# Patient Record
Sex: Female | Born: 1955 | Race: White | Hispanic: No | Smoking: Never smoker
Health system: Southern US, Community
[De-identification: ages and names within clinical notes are randomized; demographics above are authoritative.]

## PROBLEM LIST (undated history)

## (undated) DIAGNOSIS — M7072 Other bursitis of hip, left hip: Secondary | ICD-10-CM

## (undated) DIAGNOSIS — D219 Benign neoplasm of connective and other soft tissue, unspecified: Secondary | ICD-10-CM

## (undated) DIAGNOSIS — I493 Ventricular premature depolarization: Secondary | ICD-10-CM

## (undated) DIAGNOSIS — C8590 Non-Hodgkin lymphoma, unspecified, unspecified site: Secondary | ICD-10-CM

## (undated) DIAGNOSIS — K219 Gastro-esophageal reflux disease without esophagitis: Secondary | ICD-10-CM

## (undated) DIAGNOSIS — G4730 Sleep apnea, unspecified: Secondary | ICD-10-CM

## (undated) DIAGNOSIS — T7840XA Allergy, unspecified, initial encounter: Secondary | ICD-10-CM

## (undated) HISTORY — DX: Gastro-esophageal reflux disease without esophagitis: K21.9

## (undated) HISTORY — DX: Sleep apnea, unspecified: G47.30

## (undated) HISTORY — PX: WISDOM TOOTH EXTRACTION: SHX21

## (undated) HISTORY — DX: Ventricular premature depolarization: I49.3

## (undated) HISTORY — DX: Benign neoplasm of connective and other soft tissue, unspecified: D21.9

## (undated) HISTORY — DX: Other bursitis of hip, left hip: M70.72

## (undated) HISTORY — DX: Allergy, unspecified, initial encounter: T78.40XA

---

## 1997-12-20 ENCOUNTER — Ambulatory Visit (HOSPITAL_COMMUNITY): Admission: RE | Admit: 1997-12-20 | Discharge: 1997-12-20 | Payer: Self-pay | Admitting: Gastroenterology

## 1998-05-15 HISTORY — PX: TOTAL ABDOMINAL HYSTERECTOMY: SHX209

## 1998-05-28 ENCOUNTER — Inpatient Hospital Stay (HOSPITAL_COMMUNITY): Admission: RE | Admit: 1998-05-28 | Discharge: 1998-05-30 | Payer: Self-pay | Admitting: Obstetrics and Gynecology

## 1999-02-03 ENCOUNTER — Other Ambulatory Visit: Admission: RE | Admit: 1999-02-03 | Discharge: 1999-02-03 | Payer: Self-pay | Admitting: Family Medicine

## 1999-12-29 ENCOUNTER — Other Ambulatory Visit: Admission: RE | Admit: 1999-12-29 | Discharge: 1999-12-29 | Payer: Self-pay | Admitting: *Deleted

## 1999-12-29 ENCOUNTER — Encounter (INDEPENDENT_AMBULATORY_CARE_PROVIDER_SITE_OTHER): Payer: Self-pay

## 2003-08-14 ENCOUNTER — Encounter: Admission: RE | Admit: 2003-08-14 | Discharge: 2003-08-14 | Payer: Self-pay | Admitting: Family Medicine

## 2005-01-28 ENCOUNTER — Encounter (INDEPENDENT_AMBULATORY_CARE_PROVIDER_SITE_OTHER): Payer: Self-pay | Admitting: Specialist

## 2005-01-28 ENCOUNTER — Ambulatory Visit (HOSPITAL_COMMUNITY): Admission: RE | Admit: 2005-01-28 | Discharge: 2005-01-28 | Payer: Self-pay | Admitting: Gastroenterology

## 2006-03-15 ENCOUNTER — Ambulatory Visit: Payer: Self-pay | Admitting: Family Medicine

## 2006-04-27 ENCOUNTER — Ambulatory Visit: Payer: Self-pay | Admitting: Family Medicine

## 2006-09-20 ENCOUNTER — Ambulatory Visit: Payer: Self-pay | Admitting: Family Medicine

## 2006-10-04 ENCOUNTER — Ambulatory Visit: Payer: Self-pay | Admitting: Family Medicine

## 2006-10-18 ENCOUNTER — Ambulatory Visit: Payer: Self-pay

## 2007-04-13 ENCOUNTER — Ambulatory Visit: Payer: Self-pay | Admitting: Family Medicine

## 2007-05-11 HISTORY — PX: OTHER SURGICAL HISTORY: SHX169

## 2008-06-11 ENCOUNTER — Ambulatory Visit: Payer: Self-pay | Admitting: Family Medicine

## 2008-07-03 ENCOUNTER — Ambulatory Visit: Payer: Self-pay | Admitting: Family Medicine

## 2009-08-12 ENCOUNTER — Ambulatory Visit: Payer: Self-pay | Admitting: Physician Assistant

## 2009-10-14 ENCOUNTER — Ambulatory Visit: Payer: Self-pay | Admitting: Family Medicine

## 2010-01-17 ENCOUNTER — Ambulatory Visit: Payer: Self-pay | Admitting: Family Medicine

## 2010-03-11 ENCOUNTER — Ambulatory Visit: Payer: Self-pay | Admitting: Family Medicine

## 2010-04-09 ENCOUNTER — Ambulatory Visit: Payer: Self-pay | Admitting: Family Medicine

## 2010-08-29 ENCOUNTER — Ambulatory Visit
Admission: RE | Admit: 2010-08-29 | Discharge: 2010-08-29 | Payer: Self-pay | Source: Home / Self Care | Attending: Family Medicine | Admitting: Family Medicine

## 2010-12-23 ENCOUNTER — Telehealth: Payer: Self-pay | Admitting: Family Medicine

## 2010-12-23 MED ORDER — ZOLPIDEM TARTRATE 10 MG PO TABS: 10.0000 mg | ORAL_TABLET | Freq: Every evening | ORAL | Status: DC | PRN

## 2010-12-23 NOTE — Telephone Encounter (Signed)
ambien given for sleep

## 2010-12-24 ENCOUNTER — Telehealth: Payer: Self-pay | Admitting: Family Medicine

## 2010-12-25 NOTE — Telephone Encounter (Signed)
Phone pt of Ambien refill.

## 2010-12-26 NOTE — Op Note (Signed)
[name], Denise [name]             ACCOUNT NO.:  [id number]   MEDICAL RECORD NO.:  [id number]          PATIENT TYPE:  AMB   LOCATION:  ENDO                         FACILITY:  [facility]   PHYSICIAN:  Anselmo Rod, M.D.  DATE OF BIRTH:  1956/02/07   DATE OF PROCEDURE:  01/28/2005  DATE OF DISCHARGE:                                 OPERATIVE REPORT   PROCEDURE PERFORMED:  Colonoscopy with cold biopsies times four.   ENDOSCOPIST:  Charna Elizabeth, M.D.   INSTRUMENT USED:  Olympus video colonoscope.   INDICATIONS FOR PROCEDURE:  The patient is a 55 year old white female with a  history of colon cancer in her family undergoing a screening colonoscopy to  rule out colonic polyps, masses, etc.   PREPROCEDURE PREPARATION:  Informed consent was procured from the patient.  The patient was fasted for eight hours prior to the procedure and prepped  with a bottle of magnesium citrate and a gallon of GoLYTELY the night prior  to the procedure.  The risks and benefits of the procedure including a 10%  miss rate for colon polyps or cancers was discussed with the patient as  well.  A gram of Ancef was given for prophylaxis purposes.   PREPROCEDURE PHYSICAL:  The patient had stable vital signs.  Neck supple.  Chest clear to auscultation.  S1 and S2 regular.  Abdomen soft with normal  bowel sounds.   DESCRIPTION OF PROCEDURE:  The patient was placed in left lateral decubitus  position and sedated with 70 mg of Demerol and 7 mg of Versed in slow  incremental doses.  Once the patient was adequately sedated and maintained  on low flow oxygen and continuous cardiac monitoring, the Olympus video  colonoscope was advanced from the rectum.  The appendicular orifice and  ileocecal valve were clearly visualized and photographed.  No masses,  polyps, erosions, ulcerations or diverticula were seen.  The terminal ileum  appeared healthy and without lesions.  A small superficial ulceration was  seen over the  ileocecal valve that was biopsied for pathology.  This may be  related to nonsteroidal injury.  Cold biopsies times four were done.  Retroflexion in the rectum revealed no abnormalities.  The patient tolerated  the procedure well without complication.   IMPRESSION:  Normal colonoscopy up to the terminal ileum except for a small  superficial ulcer over the ileocecal valve, biopsies done, results pending,  question nonsteroidal injury.   RECOMMENDATIONS:  1.  A repeat colonoscopy has been recommended in the next five years unless      the biopsies reveal any abnormalities with regard to adenomatous change      or dysplasia.  2.  Await pathology results.  3.  Avoid all nonsteroidals for the next two weeks.  4.  Outpatient followup as need arises in the future.       JNM/MEDQ  D:  01/28/2005  T:  01/28/2005  Job:  [id number]   cc:   Sharlot Gowda, M.D.  [address]  [place], [place] [address]  Fax: [phone]   Laqueta Linden, M.D.  [address]  [place]  [place]  [place] [address]  Fax: [phone]

## 2010-12-31 ENCOUNTER — Telehealth: Payer: Self-pay | Admitting: Family Medicine

## 2010-12-31 NOTE — Telephone Encounter (Signed)
Faxed refill out  

## 2011-02-16 ENCOUNTER — Other Ambulatory Visit: Payer: Self-pay | Admitting: Family Medicine

## 2011-03-06 ENCOUNTER — Encounter: Payer: Self-pay | Admitting: Family Medicine

## 2011-04-02 ENCOUNTER — Ambulatory Visit (INDEPENDENT_AMBULATORY_CARE_PROVIDER_SITE_OTHER): Payer: BC Managed Care – PPO | Admitting: Family Medicine

## 2011-04-02 ENCOUNTER — Encounter: Payer: Self-pay | Admitting: Family Medicine

## 2011-04-02 VITALS — BP 116/76 | HR 60

## 2011-04-02 DIAGNOSIS — L089 Local infection of the skin and subcutaneous tissue, unspecified: Secondary | ICD-10-CM

## 2011-04-02 DIAGNOSIS — W540XXA Bitten by dog, initial encounter: Secondary | ICD-10-CM

## 2011-04-02 DIAGNOSIS — T07XXXA Unspecified multiple injuries, initial encounter: Secondary | ICD-10-CM

## 2011-04-02 DIAGNOSIS — T148XXA Other injury of unspecified body region, initial encounter: Secondary | ICD-10-CM

## 2011-04-02 DIAGNOSIS — Z23 Encounter for immunization: Secondary | ICD-10-CM

## 2011-04-02 MED ORDER — AMOXICILLIN-POT CLAVULANATE 875-125 MG PO TABS: 1.0000 | ORAL_TABLET | Freq: Two times a day (BID) | ORAL | Status: AC

## 2011-04-02 NOTE — Patient Instructions (Signed)
Take the antibiotic. If you're thumb gets worse with more swelling and pain, give me a call

## 2011-04-02 NOTE — Progress Notes (Signed)
  Subjective:    Patient ID: Denise [name], female    DOB: [birth year], 55 y.o.   MRN: [id number]  HPI She was bitten by a friend of hers a dog last night and sustained laceration to the base of the left thumb on the palmar surface. The dog apparently does have his shots.  Review of Systems     Objective:   Physical Exam 3 puncture wounds are noted, one on the palmar surface into on the dorsal surface of her thumb. It is slightly erythematous but not warm and minimally tender.       Assessment & Plan:  Dog bite to thumb. I will give T. gallop and place her on Augmentin. Discussed possible problems with an infection on the palmar surface of the thumb. She will call if the symptoms worsen. Flu shot also given

## 2011-08-20 ENCOUNTER — Encounter: Payer: Self-pay | Admitting: Family Medicine

## 2011-08-20 ENCOUNTER — Ambulatory Visit (INDEPENDENT_AMBULATORY_CARE_PROVIDER_SITE_OTHER): Payer: BC Managed Care – PPO | Admitting: Family Medicine

## 2011-08-20 ENCOUNTER — Ambulatory Visit
Admission: RE | Admit: 2011-08-20 | Discharge: 2011-08-20 | Disposition: A | Discharge status: Home / Self Care | Payer: BC Managed Care – PPO | Source: Ambulatory Visit | Attending: Family Medicine | Admitting: Family Medicine

## 2011-08-20 DIAGNOSIS — M25559 Pain in unspecified hip: Secondary | ICD-10-CM

## 2011-08-20 DIAGNOSIS — M25552 Pain in left hip: Secondary | ICD-10-CM

## 2011-08-20 DIAGNOSIS — I4949 Other premature depolarization: Secondary | ICD-10-CM

## 2011-08-20 DIAGNOSIS — I493 Ventricular premature depolarization: Secondary | ICD-10-CM

## 2011-08-20 DIAGNOSIS — J309 Allergic rhinitis, unspecified: Secondary | ICD-10-CM

## 2011-08-20 DIAGNOSIS — Z79899 Other long term (current) drug therapy: Secondary | ICD-10-CM

## 2011-08-20 DIAGNOSIS — G4730 Sleep apnea, unspecified: Secondary | ICD-10-CM

## 2011-08-20 DIAGNOSIS — J45909 Unspecified asthma, uncomplicated: Secondary | ICD-10-CM

## 2011-08-20 DIAGNOSIS — K219 Gastro-esophageal reflux disease without esophagitis: Secondary | ICD-10-CM

## 2011-08-20 MED ORDER — ALBUTEROL SULFATE (2.5 MG/3ML) 0.083% IN NEBU: 2.5000 mg | INHALATION_SOLUTION | Freq: Four times a day (QID) | RESPIRATORY_TRACT | Status: DC | PRN

## 2011-08-20 MED ORDER — ESOMEPRAZOLE MAGNESIUM 40 MG PO CPDR: 40.0000 mg | DELAYED_RELEASE_CAPSULE | Freq: Every day | ORAL | Status: DC

## 2011-08-20 NOTE — Progress Notes (Signed)
  Subjective:    Patient ID: Denise [name], female    DOB: 06-04-1956, 56 y.o.   MRN: [id number]  HPI She is here for medication recheck. She does have reflux disease. She has tried several H2 blockers with minimal success and finds good results with Nexium. She gets this in [place]. She also has underlying allergies and presently is getting shots. She notes that it also helps with her asthma. She uses Proventil approximately twice per month. 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 She has no difficulty with that. She continues on CPAP and has not had a read out. She does complain of intermittent left hip pain and sometimes causes her to limp. 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 She has several more years before she can consider retiring. She is in a long-term relationship   Review of Systems Negative except as above    Objective:   Physical Exam Alert and in no distress. No tenderness to palpation over the left greater trochanter. Some pain on internal rotation of the hip. External rotation was nonpainful.       Assessment & Plan:   1. Left hip pain  DG Hip Complete Left  2. GERD (gastroesophageal reflux disease)    3. Asthma    4. Allergic rhinitis    5. Sleep apnea    6. PVC's (premature ventricular contractions)  CBC with Differential, Comprehensive metabolic panel, Lipid panel  7. Encounter for long-term (current) use of other medications  CBC with Differential, Comprehensive metabolic panel, Lipid panel   prescription written for Proventil and Nexium that she will send to [place]. Recommend she find out from her insurance whether a different PPI will be covered. Will also get a read out.

## 2011-08-20 NOTE — Patient Instructions (Signed)
I will call you with the results of the x-ray and what the results of the CPAP readout

## 2011-08-21 LAB — CBC WITH DIFFERENTIAL/PLATELET
Basophils Absolute: 0 10*3/uL (ref 0.0–0.1)
Eosinophils Relative: 3 % (ref 0–5)
HCT: 41.8 % (ref 36.0–46.0)
Lymphocytes Relative: 33 % (ref 12–46)
MCV: 84.1 fL (ref 78.0–100.0)
Monocytes Absolute: 0.5 10*3/uL (ref 0.1–1.0)
RDW: 13.8 % (ref 11.5–15.5)
WBC: 6.9 10*3/uL (ref 4.0–10.5)

## 2011-08-21 LAB — LIPID PANEL
Cholesterol: 185 mg/dL (ref 0–200)
HDL: 53 mg/dL (ref >39)
Total CHOL/HDL Ratio: 3.5 Ratio

## 2011-08-21 LAB — COMPREHENSIVE METABOLIC PANEL
BUN: 12 mg/dL (ref 6–23)
CO2: 28 mEq/L (ref 19–32)
Calcium: 9.2 mg/dL (ref 8.4–10.5)
Chloride: 103 mEq/L (ref 96–112)
Creat: 0.67 mg/dL (ref 0.50–1.10)

## 2011-08-25 ENCOUNTER — Telehealth: Payer: Self-pay | Admitting: Family Medicine

## 2011-08-25 MED ORDER — ZOLPIDEM TARTRATE 10 MG PO TABS: 10.0000 mg | ORAL_TABLET | Freq: Every evening | ORAL | Status: DC | PRN

## 2011-08-25 MED ORDER — MELOXICAM 15 MG PO TABS: 15.0000 mg | ORAL_TABLET | Freq: Every day | ORAL | Status: DC

## 2011-08-25 MED ORDER — ATENOLOL 50 MG PO TABS: 50.0000 mg | ORAL_TABLET | Freq: Every day | ORAL | Status: DC

## 2011-08-25 NOTE — Telephone Encounter (Signed)
Called in [facility] and refilled meds

## 2011-08-25 NOTE — Telephone Encounter (Signed)
Renew all of these medications. Give her Ambien with a refill as per orders written

## 2011-09-01 ENCOUNTER — Other Ambulatory Visit: Payer: Self-pay | Admitting: Family Medicine

## 2012-01-06 ENCOUNTER — Other Ambulatory Visit: Payer: Self-pay | Admitting: Family Medicine

## 2012-01-06 NOTE — Telephone Encounter (Signed)
Is this ok?

## 2012-02-03 ENCOUNTER — Encounter: Payer: Self-pay | Admitting: Internal Medicine

## 2012-04-26 ENCOUNTER — Telehealth: Payer: Self-pay | Admitting: Internal Medicine

## 2012-04-26 NOTE — Telephone Encounter (Signed)
Pt had a change in med to atenolol

## 2012-05-03 ENCOUNTER — Telehealth: Payer: Self-pay | Admitting: Family Medicine

## 2012-05-03 MED ORDER — HYDROCHLOROTHIAZIDE 12.5 MG PO TABS: 12.5000 mg | ORAL_TABLET | Freq: Every day | ORAL | Status: DC

## 2012-05-03 NOTE — Telephone Encounter (Signed)
I SPOKE WITH THIS PATIENT AND I SENT IN A 30 DAY SUPPLY ON THE MEDICATION. CLS

## 2012-06-21 ENCOUNTER — Other Ambulatory Visit: Payer: Self-pay | Admitting: Family Medicine

## 2012-06-29 ENCOUNTER — Other Ambulatory Visit: Payer: Self-pay | Admitting: Family Medicine

## 2012-08-13 ENCOUNTER — Ambulatory Visit (INDEPENDENT_AMBULATORY_CARE_PROVIDER_SITE_OTHER): Payer: BC Managed Care – PPO | Admitting: Radiology

## 2012-08-13 DIAGNOSIS — Z23 Encounter for immunization: Secondary | ICD-10-CM

## 2012-08-22 ENCOUNTER — Other Ambulatory Visit: Payer: Self-pay | Admitting: Family Medicine

## 2012-09-02 ENCOUNTER — Ambulatory Visit (INDEPENDENT_AMBULATORY_CARE_PROVIDER_SITE_OTHER): Payer: BC Managed Care – PPO | Admitting: Family Medicine

## 2012-09-02 ENCOUNTER — Encounter: Payer: Self-pay | Admitting: Family Medicine

## 2012-09-02 VITALS — BP 120/80 | HR 70 | Wt 209.0 lb

## 2012-09-02 DIAGNOSIS — J45909 Unspecified asthma, uncomplicated: Secondary | ICD-10-CM

## 2012-09-02 DIAGNOSIS — K219 Gastro-esophageal reflux disease without esophagitis: Secondary | ICD-10-CM

## 2012-09-02 DIAGNOSIS — G4730 Sleep apnea, unspecified: Secondary | ICD-10-CM

## 2012-09-02 DIAGNOSIS — J309 Allergic rhinitis, unspecified: Secondary | ICD-10-CM

## 2012-09-02 MED ORDER — MOMETASONE FUROATE 110 MCG/INH IN AEPB: 1.0000 "application " | INHALATION_SPRAY | RESPIRATORY_TRACT | Status: DC

## 2012-09-02 MED ORDER — FLUTICASONE PROPIONATE 50 MCG/ACT NA SUSP: 2.0000 | Freq: Every day | NASAL | Status: DC

## 2012-09-02 MED ORDER — ESOMEPRAZOLE MAGNESIUM 40 MG PO CPDR: 40.0000 mg | DELAYED_RELEASE_CAPSULE | Freq: Every day | ORAL | Status: DC

## 2012-09-02 NOTE — Patient Instructions (Signed)
Use this medicine in the spring and fall when you have problems but you don't really need to use it regularly

## 2012-09-02 NOTE — Progress Notes (Signed)
  Subjective:    Patient ID: Denise [name], female    DOB: 08-30-1955, 57 y.o.   MRN: [id number]  HPI She is here for an interval evaluation. She does have underlying sleep apnea and does have an old machine the cannot get a read out. She would like to be able to get a new machine. She also has asthma but does intermittently use an inhaler. She mainly has trouble when she gets cold symptoms, change of season also in the spring and fall. She does have underlying allergies and does have some nasal congestion. She has used fluticasone in the past. She also has reflux symptoms and his Nexium usually out of [place]. She would like to fill on this. She works in the school system. She is in a 3 year relationship which is going quite well. In general her life is going for her well. Alert and in no distress otherwise not examined   Review of Systems     Objective:   Physical Exam        Assessment & Plan:   1. Sleep apnea    2. Asthma  Mometasone Furoate (ASMANEX 30 METERED DOSES) 110 MCG/INH AEPB  3. Allergic rhinitis  fluticasone (FLONASE) 50 MCG/ACT nasal spray  4. GERD (gastroesophageal reflux disease)  esomeprazole (NEXIUM) 40 MG capsule   will write for her to get a CPAP machine and supplies. She is to have it set up on auto CPAP. Recommend she use Asmanex usually in the spring and fall. She is to use the albuterol on an as-needed basis but. Recommend she use Flonase to help with her nasal symptoms. Will write for exam to be obtained from [place].

## 2012-09-29 ENCOUNTER — Telehealth: Payer: Self-pay | Admitting: Family Medicine

## 2012-09-29 ENCOUNTER — Other Ambulatory Visit: Payer: Self-pay

## 2012-09-29 MED ORDER — TRIAZOLAM 0.25 MG PO TABS: 0.2500 mg | ORAL_TABLET | Freq: Every evening | ORAL | Status: DC | PRN

## 2012-09-29 NOTE — Telephone Encounter (Signed)
Let her know that I called in a different medication. Call this one in for her.

## 2012-09-29 NOTE — Telephone Encounter (Signed)
Pt states she has never taken a full pill

## 2012-09-29 NOTE — Telephone Encounter (Signed)
Called med in per jcl and pt advised

## 2012-09-29 NOTE — Telephone Encounter (Signed)
Have her try one half a pill to see if this will help with sleep but not cause memory issues otherwise I will call something different

## 2012-09-29 NOTE — Telephone Encounter (Signed)
She would like to be switched to a different sleep med since Ambien causes memory loss.I will switch her to Halcion .

## 2012-10-04 ENCOUNTER — Encounter: Payer: Self-pay | Admitting: Family Medicine

## 2012-10-11 ENCOUNTER — Telehealth: Payer: Self-pay

## 2012-10-11 NOTE — Telephone Encounter (Signed)
Called pt she said to let you know she is juicing 2 meals a day and normal meal at dinner she said she knew what you were going to say she really likes you but she doesn't want to come in I told her of her labs she said all he is going to say is exercise and change diet .

## 2012-10-15 ENCOUNTER — Other Ambulatory Visit: Payer: Self-pay | Admitting: Family Medicine

## 2012-12-08 ENCOUNTER — Telehealth: Payer: Self-pay | Admitting: Nurse Practitioner

## 2012-12-08 NOTE — Telephone Encounter (Signed)
Letter of necessity needed for hormone patch/patient forgets name of rx/please fax letter to patient at [phone].

## 2012-12-09 NOTE — Telephone Encounter (Signed)
Patient called with different fax for letter of medical necessity to new fax number: [phone].

## 2012-12-09 NOTE — Telephone Encounter (Signed)
Spoke with pt about her request for a letter of medical necessity for Vivelle Dot Rx. Pt. Says medication to costly at local pharmacy, pt states she gets her medication from [place]. I explained to her that Alexia Freestone was out of the office today, so I spoke with my Clinical Supervisor(Sally) and she said we would not be able to write this letter for her. Patients response was why not! Everybody does it. Then she went on to say she would like to speak with Kennon Rounds if not today Monday will be ok.

## 2012-12-12 ENCOUNTER — Telehealth: Payer: Self-pay | Admitting: Nurse Practitioner

## 2012-12-12 NOTE — Telephone Encounter (Signed)
Follow up phone call to patient regarding [facility] use.  Advised that are office policy implemented by our physicians and supported by Alexia Freestone does not support the use of non-[place] pharmacy companies.  Quality assessment measures and FDA approval are not  In place for such medications and therefore we will not support their use.  Offered to assist patient in obtaining a lower cost option or least expensive pharmacy and advised that Vivelle has recently become available in generic.  Patient declined and states it is just so much cheaper to use the [facility] pharmacy.  She asked if we would have written letter if we had not known what it was for.  Advised that although we write Rx and dont necessarily know where she fills it, a LOM only written based on who it is to and generally that would be the insurance company.  Again, advised out office does not recommend/support the use of [facility].

## 2012-12-12 NOTE — Telephone Encounter (Signed)
Kennon Rounds this was sent to my basket and most likely to you as well.  But as you know we don't support use of out of country pharmacy.

## 2012-12-12 NOTE — Telephone Encounter (Signed)
Patient has a HSA account and wants reimbursement for her medication.  Needs to have diagnosis and Name of medication only.  She is aware that we do not advise [facility], but this is not a note for them - only her HSA account at work. Note is hand written for her HSA with diagnosis of menopause.

## 2012-12-12 NOTE — Telephone Encounter (Signed)
Patient is asking to talk with PG herself if possible. Patient did not give any details.

## 2013-01-11 ENCOUNTER — Encounter: Payer: Self-pay | Admitting: Medical

## 2013-01-11 ENCOUNTER — Ambulatory Visit (INDEPENDENT_AMBULATORY_CARE_PROVIDER_SITE_OTHER): Payer: BC Managed Care – PPO | Admitting: Medical

## 2013-01-11 VITALS — BP 110/70 | HR 58 | Temp 97.8°F | Resp 16 | Wt 205.0 lb

## 2013-01-11 DIAGNOSIS — M722 Plantar fascial fibromatosis: Secondary | ICD-10-CM

## 2013-01-11 DIAGNOSIS — J329 Chronic sinusitis, unspecified: Secondary | ICD-10-CM

## 2013-01-11 MED ORDER — AZITHROMYCIN 250 MG PO TABS: ORAL_TABLET | ORAL | Status: DC

## 2013-01-11 NOTE — Progress Notes (Signed)
Subjective:  Denise [name] is a 57 y.o. female who presents for possible sinusitis.  8 days ago started having sneezing, sore throat, productive cough, LGF, sinus pressure, fullness and popping in ears, worsening, some nausea.  Past history is significant for asthma. Patient is a non-smoker.  Using Zycam, Mucinex, Nyquil for symptoms.  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   Denies vomiting and diarrhea, on SOB, wheezing.  No other aggravating or relieving factors.    She notes hx/o plantar fascitis, bilat.  Yearly for the last few years has gotten molded orthotics that really seems to help her feet.  Has to have yearly prescription.  Requests the script today.  Dr. Susann [name] has prescribed prior.   Gets the orthotics made from company on [facility].    No other c/o.    Past Medical History  Diagnosis Date  . Asthma   . Allergy   . GERD (gastroesophageal reflux disease)   . Sleep apnea   . PVC's (premature ventricular contractions)    ROS as in subjective  Objective: Filed Vitals:   01/11/13 1603  BP: 110/70  Pulse: 58  Temp: 97.8 F (36.6 C)  Resp: 16    General appearance: Alert, WD/WN, no distress                             Skin: warm, no rash                           Head: +maxillary sinus tenderness                            Eyes: conjunctiva normal, corneas clear, PERRLA                            Ears: serous effusion behind both TMs, external ear canals normal                          Nose: septum midline, turbinates swollen, with erythema and clear discharge             Mouth/throat: MMM, tongue normal, mild pharyngeal erythema                           Neck: supple, no adenopathy, no thyromegaly, nontender                          Heart: RRR, normal S1, S2, no murmurs                         Lungs: CTA bilaterally, no wheezes, rales, or rhonchi   MSK: feet without deformity, nontender, normal toe and ankle ROM, no foot lesions LE neurovascularly  intact   Assessment and Plan:   Encounter Diagnoses  Name Primary?  . Sinusitis Yes  . Plantar fasciitis     Prescription given for Zpak, can c/t symptomatic relief, nasal saline, and call or return if worse or not improving in 2-3 days.    Plantar fascitis - scripts for orthotics as this seems to be a good solution for her

## 2013-01-18 ENCOUNTER — Other Ambulatory Visit: Payer: Self-pay | Admitting: Family Medicine

## 2013-02-20 ENCOUNTER — Encounter: Payer: Self-pay | Admitting: Internal Medicine

## 2013-03-02 ENCOUNTER — Telehealth: Payer: Self-pay | Admitting: Family Medicine

## 2013-03-02 NOTE — Telephone Encounter (Signed)
There is no documentation in computer from GI (no notes or OV's in system or scanned that I can find).  I don't see a visit where this was discussed with Dr. Susann Givens, so I can't really answer her question.  She should ask her GI doctor, or discuss with Dr. Susann Givens, if he was the one who referred her to GI or made any recommendations for her to have these studies.

## 2013-03-02 NOTE — Telephone Encounter (Signed)
Left message on patient's vm explaining Dr.Knapp's response to her phonecall.

## 2013-04-24 ENCOUNTER — Encounter: Payer: Self-pay | Admitting: Family Medicine

## 2013-05-01 ENCOUNTER — Other Ambulatory Visit: Payer: Self-pay | Admitting: Family Medicine

## 2013-06-15 ENCOUNTER — Ambulatory Visit: Payer: BC Managed Care – PPO | Admitting: Family Medicine

## 2013-07-03 ENCOUNTER — Telehealth: Payer: Self-pay

## 2013-07-03 NOTE — Telephone Encounter (Signed)
Pt was notified and will continue same med

## 2013-07-03 NOTE — Telephone Encounter (Signed)
Tell her that unfortunately all anti-inflammatory medications can cause fluid retention. The only option would be Tylenol. Tell her we can try a different one but she still might have the same problem.

## 2013-07-03 NOTE — Telephone Encounter (Signed)
PATIENT LEFT MESSAGE ASKING IF YOU WOULD PLEASE CHANGE HER MEDICINE FOR BURSITIS THE MED YOU GAVE HER MAKES HER ANKLE SWELL SHE SAID HOW SHE KNOW BECAUSE SHE STOPPED MED AND ANKLE WENT DOWN PLEASE ADVISE

## 2013-09-04 ENCOUNTER — Encounter: Payer: Self-pay | Admitting: Nurse Practitioner

## 2013-09-04 ENCOUNTER — Encounter: Payer: BC Managed Care – PPO | Admitting: Family Medicine

## 2013-09-04 ENCOUNTER — Ambulatory Visit (INDEPENDENT_AMBULATORY_CARE_PROVIDER_SITE_OTHER): Payer: BC Managed Care – PPO | Admitting: Nurse Practitioner

## 2013-09-04 ENCOUNTER — Other Ambulatory Visit: Payer: Self-pay | Admitting: Nurse Practitioner

## 2013-09-04 VITALS — BP 120/76 | HR 52 | Ht 64.25 in | Wt 208.0 lb

## 2013-09-04 DIAGNOSIS — Z0000 Encounter for general adult medical examination without abnormal findings: Secondary | ICD-10-CM

## 2013-09-04 DIAGNOSIS — Z01419 Encounter for gynecological examination (general) (routine) without abnormal findings: Secondary | ICD-10-CM

## 2013-09-04 LAB — COMPREHENSIVE METABOLIC PANEL
ALK PHOS: 84 U/L (ref 39–117)
ALT: 17 U/L (ref 0–35)
AST: 18 U/L (ref 0–37)
Albumin: 4.4 g/dL (ref 3.5–5.2)
BILIRUBIN TOTAL: 0.5 mg/dL (ref 0.3–1.2)
BUN: 14 mg/dL (ref 6–23)
CO2: 30 meq/L (ref 19–32)
Calcium: 9.2 mg/dL (ref 8.4–10.5)
Chloride: 101 mEq/L (ref 96–112)
Creat: 0.66 mg/dL (ref 0.50–1.10)
Glucose, Bld: 107 mg/dL — ABNORMAL HIGH (ref 70–99)
Potassium: 4.4 mEq/L (ref 3.5–5.3)
SODIUM: 139 meq/L (ref 135–145)
TOTAL PROTEIN: 6.8 g/dL (ref 6.0–8.3)

## 2013-09-04 LAB — LIPID PANEL
CHOL/HDL RATIO: 3.4 ratio
Cholesterol: 199 mg/dL (ref 0–200)
HDL: 58 mg/dL (ref >39)
LDL CALC: 116 mg/dL — AB (ref 0–99)
Triglycerides: 125 mg/dL (ref <150)
VLDL: 25 mg/dL (ref 0–40)

## 2013-09-04 LAB — POCT URINALYSIS DIPSTICK
Bilirubin, UA: NEGATIVE
Glucose, UA: NEGATIVE
Ketones, UA: NEGATIVE
Leukocytes, UA: NEGATIVE
NITRITE UA: NEGATIVE
PH UA: 6
Protein, UA: NEGATIVE
RBC UA: NEGATIVE
Urobilinogen, UA: NEGATIVE

## 2013-09-04 LAB — TSH: TSH: 1.808 u[IU]/mL (ref 0.350–4.500)

## 2013-09-04 LAB — HEMOGLOBIN, FINGERSTICK: HEMOGLOBIN, FINGERSTICK: 13.2 g/dL (ref 12.0–16.0)

## 2013-09-04 NOTE — Patient Instructions (Signed)

## 2013-09-04 NOTE — Progress Notes (Signed)
Patient ID: Denise [name], female   DOB: 1956-04-13, 58 y.o.   MRN: [id number] 58 y.o. G0P0 Single Caucasian Fe here for annual exam.  Still having pain in left hip from bursitis. Tapered Vivelle dot down to weekly then went off in mid December 2014.   Now feels well without vaso symptoms.  Mood and sleep if OK.  Patient partner is turning 8 this year and they may go to [place].  No LMP recorded. Patient has had a hysterectomy.          Sexually active: yes  The current method of family planning is none.   Same sex partner. Exercising: yes  climbing stairs Smoker:  no  Health Maintenance: Pap: Hyst 1999 MMG:  02/20/13, Bi-Rads 2: benign findings Colonoscopy:  01/2013, Dr. Ferdinand Lango in [place] BMD:  02/02/11, 0.3/0.0/0.7 TDaP:  2003 Labs:  HB:  13.2  Urine:  Negative    reports that she has never smoked. She has never used smokeless tobacco.  Past Medical History  Diagnosis Date  . Asthma   . Allergy   . GERD (gastroesophageal reflux disease)   . Sleep apnea   . PVC's (premature ventricular contractions)   . Fibroids     TAH in 1999  . Bursitis of left hip     Past Surgical History  Procedure Laterality Date  . Cardiac cath  05/2007    J. BERRY  . Total abdominal hysterectomy  05/1998    secondary to fibroids  . Wisdom tooth extraction  mid teens    Current Outpatient Prescriptions  Medication Sig Dispense Refill  . albuterol (PROVENTIL) (2.5 MG/3ML) 0.083% nebulizer solution Take 3 mLs (2.5 mg total) by nebulization every 6 (six) hours as needed.  75 mL  1  . atenolol (TENORMIN) 50 MG tablet TAKE 1 TABLET BY MOUTH TWICE A DAY  60 tablet  9  . esomeprazole (NEXIUM) 40 MG capsule Take 1 capsule (40 mg total) by mouth daily before breakfast.  90 capsule  3  . fluticasone (FLONASE) 50 MCG/ACT nasal spray Place 2 sprays into the nose daily.  16 g  6  . hydrochlorothiazide (MICROZIDE) 12.5 MG capsule TAKE ONE CAPSULE BY MOUTH EVERY DAY  30 capsule  6  . meloxicam (MOBIC) 15 MG tablet  Take 15 mg by mouth daily.      . Mometasone Furoate (ASMANEX 30 METERED DOSES) 110 MCG/INH AEPB Inhale 1 application into the lungs 1 day or 1 dose.  1 Inhaler  11  . triazolam (HALCION) 0.25 MG tablet Take 1 tablet (0.25 mg total) by mouth at bedtime as needed.  30 tablet  0  . zolpidem (AMBIEN) 10 MG tablet Take 1 tablet (10 mg total) by mouth at bedtime as needed.  30 tablet  1   No current facility-administered medications for this visit.    Family History  Problem Relation Age of Onset  . Cancer Maternal Grandmother     GI cancer in 27's  . Dementia Mother   . Heart disease Father   . Heart failure Father     ROS:  Pertinent items are noted in HPI.  Otherwise, a comprehensive ROS was negative.  Exam:   BP 120/76  Pulse 52  Ht 5' 4.25" (1.632 m)  Wt 208 lb (94.348 kg)  BMI 35.42 kg/m2 Height: 5' 4.25" (163.2 cm)  Ht Readings from Last 3 Encounters:  09/04/13 5' 4.25" (1.632 m)  08/20/11 5\' 5"  (1.651 m)    General appearance: alert,  cooperative and appears stated age Head: Normocephalic, without obvious abnormality, atraumatic Neck: no adenopathy, supple, symmetrical, trachea midline and thyroid normal to inspection and palpation Lungs: clear to auscultation bilaterally Breasts: normal appearance, no masses or tenderness Heart: regular rate and rhythm Abdomen: soft, non-tender; no masses,  no organomegaly Extremities: extremities normal, atraumatic, no cyanosis or edema Skin: Skin color, texture, turgor normal. No rashes or lesions Lymph nodes: Cervical, supraclavicular, and axillary nodes normal. No abnormal inguinal nodes palpated Neurologic: Grossly normal   Pelvic: External genitalia:  no lesions              Urethra:  normal appearing urethra with no masses, tenderness or lesions              Bartholin's and Skene's: normal                 Vagina: normal appearing vagina with normal color and discharge, no lesions              Cervix: absent              Pap  taken: no Bimanual Exam:  Uterus:  uterus absent              Adnexa: no mass, fullness, tenderness               Rectovaginal: Confirms               Anus:  normal sphincter tone, no lesions  A:  Well Woman with normal exam  S/P hysterectomy secondary to fibroids 1999  Postmenopausal on ERT from 05/2007 - 07/2013  HTN  P:   Pap smear as per guidelines   Mammogram due 02/2014  Will flow with labs  Will try to get ROI from GI in [place] about colonoscopy  Counseled on breast self exam, mammography screening, adequate intake of calcium and vitamin D, diet and exercise return annually or prn  An After Visit Summary was printed and given to the patient.

## 2013-09-05 LAB — VITAMIN D 25 HYDROXY (VIT D DEFICIENCY, FRACTURES): Vit D, 25-Hydroxy: 37 ng/mL (ref 30–89)

## 2013-09-05 LAB — HEMOGLOBIN A1C
Hgb A1c MFr Bld: 6.4 % — ABNORMAL HIGH (ref <5.7)
Mean Plasma Glucose: 137 mg/dL — ABNORMAL HIGH (ref <117)

## 2013-09-06 NOTE — Progress Notes (Signed)
Reviewed personally.  M. Suzanne Allyn Bertoni, MD.  

## 2013-09-08 ENCOUNTER — Ambulatory Visit (INDEPENDENT_AMBULATORY_CARE_PROVIDER_SITE_OTHER): Payer: BC Managed Care – PPO | Admitting: Family Medicine

## 2013-09-08 ENCOUNTER — Encounter: Payer: Self-pay | Admitting: Family Medicine

## 2013-09-08 VITALS — BP 132/76 | HR 62 | Ht 64.0 in | Wt 212.0 lb

## 2013-09-08 DIAGNOSIS — R7309 Other abnormal glucose: Secondary | ICD-10-CM

## 2013-09-08 DIAGNOSIS — I4949 Other premature depolarization: Secondary | ICD-10-CM

## 2013-09-08 DIAGNOSIS — E669 Obesity, unspecified: Secondary | ICD-10-CM

## 2013-09-08 DIAGNOSIS — J45909 Unspecified asthma, uncomplicated: Secondary | ICD-10-CM

## 2013-09-08 DIAGNOSIS — M719 Bursopathy, unspecified: Secondary | ICD-10-CM

## 2013-09-08 DIAGNOSIS — M7150 Other bursitis, not elsewhere classified, unspecified site: Secondary | ICD-10-CM

## 2013-09-08 DIAGNOSIS — J309 Allergic rhinitis, unspecified: Secondary | ICD-10-CM

## 2013-09-08 DIAGNOSIS — I493 Ventricular premature depolarization: Secondary | ICD-10-CM

## 2013-09-08 DIAGNOSIS — R7302 Impaired glucose tolerance (oral): Secondary | ICD-10-CM

## 2013-09-08 DIAGNOSIS — G4730 Sleep apnea, unspecified: Secondary | ICD-10-CM

## 2013-09-08 DIAGNOSIS — K219 Gastro-esophageal reflux disease without esophagitis: Secondary | ICD-10-CM

## 2013-09-08 MED ORDER — MELOXICAM 15 MG PO TABS: 15.0000 mg | ORAL_TABLET | Freq: Every day | ORAL | Status: DC

## 2013-09-08 MED ORDER — HYDROCHLOROTHIAZIDE 12.5 MG PO CAPS: ORAL_CAPSULE | ORAL | Status: DC

## 2013-09-08 MED ORDER — ESOMEPRAZOLE MAGNESIUM 40 MG PO CPDR: 40.0000 mg | DELAYED_RELEASE_CAPSULE | Freq: Every day | ORAL | Status: DC

## 2013-09-08 MED ORDER — FLUTICASONE PROPIONATE 50 MCG/ACT NA SUSP: 2.0000 | Freq: Every day | NASAL | Status: DC

## 2013-09-08 MED ORDER — ATENOLOL 50 MG PO TABS: ORAL_TABLET | ORAL | Status: DC

## 2013-09-08 MED ORDER — ALBUTEROL SULFATE (2.5 MG/3ML) 0.083% IN NEBU: 2.5000 mg | INHALATION_SOLUTION | Freq: Four times a day (QID) | RESPIRATORY_TRACT | Status: DC | PRN

## 2013-09-08 MED ORDER — MOMETASONE FUROATE 110 MCG/INH IN AEPB: 1.0000 "application " | INHALATION_SPRAY | RESPIRATORY_TRACT | Status: DC

## 2013-09-08 NOTE — Patient Instructions (Signed)
American diabetes association website or  Familydoctor.org

## 2013-09-08 NOTE — Progress Notes (Signed)
   Subjective:    Patient ID: Denise [name], female    DOB: 05-Feb-1956, 58 y.o.   MRN: [id number]  HPI She is here for a medication check. She was recently seen by her gynecologist and examined. Blood work was reviewed and did show an elevated blood sugar with a hemoglobin A1c of 6.4. She also has underlying PVCs and is doing quite nicely on Tenormin. Her reflux is under good control with Nexium. She sends to [place] for the supplies of this medication. She continues on her CPAP. Her allergies are under good control on Flonase. She uses Asmanex and Proventil. She does use a fluid pill for occasional fluid retention. She continues to need Eloxatin for her bilateral hip pain that she describes as a bursitis. She has had previous x-rays on her hip which show a normal changes.  Review of Systems Negative except as above    Objective:   Physical Exam Alert and in no distress otherwise not examined       Assessment & Plan:  Glucose intolerance (impaired glucose tolerance) - Plan: Amb Referral to Nutrition and Diabetic E  PVC's (premature ventricular contractions) - Plan: atenolol (TENORMIN) 50 MG tablet  GERD (gastroesophageal reflux disease) - Plan: esomeprazole (NEXIUM) 40 MG capsule  Sleep apnea  Obesity (BMI 30-39.9)  Allergic rhinitis - Plan: fluticasone (FLONASE) 50 MCG/ACT nasal spray  Asthma - Plan: albuterol (PROVENTIL) (2.5 MG/3ML) 0.083% nebulizer solution, Mometasone Furoate (ASMANEX 30 METERED DOSES) 110 MCG/INH AEPB  Bursitis - Plan: hydrochlorothiazide (MICROZIDE) 12.5 MG capsule, meloxicam (MOBIC) 15 MG tablet  her medications were renewed. The majority of the time was spent discussing diabetes and the spectrum of disease. She realizes that she is technically one step away from this. I will treat her as if she does indeed have diabetes. Discussed major lifestyle changes in regard to diet and exercise. Discussed need for her to do incremental exercises due to her pain and  weight. Will refer to nutrition. She is to return here in 3 months for a followup visit.

## 2013-09-12 ENCOUNTER — Telehealth: Payer: Self-pay | Admitting: Family Medicine

## 2013-09-14 NOTE — Telephone Encounter (Signed)
P.A. Approved Asmanex til 09/13/14, faxed pharmacy.  Pt advised

## 2013-10-16 ENCOUNTER — Telehealth: Payer: Self-pay | Admitting: Nurse Practitioner

## 2013-10-16 NOTE — Telephone Encounter (Signed)
Per fax reports - Patient had colonoscopy done 03/03/2013 by Dr. Ferdinand Lango in [facility].  The procedure was carried out up to 25 cm, then tortuous sigmoid colon with possible adhesions or compression for pelvic tumor and scope was not advanced further.  She was advised to have abarium enema which she further declines.  A CT of abdomen was done on the same day.

## 2013-10-18 ENCOUNTER — Other Ambulatory Visit: Payer: Self-pay | Admitting: Nurse Practitioner

## 2013-10-18 ENCOUNTER — Telehealth: Payer: Self-pay | Admitting: Nurse Practitioner

## 2013-10-18 DIAGNOSIS — R1900 Intra-abdominal and pelvic swelling, mass and lump, unspecified site: Secondary | ICD-10-CM

## 2013-10-19 ENCOUNTER — Telehealth: Payer: Self-pay | Admitting: *Deleted

## 2013-10-19 NOTE — Telephone Encounter (Signed)
Patient called back and spoke with Colletta Maryland; she ws OK with PUS being ordered per Dr. Sabra Heck. Order was placed in [place].

## 2013-10-20 ENCOUNTER — Telehealth: Payer: Self-pay | Admitting: Obstetrics and Gynecology

## 2013-10-20 DIAGNOSIS — R1900 Intra-abdominal and pelvic swelling, mass and lump, unspecified site: Secondary | ICD-10-CM

## 2013-10-20 NOTE — Telephone Encounter (Signed)
Left message for patient to call back to schedule PUS. Voicemail confirmed patient name

## 2013-10-26 ENCOUNTER — Other Ambulatory Visit: Payer: Self-pay | Admitting: Family Medicine

## 2013-10-27 NOTE — Telephone Encounter (Signed)
Left message for patient to call back  

## 2013-10-31 NOTE — Telephone Encounter (Signed)
Patient returning Sabrina's call. °

## 2013-10-31 NOTE — Telephone Encounter (Signed)
Erroneous encounter

## 2013-11-23 ENCOUNTER — Ambulatory Visit: Payer: BC Managed Care – PPO | Admitting: Podiatry

## 2013-11-27 ENCOUNTER — Ambulatory Visit (INDEPENDENT_AMBULATORY_CARE_PROVIDER_SITE_OTHER): Payer: BC Managed Care – PPO

## 2013-11-27 ENCOUNTER — Encounter: Payer: Self-pay | Admitting: Podiatry

## 2013-11-27 ENCOUNTER — Ambulatory Visit (INDEPENDENT_AMBULATORY_CARE_PROVIDER_SITE_OTHER): Payer: BC Managed Care – PPO | Admitting: Podiatry

## 2013-11-27 VITALS — BP 135/69 | HR 51 | Resp 16

## 2013-11-27 DIAGNOSIS — M779 Enthesopathy, unspecified: Secondary | ICD-10-CM

## 2013-11-27 DIAGNOSIS — M2040 Other hammer toe(s) (acquired), unspecified foot: Secondary | ICD-10-CM

## 2013-11-27 MED ORDER — TRIAMCINOLONE ACETONIDE 10 MG/ML IJ SUSP
10.0000 mg | Freq: Once | INTRAMUSCULAR | Status: AC
  Administered 2013-11-27: 10 mg

## 2013-11-27 NOTE — Progress Notes (Signed)
   Subjective:    Patient ID: Denise [name], female    DOB: 03-09-1956, 58 y.o.   MRN: [id number]  HPI Comments: "I have pain in the ball of my foot"  Patient c/o aching plantar forefoot bilateral, left over right, for 4 years (off and on). The area doesn't feel swollen. Hurts a lot when walking. She does take meloxicam, has orthotics (7 pairs), and buys new shoes about every 4 months. She saw Dr. Lynann Bologna - no relief  Foot Pain      Review of Systems  Cardiovascular: Positive for palpitations.  Musculoskeletal: Positive for gait problem.  All other systems reviewed and are negative.      Objective:   Physical Exam        Assessment & Plan:

## 2013-11-27 NOTE — Progress Notes (Signed)
Subjective:     Patient ID: Denise [name], female   DOB: 27-Dec-1955, 58 y.o.   MRN: [id number]  Foot Pain   patient states I'm having a lot of pain on the bottom of my foot left over right for the last 4 years with numerous orthotics which have given me some benefit but pain that continues to make it difficult for me to ambulate   Review of Systems  All other systems reviewed and are negative.      Objective:   Physical Exam  Nursing note and vitals reviewed. Constitutional: She is oriented to person, place, and time.  Cardiovascular: Intact distal pulses.   Musculoskeletal: Normal range of motion.  Neurological: She is oriented to person, place, and time.  Skin: Skin is warm.   neurovascular status intact with deformity of the second toe noted with lifting of the toe and inflammation and pain around the second MPJ left over right. Muscle strength was adequate and range of motion subtalar and midtarsal joint within normal limits. Digits well perfused and adequate arch height noted upon orthopedic exam     Assessment:     Capsulitis of the second MPJ with deformity of the second toe creating chronic inflammation around the joint surface    Plan:     H&P and x-rays reviewed. Today proximal nerve block and aspirated the joint and was able to get out a small amount of serosanguineous fluid and injected with a half cc of dexamethasone Kenalog and applied thick plantar pad. Discussed long-term orthotics which we may change for her and possibilities of digital stabilization depending on response to medication. Reappoint in 2 weeks

## 2013-12-14 ENCOUNTER — Ambulatory Visit (INDEPENDENT_AMBULATORY_CARE_PROVIDER_SITE_OTHER): Payer: BC Managed Care – PPO | Admitting: Podiatry

## 2013-12-14 ENCOUNTER — Encounter: Payer: Self-pay | Admitting: Podiatry

## 2013-12-14 VITALS — BP 155/89 | HR 50 | Resp 16

## 2013-12-14 DIAGNOSIS — M779 Enthesopathy, unspecified: Secondary | ICD-10-CM

## 2013-12-14 NOTE — Progress Notes (Signed)
Subjective:     Patient ID: Denise [name], female   DOB: 22-Feb-1956, 58 y.o.   MRN: [id number]  HPI patient states my feet are feeling quite a bit better with infl states this is the best it's been for the last 4 yearammation that has reduced and I'm able to actually walk.   Review of Systems     Objective:   Physical ExamNeurovascular status intact patient well oriented x3 with significant diminishment of range of motion second MPJ left and right foot with digital deformity still noted Improve capsulitis was structural malalignment of feet still noted with fluid buildup of the joint surface     Assessment:         Plan:     Reviewed condition and scanned for custom orthotics to reduce stress against the joint. Patient will be seen back to recheck

## 2014-01-05 ENCOUNTER — Ambulatory Visit (INDEPENDENT_AMBULATORY_CARE_PROVIDER_SITE_OTHER): Payer: BC Managed Care – PPO | Admitting: *Deleted

## 2014-01-05 DIAGNOSIS — M779 Enthesopathy, unspecified: Secondary | ICD-10-CM

## 2014-01-05 NOTE — Progress Notes (Signed)
   Subjective:    Patient ID: Denise [name], female    DOB: [birth year], 58 y.o.   MRN: [id number]  HPI PICK UP ORTHOTICS AND GIVEN INSTRUCTION.    Review of Systems     Objective:   Physical Exam        Assessment & Plan:

## 2014-01-05 NOTE — Patient Instructions (Signed)

## 2014-01-18 ENCOUNTER — Telehealth: Payer: Self-pay | Admitting: Emergency Medicine

## 2014-01-18 NOTE — Telephone Encounter (Signed)
Patient is calling with questions regarding pelvic ultrasound that she has scheduled for next week 01/25/14 with Dr. Quincy Simmonds. She has questions about how procedure is done. Advised patient that transvaginal ultrasound will be performed and it will be to visualize uterus and ovaries. Advised patient the ultrasound probe is similar to size and shape of pelvic speculum and should not cause pain. Patient states she is uncomfortable with pelvic exams and is wondering if she needs to be medicated prior. I advised that this could be discussed with Dr. Quincy Simmonds, she would need a driver.Patient declines to go forward with planning for premedication at this time. Advised there should be very limited pain, if any, and that our ultrasonographer, would not continue exam if she is in pain and that Dr. Quincy Simmonds could evaluate as necessary.  Empathized with patient and ensured that we would do our best to keep patient comfortable during procedure. Patient is very thankful for this information and will keep appointment as scheduled.   Routing to provider for final review. Patient agreeable to disposition. Will close encounter

## 2014-01-25 ENCOUNTER — Ambulatory Visit (INDEPENDENT_AMBULATORY_CARE_PROVIDER_SITE_OTHER): Payer: BC Managed Care – PPO | Admitting: Obstetrics and Gynecology

## 2014-01-25 ENCOUNTER — Encounter: Payer: Self-pay | Admitting: Obstetrics and Gynecology

## 2014-01-25 ENCOUNTER — Other Ambulatory Visit: Payer: Self-pay | Admitting: Obstetrics and Gynecology

## 2014-01-25 ENCOUNTER — Ambulatory Visit (INDEPENDENT_AMBULATORY_CARE_PROVIDER_SITE_OTHER): Payer: BC Managed Care – PPO

## 2014-01-25 VITALS — BP 120/70 | Resp 14 | Ht 64.0 in | Wt 202.0 lb

## 2014-01-25 DIAGNOSIS — Z800 Family history of malignant neoplasm of digestive organs: Secondary | ICD-10-CM

## 2014-01-25 DIAGNOSIS — R1900 Intra-abdominal and pelvic swelling, mass and lump, unspecified site: Secondary | ICD-10-CM

## 2014-01-25 DIAGNOSIS — E663 Overweight: Secondary | ICD-10-CM

## 2014-01-25 NOTE — Progress Notes (Signed)
Subjective  58 year old G66P0 caucasian female  Status post [place] for fibroids Patient is here for pelvic ultrasound today to check for a pelvic mass. Had colonoscopy with her GI provider, and study was incomplete past the sigmoid colon.   Unable to pass colonoscope beyond this point.  Recommendation made for CT of abdomen and pelvis and barium enema. Here now to rule out a pelvic mass as the etiology of the incomplete study.   Denies abdominal pain, nausea and vomiting, change in the caliber of her stools, or rectal bleeding.  Feels well.   Objective   See ultrasound below - images and report reviewed with patient.  Vaginal cuff not well visualized transabdominally.  Right and left ovaries are normal.  No free fluid in the cul de sac.     Assessment  No evidence of pelvic mass.  Incomplete colonoscopy.  Family history of colon cancer in maternal grandmother.  Overweight - limiting pelvic ultrasound ability to pick up a mass.  Plan  I offered for the patient to have a second opinion by GI specialist regarding her incomplete colonoscopy.  She will consider but does not want to schedule at this time.  We discussed brieflyt DNA testing and virtual colonoscopy. I also directed her to the web site [email or web address] Return prn.

## 2014-01-25 NOTE — Patient Instructions (Signed)
Colorectal Cancer Screening Colorectal cancer screening is done to detect early disease. Colorectal refers to the colon and rectum. The colon and rectum are located at the end of the large intestine (digestive system), and carry your bowel movements out of the body. Screening may be done even if you are not experiencing symptoms.  Colorectal cancer screening checks for:  Polyps. These are small growths in the lining of the colon that can turn cancerous.  Cancer that is already growing. Cancer is a cluster of abnormal cells that can cause problems in the body. REASONS FOR COLORECTAL CANCER SCREENING  It is common for polyps to form in the lining of the colon, especially in older people. These polyps can be cancerous or become cancerous.  Caught early, colorectal cancer is treatable.  Cancer can be life threatening. Detecting or preventing cancer early can save your life and allow you to enjoy life longer. TYPES OF SCREENING  Fecal occult blood testing. A stool sample is examined for blood in the laboratory.  Sigmoidoscopy. A sigmoidoscope is used to examine the rectum and lower colon. A sigmoidoscope is a flexible tube with a camera that is inserted through your anus to examine your lower rectum.  Colonoscopy. The longer colonoscope is used to examine the entire colon. A colonoscope is also a thin, flexible tube with a camera. This test examines the colon and rectum. Other tests include:  Digital rectal exam.  Barium enema.  Stool DNA test.  Virtual colonoscopy is the use of computerized X-ray scan (computed tomography, CT) to take X-ray images of your colon. WHO SHOULD HAVE COLORECTAL CANCER SCREENING?  Screening is recommended for all adults aged 41 to 78 years.  Screening is generally done every 5 to 10 years or more frequently if you have a family history or symptoms.  Screening is rarely recommended in adults aged 48 to 7 years. Screening is not recommended in adults aged 71  years and older. Your caregiver may recommend screening at a younger age and more frequent screening if you have:  A history of colorectal cancer or polyps.  Family members with histories of colorectal cancer or polyps.  Inflammatory bowel disease, such as ulcerative colitis or Crohn's disease.  A type of hereditary colon cancer syndrome. Talk with your caregiver about any symptoms, personal and family history. SYMPTOMS OF COLORECTAL CANCER It is important to discuss the following symptoms with your caregiver. These symptoms may be the result of other conditions and may be easily treated:  Rectal bleeding.  Blood in your stool.  Changes in bowel movements (hard or loose stools). These changes may last several weeks.  Abdominal cramping.  Feeling the pressure to have a bowel movement when there is no bowel movement.  Feeling tired or weak.  Unexplained weight loss.  Unexplained low red blood cell count. This may also be called iron deficiency anemia. HOME CARE INSTRUCTIONS   Follow up with your caregiver as directed.  Follow all instructions for preparation before your test as well as after. PREVENTION  Following healthy lifestyle habits each day can reduce your chance of getting colorectal cancer and many other types of cancer:  Eat a healthy, well-balanced diet rich in fruits and vegetables and low in fats, sugars and cholesterol.  Stay active. Try to exercise at least 4 to 6 times per week for 30 minutes.  Maintain a healthy weight. Ask your caregiver what a healthy weight range is for you.  Women should only drink 1 alcoholic drink per day.  Men should only drink 2 alcoholic drinks per day.  Quit smoking. SEEK MEDICAL CARE IF:   You experience abdominal or rectal symptoms (see Symptoms of Colorectal Cancer).  Your gastrointestinal issues (constipation, diarrhea) do not go away as expected.  You have questions or concerns. FOR MORE INFORMATION  American Academy  of Family Physicians www.familydoctor.org  Centers for Disease Control and Prevention http://www.wolf.info/  [name] Preventive Services Task Force www.uspreventiveservicestaskforce.[place] www.cancer.org MAKE SURE YOU:   Understand these instructions.  Will watch your condition.  Will get help right away if you are not doing well or get worse. Always follow up with your caregiver to find out the results of your tests. Not all test results may be available during your visit. If your test results are not back during the visit, make an appointment with your caregiver to find out the results. Do not assume everything is normal if you have not heard from your caregiver or the medical facility. It is important for you to follow up on all of your test results.  Document Released: 01/14/2010 Document Revised: 10/19/2011 Document Reviewed: 01/14/2010 [facility] Patient Information 2015 [place], [place]. This information is not intended to replace advice given to you by your health care Zykia Walla. Make sure you discuss any questions you have with your health care Destony Prevost.  [email or web address]

## 2014-02-13 ENCOUNTER — Other Ambulatory Visit: Payer: Self-pay | Admitting: Family Medicine

## 2014-02-21 LAB — HM MAMMOGRAPHY

## 2014-02-22 ENCOUNTER — Telehealth: Payer: Self-pay

## 2014-02-22 ENCOUNTER — Other Ambulatory Visit: Payer: Self-pay

## 2014-02-22 MED ORDER — ESOMEPRAZOLE MAGNESIUM 40 MG PO CPDR: 40.0000 mg | DELAYED_RELEASE_CAPSULE | Freq: Every day | ORAL | Status: DC

## 2014-02-22 NOTE — Telephone Encounter (Signed)
PT WANTED [name] TO SEND A RX TO [facility]. SO I HAVE PRINTED RX AND MAILED TO HER

## 2014-02-22 NOTE — Telephone Encounter (Signed)
Refill request Esomeprazole 40mg  #100 from [facility]

## 2014-02-22 NOTE — Telephone Encounter (Signed)
I HAVE PUT THE FAX ON DR.LALONDES DESK

## 2014-02-23 ENCOUNTER — Encounter: Payer: Self-pay | Admitting: Family Medicine

## 2014-02-23 ENCOUNTER — Encounter: Payer: Self-pay | Admitting: Internal Medicine

## 2014-02-23 LAB — HM MAMMOGRAPHY

## 2014-02-26 ENCOUNTER — Other Ambulatory Visit: Payer: Self-pay | Admitting: Family Medicine

## 2014-02-26 NOTE — Telephone Encounter (Signed)
Gave pt # 30 no refills

## 2014-03-01 ENCOUNTER — Encounter: Payer: Self-pay | Admitting: Family Medicine

## 2014-03-01 ENCOUNTER — Ambulatory Visit (INDEPENDENT_AMBULATORY_CARE_PROVIDER_SITE_OTHER): Payer: BC Managed Care – PPO | Admitting: Family Medicine

## 2014-03-01 VITALS — BP 122/68 | HR 68 | Ht 64.5 in | Wt 199.0 lb

## 2014-03-01 DIAGNOSIS — IMO0002 Reserved for concepts with insufficient information to code with codable children: Secondary | ICD-10-CM

## 2014-03-01 DIAGNOSIS — M25562 Pain in left knee: Secondary | ICD-10-CM

## 2014-03-01 DIAGNOSIS — M25569 Pain in unspecified knee: Secondary | ICD-10-CM

## 2014-03-01 DIAGNOSIS — M13162 Monoarthritis, not elsewhere classified, left knee: Secondary | ICD-10-CM

## 2014-03-01 DIAGNOSIS — M1710 Unilateral primary osteoarthritis, unspecified knee: Secondary | ICD-10-CM

## 2014-03-01 MED ORDER — BUPIVACAINE HCL (PF) 0.5 % IJ SOLN
1.0000 mL | Freq: Once | INTRAMUSCULAR | Status: AC
  Administered 2014-03-01: 1 mL

## 2014-03-01 MED ORDER — HYDROCODONE-ACETAMINOPHEN 5-325 MG PO TABS: 1.0000 | ORAL_TABLET | Freq: Four times a day (QID) | ORAL | Status: DC | PRN

## 2014-03-01 MED ORDER — LIDOCAINE HCL 2 % IJ SOLN: 1.0000 mL | Freq: Once | INTRAMUSCULAR | Status: DC

## 2014-03-01 MED ORDER — DICLOFENAC SODIUM 75 MG PO TBEC: 75.0000 mg | DELAYED_RELEASE_TABLET | Freq: Two times a day (BID) | ORAL | Status: DC

## 2014-03-01 MED ORDER — TRIAMCINOLONE ACETONIDE 40 MG/ML IJ SUSP: 40.0000 mg | Freq: Once | INTRAMUSCULAR | Status: DC

## 2014-03-01 NOTE — Patient Instructions (Signed)
Go to [facility] tomorrow to get x-ray of the knee. Stop taking the meloxicam. Instead, start taking voltaren twice daily with food. Use the hydrocodone in the evening, if needed for severe pain--this can make you sleepy.  Don't drive when taking it.  Use a knee brace to help with compression and swelling.  Continue to ice the knee as needed.  Avoid twisting, deep bending, heavy lifting.  Return next week for re-evaluation if not improving at all. Return immediately if redness, fever, significantly worsening pain.

## 2014-03-01 NOTE — Progress Notes (Signed)
Chief Complaint  Patient presents with  . Knee Pain    swollen and painful x 1 month. Also discolored. Worsening.    A month ago she started having pain in her left knee.  She had been painting a door, and then afterwards realized she was limping.  Denies any deep squatting, twisting, fall or known injury. She had just been sitting and standing while painting.  She had been taking mobic for hip bursitis and other pain, and continued to take it.  It hurt for about 4 days, then got better for a few days, then it came back again.  The pain is now keeping her awake at night.  +pain with walking/standing. She has continued to take the mobic daily.  She also noticed discoloration/hyperpigmentation on both sides of the knee. This was first noticed the same time the pain started. Denies that it could be related to ice, or anything topical she has put on it.  She thought it was a bruise, but realizes that the color has stayed the same (was never darker, faded).  She has been icing the knee twice a day--might help some with pain, but not decreasing swelling or fix it long-term.  Denies any instability or giving way.  Past Medical History  Diagnosis Date  . Asthma   . Allergy   . GERD (gastroesophageal reflux disease)   . Sleep apnea   . PVC's (premature ventricular contractions)   . Fibroids     TAH in 1999  . Bursitis of left hip    Past Surgical History  Procedure Laterality Date  . Cardiac cath  05/2007    J. [facility]Y  . Total abdominal hysterectomy  05/1998    secondary to fibroids  . Wisdom tooth extraction  mid teens   History   Social History  . Marital Status: Single    Spouse Name: N/A    Number of Children: 0  . Years of Education: N/A   Occupational History  .     Social History Main Topics  . Smoking status: Never Smoker   . Smokeless tobacco: Never Used  . Alcohol Use: Yes     Comment: only if out to dinner, maybe once a month  . Drug Use: No  . Sexual Activity: Not  on file   Other Topics Concern  . Not on file   Social History Narrative  . No narrative on file   Current Outpatient Prescriptions on File Prior to Visit  Medication Sig Dispense Refill  . atenolol (TENORMIN) 50 MG tablet TAKE 1 TABLET BY MOUTH TWICE A DAY  60 tablet  11  . esomeprazole (NEXIUM) 40 MG capsule Take 1 capsule (40 mg total) by mouth daily before breakfast.  100 capsule  0  . hydrochlorothiazide (MICROZIDE) 12.5 MG capsule TAKE ONE CAPSULE BY MOUTH EVERY DAY  30 capsule  2  . meloxicam (MOBIC) 15 MG tablet Take 1 tablet (15 mg total) by mouth daily.  30 tablet  5  . Mometasone Furoate (ASMANEX 30 METERED DOSES) 110 MCG/INH AEPB Inhale 1 application into the lungs 1 day or 1 dose.  1 Inhaler  11  . triazolam (HALCION) 0.25 MG tablet TAKE 1 TABLET BY MOUTH AT BEDTIME AS NEEDED  30 tablet  0  . albuterol (PROVENTIL) (2.5 MG/3ML) 0.083% nebulizer solution Take 3 mLs (2.5 mg total) by nebulization every 6 (six) hours as needed.  75 mL  1  . fluticasone (FLONASE) 50 MCG/ACT nasal spray Place 2  sprays into both nostrils daily.  16 g  11   No current facility-administered medications on file prior to visit.   Allergies  Allergen Reactions  . Morphine And Related     ROS:  No fevers, chills. No known arthritis elsewhere, just bursitis in the hip. No abdominal pain, chest pain.  Breathing at baseline, not needing albuterol.  Denies other joint pains, myalgias.  PHYSICAL EXAM: BP 122/68  Pulse 68  Ht 5' 4.5" (1.638 m)  Wt 199 lb (90.266 kg)  BMI 33.64 kg/m2 Pleasant female, in no acute distress Examination of left knee: Some hyperpigmentation noted medially and laterally at knee, over joint line area.  There is mild warmth overlying the knee. Tender at L joint line, with effusion.  Swelling noted at joint lines, and medially.  nontender medially and laterally at knees.  Endpoint of collateral ligaments intact. Pain laterally with valgus stress.  Negative  [place]  ASSESSMENT/PLAN:  Inflammation of joint of knee, left - Plan: bupivacaine (MARCAINE) 0.5 % injection 1 mL, lidocaine (XYLOCAINE) 2 % (with pres) injection 20 mg, triamcinolone acetonide (KENALOG-40) injection 40 mg, DG Knee Complete 4 Views Left  Left knee pain - Plan: DG Knee Complete 4 Views Left, HYDROcodone-acetaminophen (NORCO) 5-325 MG per tablet, diclofenac (VOLTAREN) 75 MG EC tablet  Suspect meniscal injury based on exam.  Unsure why anti-inflammatory isn't helping more.  Clearly needs better pain control in the evening, as pain is interfering with sleep.  Attempted aspiration, using medial approach, after cleansing area with betadine.  No fluid was obtained; only marginally tolerated the attempt, and declined additional attempt.  Injection was NOT performed (since didn't get into joint space).  Patient is to wear knee brace (compression sleeve); change from mobic to BID voltaren.  She will get x-ray at [facility] tomorrow.  Will use norco as needed for severe pain in the evenings.  Risks and side effects were reviewed of all meds.  F/u with Dr. Redmond School next week if not improving. F/u immediately if fevers, increasing swelling/warmth/pain or new symptoms develop  Pt understands plan.

## 2014-03-02 ENCOUNTER — Ambulatory Visit
Admission: RE | Admit: 2014-03-02 | Discharge: 2014-03-02 | Disposition: A | Discharge status: Home / Self Care | Payer: BC Managed Care – PPO | Source: Ambulatory Visit | Attending: Family Medicine | Admitting: Family Medicine

## 2014-03-02 DIAGNOSIS — M25562 Pain in left knee: Secondary | ICD-10-CM

## 2014-03-02 DIAGNOSIS — M13162 Monoarthritis, not elsewhere classified, left knee: Secondary | ICD-10-CM

## 2014-03-05 ENCOUNTER — Ambulatory Visit: Payer: BC Managed Care – PPO | Admitting: Family Medicine

## 2014-03-05 ENCOUNTER — Telehealth: Payer: Self-pay | Admitting: Internal Medicine

## 2014-03-05 NOTE — Telephone Encounter (Signed)
Pt wants to know what else she can do for bone spurs. Pt seen Dr. Tomi Bamberger last week and had an xray done

## 2014-03-05 NOTE — Telephone Encounter (Signed)
Have her schedule an appointment so we can go over this in more detail

## 2014-03-05 NOTE — Telephone Encounter (Signed)
I HAVE CALLED PT AT [phone] PT IS AWARE AND HAS MADE APPOINTMENT

## 2014-03-08 ENCOUNTER — Encounter: Payer: Self-pay | Admitting: Internal Medicine

## 2014-03-13 ENCOUNTER — Ambulatory Visit (INDEPENDENT_AMBULATORY_CARE_PROVIDER_SITE_OTHER): Payer: BC Managed Care – PPO | Admitting: Family Medicine

## 2014-03-13 ENCOUNTER — Other Ambulatory Visit: Payer: Self-pay | Admitting: Family Medicine

## 2014-03-13 DIAGNOSIS — M79609 Pain in unspecified limb: Secondary | ICD-10-CM

## 2014-03-13 DIAGNOSIS — M1712 Unilateral primary osteoarthritis, left knee: Secondary | ICD-10-CM

## 2014-03-13 DIAGNOSIS — M79672 Pain in left foot: Secondary | ICD-10-CM

## 2014-03-13 DIAGNOSIS — M1710 Unilateral primary osteoarthritis, unspecified knee: Secondary | ICD-10-CM

## 2014-03-13 DIAGNOSIS — IMO0002 Reserved for concepts with insufficient information to code with codable children: Secondary | ICD-10-CM

## 2014-03-13 NOTE — Patient Instructions (Signed)
Cut back on white food. More frequent but smaller meals. 20 minutes of exercise daily but it doesn't have to be all at one time

## 2014-03-13 NOTE — Telephone Encounter (Signed)
Is this okay to refill? 

## 2014-03-13 NOTE — Progress Notes (Signed)
   Subjective:    Patient ID: Denise [name], female    DOB: 1955/10/04, 58 y.o.   MRN: [id number]  HPI She is here for consult concerning continued difficulty with left knee pain. Review of the x-ray does show some degenerative changes especially laterally. I did review the x-ray with her. She is concerned over treatment and followup although she does say that now she is having less pain. She also is had some difficulty with the second toe of her left foot and has seen podiatry for this. She does have a slight boutonniere type deformity.   Review of Systems     Objective:   Physical Exam Alert and in no distress. Exam of her left knee does show slight effusion. Jointline was not well palpated.       Assessment & Plan:  Arthritis of left knee  Left foot pain  discussed care for her knee in regard to further injections versus possible surgical intervention. We will continue to treat her conservatively. Also discussed the foot in regard to using orthotics as opposed to proceeding further with surgical intervention. Discussed the benefits of weight loss in regard to helping both her knee and her foot. Discussed cutting back on carbohydrates.

## 2014-03-18 ENCOUNTER — Other Ambulatory Visit: Payer: Self-pay | Admitting: Family Medicine

## 2014-03-19 ENCOUNTER — Ambulatory Visit (INDEPENDENT_AMBULATORY_CARE_PROVIDER_SITE_OTHER): Payer: BC Managed Care – PPO | Admitting: Podiatry

## 2014-03-19 ENCOUNTER — Telehealth: Payer: Self-pay | Admitting: Family Medicine

## 2014-03-19 ENCOUNTER — Encounter: Payer: Self-pay | Admitting: Podiatry

## 2014-03-19 VITALS — BP 124/65 | HR 50 | Resp 16

## 2014-03-19 DIAGNOSIS — M2040 Other hammer toe(s) (acquired), unspecified foot: Secondary | ICD-10-CM

## 2014-03-19 DIAGNOSIS — M25562 Pain in left knee: Secondary | ICD-10-CM

## 2014-03-19 DIAGNOSIS — M779 Enthesopathy, unspecified: Secondary | ICD-10-CM

## 2014-03-19 MED ORDER — TRIAMCINOLONE ACETONIDE 10 MG/ML IJ SUSP
10.0000 mg | Freq: Once | INTRAMUSCULAR | Status: AC
  Administered 2014-03-19: 10 mg

## 2014-03-19 MED ORDER — DICLOFENAC SODIUM 75 MG PO TBEC: 75.0000 mg | DELAYED_RELEASE_TABLET | Freq: Two times a day (BID) | ORAL | Status: DC

## 2014-03-19 NOTE — Progress Notes (Signed)
Subjective:     Patient ID: Denise [name], female   DOB: September 26, 1955, 58 y.o.   MRN: [id number]  HPI patient is found to have reoccurrence of pain left second metatarsophalangeal joint with inflammation and fluid occurring with pressure. Was doing very well but then did wear in adequate shoes   Review of Systems     Objective:   Physical Exam Neurovascular status intact with muscle strength adequate in range of motion within normal limits. Patient's found to have inflammation and fluid buildup around the second metatarsophalangeal joint left with pain note    Assessment:     Capsulitis of the left second MPJ which has recurred    Plan:     Reviewed the types of shoes I want her to wear and today I did do a proximal nerve block aspirated the joint was able to get out fluid and did infiltrate with dexamethasone Kenalog combination and applied padding. Patient understands at one point surgery may be necessary for this condition

## 2014-03-19 NOTE — Telephone Encounter (Signed)
Pt called needs refill to [facility] for Diclofenac.    Pt waiting.

## 2014-05-16 DIAGNOSIS — M779 Enthesopathy, unspecified: Secondary | ICD-10-CM

## 2014-05-27 ENCOUNTER — Other Ambulatory Visit: Payer: Self-pay | Admitting: Family Medicine

## 2014-05-28 NOTE — Telephone Encounter (Signed)
DR.LALONDE IS THIS OKAY 

## 2014-05-30 ENCOUNTER — Other Ambulatory Visit: Payer: Self-pay | Admitting: Family Medicine

## 2014-05-30 ENCOUNTER — Other Ambulatory Visit: Payer: Self-pay

## 2014-05-30 ENCOUNTER — Telehealth: Payer: Self-pay | Admitting: Family Medicine

## 2014-05-30 NOTE — Telephone Encounter (Signed)
Pt already requested refill from pharmacy ([facility]) for Atenolol 50MG , Hydrochlorothiazide 12.5MG , Diclofenac 75MG  & pt wants to know why refills were not put on meds in Jan 2015 to last until next Med Check is due in Jan 2016.Advised the she should have refills on Atenolol so she wants refills on other two meds to last until Jan 2016 if possible

## 2014-05-30 NOTE — Telephone Encounter (Signed)
Are these okay

## 2014-05-30 NOTE — Telephone Encounter (Signed)
Dr.lalonde has taken care of this

## 2014-05-30 NOTE — Telephone Encounter (Signed)
i have sent this to dr.lalonde for auth.

## 2014-06-21 ENCOUNTER — Ambulatory Visit: Payer: BC Managed Care – PPO | Admitting: Medical

## 2014-07-18 ENCOUNTER — Telehealth: Payer: Self-pay | Admitting: Family Medicine

## 2014-07-18 NOTE — Telephone Encounter (Signed)
lm

## 2014-08-10 HISTORY — PX: HAMMER TOE SURGERY: SHX385

## 2014-09-04 ENCOUNTER — Other Ambulatory Visit: Payer: Self-pay | Admitting: Family Medicine

## 2014-09-04 ENCOUNTER — Other Ambulatory Visit: Payer: Self-pay

## 2014-09-04 NOTE — Telephone Encounter (Signed)
Okay to renew

## 2014-09-04 NOTE — Telephone Encounter (Signed)
Dr.Lalonde is this okay 

## 2014-09-06 ENCOUNTER — Encounter: Payer: Self-pay | Admitting: Family Medicine

## 2014-09-06 ENCOUNTER — Ambulatory Visit (INDEPENDENT_AMBULATORY_CARE_PROVIDER_SITE_OTHER): Payer: BC Managed Care – PPO | Admitting: Nurse Practitioner

## 2014-09-06 ENCOUNTER — Encounter: Payer: Self-pay | Admitting: Nurse Practitioner

## 2014-09-06 ENCOUNTER — Ambulatory Visit (INDEPENDENT_AMBULATORY_CARE_PROVIDER_SITE_OTHER): Payer: BC Managed Care – PPO | Admitting: Family Medicine

## 2014-09-06 VITALS — BP 124/78 | HR 78 | Wt 201.0 lb

## 2014-09-06 VITALS — BP 126/72 | HR 64 | Ht 64.25 in | Wt 202.0 lb

## 2014-09-06 DIAGNOSIS — M2242 Chondromalacia patellae, left knee: Secondary | ICD-10-CM

## 2014-09-06 DIAGNOSIS — Z0000 Encounter for general adult medical examination without abnormal findings: Secondary | ICD-10-CM

## 2014-09-06 DIAGNOSIS — K219 Gastro-esophageal reflux disease without esophagitis: Secondary | ICD-10-CM

## 2014-09-06 DIAGNOSIS — Z01419 Encounter for gynecological examination (general) (routine) without abnormal findings: Secondary | ICD-10-CM

## 2014-09-06 DIAGNOSIS — J309 Allergic rhinitis, unspecified: Secondary | ICD-10-CM

## 2014-09-06 DIAGNOSIS — G4730 Sleep apnea, unspecified: Secondary | ICD-10-CM

## 2014-09-06 DIAGNOSIS — E669 Obesity, unspecified: Secondary | ICD-10-CM

## 2014-09-06 DIAGNOSIS — J4520 Mild intermittent asthma, uncomplicated: Secondary | ICD-10-CM

## 2014-09-06 DIAGNOSIS — M1990 Unspecified osteoarthritis, unspecified site: Secondary | ICD-10-CM

## 2014-09-06 LAB — CBC WITH DIFFERENTIAL/PLATELET
BASOS ABS: 0.1 10*3/uL (ref 0.0–0.1)
BASOS PCT: 1 % (ref 0–1)
EOS ABS: 0.1 10*3/uL (ref 0.0–0.7)
Eosinophils Relative: 2 % (ref 0–5)
HEMATOCRIT: 40.7 % (ref 36.0–46.0)
HEMOGLOBIN: 13.5 g/dL (ref 12.0–15.0)
Lymphocytes Relative: 29 % (ref 12–46)
Lymphs Abs: 2.1 10*3/uL (ref 0.7–4.0)
MCH: 26.1 pg (ref 26.0–34.0)
MCHC: 33.2 g/dL (ref 30.0–36.0)
MCV: 78.7 fL (ref 78.0–100.0)
MPV: 10.6 fL (ref 8.6–12.4)
Monocytes Absolute: 0.5 10*3/uL (ref 0.1–1.0)
Monocytes Relative: 7 % (ref 3–12)
Neutro Abs: 4.5 10*3/uL (ref 1.7–7.7)
Neutrophils Relative %: 61 % (ref 43–77)
Platelets: 224 10*3/uL (ref 150–400)
RBC: 5.17 MIL/uL — ABNORMAL HIGH (ref 3.87–5.11)
RDW: 14 % (ref 11.5–15.5)
WBC: 7.4 10*3/uL (ref 4.0–10.5)

## 2014-09-06 LAB — POCT URINALYSIS DIPSTICK
Bilirubin, UA: NEGATIVE
Blood, UA: NEGATIVE
GLUCOSE UA: NEGATIVE
Ketones, UA: NEGATIVE
NITRITE UA: NEGATIVE
Protein, UA: NEGATIVE
Urobilinogen, UA: NEGATIVE
pH, UA: 6

## 2014-09-06 LAB — LIPID PANEL
Cholesterol: 204 mg/dL — ABNORMAL HIGH (ref 0–200)
HDL: 56 mg/dL (ref >39)
LDL Cholesterol: 117 mg/dL — ABNORMAL HIGH (ref 0–99)
TRIGLYCERIDES: 156 mg/dL — AB (ref <150)
Total CHOL/HDL Ratio: 3.6 Ratio
VLDL: 31 mg/dL (ref 0–40)

## 2014-09-06 LAB — COMPREHENSIVE METABOLIC PANEL
ALT: 25 U/L (ref 0–35)
AST: 21 U/L (ref 0–37)
Albumin: 4.2 g/dL (ref 3.5–5.2)
Alkaline Phosphatase: 98 U/L (ref 39–117)
BUN: 13 mg/dL (ref 6–23)
CALCIUM: 9.1 mg/dL (ref 8.4–10.5)
CO2: 29 mEq/L (ref 19–32)
CREATININE: 0.6 mg/dL (ref 0.50–1.10)
Chloride: 101 mEq/L (ref 96–112)
Glucose, Bld: 99 mg/dL (ref 70–99)
Potassium: 4 mEq/L (ref 3.5–5.3)
Sodium: 139 mEq/L (ref 135–145)
TOTAL PROTEIN: 6.8 g/dL (ref 6.0–8.3)
Total Bilirubin: 0.5 mg/dL (ref 0.2–1.2)

## 2014-09-06 LAB — HEMOGLOBIN, FINGERSTICK: Hemoglobin, fingerstick: 12.9 g/dL (ref 12.0–16.0)

## 2014-09-06 LAB — HEMOGLOBIN A1C
Hgb A1c MFr Bld: 6.5 % — ABNORMAL HIGH (ref <5.7)
Mean Plasma Glucose: 140 mg/dL — ABNORMAL HIGH (ref <117)

## 2014-09-06 LAB — TSH: TSH: 2.007 u[IU]/mL (ref 0.350–4.500)

## 2014-09-06 NOTE — Patient Instructions (Addendum)
Stop the Advair and if you need your rescue inhaler more than twice a week during the day or twice a month at night then let me know  3 sets of 10 3 times a day for the next 3 weeks  "my fitness pal' app

## 2014-09-06 NOTE — Progress Notes (Signed)
   Subjective:    Patient ID: Denise [name], female    DOB: [birth year], 59 y.o.   MRN: [id number]  HPI  she is here for medication check. She recently saw her allergist as well as her gynecologist. She does have a hysterectomy. Apparently the gynecologist did do blood work on her. She mainly has trouble with her allergies in the spring and fall and does use a nasal steroid spray with good results. Presently she is using her Advair several times per week because she forgets to use it. She uses her albuterol rescue inhaler rarely. She does have a history of sleep apnea and recently got a machine but has not been using it due to the aesthetics of it. She does have an underlying history of arthritis but recently has had more difficulty with knee pain especially when she sits for long periods of time or goes up or down steps.  She has not made any changes in her lifestyle in regard to helping with weight reduction. She has tried cutting back on the use of Nexium but was unsuccessful. She does use HCTZ for occasional lower extremity edema.   Review of Systems     Objective:   Physical Exam  alert and in no distress. Tympanic membranes and canals are normal. Throat is clear. Tonsils are normal. Neck is supple without adenopathy or thyromegaly. Cardiac exam shows a regular sinus rhythm without murmurs or gallops. Lungs are clear to auscultation.   left knee exam does show some slight tenderness palpation to the medial and lateral poles of the patella. Compression testing did cause some crepitus. Anterior drawer and ligaments are intact.       Assessment & Plan:  Extrinsic asthma, mild intermittent, uncomplicated  Allergic rhinitis, unspecified allergic rhinitis type  Gastroesophageal reflux disease without esophagitis  Sleep apnea  Chondromalacia, patella, left  Arthritis  Obesity (BMI 30-39.9)  instructed her on terminal extension exercises. She will continue on all of her other  medications. Discussed the need for her to use her CPAP and the benefits of that would help in regard to better sleep, reduced risk of hypertension, heart disease. She will continue on her Nexium. Recommend she stop taking the Advair and if she has to use her rescue inhaler more than twice a week during the day or twice a month at night, she will let us know. Apparently she has most difficulty with her asthma in the spring and fall. Also encouraged her to make further diet and exercise changes.

## 2014-09-06 NOTE — Progress Notes (Signed)
Patient ID: Denise [name], female   DOB: 02/28/1956, 59 y.o.   MRN: [id number] 59 y.o. G0P0 Single  Caucasian Fe here for annual exam.  Increase OA of left knee and bunions and hammer toe will need surgery. Will be seeing Dr. Joni Fears tomorrow.  Patient's last menstrual period was 04/25/1998 (exact date).          Sexually active: Yes.    The current method of family planning is none.  Same sex partner. Exercising: No.  The patient does not participate in regular exercise at present. Smoker:  no  Health Maintenance: Pap:  Hyst 1999 MMG:  02/21/14 with ultrasound 02/23/14, Bi-Rads 2:  Benign findings, screening in one year Colonoscopy:  01/2013, Dr. Ferdinand Lango in [place] repeat in 10 year BMD:   02/02/11, 0.3/0.0/0.7 TDaP:  2003 with PCP Labs:  HB:  12.9  Urine:  Trace leuk's   reports that she has never smoked. She has never used smokeless tobacco. She reports that she drinks alcohol. She reports that she does not use illicit drugs.  Past Medical History  Diagnosis Date  . Asthma   . Allergy   . GERD (gastroesophageal reflux disease)   . Sleep apnea   . PVC's (premature ventricular contractions)   . Fibroids     TAH in 1999  . Bursitis of left hip     Past Surgical History  Procedure Laterality Date  . Cardiac cath  05/2007    J. BERRY  . Wisdom tooth extraction  mid teens  . Total abdominal hysterectomy  05/15/1998    secondary to fibroids    Current Outpatient Prescriptions  Medication Sig Dispense Refill  . ADVAIR DISKUS 100-50 MCG/DOSE AEPB Inhale 1 puff into the lungs daily.     Marland Kitchen albuterol (PROVENTIL) (2.5 MG/3ML) 0.083% nebulizer solution Take 3 mLs (2.5 mg total) by nebulization every 6 (six) hours as needed. 75 mL 1  . atenolol (TENORMIN) 50 MG tablet TAKE 1 TABLET BY MOUTH TWICE A DAY 60 tablet 11  . diclofenac (VOLTAREN) 75 MG EC tablet TAKE 1 TABLET (75 MG TOTAL) BY MOUTH 2 (TWO) TIMES DAILY. TAKE WITH FOOD 60 tablet 1  . esomeprazole (NEXIUM) 40 MG capsule Take  1 capsule (40 mg total) by mouth daily before breakfast. 100 capsule 0  . fluticasone (FLONASE) 50 MCG/ACT nasal spray Place 2 sprays into both nostrils daily. 16 g 11  . hydrochlorothiazide (MICROZIDE) 12.5 MG capsule TAKE ONE CAPSULE BY MOUTH EVERY DAY 30 capsule 5  . Mometasone Furoate (ASMANEX 30 METERED DOSES) 110 MCG/INH AEPB Inhale 1 application into the lungs 1 day or 1 dose. 1 Inhaler 11  . triazolam (HALCION) 0.25 MG tablet TAKE 1 TABLET BY MOUTH AT BEDTIME AS NEEDED FOR SLEEP 30 tablet 0   Current Facility-Administered Medications  Medication Dose Route Frequency Provider Last Rate Last Dose  . lidocaine (XYLOCAINE) 2 % (with pres) injection 20 mg  1 mL Intradermal Once Rita Ohara, MD      . triamcinolone acetonide (KENALOG-40) injection 40 mg  40 mg Intramuscular Once Rita Ohara, MD        Family History  Problem Relation Age of Onset  . Cancer Maternal Grandmother     GI cancer in 59's  . Dementia Mother   . Heart disease Father   . Heart failure Father     ROS:  Pertinent items are noted in HPI.  Otherwise, a comprehensive ROS was negative.  Exam:   BP 126/72 mmHg  Pulse 64  Ht 5' 4.25" (1.632 m)  Wt 202 lb (91.627 kg)  BMI 34.40 kg/m2  LMP 04/25/1998 (Exact Date) Height: 5' 4.25" (163.2 cm) Ht Readings from Last 3 Encounters:  09/06/14 5' 4.25" (1.632 m)  03/01/14 5' 4.5" (1.638 m)  01/25/14 5\' 4"  (1.626 m)    General appearance: alert, cooperative and appears stated age Head: Normocephalic, without obvious abnormality, atraumatic Neck: no adenopathy, supple, symmetrical, trachea midline and thyroid normal to inspection and palpation Lungs: clear to auscultation bilaterally Breasts: normal appearance, no masses or tenderness Heart: regular rate and rhythm Abdomen: soft, non-tender; no masses,  no organomegaly Extremities: extremities normal, atraumatic, no cyanosis or edema Skin: Skin color, texture, turgor normal. No rashes or lesions Lymph nodes: Cervical,  supraclavicular, and axillary nodes normal. No abnormal inguinal nodes palpated Neurologic: Grossly normal   Pelvic: External genitalia:  no lesions              Urethra:  normal appearing urethra with no masses, tenderness or lesions              Bartholin's and Skene's: normal                 Vagina: normal appearing vagina with normal color and discharge, no lesions              Cervix: absent              Pap taken: No. Bimanual Exam:  Uterus:  uterus absent              Adnexa: no mass, fullness, tenderness               Rectovaginal: Confirms               Anus:  normal sphincter tone, no lesions  Chaperone present: No  A:  Well Woman with normal exam  S/P TAH secondary to fibroids 05/1998 Postmenopausal on ERT from 05/2007 - 07/2013 HTN   P:   Reviewed health and wellness pertinent to exam  Pap smear taken today  Mammogram is due 7/16  Will follow with labs  Counseled on breast self exam, mammography screening, adequate intake of calcium and vitamin D, diet and exercise return annually or prn  An After Visit Summary was printed and given to the patient.

## 2014-09-06 NOTE — Progress Notes (Signed)
Reviewed personally.  M. Suzanne Joshual Terrio, MD.  

## 2014-09-06 NOTE — Patient Instructions (Signed)

## 2014-09-07 ENCOUNTER — Telehealth: Payer: Self-pay | Admitting: *Deleted

## 2014-09-07 LAB — VITAMIN D 25 HYDROXY (VIT D DEFICIENCY, FRACTURES): VIT D 25 HYDROXY: 22 ng/mL — AB (ref 30–100)

## 2014-09-07 MED ORDER — VITAMIN D (ERGOCALCIFEROL) 1.25 MG (50000 UNIT) PO CAPS: 50000.0000 [IU] | ORAL_CAPSULE | ORAL | Status: DC

## 2014-09-07 NOTE — Addendum Note (Signed)
Addended by: Graylon Good on: 09/07/2014 03:37 PM   Modules accepted: Orders, SmartSet

## 2014-09-07 NOTE — Telephone Encounter (Signed)
I have attempted to contact this patient by phone with the following results: left message to return call to [place] at [phone] on answering machine (mobile per  [facility]).  Name verified in [place].  Advised call is regarding recent labs.  [phone] (Mobile) *Preferred*

## 2014-09-07 NOTE — Telephone Encounter (Signed)
-----   Message from Milford Cage, [place] sent at 09/07/2014  8:12 AM EST ----- Please let patient know that lipids are elevated with total cholesterol, triglycerides, and LDL.  She should be able to follow a low cholesterol diet and do well.  TSH, CMP, CBC is normal. The Vit D is low at 22 and needs to go back on Vit D RX or OTC at 2000 IU daily.  The HGB AIC is elevated at 6.5 which is diagnostic of diabetes.  She needs to see PCP ASAP and start on treatment.

## 2014-09-11 NOTE — Telephone Encounter (Signed)
Pt notified in result note on 09/07/14.  Closing encounter.

## 2014-10-01 ENCOUNTER — Other Ambulatory Visit: Payer: Self-pay | Admitting: Family Medicine

## 2014-10-01 NOTE — Telephone Encounter (Signed)
Is this okay?

## 2014-11-14 ENCOUNTER — Other Ambulatory Visit: Payer: Self-pay | Admitting: Family Medicine

## 2014-11-14 ENCOUNTER — Other Ambulatory Visit: Payer: Self-pay

## 2014-11-14 NOTE — Telephone Encounter (Signed)
Okay to renew

## 2014-11-14 NOTE — Telephone Encounter (Signed)
Is this okay?

## 2014-12-11 ENCOUNTER — Other Ambulatory Visit: Payer: Self-pay | Admitting: Family Medicine

## 2015-02-18 ENCOUNTER — Other Ambulatory Visit: Payer: Self-pay | Admitting: Family Medicine

## 2015-02-18 NOTE — Telephone Encounter (Signed)
Is this okay?

## 2015-02-21 ENCOUNTER — Encounter: Payer: Self-pay | Admitting: Cardiovascular Disease

## 2015-03-13 ENCOUNTER — Encounter: Payer: Self-pay | Admitting: *Deleted

## 2015-04-04 ENCOUNTER — Encounter: Payer: Self-pay | Admitting: Family Medicine

## 2015-04-04 ENCOUNTER — Ambulatory Visit (INDEPENDENT_AMBULATORY_CARE_PROVIDER_SITE_OTHER): Payer: BC Managed Care – PPO | Admitting: Family Medicine

## 2015-04-04 VITALS — BP 116/70 | HR 70 | Temp 98.5°F | Wt 204.0 lb

## 2015-04-04 DIAGNOSIS — J4530 Mild persistent asthma, uncomplicated: Secondary | ICD-10-CM | POA: Diagnosis not present

## 2015-04-04 DIAGNOSIS — J0110 Acute frontal sinusitis, unspecified: Secondary | ICD-10-CM | POA: Diagnosis not present

## 2015-04-04 DIAGNOSIS — J301 Allergic rhinitis due to pollen: Secondary | ICD-10-CM | POA: Diagnosis not present

## 2015-04-04 MED ORDER — AZITHROMYCIN 500 MG PO TABS: 500.0000 mg | ORAL_TABLET | Freq: Every day | ORAL | Status: DC

## 2015-04-04 NOTE — Patient Instructions (Signed)
If you are not totally back to normal in 7-10 days call me for another refill. Stay on all your other medicines.

## 2015-04-04 NOTE — Progress Notes (Signed)
   Subjective:    Patient ID: Denise [name], female    DOB: 12-26-1955, 59 y.o.   MRN: [id number]  HPI She complains of a three-week history of cold symptoms started with sneezing, sore throat, nasal congestion, rhinorrhea and cough that has become productive. No fever, chills or earache. She still has difficulty with nasal congestion PND and slight headache. She does not smoke. She does have underlying asthma and allergies. She continues on her asthma medications without difficulty. Recently she did have hammertoe surgery by Dr. Durward Fortes.   Review of Systems     Objective:   Physical Exam Alert and in no distress. Tympanic membranes and canals are normal. Pharyngeal area is normal. Neck is supple without adenopathy or thyromegaly. Cardiac exam shows a regular sinus rhythm without murmurs or gallops. Lungs are clear to auscultation.Nasal mucosa slightly red with tenderness over maxillary and frontal sinuses.        Assessment & Plan:  Extrinsic asthma, mild persistent, uncomplicated  Allergic rhinitis due to pollen  Acute frontal sinusitis, recurrence not specified - Plan: azithromycin (ZITHROMAX) 500 MG tablet In tinea on her present medications and I will give azithromycin. She is to call me in 7-10 days if not totally back to normal. She is comfortable with that approach.

## 2015-04-18 ENCOUNTER — Telehealth: Payer: Self-pay | Admitting: Family Medicine

## 2015-04-18 DIAGNOSIS — J0110 Acute frontal sinusitis, unspecified: Secondary | ICD-10-CM

## 2015-04-18 MED ORDER — AZITHROMYCIN 500 MG PO TABS: 500.0000 mg | ORAL_TABLET | Freq: Every day | ORAL | Status: DC

## 2015-04-18 NOTE — Telephone Encounter (Signed)
Pt called and stated that she is some better but symptoms still present. She is requesting another round of medication, Zithromax. [facility] uses [facility] [address] and can be reached at [phone].

## 2015-05-16 ENCOUNTER — Telehealth: Payer: Self-pay | Admitting: Family Medicine

## 2015-05-16 NOTE — Telephone Encounter (Signed)
It would not be my first choice with her but she can take it if she would like. I assume that she is getting this from someone else

## 2015-05-16 NOTE — Telephone Encounter (Signed)
She is wanting to know if you would prescribe it for her if it is safe? Do you want her to come in to discuss options?

## 2015-05-16 NOTE — Telephone Encounter (Signed)
She needs to come in to discuss weight loss and the various regimens

## 2015-05-16 NOTE — Telephone Encounter (Signed)
Pt wants to know if she can take Phentermine for weight loss with her health issues, irregular heart beat & takes beta blockers.  She would like to try this if you think it's safe.  Please advise

## 2015-05-17 ENCOUNTER — Other Ambulatory Visit: Payer: Self-pay

## 2015-05-17 NOTE — Telephone Encounter (Signed)
Left word for word message on machine  

## 2015-05-25 IMAGING — CR DG KNEE COMPLETE 4+V*L*
4 series · 4 of 4 positions shown · non-contrast
Comparison: None.

CLINICAL DATA: Left knee pain.

EXAM:
LEFT KNEE - COMPLETE 4+ VIEW

[t knee ap left]
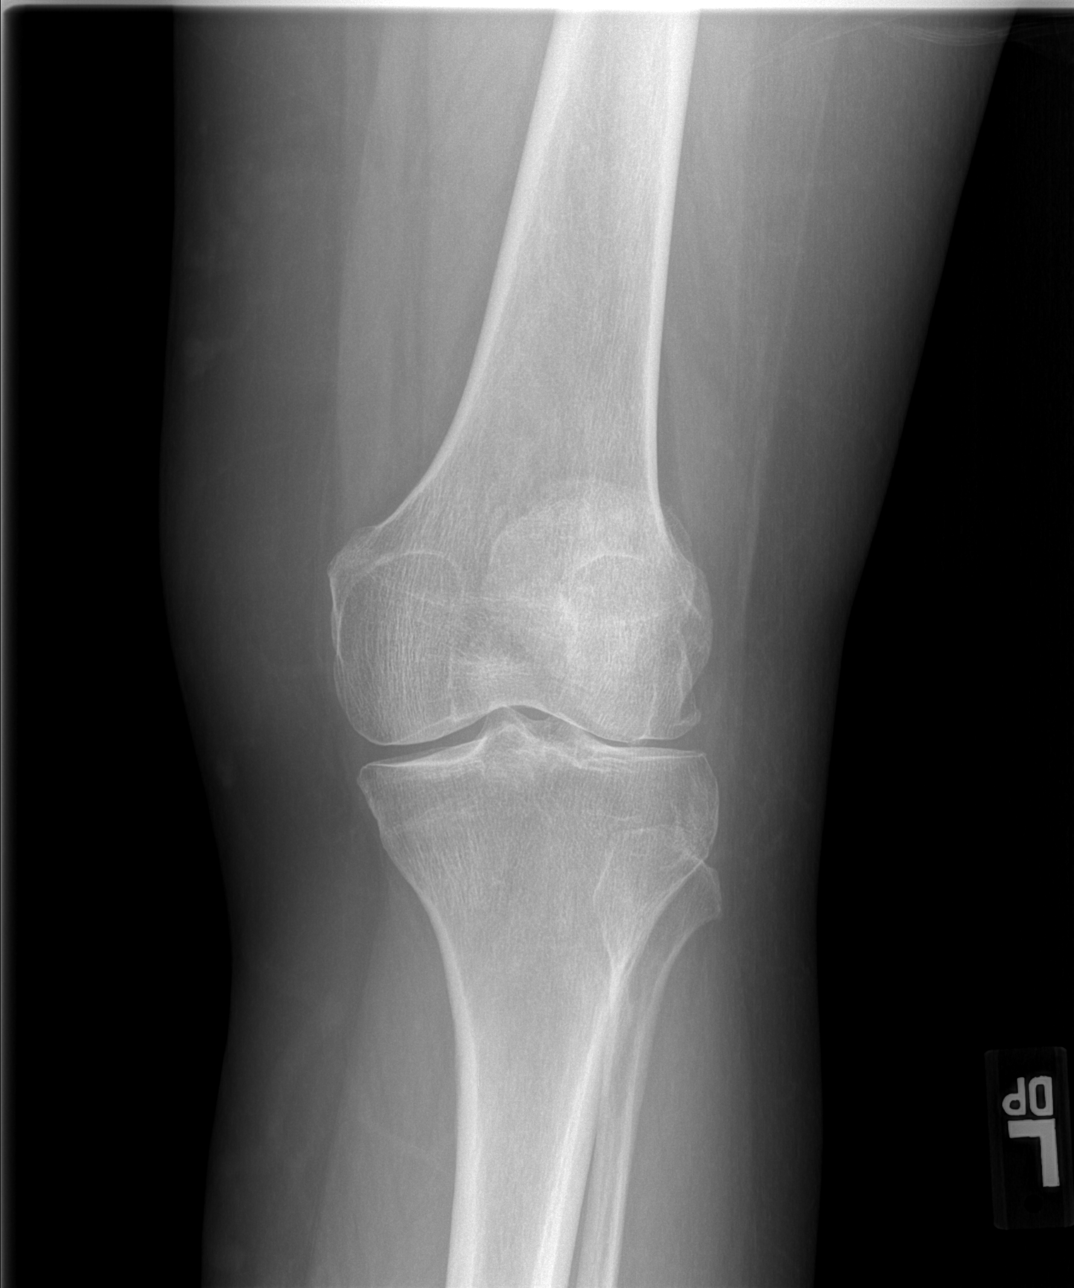

[t knee oblique left (1 of 2)]
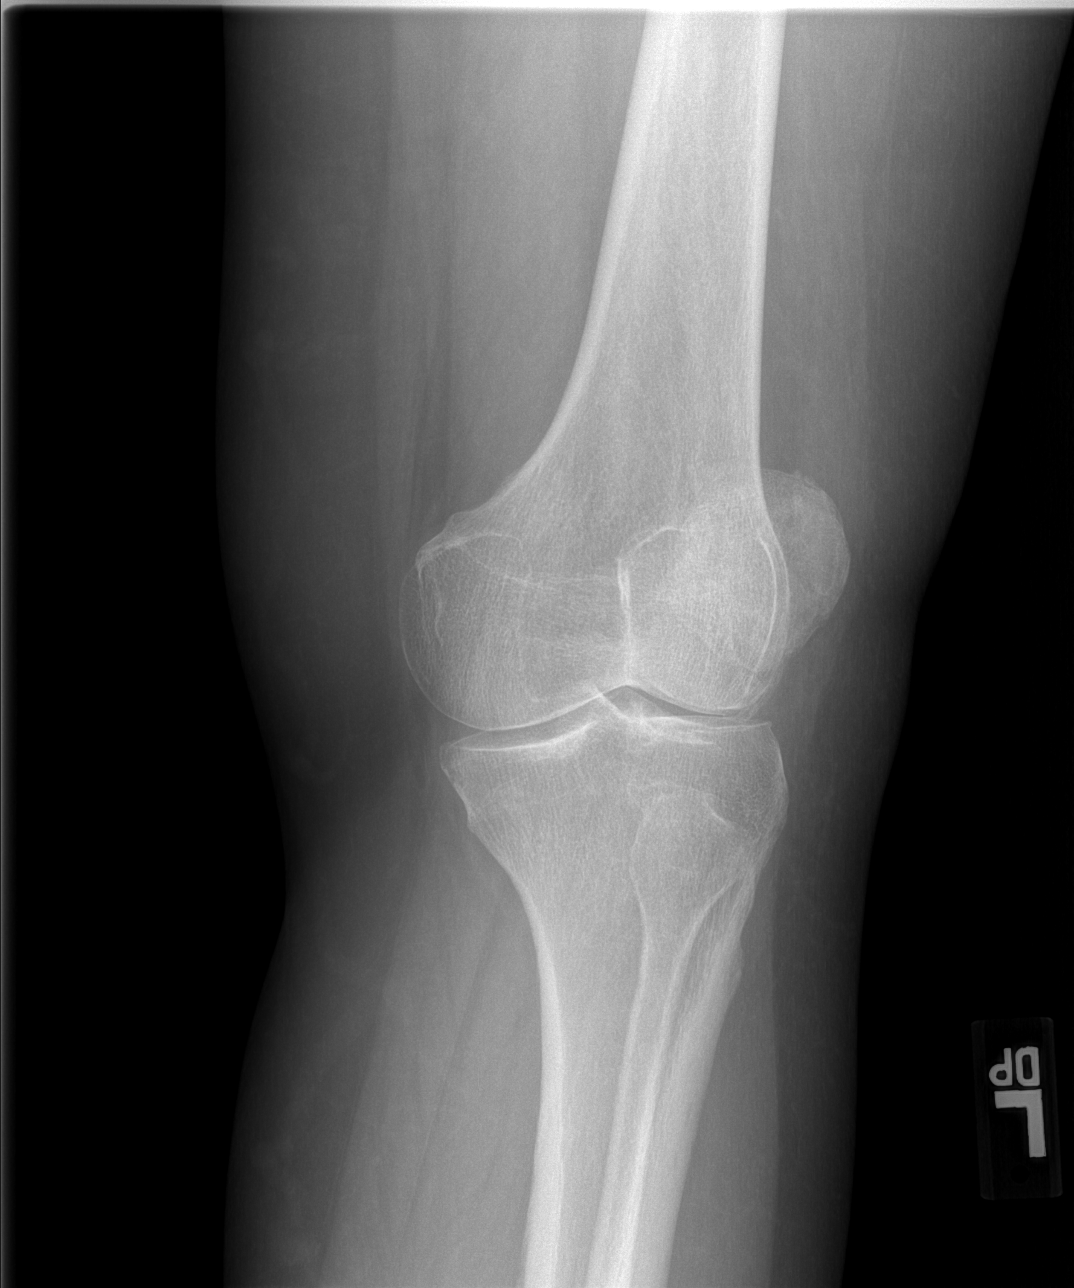

[t knee oblique left (2 of 2)]
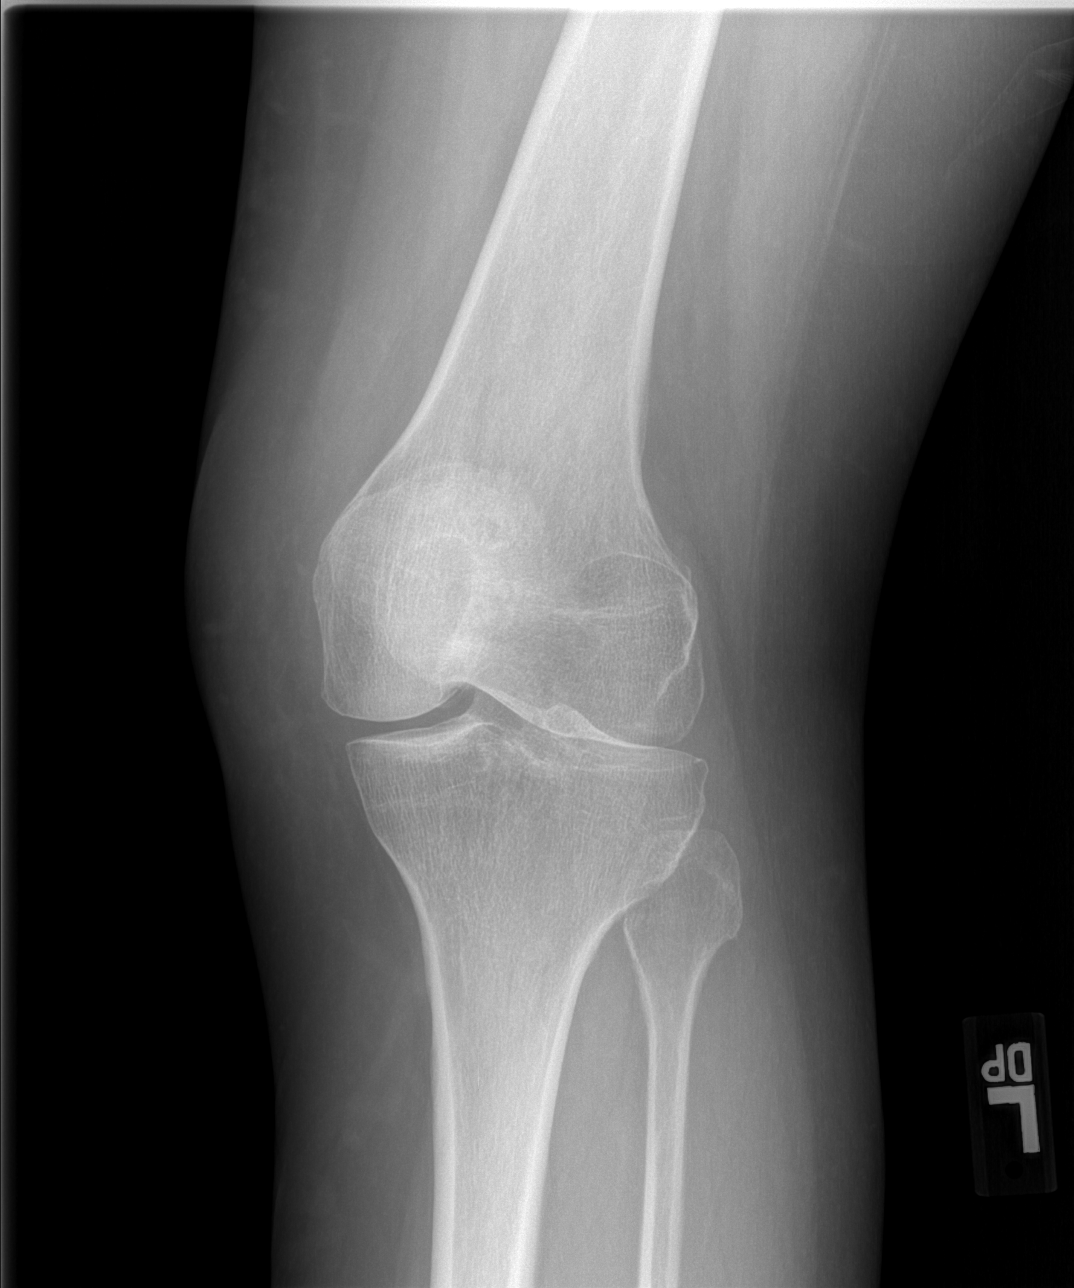

[t knee lat left]
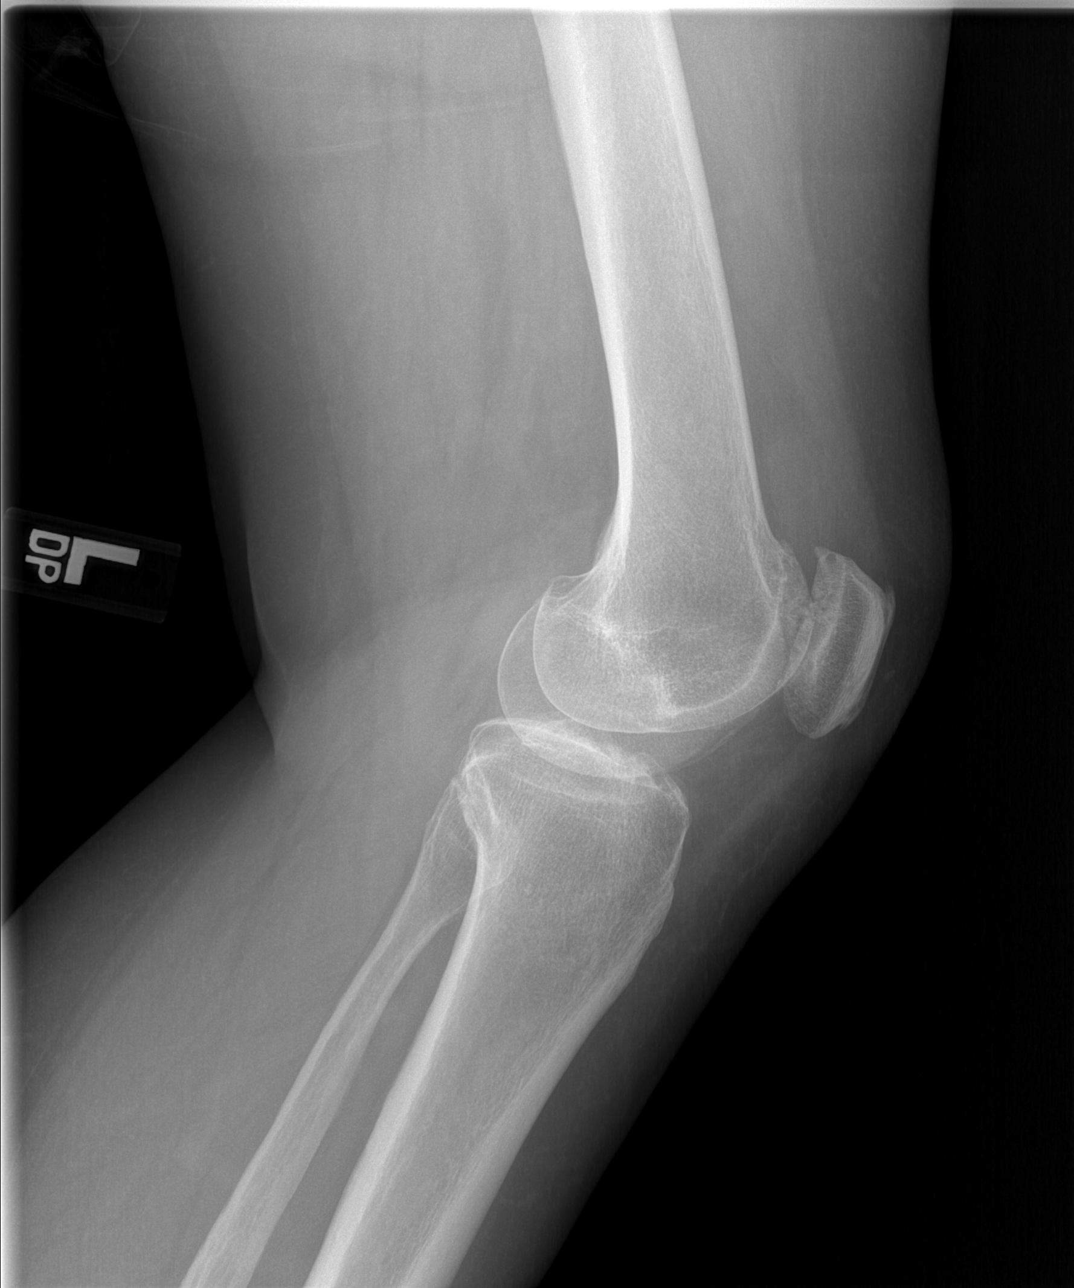

[4 of 4 positions shown; findings below may reference images not displayed]

FINDINGS: There is no evidence of fracture or dislocation. Minimal
suprapatellar joint effusion is noted. Moderate narrowing and
osteophyte formation of lateral joint space is noted. Mild narrowing
and osteophyte formation of patellofemoral space is noted. Soft
tissues are unremarkable.
IMPRESSION: Moderate degenerative joint disease. No acute abnormality seen in
the left knee.

## 2015-05-27 ENCOUNTER — Other Ambulatory Visit: Payer: Self-pay | Admitting: Family Medicine

## 2015-06-18 ENCOUNTER — Encounter: Payer: Self-pay | Admitting: Family Medicine

## 2015-06-18 LAB — HM MAMMOGRAPHY

## 2015-06-25 ENCOUNTER — Other Ambulatory Visit: Payer: Self-pay | Admitting: Family Medicine

## 2015-06-25 ENCOUNTER — Other Ambulatory Visit: Payer: Self-pay

## 2015-06-25 NOTE — Telephone Encounter (Signed)
Is this okay to refill? 

## 2015-06-25 NOTE — Telephone Encounter (Signed)
Ok to renew?  

## 2015-07-22 ENCOUNTER — Other Ambulatory Visit: Payer: Self-pay | Admitting: Family Medicine

## 2015-07-22 NOTE — Telephone Encounter (Signed)
Is this okay to refill? 

## 2015-08-22 ENCOUNTER — Ambulatory Visit (INDEPENDENT_AMBULATORY_CARE_PROVIDER_SITE_OTHER): Payer: BC Managed Care – PPO | Admitting: Family Medicine

## 2015-08-22 ENCOUNTER — Ambulatory Visit: Payer: BC Managed Care – PPO | Admitting: Family Medicine

## 2015-08-22 ENCOUNTER — Encounter: Payer: Self-pay | Admitting: Family Medicine

## 2015-08-22 VITALS — BP 148/82 | HR 71 | Temp 98.3°F | Ht 63.75 in | Wt 203.2 lb

## 2015-08-22 DIAGNOSIS — R7302 Impaired glucose tolerance (oral): Secondary | ICD-10-CM

## 2015-08-22 DIAGNOSIS — J4530 Mild persistent asthma, uncomplicated: Secondary | ICD-10-CM | POA: Diagnosis not present

## 2015-08-22 DIAGNOSIS — J0110 Acute frontal sinusitis, unspecified: Secondary | ICD-10-CM

## 2015-08-22 DIAGNOSIS — E669 Obesity, unspecified: Secondary | ICD-10-CM

## 2015-08-22 DIAGNOSIS — Z1159 Encounter for screening for other viral diseases: Secondary | ICD-10-CM

## 2015-08-22 DIAGNOSIS — J301 Allergic rhinitis due to pollen: Secondary | ICD-10-CM

## 2015-08-22 DIAGNOSIS — M1990 Unspecified osteoarthritis, unspecified site: Secondary | ICD-10-CM

## 2015-08-22 DIAGNOSIS — E785 Hyperlipidemia, unspecified: Secondary | ICD-10-CM | POA: Diagnosis not present

## 2015-08-22 DIAGNOSIS — K219 Gastro-esophageal reflux disease without esophagitis: Secondary | ICD-10-CM | POA: Diagnosis not present

## 2015-08-22 DIAGNOSIS — I493 Ventricular premature depolarization: Secondary | ICD-10-CM | POA: Diagnosis not present

## 2015-08-22 LAB — CBC WITH DIFFERENTIAL/PLATELET
BASOS PCT: 0 % (ref 0–1)
Basophils Absolute: 0 10*3/uL (ref 0.0–0.1)
Eosinophils Absolute: 0.1 10*3/uL (ref 0.0–0.7)
Eosinophils Relative: 3 % (ref 0–5)
HEMATOCRIT: 40.4 % (ref 36.0–46.0)
HEMOGLOBIN: 13.2 g/dL (ref 12.0–15.0)
LYMPHS PCT: 25 % (ref 12–46)
Lymphs Abs: 1.1 10*3/uL (ref 0.7–4.0)
MCH: 26.2 pg (ref 26.0–34.0)
MCHC: 32.7 g/dL (ref 30.0–36.0)
MCV: 80.3 fL (ref 78.0–100.0)
MONO ABS: 0.5 10*3/uL (ref 0.1–1.0)
MONOS PCT: 12 % (ref 3–12)
MPV: 10.8 fL (ref 8.6–12.4)
NEUTROS ABS: 2.6 10*3/uL (ref 1.7–7.7)
NEUTROS PCT: 60 % (ref 43–77)
Platelets: 165 10*3/uL (ref 150–400)
RBC: 5.03 MIL/uL (ref 3.87–5.11)
RDW: 14.3 % (ref 11.5–15.5)
WBC: 4.4 10*3/uL (ref 4.0–10.5)

## 2015-08-22 LAB — LIPID PANEL
CHOLESTEROL: 177 mg/dL (ref 125–200)
HDL: 50 mg/dL (ref >=46)
LDL Cholesterol: 84 mg/dL (ref <130)
TRIGLYCERIDES: 215 mg/dL — AB (ref <150)
Total CHOL/HDL Ratio: 3.5 Ratio (ref <=5.0)
VLDL: 43 mg/dL — ABNORMAL HIGH (ref <30)

## 2015-08-22 LAB — COMPREHENSIVE METABOLIC PANEL
ALBUMIN: 4.1 g/dL (ref 3.6–5.1)
ALT: 32 U/L — ABNORMAL HIGH (ref 6–29)
AST: 31 U/L (ref 10–35)
Alkaline Phosphatase: 91 U/L (ref 33–130)
BILIRUBIN TOTAL: 0.3 mg/dL (ref 0.2–1.2)
BUN: 12 mg/dL (ref 7–25)
CALCIUM: 8.7 mg/dL (ref 8.6–10.4)
CO2: 27 mmol/L (ref 20–31)
CREATININE: 0.94 mg/dL (ref 0.50–0.99)
Chloride: 103 mmol/L (ref 98–110)
Glucose, Bld: 86 mg/dL (ref 65–99)
Potassium: 4.1 mmol/L (ref 3.5–5.3)
SODIUM: 140 mmol/L (ref 135–146)
TOTAL PROTEIN: 6.5 g/dL (ref 6.1–8.1)

## 2015-08-22 MED ORDER — AMOXICILLIN 875 MG PO TABS: 875.0000 mg | ORAL_TABLET | Freq: Two times a day (BID) | ORAL | Status: DC

## 2015-08-23 ENCOUNTER — Encounter: Payer: Self-pay | Admitting: Family Medicine

## 2015-08-23 LAB — HEMOGLOBIN A1C
Hgb A1c MFr Bld: 6.8 % — ABNORMAL HIGH (ref <5.7)
MEAN PLASMA GLUCOSE: 148 mg/dL — AB (ref <117)

## 2015-08-23 LAB — HEPATITIS C ANTIBODY: HCV Ab: NEGATIVE

## 2015-08-23 NOTE — Progress Notes (Signed)
   Subjective:    Patient ID: Denise [name], female    DOB: 25-Jul-1956, 60 y.o.   MRN: [id number]  HPI She complains of a ten-day history this started with nasal and chest congestion with one day of fever. Since then she has noted increased difficulty with rhinorrhea, PND, occasional cough and now upper tooth discomfort with malaise and fatigue. She does not smoke. She does have a history of allergic rhinitis which is not causing much trouble. She also has asthma and does use Advair and albuterol. She does use albuterol as a rescue. She does have a previous history of difficulty with PVCs but no difficulty recently. Her reflux symptoms seem to be under good control. She does also complain of arthritis mainly in her knees. Also recent blood work did show hemoglobin A1c of 6.5.   Review of Systems     Objective:   Physical Exam Alert and in no distress. Nasal mucosa is slightly pinkish with tenderness especially over left maxillary sinus Tympanic membranes and canals are normal. Pharyngeal area is normal. Neck is supple without adenopathy or thyromegaly. Cardiac exam shows a regular sinus rhythm without murmurs or gallops. Lungs are clear to auscultation.        Assessment & Plan:  Acute frontal sinusitis, recurrence not specified - Plan: amoxicillin (AMOXIL) 875 MG tablet  Extrinsic asthma, mild persistent, uncomplicated  Non-seasonal allergic rhinitis due to pollen  Obesity (BMI 30-39.9) - Plan: CBC with Differential/Platelet, Comprehensive metabolic panel, Lipid panel  PVC's (premature ventricular contractions)  Gastroesophageal reflux disease without esophagitis  Glucose intolerance (impaired glucose tolerance) - Plan: Hemoglobin A1C  Need for hepatitis C screening test - Plan: Hepatitis C antibody  Hyperlipidemia LDL goal <100 - Plan: Lipid panel  Arthritis  Allergic rhinitis due to pollen Continue on her present medication regimen and add Amoxil. She will call no  improvement. Follow-up planned concerning her hemoglobin A1c.

## 2015-08-26 ENCOUNTER — Encounter: Payer: BC Managed Care – PPO | Admitting: Family Medicine

## 2015-09-04 ENCOUNTER — Telehealth: Payer: Self-pay | Admitting: Family Medicine

## 2015-09-04 NOTE — Telephone Encounter (Signed)
Pt called & states hasn't heard from her lab results.  I gave her results and note per JCL that she needs appt follow up diabetes. She states she never goes on My Chart and I will deactivate that.  She can't come in this week but will call the first of the week and schedule an appt-lm

## 2015-09-06 ENCOUNTER — Telehealth: Payer: Self-pay | Admitting: Nurse Practitioner

## 2015-09-06 NOTE — Telephone Encounter (Signed)
Left message for appt/rd

## 2015-09-09 ENCOUNTER — Ambulatory Visit (INDEPENDENT_AMBULATORY_CARE_PROVIDER_SITE_OTHER): Payer: BC Managed Care – PPO | Admitting: Nurse Practitioner

## 2015-09-09 ENCOUNTER — Encounter: Payer: Self-pay | Admitting: Nurse Practitioner

## 2015-09-09 VITALS — BP 128/74 | HR 56 | Ht 64.0 in | Wt 204.0 lb

## 2015-09-09 DIAGNOSIS — Z0000 Encounter for general adult medical examination without abnormal findings: Secondary | ICD-10-CM | POA: Diagnosis not present

## 2015-09-09 DIAGNOSIS — Z01419 Encounter for gynecological examination (general) (routine) without abnormal findings: Secondary | ICD-10-CM

## 2015-09-09 MED ORDER — VITAMIN D (ERGOCALCIFEROL) 1.25 MG (50000 UNIT) PO CAPS: 50000.0000 [IU] | ORAL_CAPSULE | ORAL | Status: DC

## 2015-09-09 NOTE — Progress Notes (Signed)
Encounter reviewed by Dr. Brynlei Klausner Amundson C. Silva.  

## 2015-09-09 NOTE — Progress Notes (Signed)
Patient ID: Denise [name], female   DOB: 06-07-1956, 60 y.o.   MRN: [id number]  60 y.o. G0P0 Single  Caucasian Fe here for annual exam.  No new diagnosis.  Both feet hammer toe done 02/2015.  Same partner.  No concerns about STD's. No vaso symptoms.  Patient's last menstrual period was 04/25/1998 (exact date).          Sexually active: Yes.   Denise [name] The current method of family planning is none.    Exercising: No.  The patient does not participate in regular exercise at present. Smoker:  no  Health Maintenance: Pap: Hyst 1999 MMG: 06/18/15, 3D, Bi-Rads 2: benign findings Colonoscopy: 03/03/13, no polyps, repeat in 10 years BMD:  02/02/11, 0.3/0.0/0.7 TDaP: 04/02/11 Shingles: Never Pneumonia: Not indicated due to age 60 C and HIV: Hep C completed 08/22/15, HIV in 2002 Labs: Dr. Redmond School 08/22/15   reports that she has never smoked. She has never used smokeless tobacco. She reports that she drinks alcohol. She reports that she does not use illicit drugs.  Past Medical History  Diagnosis Date  . Asthma   . Allergy   . GERD (gastroesophageal reflux disease)   . Sleep apnea   . PVC's (premature ventricular contractions)   . Fibroids     TAH in 1999  . Bursitis of left hip     Past Surgical History  Procedure Laterality Date  . Cardiac cath  05/2007    J. BERRY  . Wisdom tooth extraction  mid teens  . Total abdominal hysterectomy  05/15/1998    secondary to fibroids  . Hammer toe surgery Bilateral 2016    Current Outpatient Prescriptions  Medication Sig Dispense Refill  . ADVAIR DISKUS 100-50 MCG/DOSE AEPB Inhale 1 puff into the lungs daily.     Marland Kitchen albuterol (PROVENTIL) (2.5 MG/3ML) 0.083% nebulizer solution Take 3 mLs (2.5 mg total) by nebulization every 6 (six) hours as needed. 75 mL 1  . atenolol (TENORMIN) 50 MG tablet TAKE 1 TABLET BY MOUTH TWICE A DAY 60 tablet 11  . diclofenac (VOLTAREN) 75 MG EC tablet TAKE 1 TABLET BY MOUTH TWICE A DAY WITH FOOD 60 tablet 1  .  esomeprazole (NEXIUM) 40 MG capsule Take 1 capsule (40 mg total) by mouth daily before breakfast. 100 capsule 0  . fluticasone (FLONASE) 50 MCG/ACT nasal spray Place 2 sprays into both nostrils daily. 16 g 11  . hydrochlorothiazide (MICROZIDE) 12.5 MG capsule TAKE ONE CAPSULE BY MOUTH EVERY DAY 30 capsule 1  . triazolam (HALCION) 0.25 MG tablet TAKE 1 TABLET BY MOUTH AT BEDTIME AS NEEDED FOR SLEEP 30 tablet 0  . Vitamin D, Ergocalciferol, (DRISDOL) 50000 UNITS CAPS capsule Take 1 capsule (50,000 Units total) by mouth every 7 (seven) days. 30 capsule 1   Current Facility-Administered Medications  Medication Dose Route Frequency Provider Last Rate Last Dose  . lidocaine (XYLOCAINE) 2 % (with pres) injection 20 mg  1 mL Intradermal Once Rita Ohara, MD      . triamcinolone acetonide (KENALOG-40) injection 40 mg  40 mg Intramuscular Once Rita Ohara, MD        Family History  Problem Relation Age of Onset  . Cancer Maternal Grandmother     GI cancer in 100's  . Dementia Mother   . Heart disease Father   . Heart failure Father     ROS:  Pertinent items are noted in HPI.  Otherwise, a comprehensive ROS was negative.  Exam:   BP 128/74  mmHg  Pulse 56  Ht 5\' 4"  (1.626 m)  Wt 204 lb (92.534 kg)  BMI 35.00 kg/m2  LMP 04/25/1998 (Exact Date) Height: 5\' 4"  (162.6 cm) Ht Readings from Last 3 Encounters:  09/09/15 5\' 4"  (1.626 m)  08/22/15 5' 3.75" (1.619 m)  09/06/14 5' 4.25" (1.632 m)    General appearance: alert, cooperative and appears stated age Head: Normocephalic, without obvious abnormality, atraumatic Neck: no adenopathy, supple, symmetrical, trachea midline and thyroid normal to inspection and palpation Lungs: clear to auscultation bilaterally Breasts: normal appearance, no masses or tenderness Heart: regular rate and rhythm Abdomen: soft, non-tender; no masses,  no organomegaly Extremities: extremities normal, atraumatic, no cyanosis or edema Skin: Skin color, texture, turgor  normal. No rashes or lesions Lymph nodes: Cervical, supraclavicular, and axillary nodes normal. No abnormal inguinal nodes palpated Neurologic: Grossly normal   Pelvic: External genitalia:  no lesions              Urethra:  normal appearing urethra with no masses, tenderness or lesions              Bartholin's and Skene's: normal                 Vagina: normal appearing vagina with normal color and discharge, no lesions              Cervix: absent              Pap taken: No. Bimanual Exam:  Uterus:  uterus absent              Adnexa: no mass, fullness, tenderness               Rectovaginal: Confirms               Anus:  normal sphincter tone, no lesions  Chaperone present: no  A:  Well Woman with normal exam  S/P TAH secondary to fibroids 05/1998 Postmenopausal on ERT from 05/2007 - 07/2013 HTN    P:   Reviewed health and wellness pertinent to exam  Pap smear as above  Mammogram is due 06/2016  Counseled on breast self exam, mammography screening, adequate intake of calcium and vitamin D, diet and exercise, Kegel's exercises return annually or prn  An After Visit Summary was printed and given to the patient.

## 2015-09-09 NOTE — Patient Instructions (Signed)

## 2015-09-11 ENCOUNTER — Other Ambulatory Visit: Payer: Self-pay | Admitting: Family Medicine

## 2015-09-24 ENCOUNTER — Telehealth: Payer: Self-pay | Admitting: Family Medicine

## 2015-09-24 MED ORDER — AZITHROMYCIN 500 MG PO TABS: 500.0000 mg | ORAL_TABLET | Freq: Every day | ORAL | Status: DC

## 2015-09-24 NOTE — Telephone Encounter (Signed)
Pt called and stated that she is still having issues with sinuses. She was given a rx for amoxicillin and has completed that. She states she is still blowing her nose a lot and coughing. She is not running any fever. She is some better. Pt states she would like a zpac to knock it out. [facility] [address] and can be reached at  [phone].

## 2015-09-24 NOTE — Telephone Encounter (Signed)
Let her know that I called in azithromycin

## 2015-09-24 NOTE — Telephone Encounter (Signed)
Spoke with patient & informed: Let her know that I called in azithromycin

## 2015-09-29 ENCOUNTER — Other Ambulatory Visit: Payer: Self-pay | Admitting: Family Medicine

## 2015-09-29 MED ORDER — AMOXICILLIN-POT CLAVULANATE 875-125 MG PO TABS: 1.0000 | ORAL_TABLET | Freq: Two times a day (BID) | ORAL | Status: DC

## 2015-09-29 NOTE — Progress Notes (Signed)
She got better on Amoxil and wanted to try a Zpack which did not work. I'll call in Augmentin and have her call me in 10 days

## 2015-11-09 ENCOUNTER — Other Ambulatory Visit: Payer: Self-pay | Admitting: Family Medicine

## 2015-11-12 ENCOUNTER — Other Ambulatory Visit: Payer: Self-pay | Admitting: Family Medicine

## 2015-11-12 NOTE — Telephone Encounter (Signed)
Called in med to pharmacy  

## 2015-11-12 NOTE — Telephone Encounter (Signed)
Is this okay to refill? 

## 2015-11-12 NOTE — Telephone Encounter (Signed)
Okay to renew

## 2015-11-13 ENCOUNTER — Other Ambulatory Visit: Payer: Self-pay | Admitting: Family Medicine

## 2015-11-13 NOTE — Telephone Encounter (Signed)
Is this okay to refill? 

## 2015-12-30 ENCOUNTER — Telehealth: Payer: Self-pay | Admitting: Family Medicine

## 2015-12-30 ENCOUNTER — Other Ambulatory Visit: Payer: Self-pay

## 2015-12-30 MED ORDER — HYDROCHLOROTHIAZIDE 12.5 MG PO CAPS: 12.5000 mg | ORAL_CAPSULE | Freq: Every day | ORAL | Status: DC

## 2015-12-30 NOTE — Telephone Encounter (Signed)
Sent med in took off about the appointment needed

## 2015-12-30 NOTE — Telephone Encounter (Signed)
Pt called and stated that her bottle of HCTZ states she needs an appt before anymore refills. She is questioning why. Please advise pt. [facility] uses [facility] and can be reached at [phone].

## 2015-12-30 NOTE — Telephone Encounter (Signed)
Go ahead and renew that. Take that off the label. Not sure what was there

## 2015-12-31 ENCOUNTER — Ambulatory Visit (INDEPENDENT_AMBULATORY_CARE_PROVIDER_SITE_OTHER): Payer: BC Managed Care – PPO | Admitting: Family Medicine

## 2015-12-31 ENCOUNTER — Encounter: Payer: Self-pay | Admitting: Family Medicine

## 2015-12-31 VITALS — BP 150/70 | HR 58 | Wt 195.4 lb

## 2015-12-31 DIAGNOSIS — R030 Elevated blood-pressure reading, without diagnosis of hypertension: Secondary | ICD-10-CM

## 2015-12-31 DIAGNOSIS — IMO0001 Reserved for inherently not codable concepts without codable children: Secondary | ICD-10-CM

## 2015-12-31 NOTE — Progress Notes (Signed)
   Subjective:    Patient ID: Denise [name], female    DOB: 11-14-1955, 60 y.o.   MRN: [id number]  HPI  he is here for evaluation of blood pressure. She was seen by her allergist and told her blood pressure was 180/100. They instructed her to come here. She is quite upset. Review of her record indicates relatively normal blood pressures in the past.   Review of Systems     Objective:   Physical Exam  alert and in no distress otherwise not examined       Assessment & Plan:  Elevated blood pressure  I discussed the elevated blood pressure with her in detail. She is having no other symptoms and this is just one reading. I will have her return here in roughly 1 month for a recheck. Explained that we are trying to prevent the long-term damage from elevated blood pressure and does not cause dizziness, headache or weakness.

## 2016-01-13 ENCOUNTER — Other Ambulatory Visit: Payer: Self-pay | Admitting: Family Medicine

## 2016-01-13 ENCOUNTER — Other Ambulatory Visit: Payer: Self-pay

## 2016-01-13 NOTE — Telephone Encounter (Signed)
Okay to renew

## 2016-01-13 NOTE — Telephone Encounter (Signed)
Is this okay?

## 2016-01-13 NOTE — Telephone Encounter (Signed)
Called med in 

## 2016-01-15 ENCOUNTER — Telehealth: Payer: Self-pay | Admitting: Internal Medicine

## 2016-01-15 NOTE — Telephone Encounter (Signed)
pls call pharmacy.   looking back in refill log, it looks like this is called out for #30 at a time.  I don't see any issue.   Find out what is the situation on the pharmacy's end?   If she getting this from another provider to, or is this just an oversight?

## 2016-01-15 NOTE — Telephone Encounter (Signed)
I'll defer this to you.  Not sure what you want her to use.   Chart shows she has used [place] prior.

## 2016-01-15 NOTE — Telephone Encounter (Signed)
Pt called to let us know that she can not pick up a rx for triazolam .25mg  for another 75 days and she is not sure why. [facility] called in med on Monday 01/13/16 but patient is saying they will not take it until 75 days and her last refill was back in April. Pt wants to be switched to something else. Send to [address]. Pt was advised Dr. Redmond School was not here but she wants it sent to someone else to see if it can be done

## 2016-01-15 NOTE — Telephone Encounter (Signed)
Spoke to pharmacy and they states pt's insurance will only allowe #30 tablets within 75 days. If patient needs more than 30 in 75 days then you have to do a prior auth through insurance computer to get approval.

## 2016-01-16 ENCOUNTER — Telehealth: Payer: Self-pay | Admitting: Family Medicine

## 2016-01-16 NOTE — Telephone Encounter (Signed)
Completed P.A. Triazolam

## 2016-01-16 NOTE — Telephone Encounter (Signed)
Pt called to to states that she really needing Triazolam as soon as we can help her. This entire situation with insurance and her med is not helping her at all. 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 Pt said was seen at the end of May and all meds were reviewed in relation to her blood pressure and her medical conditions

## 2016-01-16 NOTE — Telephone Encounter (Signed)
Called and spoke to pt to explain situation with her rx. Pt states she really needs this asap. Called Mickel Baas and ask about status and she stated she didn't know anything about situation. Forwarding message to her to start over ride process. Please keep pt informed of progress.

## 2016-01-16 NOTE — Telephone Encounter (Signed)
appt to discuss options

## 2016-01-17 NOTE — Telephone Encounter (Addendum)
Called [facility] t# [phone] and after 25 minutes was unable to reach person

## 2016-01-18 NOTE — Telephone Encounter (Signed)
Closed today will try again on Monday

## 2016-01-20 NOTE — Telephone Encounter (Signed)
Called [facility] t# [phone] states pt's insurance requires letter of medical necessity for quantity limits override. T# [phone] and fax# [phone] for appeals dept

## 2016-01-20 NOTE — Telephone Encounter (Signed)
Letter of medical necessity typed & faxed

## 2016-01-23 NOTE — Telephone Encounter (Signed)
Called Appeals department [phone] unable to reach a person, will try back later

## 2016-01-24 NOTE — Telephone Encounter (Signed)
After a 1 hour phone call to [place] finally figured out what happened with the P.A. & appeal.  When I originally sent the first P.A. The response said P.A. Wasn't required when the problem is they started the request but then their system crashed so it was not handled completely.  So they did a P.A. Over the phone and now I will resend the appeal letter. Called pt & informed

## 2016-01-27 NOTE — Telephone Encounter (Signed)
Received denial for the appeal for quantity limits over ride for Triazolam, states the denial of quantity limits exception is upheld due to coverage  for insomnia is limited to 10 tablets per 25 days.  Called pharmacy to see if there was a larger dosage that could be cut in half but none available.   Pt had asked about changing to a different insomnia agent but that would not help because the limitation is for all insomnia agents of 10 tabs per 25 days. Left message for pt.

## 2016-01-27 NOTE — Telephone Encounter (Signed)
Pt informed and she will try to cut in half and stretch them out and pay out of pocket when she has to.

## 2016-02-20 ENCOUNTER — Other Ambulatory Visit: Payer: Self-pay | Admitting: Family Medicine

## 2016-02-20 ENCOUNTER — Other Ambulatory Visit: Payer: BC Managed Care – PPO

## 2016-02-20 ENCOUNTER — Telehealth: Payer: Self-pay | Admitting: Family Medicine

## 2016-02-20 ENCOUNTER — Telehealth: Payer: Self-pay

## 2016-02-20 DIAGNOSIS — R21 Rash and other nonspecific skin eruption: Secondary | ICD-10-CM

## 2016-02-20 LAB — CBC WITH DIFFERENTIAL/PLATELET
BASOS ABS: 0 {cells}/uL (ref 0–200)
BASOS PCT: 0 %
EOS PCT: 2 %
Eosinophils Absolute: 164 cells/uL (ref 15–500)
HCT: 38.9 % (ref 35.0–45.0)
Hemoglobin: 12.9 g/dL (ref 11.7–15.5)
LYMPHS PCT: 32 %
Lymphs Abs: 2624 cells/uL (ref 850–3900)
MCH: 26.7 pg — AB (ref 27.0–33.0)
MCHC: 33.2 g/dL (ref 32.0–36.0)
MCV: 80.4 fL (ref 80.0–100.0)
MONOS PCT: 7 %
MPV: 10.8 fL (ref 7.5–12.5)
Monocytes Absolute: 574 cells/uL (ref 200–950)
NEUTROS ABS: 4838 {cells}/uL (ref 1500–7800)
Neutrophils Relative %: 59 %
PLATELETS: 209 10*3/uL (ref 140–400)
RBC: 4.84 MIL/uL (ref 3.80–5.10)
RDW: 14 % (ref 11.0–15.0)
WBC: 8.2 10*3/uL (ref 4.0–10.5)

## 2016-02-20 NOTE — Telephone Encounter (Signed)
Left message for pt labs were in system to call and make a nurse appointment to have drawn

## 2016-02-20 NOTE — Telephone Encounter (Signed)
Pt states that her allergist wants certain blood test performed and that she needs to see a dermatologist for rash on her legs. Advised pt to bring the paper by the office for review. Victorino December

## 2016-02-20 NOTE — Telephone Encounter (Signed)
Pt came in and dropped off lab orders requested by Allergist Dr. Tiajuana Amass. Please place orders in system if ok. Pt also states that Dr. Orvil Feil would like her to have a referral to a dermatologist. Pt was told to have that done by [name] would be the best way. Please call pt when orders are in and advise when referral complete. Pt can be reached at [phone].

## 2016-02-20 NOTE — Telephone Encounter (Signed)
Placed in his future order

## 2016-02-21 LAB — ANA, IFA COMPREHENSIVE PANEL
Anti Nuclear Antibody(ANA): NEGATIVE
ENA SM AB SER-ACNC: NEGATIVE
SM/RNP: NEGATIVE
SSA (Ro) (ENA) Antibody, IgG: <1
SSB (La) (ENA) Antibody, IgG: <1
Scleroderma (Scl-70) (ENA) Antibody, IgG: <1
ds DNA Ab: <1 IU/mL

## 2016-02-21 LAB — COMPREHENSIVE METABOLIC PANEL
ALK PHOS: 80 U/L (ref 33–130)
ALT: 17 U/L (ref 6–29)
AST: 18 U/L (ref 10–35)
Albumin: 3.9 g/dL (ref 3.6–5.1)
BUN: 18 mg/dL (ref 7–25)
CHLORIDE: 101 mmol/L (ref 98–110)
CO2: 24 mmol/L (ref 20–31)
CREATININE: 0.87 mg/dL (ref 0.50–0.99)
Calcium: 8.9 mg/dL (ref 8.6–10.4)
GLUCOSE: 98 mg/dL (ref 65–99)
POTASSIUM: 3.8 mmol/L (ref 3.5–5.3)
SODIUM: 138 mmol/L (ref 135–146)
TOTAL PROTEIN: 6.7 g/dL (ref 6.1–8.1)
Total Bilirubin: 0.3 mg/dL (ref 0.2–1.2)

## 2016-02-21 LAB — SEDIMENTATION RATE: SED RATE: 10 mm/h (ref 0–30)

## 2016-02-21 LAB — ANCA SCREEN W REFLEX TITER: ANCA Screen: NEGATIVE

## 2016-02-21 LAB — HIGH SENSITIVITY CRP: CRP HIGH SENSITIVITY: 5 mg/L — AB

## 2016-03-05 ENCOUNTER — Encounter: Payer: Self-pay | Admitting: Family Medicine

## 2016-03-05 ENCOUNTER — Other Ambulatory Visit: Payer: Self-pay | Admitting: Family Medicine

## 2016-03-05 ENCOUNTER — Ambulatory Visit (INDEPENDENT_AMBULATORY_CARE_PROVIDER_SITE_OTHER): Payer: BC Managed Care – PPO | Admitting: Family Medicine

## 2016-03-05 DIAGNOSIS — L309 Dermatitis, unspecified: Secondary | ICD-10-CM | POA: Diagnosis not present

## 2016-03-05 NOTE — Progress Notes (Signed)
   Subjective:    Patient ID: Denise [name], female    DOB: November 16, 1955, 60 y.o.   MRN: [id number]  HPI She is here to have a biopsy done. She has been seen by her allergist and multiple blood studies have been done all of which were negative. The exact etiology of the rash that she is having is still unclear.   Review of Systems     Objective:   Physical Exam Multiple slightly erythematous lesions with no true border noted on both legs.       Assessment & Plan:  Dermatitis  1 lesions was identified and injected with Xylocaine and epinephrine. A 2 mm punch biopsy was done. The wound was dressed.

## 2016-03-13 ENCOUNTER — Other Ambulatory Visit: Payer: Self-pay

## 2016-03-13 ENCOUNTER — Other Ambulatory Visit: Payer: Self-pay | Admitting: Family Medicine

## 2016-03-13 NOTE — Telephone Encounter (Signed)
Called in triazolam per [employer]

## 2016-03-13 NOTE — Telephone Encounter (Signed)
Pt called and stated that she is leaving to go out of town and needs this refilled as soon as possible.

## 2016-03-13 NOTE — Telephone Encounter (Signed)
Ok

## 2016-03-13 NOTE — Telephone Encounter (Signed)
Called in per jcl 

## 2016-03-13 NOTE — Telephone Encounter (Signed)
Pt informed

## 2016-03-13 NOTE — Telephone Encounter (Signed)
Is this okay to refill? 

## 2016-06-13 ENCOUNTER — Other Ambulatory Visit: Payer: Self-pay | Admitting: Family Medicine

## 2016-06-15 NOTE — Telephone Encounter (Signed)
LM on pt VCM she is due for f/u for HTN per 01/2016.

## 2016-06-22 ENCOUNTER — Other Ambulatory Visit: Payer: Self-pay | Admitting: Family Medicine

## 2016-06-22 ENCOUNTER — Telehealth: Payer: Self-pay | Admitting: Family Medicine

## 2016-06-22 MED ORDER — MELOXICAM 15 MG PO TABS: 15.0000 mg | ORAL_TABLET | Freq: Every day | ORAL | 0 refills | Status: DC

## 2016-06-22 NOTE — Telephone Encounter (Signed)
Pt wants to know if Diclofenac can be switched to Meloxicam.. Diclofenac hurts her stomach

## 2016-06-22 NOTE — Telephone Encounter (Signed)
ok 

## 2016-06-22 NOTE — Telephone Encounter (Signed)
Is this okay to refill? 

## 2016-07-06 ENCOUNTER — Ambulatory Visit (INDEPENDENT_AMBULATORY_CARE_PROVIDER_SITE_OTHER): Payer: BC Managed Care – PPO | Admitting: Family Medicine

## 2016-07-06 ENCOUNTER — Encounter: Payer: Self-pay | Admitting: Family Medicine

## 2016-07-06 ENCOUNTER — Other Ambulatory Visit: Payer: Self-pay

## 2016-07-06 VITALS — BP 114/70 | HR 72 | Wt 198.0 lb

## 2016-07-06 DIAGNOSIS — J301 Allergic rhinitis due to pollen: Secondary | ICD-10-CM

## 2016-07-06 DIAGNOSIS — Z8249 Family history of ischemic heart disease and other diseases of the circulatory system: Secondary | ICD-10-CM

## 2016-07-06 DIAGNOSIS — J4530 Mild persistent asthma, uncomplicated: Secondary | ICD-10-CM | POA: Diagnosis not present

## 2016-07-06 DIAGNOSIS — Z638 Other specified problems related to primary support group: Secondary | ICD-10-CM | POA: Diagnosis not present

## 2016-07-06 DIAGNOSIS — J45909 Unspecified asthma, uncomplicated: Secondary | ICD-10-CM

## 2016-07-06 MED ORDER — ALBUTEROL SULFATE HFA 108 (90 BASE) MCG/ACT IN AERS: 2.0000 | INHALATION_SPRAY | Freq: Four times a day (QID) | RESPIRATORY_TRACT | 0 refills | Status: DC | PRN

## 2016-07-06 MED ORDER — ALPRAZOLAM 0.25 MG PO TABS: 0.2500 mg | ORAL_TABLET | Freq: Two times a day (BID) | ORAL | 0 refills | Status: DC | PRN

## 2016-07-06 NOTE — Patient Instructions (Signed)
Use the serenity prayer

## 2016-07-06 NOTE — Progress Notes (Signed)
   Subjective:    Patient ID: Denise [name], female    DOB: [birth year], 60 y.o.   MRN: [id number]  HPI She is here complaining of chest tightness for the last 3 days. Prior to this she has been using her inhaler very rarely. She does continue on her Advair on a regular basis. She's had no fever, chills, earache, sore throat, chest pain or DOE. She has been under last stress dealing with her parents who were apparently in failing health and a brother who is causing problems. Apparently it is time for the parents to go into some kind of assisted living but they're fighting this as is her brother. It also like a refill on her Halcion however she is using this more for stress reduction and for sleep. During the encounter, she did become quite tearful talking about her parents and noted that when she stopped crying, the shortness of breath to go away. At the end of the encounter when I was walking out the door she then mentioned the fact that her father apparently had a heart attack in his mid 75s.  Review of Systems     Objective:   Physical Exam Alert and in no distress. Tympanic membranes and canals are normal. Pharyngeal area is normal. Neck is supple without adenopathy or thyromegaly. Cardiac exam shows a regular sinus rhythm without murmurs or gallops. Lungs are clear to auscultation.        Assessment & Plan:  Extrinsic asthma, mild persistent, uncomplicated  Stress due to family tension  Non-seasonal allergic rhinitis due to pollen, unspecified chronicity  Uncomplicated asthma, unspecified asthma severity, unspecified whether persistent  Family history of heart disease in female family member before age 29 I explained that her shortness of breath is really more psychological in nature especially since she did become less short of breath after she cried. Discussed learning to let go of stressful situations. I explained she has done her duty to explain what her thoughts and concerns  are and if the parents rejected, that's as good as can be expected. Recommended using the serenity prayer. I will give her Xanax to use on an as-needed basis. She is also to return here in the next month for a complete exam including further evaluation of the family history of heart disease.

## 2016-07-07 ENCOUNTER — Ambulatory Visit: Payer: BC Managed Care – PPO | Admitting: Family Medicine

## 2016-07-14 ENCOUNTER — Other Ambulatory Visit: Payer: Self-pay | Admitting: Family Medicine

## 2016-07-14 NOTE — Telephone Encounter (Signed)
Is this okay to refill? 

## 2016-07-16 ENCOUNTER — Other Ambulatory Visit: Payer: Self-pay | Admitting: Family Medicine

## 2016-07-18 ENCOUNTER — Other Ambulatory Visit: Payer: Self-pay | Admitting: Family Medicine

## 2016-07-20 NOTE — Telephone Encounter (Signed)
Is this okay to refill? 

## 2016-07-20 NOTE — Telephone Encounter (Signed)
I took care of this already

## 2016-08-18 ENCOUNTER — Telehealth: Payer: Self-pay | Admitting: Family Medicine

## 2016-08-18 NOTE — Telephone Encounter (Signed)
Recv'd fax from [facility] stating Atenolol is on back order and requesting change to #2 of the 25mg  tabs.  Per Dr. Redmond School call a different pharmacy.  I called [facility] t# [phone] & they have it in stock.  Called pt and she is ok to switch to Medtronic.  Called Atenolol into [facility] & went thru for $3.91.

## 2016-08-25 ENCOUNTER — Telehealth: Payer: Self-pay | Admitting: Family Medicine

## 2016-08-25 ENCOUNTER — Other Ambulatory Visit: Payer: Self-pay | Admitting: Family Medicine

## 2016-08-25 DIAGNOSIS — Z638 Other specified problems related to primary support group: Secondary | ICD-10-CM

## 2016-08-25 NOTE — Telephone Encounter (Signed)
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  She states she takes xanax for anxiety and really feels uncomfortable with being in front of people, she states she is fine with one on one.  She would like for you to write a letter for her stating this in order for her to get of having to do this testing.

## 2016-08-25 NOTE — Telephone Encounter (Signed)
Called xanax in per jcl 

## 2016-08-25 NOTE — Telephone Encounter (Signed)
Is this okay to refill? 

## 2016-08-25 NOTE — Telephone Encounter (Signed)
Okay 

## 2016-08-25 NOTE — Telephone Encounter (Signed)
Have her come in so we can discuss this. 

## 2016-08-28 NOTE — Telephone Encounter (Signed)
Pt is supposed to start the testing on Monday and can't come in before then.  She has an appointment with you on 2/5 but that will be after the testing is over.  Please advise

## 2016-08-28 NOTE — Telephone Encounter (Signed)
Make sure she does not run out of Xanax but I can't write a note without talking to her further about this.

## 2016-09-01 NOTE — Telephone Encounter (Signed)
Called pt & informed of JCL note and that refill had already been called in

## 2016-09-01 NOTE — Telephone Encounter (Signed)
Per note from Tammy, pt not able to reschedule appt, leave on 2/5

## 2016-09-14 ENCOUNTER — Encounter: Payer: Self-pay | Admitting: Nurse Practitioner

## 2016-09-14 ENCOUNTER — Ambulatory Visit (INDEPENDENT_AMBULATORY_CARE_PROVIDER_SITE_OTHER): Payer: BC Managed Care – PPO | Admitting: Nurse Practitioner

## 2016-09-14 ENCOUNTER — Ambulatory Visit (INDEPENDENT_AMBULATORY_CARE_PROVIDER_SITE_OTHER): Payer: BC Managed Care – PPO | Admitting: Family Medicine

## 2016-09-14 ENCOUNTER — Encounter: Payer: Self-pay | Admitting: Family Medicine

## 2016-09-14 VITALS — BP 116/68 | HR 60 | Ht 64.0 in | Wt 193.0 lb

## 2016-09-14 VITALS — BP 130/70 | HR 63 | Resp 16 | Wt 195.4 lb

## 2016-09-14 DIAGNOSIS — G4730 Sleep apnea, unspecified: Secondary | ICD-10-CM

## 2016-09-14 DIAGNOSIS — I493 Ventricular premature depolarization: Secondary | ICD-10-CM | POA: Diagnosis not present

## 2016-09-14 DIAGNOSIS — Z01419 Encounter for gynecological examination (general) (routine) without abnormal findings: Secondary | ICD-10-CM

## 2016-09-14 DIAGNOSIS — J4530 Mild persistent asthma, uncomplicated: Secondary | ICD-10-CM | POA: Diagnosis not present

## 2016-09-14 DIAGNOSIS — I10 Essential (primary) hypertension: Secondary | ICD-10-CM | POA: Diagnosis not present

## 2016-09-14 DIAGNOSIS — Z638 Other specified problems related to primary support group: Secondary | ICD-10-CM | POA: Diagnosis not present

## 2016-09-14 DIAGNOSIS — E669 Obesity, unspecified: Secondary | ICD-10-CM

## 2016-09-14 DIAGNOSIS — Z0000 Encounter for general adult medical examination without abnormal findings: Secondary | ICD-10-CM | POA: Diagnosis not present

## 2016-09-14 DIAGNOSIS — M1990 Unspecified osteoarthritis, unspecified site: Secondary | ICD-10-CM

## 2016-09-14 DIAGNOSIS — J301 Allergic rhinitis due to pollen: Secondary | ICD-10-CM | POA: Diagnosis not present

## 2016-09-14 DIAGNOSIS — Z8249 Family history of ischemic heart disease and other diseases of the circulatory system: Secondary | ICD-10-CM

## 2016-09-14 DIAGNOSIS — K219 Gastro-esophageal reflux disease without esophagitis: Secondary | ICD-10-CM

## 2016-09-14 DIAGNOSIS — R7302 Impaired glucose tolerance (oral): Secondary | ICD-10-CM

## 2016-09-14 LAB — COMPREHENSIVE METABOLIC PANEL
ALK PHOS: 80 U/L (ref 33–130)
ALT: 14 U/L (ref 6–29)
AST: 19 U/L (ref 10–35)
Albumin: 4.1 g/dL (ref 3.6–5.1)
BILIRUBIN TOTAL: 0.5 mg/dL (ref 0.2–1.2)
BUN: 11 mg/dL (ref 7–25)
CALCIUM: 9.1 mg/dL (ref 8.6–10.4)
CO2: 27 mmol/L (ref 20–31)
CREATININE: 0.68 mg/dL (ref 0.50–0.99)
Chloride: 101 mmol/L (ref 98–110)
GLUCOSE: 105 mg/dL — AB (ref 65–99)
Potassium: 4.4 mmol/L (ref 3.5–5.3)
SODIUM: 138 mmol/L (ref 135–146)
Total Protein: 7 g/dL (ref 6.1–8.1)

## 2016-09-14 LAB — LIPID PANEL
CHOLESTEROL: 157 mg/dL (ref <200)
HDL: 48 mg/dL — ABNORMAL LOW (ref >50)
LDL Cholesterol: 87 mg/dL (ref <100)
TRIGLYCERIDES: 108 mg/dL (ref <150)
Total CHOL/HDL Ratio: 3.3 Ratio (ref <5.0)
VLDL: 22 mg/dL (ref <30)

## 2016-09-14 LAB — POCT URINALYSIS DIPSTICK
Bilirubin, UA: NEGATIVE
GLUCOSE UA: NEGATIVE
Ketones, UA: NEGATIVE
NITRITE UA: NEGATIVE
Protein, UA: NEGATIVE
RBC UA: NEGATIVE
UROBILINOGEN UA: NEGATIVE
pH, UA: 7

## 2016-09-14 LAB — CBC
HCT: 39 % (ref 35.0–45.0)
Hemoglobin: 12.7 g/dL (ref 11.7–15.5)
MCH: 25.6 pg — AB (ref 27.0–33.0)
MCHC: 32.6 g/dL (ref 32.0–36.0)
MCV: 78.5 fL — AB (ref 80.0–100.0)
MPV: 9.3 fL (ref 7.5–12.5)
PLATELETS: 182 10*3/uL (ref 140–400)
RBC: 4.97 MIL/uL (ref 3.80–5.10)
RDW: 13.7 % (ref 11.0–15.0)
WBC: 4.2 10*3/uL (ref 3.8–10.8)

## 2016-09-14 LAB — HEMOGLOBIN A1C
Hgb A1c MFr Bld: 6.1 % — ABNORMAL HIGH (ref <5.7)
MEAN PLASMA GLUCOSE: 128 mg/dL

## 2016-09-14 LAB — HEMOGLOBIN, FINGERSTICK: Hemoglobin, fingerstick: 12.4 g/dL (ref 12.0–15.0)

## 2016-09-14 NOTE — Patient Instructions (Signed)
Applied the serenity prayer. Try calling Dr. Cindie Laroche and look into the dental appliance Try Tylenol first for your arthritis and then Mobic

## 2016-09-14 NOTE — Patient Instructions (Addendum)

## 2016-09-14 NOTE — Progress Notes (Signed)
   Subjective:    Patient ID: Denise [name], female    DOB: 07/19/1956, 61 y.o.   MRN: [id number]  HPI She is here for medication check. Underlying history of PVCs and has been evaluated by Dr. Gwenlyn Found in the past. Presently she is on atenolol for that. She apparently did have a cath done in 2008. There is a positive family history for heart disease. She also has an underlying history of OSA but does not like to use the appliances. She is under a lot of family stress dealing with her mother and being her caregiver. She is getting involved in a support group for this. She also uses Xanax to help with this. She also has occasional difficulty with performance anxiety especially with being in front of crowds of people. She does use Mobley for her arthritis and gets good relief of this. She uses her Nexium as needed for reflux symptoms. Her allergies and asthma are under good control. She rarely uses her SQ inhaler.   Review of Systems     Objective:   Physical Exam Alert and in no distress. Tympanic membranes and canals are normal. Pharyngeal area is normal. Neck is supple without adenopathy or thyromegaly. Cardiac exam shows a regular sinus rhythm without murmurs or gallops. Lungs are clear to auscultation. EKG is normal.     Assessment & Plan:  Stress due to family tension  Extrinsic asthma, mild persistent, uncomplicated  Non-seasonal allergic rhinitis due to pollen, unspecified chronicity  Gastroesophageal reflux disease without esophagitis  Obesity (BMI 30-39.9)  PVC's (premature ventricular contractions)  Sleep apnea, unspecified type  Arthritis  Family history of heart disease in female family member before age 11  Mild persistent extrinsic asthma without complication Recommend she check with Dr. Cindie Laroche inserting a dental appliance. Did recommend she use Tylenol first before going to the [facility]. Also discussed weight reduction to help with her arthritis. She will continue  to use her Nexium on an as-needed basis. Encouraged her to get involved with a support group for taking care of family members. Also discussed the use of Xanax and encouraged her to use that on an as-needed basis and also use the serenity prayer to deal with the stress. She will be set up to see Dr. Gwenlyn Found for follow-up concerning her underlying cardiac history. Continue on her allergy and asthma medications. Greater than 45 minutes, the entire time spent in counseling and coordination of care.

## 2016-09-14 NOTE — Progress Notes (Signed)
61 y.o. G0P0000 Significant Other Caucasian Fe here for annual exam.  Her and Denise [name] are considering getting married this yr.  They have been together for yrs.  She has no new health problems.  Patient's last menstrual period was 04/25/1998 (exact date).          Sexually active: Yes.    The current method of family planning is none. Female partner. Exercising: No.  The patient does not participate in regular exercise at present. Smoker:  no  Health Maintenance: Pap: Hyst 1999 MMG: 06/18/15, 3D, Bi-Rads 2: benign findings - was done in fall 2017 at [place] - will get ROI. Colonoscopy: 03/03/13, no polyps, repeat in 10 years BMD:  02/02/11, 0.3 Spine / 0.0 Right Femur Neck / 0.7 Left Femur Neck TDaP: 04/02/11 Shingles: Never Pneumonia: Not indicated due to age 18 C and HIV: Hep C completed 08/22/15, HIV in 2002 Labs: HB: 12.4   Urine: small of leuk's.   reports that she has never smoked. She has never used smokeless tobacco. She reports that she drinks about 0.6 - 1.2 oz of alcohol per week . She reports that she does not use drugs.  Past Medical History:  Diagnosis Date  . Allergy   . Asthma   . Bursitis of left hip   . Fibroids    TAH in 1999  . GERD (gastroesophageal reflux disease)   . PVC's (premature ventricular contractions)   . Sleep apnea     Past Surgical History:  Procedure Laterality Date  . CARDIAC CATH  05/2007   J. BERRY  . HAMMER TOE SURGERY Bilateral 2016  . TOTAL ABDOMINAL HYSTERECTOMY  05/15/1998   secondary to fibroids  . WISDOM TOOTH EXTRACTION  mid teens    Current Outpatient Prescriptions  Medication Sig Dispense Refill  . ADVAIR DISKUS 100-50 MCG/DOSE AEPB Inhale 1 puff into the lungs daily.     Marland Kitchen albuterol (PROVENTIL HFA;VENTOLIN HFA) 108 (90 Base) MCG/ACT inhaler Inhale 2 puffs into the lungs every 6 (six) hours as needed for wheezing or shortness of breath. 1 Inhaler 0  . albuterol (PROVENTIL) (2.5 MG/3ML) 0.083% nebulizer solution Take 3 mLs (2.5 mg  total) by nebulization every 6 (six) hours as needed. 75 mL 1  . ALPRAZolam (XANAX) 0.25 MG tablet TAKE 1 TABLET BY MOUTH TWICE A DAY AS NEEDED 20 tablet 0  . aspirin 81 MG chewable tablet Chew 81 mg by mouth daily.    Marland Kitchen esomeprazole (NEXIUM) 40 MG capsule Take 1 capsule (40 mg total) by mouth daily before breakfast. 100 capsule 0  . fluticasone (FLONASE) 50 MCG/ACT nasal spray Place 2 sprays into both nostrils daily. 16 g 11  . hydrochlorothiazide (MICROZIDE) 12.5 MG capsule TAKE ONE CAPSULE BY MOUTH EVERY DAY 30 capsule 11  . meloxicam (MOBIC) 15 MG tablet TAKE 1 TABLET BY MOUTH EVERY DAY 30 tablet 2  . atenolol (TENORMIN) 50 MG tablet Take 1 tablet by mouth 2 (two) times daily.  0   No current facility-administered medications for this visit.     Family History  Problem Relation Age of Onset  . Cancer Maternal Grandmother     GI cancer in 79's  . Dementia Mother   . Heart disease Father   . Heart failure Father     ROS:  Pertinent items are noted in HPI.  Otherwise, a comprehensive ROS was negative.  Exam:   BP 116/68 (BP Location: Right Arm, Patient Position: Sitting, Cuff Size: Large)   Pulse 60  Ht 5\' 4"  (1.626 m)   Wt 193 lb (87.5 kg)   LMP 04/25/1998 (Exact Date)   BMI 33.13 kg/m  Height: 5\' 4"  (162.6 cm) Ht Readings from Last 3 Encounters:  09/14/16 5\' 4"  (1.626 m)  09/09/15 5\' 4"  (1.626 m)  08/22/15 5' 3.75" (1.619 m)    General appearance: alert, cooperative and appears stated age Head: Normocephalic, without obvious abnormality, atraumatic Neck: no adenopathy, supple, symmetrical, trachea midline and thyroid normal to inspection and palpation Lungs: clear to auscultation bilaterally Breasts: normal appearance, no masses or tenderness Heart: regular rate and rhythm Abdomen: soft, non-tender; no masses,  no organomegaly Extremities: extremities normal, atraumatic, no cyanosis or edema Skin: Skin color, texture, turgor normal. No rashes or lesions Lymph nodes:  Cervical, supraclavicular, and axillary nodes normal. No abnormal inguinal nodes palpated Neurologic: Grossly normal   Pelvic: External genitalia:  no lesions              Urethra:  normal appearing urethra with no masses, tenderness or lesions              Bartholin's and Skene's: normal                 Vagina: normal appearing vagina with normal color and discharge, no lesions              Cervix: absent              Pap taken: No. Bimanual Exam:  Uterus:  uterus absent              Adnexa: no mass, fullness, tenderness               Rectovaginal: Confirms               Anus:  normal sphincter tone, no lesions  Chaperone present: yes  A:  Well Woman with normal exam  S/P TAH secondary to fibroids 05/1998 Postmenopausal on ERT from 05/2007 - 07/2013 HTN  Impaired glucose intolerance   P:   Reviewed health and wellness pertinent to exam  Pap smear not indicated  Mammogram is due 2018 - will get one from 2017  Will get labs and follow  She is seeing PCP this pm and will ask about new Shingles vaccine  Counseled on breast self exam, adequate intake of calcium and vitamin D, diet and exercise return annually or prn  An After Visit Summary was printed and given to the patient.

## 2016-09-15 LAB — THYROID PEROXIDASE ANTIBODY

## 2016-09-15 LAB — VITAMIN D 25 HYDROXY (VIT D DEFICIENCY, FRACTURES): Vit D, 25-Hydroxy: 29 ng/mL — ABNORMAL LOW (ref 30–100)

## 2016-09-15 LAB — HM MAMMOGRAPHY

## 2016-09-15 LAB — TSH: TSH: 2.1 m[IU]/L

## 2016-09-15 NOTE — Progress Notes (Signed)
Encounter reviewed by Dr. Brook Amundson C. Silva.  

## 2016-09-15 NOTE — Addendum Note (Signed)
Addended by: Arley Phenix L on: 09/15/2016 01:22 PM   Modules accepted: Orders

## 2016-09-16 ENCOUNTER — Telehealth: Payer: Self-pay | Admitting: Nurse Practitioner

## 2016-09-16 NOTE — Telephone Encounter (Signed)
All her labs are not in yet and will not report until all in.

## 2016-09-16 NOTE — Telephone Encounter (Signed)
Routing to Melvia Heaps CNM for review of lab results from 09/14/2016 as Kem Boroughs, FNP is out of the office today.

## 2016-09-16 NOTE — Telephone Encounter (Signed)
Patient calling for recent lab results.  She said she was told to expect them back today.

## 2016-09-18 NOTE — Telephone Encounter (Signed)
Left message to call [place] at [phone].  Notes Recorded by Kem Boroughs, FNP on 09/18/2016 at 8:35 AM EST Please let pt know that TSH, CBC is normal. The Vit D was still low at 29 but better than last year at 22. She does need to go on OTC Vit D 1000 IU daily.  The HGB AIC was better at 6.1 compared to last year at 6.8. But this still falls in the pre diabetic range. The glucose was 105 on the CMP. Rest of kidney and liver test were normal. The lipid panel was better with total cholesterol at 157 compared to 177. HDL was still low at 48 - needs cardio exercise.

## 2016-09-18 NOTE — Telephone Encounter (Signed)
Spoke with patient. Advised of results as seen below from Kem Boroughs, [place]. Patient is agreeable and verbalizes understanding.  Routing to provider for final review. Patient agreeable to disposition. Will close encounter.

## 2016-09-21 ENCOUNTER — Telehealth: Payer: Self-pay | Admitting: Family Medicine

## 2016-09-21 NOTE — Telephone Encounter (Signed)
Make sure she gets scheduled

## 2016-09-21 NOTE — Telephone Encounter (Signed)
Pt left message that she had received a call from Dr. Naida Sleight office and that Dr Alvester Chou will not respond to Dr. Redmond School or look at the EKG.  And for pt to call and make an appt if needed.

## 2016-09-21 NOTE — Telephone Encounter (Signed)
Patient informed and verbalized understanding and is making appointment

## 2016-09-21 NOTE — Telephone Encounter (Signed)
Dr.Lalonde I talked with the scheduler she said Ms.Pozo didn't want to come in unless Dr.Berry looked at EKG and to let her know if she needs an appointment and she will be considered a new pt I will call pt and explain it to her

## 2016-09-23 ENCOUNTER — Ambulatory Visit (INDEPENDENT_AMBULATORY_CARE_PROVIDER_SITE_OTHER): Payer: BC Managed Care – PPO | Admitting: Medical

## 2016-09-23 ENCOUNTER — Encounter: Payer: Self-pay | Admitting: Medical

## 2016-09-23 VITALS — BP 134/76 | HR 65 | Temp 98.6°F | Wt 195.2 lb

## 2016-09-23 DIAGNOSIS — B029 Zoster without complications: Secondary | ICD-10-CM

## 2016-09-23 MED ORDER — VALACYCLOVIR HCL 1 G PO TABS: 1000.0000 mg | ORAL_TABLET | Freq: Three times a day (TID) | ORAL | 0 refills | Status: DC

## 2016-09-23 MED ORDER — HYDROCODONE-ACETAMINOPHEN 5-325 MG PO TABS: 1.0000 | ORAL_TABLET | Freq: Four times a day (QID) | ORAL | 0 refills | Status: DC | PRN

## 2016-09-23 NOTE — Progress Notes (Signed)
  Subjective:  Denise [name] is a 61 y.o. female who presents with possible shingles.  She thinks she had similar shingles 12 years ago.    She notes right lower abdomen/flank with 1 day hx/o pain, irritation, and now has small band of blisters/redness along right lower abdomen.    Quite painful.  May have felt feverish and achy today and yesterday.  She has had some mild cough and chest congestion recently.    Thinks she may have had cold and cough symptom recently.  Few weeks ago had more stress, mild illness.   Using nothing for symptoms.  Thinks she had similar shingles 12 years ago.  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   No other c/o.  The following portions of the patient's history were reviewed and updated as appropriate: allergies, current medications, past family history, past medical history, past social history, past surgical history and problem list.  ROS Otherwise as in subjective above  Objective:Exam BP 134/76   Pulse 65   Temp 98.6 F (37 C)   Wt 195 lb 3.2 oz (88.5 kg)   LMP 04/25/1998 (Exact Date)   SpO2 99%   BMI 33.51 kg/m    General appearance: alert, no distress, WD/WN HEENT: normocephalic, sclerae anicteric, conjunctiva pink and moist, TMs pearly, nares patent, no discharge or erythema, pharynx normal Oral cavity: MMM, no lesions Neck: supple, no lymphadenopathy, no thyromegaly, no masses Heart: RRR, normal S1, S2, no murmurs Lungs: CTA bilaterally, no wheezes, rhonchi, or rales Right lower lateral abdomen from posterior to anterior with  Abdomen: +bs, soft, non tender, non distended, no masses, no hepatomegaly, no splenomegaly Pulses: 2+ radial pulses, 2+ pedal pulses, normal cap refill Ext: no edema   Assessment: Encounter Diagnosis  Name Primary?  . Herpes zoster without complication Yes    Plan: Discussed the symptoms, exam findings, suggestive of shingles outbreak.  discussed transmission, precautions to prevent spread to others,  particularly high risk groups as discussed including young children, elderly, immunocompromised people, or pregnant women.  Discussed treatment including relative rest, hydration, can use OTC Cortaid topically but don't let others use the cream and discard the cream after this episode of shingles.   Begin Valtrex as below, can use NSAID OTC, and can use Norco for worse pain sparingly.   discussed the possibility of post herpetic neuralgia.   answered their questions, After visit summary given.   Follow up: 2 wk  Denise [name] was seen today for possible shingles , coughing.  Diagnoses and all orders for this visit:  Herpes zoster without complication  Other orders -     valACYclovir (VALTREX) 1000 MG tablet; Take 1 tablet (1,000 mg total) by mouth 3 (three) times daily. -     HYDROcodone-acetaminophen (NORCO) 5-325 MG tablet; Take 1 tablet by mouth every 6 (six) hours as needed for moderate pain.

## 2016-09-23 NOTE — Patient Instructions (Signed)
Shingles Shingles, which is also known as herpes zoster, is an infection that causes a painful skin rash and fluid-filled blisters. Shingles is not related to genital herpes, which is a sexually transmitted infection. Shingles only develops in people who:  Have had chickenpox.  Have received the chickenpox vaccine. (This is rare.)  What are the causes? Shingles is caused by varicella-zoster virus (VZV). This is the same virus that causes chickenpox. After exposure to VZV, the virus stays in the body in an inactive (dormant) state. Shingles develops if the virus reactivates. This can happen many years after the initial exposure to VZV. It is not known what causes this virus to reactivate. What increases the risk? People who have had chickenpox or received the chickenpox vaccine are at risk for shingles. Infection is more common in people who:  Are older than age 50.  Have a weakened defense (immune) system, such as those with HIV, AIDS, or cancer.  Are taking medicines that weaken the immune system, such as transplant medicines.  Are under great stress.  What are the signs or symptoms? Early symptoms of this condition include itching, tingling, and pain in an area on your skin. Pain may be described as burning, stabbing, or throbbing. A few days or weeks after symptoms start, a painful red rash appears, usually on one side of the body in a bandlike or beltlike pattern. The rash eventually turns into fluid-filled blisters that break open, scab over, and dry up in about 2-3 weeks. At any time during the infection, you may also develop:  A fever.  Chills.  A headache.  An upset stomach.  How is this diagnosed? This condition is diagnosed with a skin exam. Sometimes, skin or fluid samples are taken from the blisters before a diagnosis is made. These samples are examined under a microscope or sent to a lab for testing. How is this treated? There is no specific cure for this condition.  Your health care provider will probably prescribe medicines to help you manage pain, recover more quickly, and avoid long-term problems. Medicines may include:  Antiviral drugs.  Anti-inflammatory drugs.  Pain medicines.  If the area involved is on your face, you may be referred to a specialist, such as an eye doctor (ophthalmologist) or an ear, nose, and throat (ENT) doctor to help you avoid eye problems, chronic pain, or disability. Follow these instructions at home: Medicines  Take medicines only as directed by your health care provider.  Apply an anti-itch or numbing cream to the affected area as directed by your health care provider. Blister and Rash Care  Take a cool bath or apply cool compresses to the area of the rash or blisters as directed by your health care provider. This may help with pain and itching.  Keep your rash covered with a loose bandage (dressing). Wear loose-fitting clothing to help ease the pain of material rubbing against the rash.  Keep your rash and blisters clean with mild soap and cool water or as directed by your health care provider.  Check your rash every day for signs of infection. These include redness, swelling, and pain that lasts or increases.  Do not pick your blisters.  Do not scratch your rash. General instructions  Rest as directed by your health care provider.  Keep all follow-up visits as directed by your health care provider. This is important.  Until your blisters scab over, your infection can cause chickenpox in people who have never had it or been vaccinated   against it. To prevent this from happening, avoid contact with other people, especially: ? Babies. ? Pregnant women. ? Children who have eczema. ? Elderly people who have transplants. ? People who have chronic illnesses, such as leukemia or AIDS. Contact a health care provider if:  Your pain is not relieved with prescribed medicines.  Your pain does not get better after  the rash heals.  Your rash looks infected. Signs of infection include redness, swelling, and pain that lasts or increases. Get help right away if:  The rash is on your face or nose.  You have facial pain, pain around your eye area, or loss of feeling on one side of your face.  You have ear pain or you have ringing in your ear.  You have loss of taste.  Your condition gets worse. This information is not intended to replace advice given to you by your health care provider. Make sure you discuss any questions you have with your health care provider. Document Released: 07/27/2005 Document Revised: 03/22/2016 Document Reviewed: 06/07/2014 Elsevier Interactive Patient Education  2017 [employer]  

## 2016-09-27 ENCOUNTER — Other Ambulatory Visit: Payer: Self-pay | Admitting: Family Medicine

## 2016-09-27 MED ORDER — HYDROCODONE-ACETAMINOPHEN 5-325 MG PO TABS: 1.0000 | ORAL_TABLET | Freq: Four times a day (QID) | ORAL | 0 refills | Status: DC | PRN

## 2016-09-27 NOTE — Progress Notes (Signed)
She has Shingles and needs more codeine

## 2016-10-05 ENCOUNTER — Telehealth: Payer: Self-pay | Admitting: Family Medicine

## 2016-10-05 NOTE — Telephone Encounter (Signed)
Pt called and states that she needs a work  Note releasing her to go back to work, she is going back to work half days on wednesdays, then going back ftull but her job needs a note stating that she will not infect any one, she states that they are dried up, she can be reached at [phone] with any questions

## 2016-10-05 NOTE — Telephone Encounter (Signed)
Give her a return to work note but she should be able to go back full-time so check with her on why she thinks she only needs part-time

## 2016-10-06 ENCOUNTER — Telehealth: Payer: Self-pay | Admitting: Family Medicine

## 2016-10-06 ENCOUNTER — Other Ambulatory Visit: Payer: Self-pay | Admitting: Family Medicine

## 2016-10-06 ENCOUNTER — Encounter: Payer: Self-pay | Admitting: Family Medicine

## 2016-10-06 DIAGNOSIS — Z638 Other specified problems related to primary support group: Secondary | ICD-10-CM

## 2016-10-06 NOTE — Telephone Encounter (Signed)
Ok

## 2016-10-06 NOTE — Telephone Encounter (Signed)
Is this okay to refill? 

## 2016-10-06 NOTE — Telephone Encounter (Signed)
I discussed with Dr. Redmond School.  I will send out letter.  Ok if she needs to build up stamina for part time.  Also if scabbed over no longer infectious per Dr. Redmond School.

## 2016-10-06 NOTE — Telephone Encounter (Signed)
Work with Mickel Baas on this

## 2016-10-06 NOTE — Telephone Encounter (Signed)
Pt called and states she is getting over the shingles virus.  She still doesn't feel good.  She is wanting to return to work and needs a letter stating ok to return, that she will not infect anyone.  She was out of work for 10 days.  She would like to start back 1/2 days to build up her stamina.  Please email letter to [email or web address]  Pt ph [phone]

## 2016-10-06 NOTE — Telephone Encounter (Signed)
Letter sent.

## 2016-10-06 NOTE — Telephone Encounter (Signed)
Called both meds in

## 2016-10-07 ENCOUNTER — Ambulatory Visit: Payer: Self-pay | Admitting: Cardiovascular Disease

## 2016-10-12 ENCOUNTER — Telehealth: Payer: Self-pay | Admitting: Family Medicine

## 2016-10-12 NOTE — Telephone Encounter (Signed)
She is obviously having a pretty significant reaction. Let her know that her strength should slowly come back. If she needs a note for the next week or 2 to work half days, go ahead and write it

## 2016-10-12 NOTE — Telephone Encounter (Signed)
Pt called and states the shingles spots are gone.  However she continues to feel like crap.  She can only work 1/2 day as she has to go home and sleep.  She sleeps the rest of the afternoon.  After supper and then at bed time.  Can you give her some advice?  [phone]

## 2016-10-12 NOTE — Telephone Encounter (Signed)
Pt informed and said she didn't need a note as of now she just wanted to make sure there was nothing else she could do.

## 2016-10-12 NOTE — Telephone Encounter (Signed)
Pt informed and verbalized understanding

## 2016-10-12 NOTE — Telephone Encounter (Signed)
Let her know that if she does not continue to improve, we will need to revisit this

## 2016-10-13 ENCOUNTER — Telehealth: Payer: Self-pay | Admitting: Family Medicine

## 2016-10-13 ENCOUNTER — Encounter: Payer: Self-pay | Admitting: Family Medicine

## 2016-10-13 NOTE — Telephone Encounter (Signed)
Pt called and states she needs letter to state when she was first missed work, which was 2/14 and went back to work 1/2 days on 10/07/16.  And that she may need add'l 1/2 days to recover and to email to pt.  Letter completed and emailed.

## 2016-10-15 ENCOUNTER — Ambulatory Visit
Admission: RE | Admit: 2016-10-15 | Discharge: 2016-10-15 | Disposition: A | Discharge status: Home / Self Care | Payer: BC Managed Care – PPO | Source: Ambulatory Visit | Attending: Family Medicine | Admitting: Family Medicine

## 2016-10-15 ENCOUNTER — Ambulatory Visit (INDEPENDENT_AMBULATORY_CARE_PROVIDER_SITE_OTHER): Payer: BC Managed Care – PPO | Admitting: Family Medicine

## 2016-10-15 ENCOUNTER — Other Ambulatory Visit: Payer: Self-pay | Admitting: Family Medicine

## 2016-10-15 VITALS — BP 130/70 | HR 77 | Temp 98.4°F | Resp 18 | Wt 191.8 lb

## 2016-10-15 DIAGNOSIS — R590 Localized enlarged lymph nodes: Secondary | ICD-10-CM

## 2016-10-15 DIAGNOSIS — Z8619 Personal history of other infectious and parasitic diseases: Secondary | ICD-10-CM

## 2016-10-15 DIAGNOSIS — R0609 Other forms of dyspnea: Secondary | ICD-10-CM

## 2016-10-15 LAB — CBC WITH DIFFERENTIAL/PLATELET
Basophils Absolute: 0 cells/uL (ref 0–200)
Basophils Relative: 0 %
EOS ABS: 40 {cells}/uL (ref 15–500)
Eosinophils Relative: 1 %
HCT: 36.8 % (ref 35.0–45.0)
HEMOGLOBIN: 12 g/dL (ref 11.7–15.5)
LYMPHS ABS: 680 {cells}/uL — AB (ref 850–3900)
Lymphocytes Relative: 17 %
MCH: 24.9 pg — ABNORMAL LOW (ref 27.0–33.0)
MCHC: 32.6 g/dL (ref 32.0–36.0)
MCV: 76.5 fL — ABNORMAL LOW (ref 80.0–100.0)
MONO ABS: 480 {cells}/uL (ref 200–950)
MPV: 9.4 fL (ref 7.5–12.5)
Monocytes Relative: 12 %
NEUTROS ABS: 2800 {cells}/uL (ref 1500–7800)
Neutrophils Relative %: 70 %
Platelets: 190 10*3/uL (ref 140–400)
RBC: 4.81 MIL/uL (ref 3.80–5.10)
RDW: 14.5 % (ref 11.0–15.0)
WBC: 4 10*3/uL (ref 4.0–10.5)

## 2016-10-15 LAB — COMPREHENSIVE METABOLIC PANEL
ALBUMIN: 3.6 g/dL (ref 3.6–5.1)
ALK PHOS: 63 U/L (ref 33–130)
ALT: 8 U/L (ref 6–29)
AST: 16 U/L (ref 10–35)
BUN: 9 mg/dL (ref 7–25)
CHLORIDE: 96 mmol/L — AB (ref 98–110)
CO2: 25 mmol/L (ref 20–31)
CREATININE: 0.65 mg/dL (ref 0.50–0.99)
Calcium: 8.4 mg/dL — ABNORMAL LOW (ref 8.6–10.4)
Glucose, Bld: 135 mg/dL — ABNORMAL HIGH (ref 65–99)
POTASSIUM: 3.3 mmol/L — AB (ref 3.5–5.3)
Sodium: 132 mmol/L — ABNORMAL LOW (ref 135–146)
TOTAL PROTEIN: 6.2 g/dL (ref 6.1–8.1)
Total Bilirubin: 0.4 mg/dL (ref 0.2–1.2)

## 2016-10-15 LAB — BRAIN NATRIURETIC PEPTIDE: Brain Natriuretic Peptide: 89 pg/mL (ref <100)

## 2016-10-15 MED ORDER — IOPAMIDOL (ISOVUE-300) INJECTION 61%
75.0000 mL | Freq: Once | INTRAVENOUS | Status: AC | PRN
  Administered 2016-10-15: 75 mL via INTRAVENOUS

## 2016-10-15 NOTE — Progress Notes (Signed)
   Subjective:    Patient ID: Denise [name], female    DOB: September 04, 1955, 61 y.o.   MRN: [id number]  HPI She is here for follow-up visit. She was diagnosed with shingles on February 13. At this point the lesions have healed up much better and she is having some hyperesthesias/pins and needles feeling. She has had continued difficulty with fatigue, shortness of breath, DOE, slight cough as well as anorexia but no fever, chills, earache, sore throat, nausea or vomiting. She has noted some supraclavicular swelling in the last 7-10 days.   Review of Systems     Objective:   Physical Exam Alert and slightly tachypnea with a respiratory rate of 24. Supraclavicular swelling is noted when she sits however when she lies down the swelling goes away and then bilateral adenopathy can be palpable. Neck is supple without adenopathy. Abdominal exam shows no hepatosplenomegaly. No axillary or inguinal adenopathy. Cardiac exam shows regular rhythm without murmurs or gallops. Lungs clear to auscultation. Residual hyperpigmentation is noted in the area of previous history of shingles. EKG is normal      Assessment & Plan:  Supraclavicular adenopathy  DOE (dyspnea on exertion) - Plan: Brain natriuretic peptide, CBC with Differential/Platelet, Comprehensive metabolic panel, EKG 47-MRAJ, DG Chest 2 View, CANCELED: DG Chest 2 View  History of shingles Chest x-ray shows some streaking as well as paratracheal adenopathy. Case was discussed with Dr. Alvester Chou . CT chest with contrast was ordered. CT of chest shows extensive adenopathy and mass effect. Case was discussed with Dr. Burr Medico. She will be scheduled to be seen either today or tomorrow.

## 2016-10-16 ENCOUNTER — Other Ambulatory Visit: Payer: BC Managed Care – PPO

## 2016-10-16 ENCOUNTER — Encounter: Payer: Self-pay | Admitting: Hematology

## 2016-10-16 ENCOUNTER — Ambulatory Visit (HOSPITAL_BASED_OUTPATIENT_CLINIC_OR_DEPARTMENT_OTHER): Payer: BC Managed Care – PPO | Admitting: Hematology

## 2016-10-16 ENCOUNTER — Ambulatory Visit (HOSPITAL_BASED_OUTPATIENT_CLINIC_OR_DEPARTMENT_OTHER): Payer: BC Managed Care – PPO

## 2016-10-16 ENCOUNTER — Ambulatory Visit: Payer: Self-pay | Admitting: Cardiovascular Disease

## 2016-10-16 ENCOUNTER — Telehealth: Payer: Self-pay | Admitting: Hematology

## 2016-10-16 VITALS — BP 164/76 | HR 77 | Temp 98.0°F | Resp 16 | Wt 191.7 lb

## 2016-10-16 DIAGNOSIS — R590 Localized enlarged lymph nodes: Secondary | ICD-10-CM | POA: Diagnosis not present

## 2016-10-16 DIAGNOSIS — R05 Cough: Secondary | ICD-10-CM

## 2016-10-16 DIAGNOSIS — F419 Anxiety disorder, unspecified: Secondary | ICD-10-CM | POA: Diagnosis not present

## 2016-10-16 DIAGNOSIS — C8598 Non-Hodgkin lymphoma, unspecified, lymph nodes of multiple sites: Secondary | ICD-10-CM

## 2016-10-16 LAB — CBC & DIFF AND RETIC
BASO%: 0.2 % (ref 0.0–2.0)
Basophils Absolute: 0 10*3/uL (ref 0.0–0.1)
EOS%: 0.6 % (ref 0.0–7.0)
Eosinophils Absolute: 0 10*3/uL (ref 0.0–0.5)
HCT: 36.8 % (ref 34.8–46.6)
HEMOGLOBIN: 12.5 g/dL (ref 11.6–15.9)
IMMATURE RETIC FRACT: 3.6 % (ref 1.60–10.00)
LYMPH%: 13.1 % — AB (ref 14.0–49.7)
MCH: 25.3 pg (ref 25.1–34.0)
MCHC: 34 g/dL (ref 31.5–36.0)
MCV: 74.5 fL — AB (ref 79.5–101.0)
MONO#: 0.5 10*3/uL (ref 0.1–0.9)
MONO%: 11.6 % (ref 0.0–14.0)
NEUT#: 3.5 10*3/uL (ref 1.5–6.5)
NEUT%: 74.5 % (ref 38.4–76.8)
PLATELETS: 174 10*3/uL (ref 145–400)
RBC: 4.94 10*6/uL (ref 3.70–5.45)
RDW: 14 % (ref 11.2–14.5)
Retic %: 1.03 % (ref 0.70–2.10)
Retic Ct Abs: 50.88 10*3/uL (ref 33.70–90.70)
WBC: 4.6 10*3/uL (ref 3.9–10.3)
lymph#: 0.6 10*3/uL — ABNORMAL LOW (ref 0.9–3.3)

## 2016-10-16 LAB — LACTATE DEHYDROGENASE: LDH: 247 U/L — AB (ref 125–245)

## 2016-10-16 LAB — COMPREHENSIVE METABOLIC PANEL
ALBUMIN: 3.6 g/dL (ref 3.5–5.0)
ALK PHOS: 82 U/L (ref 40–150)
ALT: 12 U/L (ref 0–55)
ANION GAP: 10 meq/L (ref 3–11)
AST: 21 U/L (ref 5–34)
BUN: 8.3 mg/dL (ref 7.0–26.0)
CO2: 26 mEq/L (ref 22–29)
Calcium: 9.3 mg/dL (ref 8.4–10.4)
Chloride: 97 mEq/L — ABNORMAL LOW (ref 98–109)
Creatinine: 0.7 mg/dL (ref 0.6–1.1)
Glucose: 109 mg/dl (ref 70–140)
Potassium: 3.4 mEq/L — ABNORMAL LOW (ref 3.5–5.1)
Sodium: 133 mEq/L — ABNORMAL LOW (ref 136–145)
Total Bilirubin: 0.46 mg/dL (ref 0.20–1.20)
Total Protein: 7.1 g/dL (ref 6.4–8.3)

## 2016-10-16 LAB — URIC ACID: Uric Acid, Serum: 6.4 mg/dl (ref 2.6–7.4)

## 2016-10-16 MED ORDER — DULOXETINE HCL 30 MG PO CPEP: 30.0000 mg | ORAL_CAPSULE | Freq: Every day | ORAL | 3 refills | Status: DC

## 2016-10-16 MED ORDER — ACYCLOVIR 400 MG PO TABS: 400.0000 mg | ORAL_TABLET | Freq: Two times a day (BID) | ORAL | 2 refills | Status: DC

## 2016-10-16 MED ORDER — BENZONATATE 100 MG PO CAPS: 100.0000 mg | ORAL_CAPSULE | Freq: Three times a day (TID) | ORAL | 0 refills | Status: DC | PRN

## 2016-10-16 NOTE — Telephone Encounter (Signed)
Gave  Patient AVS. Patient sent to the lab after scheduling per 10/16/2016 los. Per RN Loren Scheduled patient to come back 10/22/2016 MD booked to schedule when we get an okay from MD on what time and day. Patient aware. To contact with date and time. [facility] to contact patient with CT / Pet Scan and ECHO.

## 2016-10-16 NOTE — Progress Notes (Signed)
Marland Kitchen    HEMATOLOGY/ONCOLOGY CONSULTATION NOTE  Date of Service: 10/16/2016  Patient Care Team: Denita Lung, MD as PCP - General (Family Medicine)  CHIEF COMPLAINTS/PURPOSE OF CONSULTATION:   lymphoma  HISTORY OF PRESENTING ILLNESS:   Denise [name] is a wonderful 61 y.o. female who has been referred to [name] by Dr .Wyatt Haste, MD  for evaluation and management of new concern for lymphoma.  Patient is a relatively healthy 61 year old female with a history of asthma, seasonal allergies, sleep apnea with questionable compliance with CPAP machine Who has been referred to [name] by Dr. Redmond School as an urgent consultation for concern of rapidly progressing lymphoma.  Patient had presented with shingles on her right posterior chest wall on 09/22/2016 along with some symptoms of chills night sweats decreased appetite and some shortness of breath and some enlarged lymph nodes in the neck.  She was treated with Valtrex with resolution of her painful right chest wall skin rash which has now crusted over but still remains somewhat uncomfortable.  She notes that she had continued difficulty with fatigue increasing shortness of breath and some sensations of nausea. She notes no objective fevers but has had chills and drenching night sweats for the last 23 weeks. She also has noted increasing swelling of her neck lymph nodes and a persistent cough that has been keeping her up at night.  She reports weight loss of about 7-8 pounds over the last 3 weeks and significant fatigue.   She is here with her female partner for further evaluation and management and is extremely anxious as is understandable .  She has had a CT of the chest with her primary care physician on 10/15/2016 which showed Bulky lymphadenopathy identified in the supraclavicular regions bilaterally, mediastinum, both hila, and upper abdomen. Imaging features highly suspicious for lymphoma. Metastatic disease considered less  likely.  Patient does not have any overt inspiratory stridor or wheezing at this time or overt respiratory distress .  MEDICAL HISTORY:  Past Medical History:  Diagnosis Date  . Allergy   . Asthma   . Bursitis of left hip   . Fibroids    TAH in 1999  . GERD (gastroesophageal reflux disease)   . PVC's (premature ventricular contractions)   . Sleep apnea     SURGICAL HISTORY: Past Surgical History:  Procedure Laterality Date  . CARDIAC CATH  05/2007   [facility]  . HAMMER TOE SURGERY Bilateral 2016  . TOTAL ABDOMINAL HYSTERECTOMY  05/15/1998   secondary to fibroids  . WISDOM TOOTH EXTRACTION  mid teens    SOCIAL HISTORY: Social History   Social History  . Marital status: Significant Other    Spouse name: N/A  . Number of children: 0  . Years of education: N/A   Occupational History  .  [employer] History Main Topics  . Smoking status: Never Smoker  . Smokeless tobacco: Never Used  . Alcohol use 0.6 - 1.2 oz/week    1 - 2 Standard drinks or equivalent per week     Comment: only if out to dinner, maybe once a month  . Drug use: No  . Sexual activity: Yes    Partners: Female    Birth control/ protection: None   Other Topics Concern  . Not on file   Social History Narrative  . No narrative on file    FAMILY HISTORY: Family History  Problem Relation Age of Onset  . Cancer Maternal Grandmother  GI cancer in 70's  . Dementia Mother   . Heart disease Father   . Heart failure Father     ALLERGIES:  is allergic to morphine and related.  MEDICATIONS:  Current Outpatient Prescriptions  Medication Sig Dispense Refill  . ADVAIR DISKUS 100-50 MCG/DOSE AEPB Inhale 1 puff into the lungs daily.     Marland Kitchen albuterol (PROVENTIL HFA;VENTOLIN HFA) 108 (90 Base) MCG/ACT inhaler Inhale 2 puffs into the lungs every 6 (six) hours as needed for wheezing or shortness of breath. 1 Inhaler 0  . ALPRAZolam (XANAX) 0.25 MG tablet TAKE 1 TABLET BY MOUTH TWICE A  DAY AS NEEDED FOR ANXIETY 20 tablet 0  . aspirin 81 MG chewable tablet Chew 81 mg by mouth daily.    Marland Kitchen atenolol (TENORMIN) 50 MG tablet Take 1 tablet by mouth 2 (two) times daily.  0  . esomeprazole (NEXIUM) 40 MG capsule Take 1 capsule (40 mg total) by mouth daily before breakfast. 100 capsule 0  . fluticasone (FLONASE) 50 MCG/ACT nasal spray Place 2 sprays into both nostrils daily. 16 g 11  . hydrochlorothiazide (MICROZIDE) 12.5 MG capsule TAKE ONE CAPSULE BY MOUTH EVERY DAY 30 capsule 11  . meloxicam (MOBIC) 15 MG tablet TAKE 1 TABLET BY MOUTH EVERY DAY 30 tablet 2  . triazolam (HALCION) 0.125 MG tablet Take 0.125 mg by mouth at bedtime.    . triazolam (HALCION) 0.25 MG tablet TAKE 1 TABLET BY MOUTH AT BEDTIME AS NEEDED FOR SLEEP (MAX 30/75 DAYS PER INS) 30 tablet 0  . acyclovir (ZOVIRAX) 400 MG tablet Take 1 tablet (400 mg total) by mouth 2 (two) times daily. 60 tablet 2  . benzonatate (TESSALON) 100 MG capsule Take 1 capsule (100 mg total) by mouth 3 (three) times daily as needed for cough. 30 capsule 0  . DULoxetine (CYMBALTA) 30 MG capsule Take 1 capsule (30 mg total) by mouth daily. 30 capsule 3   No current facility-administered medications for this visit.     REVIEW OF SYSTEMS:    10 Point review of Systems was done is negative except as noted above.  PHYSICAL EXAMINATION: ECOG PERFORMANCE STATUS: 1 - Symptomatic but completely ambulatory  . Vitals:   10/16/16 1036  BP: (!) 164/76  Pulse: 77  Resp: 16  Temp: 98 F (36.7 C)   Filed Weights   10/16/16 1036  Weight: 191 lb 11.2 oz (87 kg)   .Body mass index is 32.91 kg/m.  GENERAL:alert, in no acute distress and comfortable SKIN: skin color, texture, turgor are normal, no rashes or significant lesions EYES: normal, conjunctiva are pink and non-injected, sclera clear OROPHARYNX:no exudate, no erythema and lips, buccal mucosa, and tongue normal  NECK: supple, no JVD,No inspiratory stridor, no facial swelling or upper  extremity swelling. LYMPH: Extensive bilateral supraclavicular lymphadenopathy, no palpable inguinal or axillary lymphadenopathy noted. LUNGS: clear to auscultation with normal respiratory effort HEART: regular rate & rhythm,  no murmurs and no lower extremity edema ABDOMEN: abdomen soft, non-tender, normoactive bowel sounds  Musculoskeletal: no cyanosis of digits and no clubbing  PSYCH: alert & oriented x 3 with fluent speech NEURO: no focal motor/sensory deficits  LABORATORY DATA:  I have reviewed the data as listed  . CBC Latest Ref Rng & Units 10/16/2016 10/15/2016 09/14/2016  WBC 3.9 - 10.3 10e3/uL 4.6 4.0 4.2  Hemoglobin 11.6 - 15.9 g/dL 12.5 12.0 12.7  Hematocrit 34.8 - 46.6 % 36.8 36.8 39.0  Platelets 145 - 400 10e3/uL 174 190 182  CMP Latest Ref Rng & Units 10/16/2016 10/15/2016 09/14/2016  Glucose 70 - 140 mg/dl 109 135(H) 105(H)  BUN 7.0 - 26.0 mg/dL 8.3 9 11   Creatinine 0.6 - 1.1 mg/dL 0.7 0.65 0.68  Sodium 136 - 145 mEq/L 133(L) 132(L) 138  Potassium 3.5 - 5.1 mEq/L 3.4(L) 3.3(L) 4.4  Chloride 98 - 110 mmol/L - 96(L) 101  CO2 22 - 29 mEq/L 26 25 27   Calcium 8.4 - 10.4 mg/dL 9.3 8.4(L) 9.1  Total Protein 6.4 - 8.3 g/dL 7.1 6.2 7.0  Total Bilirubin 0.20 - 1.20 mg/dL 0.46 0.4 0.5  Alkaline Phos 40 - 150 U/L 82 63 80  AST 5 - 34 U/L 21 16 19   ALT 0 - 55 U/L 12 8 14    . Lab Results  Component Value Date   LDH 247 (H) 10/16/2016   Component     Latest Ref Rng & Units 10/16/2016  Uric Acid, Serum     2.6 - 7.4 mg/dl 6.4  Phosphorus     2.5 - 4.5 mg/dL 3.6  LDH     125 - 245 U/L 247 (H)  Hep C Virus Ab     0.0 - 0.9 s/co ratio <0.1  Hepatitis B Surface Ag     Negative Negative  Hep B Core Ab, Tot     Negative Negative  HIV     Non Reactive Non Reactive    RADIOGRAPHIC STUDIES: I have personally reviewed the radiological images as listed and agreed with the findings in the report. Dg Chest 2 View  Result Date: 10/15/2016 CLINICAL DATA:  Shortness of breath, cough  for 3 weeks EXAM: CHEST  2 VIEW COMPARISON:  None. FINDINGS: Cardiomediastinal silhouette is unremarkable. Mild mid thoracic dextroscoliosis. Mild lower thoracic levoscoliosis. There are degenerative changes thoracic spine. No pulmonary edema. There is streaky infiltrate in right upper lobe perihilar. This is highly suspicious for pneumonia. No convincing pulmonary edema. IMPRESSION: Degenerative changes and mild scoliosis thoracic spine. Streaky infiltrate right upper lobe perihilar highly suspicious for pneumonia. Followup PA and lateral chest X-ray is recommended in 3-4 weeks following trial of antibiotic therapy to ensure resolution and exclude underlying malignancy. Electronically Signed   By: Lahoma Crocker M.D.   On: 10/15/2016 10:35   Ct Chest W Contrast  Result Date: 10/15/2016 CLINICAL DATA:  Right parahilar lesions seen on chest x-ray EXAM: CT CHEST WITH CONTRAST TECHNIQUE: Multidetector CT imaging of the chest was performed during intravenous contrast administration. CONTRAST:  43mL ISOVUE-300 IOPAMIDOL (ISOVUE-300) INJECTION 61% COMPARISON:  Chest x-ray 10/15/2016 FINDINGS: Cardiovascular: The heart size is normal. No pericardial effusion. Coronary artery calcification is noted. Atherosclerotic calcification is noted in the wall of the thoracic aorta. Mediastinum/Nodes: Bulky supraclavicular lymphadenopathy is evident. Left supraclavicular nodal conglomeration measures 3.0 x 4.2 cm. Right supraclavicular nodal conglomeration is 3.4 x 3.2 cm. 4.2 x 4.0 cm nodal conglomeration in the superior mediastinum generates mass-effect on the right internal jugular vein at the level of the thoracic inlet. Subcarinal lymph node measures 2.5 cm short axis. Bulky lymphadenopathy is seen in both hilar regions. Lungs/Pleura: Right parahilar nodularity is seen along the inferior right hilum and tracking along the major fissure. 8 mm right middle lobe pulmonary nodule is associated. Left lung clear. Small right pleural  effusion noted. Upper Abdomen: Bulky retrocrural, gastrohepatic ligament and hepatoduodenal ligament lymphadenopathy is identified. 2.5 cm short axis retrocaval lymph node is identified in the right retroperitoneum. There is a dominant 3.0 cm short axis in the hepatoduodenal ligament.  Musculoskeletal: Bone windows reveal no worrisome lytic or sclerotic osseous lesions. IMPRESSION: 1. Bulky lymphadenopathy identified in the supraclavicular regions bilaterally, mediastinum, both hila, and upper abdomen. Imaging features highly suspicious for lymphoma. Metastatic disease considered less likely. 2. Parahilar abnormality on chest x-ray earlier today was due to nodularity in the lung parenchyma adjacent to the right hilum, mainly along the major fissure. 8 mm nodule identified central right middle lobe. 3. Small right pleural effusion. Electronically Signed   By: Misty Stanley M.D.   On: 10/15/2016 12:56    ASSESSMENT & PLAN:   61 year old female with  #1 Generalized lymphadenopathy with bulky lymphadenopathy identified in the supraclavicular regions bilaterally, mediastinum, both hila, and upper abdomen. Imaging features highly suspicious for lymphoma. If this is confirmed to be a lymphoma on biopsy this would suggest at least stage III lymphoma. Fatigue anorexia or night sweats-drenching, and weight loss suggest constitutional type B symptoms.  #2 Impending thoracic outlet obstruction due to bulky lymphadenopathy Plan -Interventional radiology was called to [facility] to schedule the patient emergently for an ultrasound-guided biopsy of one of her supraclavicular lymph nodes .  -She will need a core biopsy for adequate sample for an adequate lymphoma workup . -Urgent PET/CT scan for staging and pretreatment scan . -Echo to assess cardiac function and ejection fraction . -Hepatitis profile and HIV testing was done which was negative. -We'll place the patient on acyclovir prophylaxis in the setting of an  immunocompromised state and likely need for chemo-immunotherapy. - uric acid level suggests no overt tumor lysis - she will need to be started on allopurinol if lymphoma is confirmed for tumor lysis syndrome prophylaxis.   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 -In the setting of asthma with her primary care physician consider transitioning to a calcium channel blocker if appropriate.  #4 history of sleep apnea has been noncompliant with her CPAP use. She does have a CPAP machine after counseling is agreeable to use this. Plan -She was strongly recommended to use her CPAP daily whenever she sleeps since this might have a beneficial effect on her PVCs, hypertension and is important to do while on sedative medications which could make her sleep apnea worse.  #5 severe anxiety. -Has been using Xanax when necessary.  -We discussed the use of an anti-depressant/antianxiety medication since she might continue to have generalized anxiety from her underlying medical issue for a longer time. -She was started on Cymbalta 30 mg by mouth daily.  #6 persistent dry cough likely due to airway irritation from mediastinal adenopathy. Plan -Given prescription for [facility] when necessary.   Labs today US guide biopsy ASAP/very urgent PET/CT in 4-5 days ECHO in 5-7 days RTC with Dr Irene Limbo in 2-3 days after biopsy   Orders Placed This Encounter  Procedures  . US Biopsy    Standing Status:   Future    Standing Expiration Date:   12/17/2017    Order Specific Question:   Lab orders requested (DO NOT place separate lab orders, these will be automatically ordered during procedure specimen collection):    Answer:   Surgical Pathology    Comments:   Flow cytometry , molecular stiudies as indicated    Order Specific Question:   Reason for Exam (SYMPTOM  OR DIAGNOSIS REQUIRED)    Answer:   Core needle biopsy of supraclavicular LN stat. (Concern for aggressive lymphoma with impending  thoracic inlet obstruction). Newly large b/l supraclavicular and mediastinal and upper abd lymphadenopathy - rapidly increasing  Order Specific Question:   Preferred imaging location?    Answer:   [facility] Initial (PI) Skull Base To Thigh    Standing Status:   Future    Standing Expiration Date:   11/20/2017    Order Specific Question:   Reason for exam:    Answer:   Initial staging for newly diagnosed lymphoma    Order Specific Question:   Preferred imaging location?    Answer:   [facility]  . CBC & Diff and Retic    Standing Status:   Future    Number of Occurrences:   1    Standing Expiration Date:   10/16/2017  . Comprehensive metabolic panel    Standing Status:   Future    Number of Occurrences:   1    Standing Expiration Date:   10/16/2017  . Uric acid    Standing Status:   Future    Number of Occurrences:   1    Standing Expiration Date:   10/16/2017  . Phosphorus    Standing Status:   Future    Number of Occurrences:   1    Standing Expiration Date:   10/16/2017  . Lactate dehydrogenase    Standing Status:   Future    Number of Occurrences:   1    Standing Expiration Date:   10/16/2017  . Hepatitis C antibody    Standing Status:   Future    Number of Occurrences:   1    Standing Expiration Date:   10/16/2017  . Hepatitis B surface antigen    Standing Status:   Future    Number of Occurrences:   1    Standing Expiration Date:   10/16/2017  . Hepatitis B core antibody, total    Standing Status:   Future    Number of Occurrences:   1    Standing Expiration Date:   10/16/2017  . HIV antibody (with reflex)    Standing Status:   Future    Number of Occurrences:   1    Standing Expiration Date:   10/16/2017  . ECHOCARDIOGRAM COMPLETE    Standing Status:   Future    Standing Expiration Date:   01/15/2018    Order Specific Question:   Where should this test be performed    Answer:   [facility]    Order Specific Question:   Complete or Limited  study?    Answer:   Complete    Order Specific Question:   With Image Enhancing Agent or without Image Enhancing Agent?    Answer:   With Image Enhancing Agent    Order Specific Question:   Reason for exam-Echo    Answer:   Chemo  V67.2 / Z09     All of the patients questions were answered with apparent satisfaction. The patient knows to call the clinic with any problems, questions or concerns.  I spent 60 minutes counseling the patient face to face. The total time spent in the appointment was 80 minutes and more than 50% was on counseling and direct patient cares.    Sullivan Lone MD [place] AAHIVMS [facility] Hematology/Oncology Physician [facility]  (Office):       [phone] (Work cell):  [phone] (Fax):           [phone]  10/16/2016 10:38 AM

## 2016-10-16 NOTE — Patient Instructions (Signed)
-  keep well hydrated atleast 48-64 oz of water daily -Please maintain compliance with daily CPAP use

## 2016-10-17 LAB — HIV ANTIBODY (ROUTINE TESTING W REFLEX): HIV Screen 4th Generation wRfx: NONREACTIVE

## 2016-10-17 LAB — HEPATITIS C ANTIBODY: Hep C Virus Ab: <0.1 s/co ratio (ref 0.0–0.9)

## 2016-10-17 LAB — HEPATITIS B CORE ANTIBODY, TOTAL: Hep B Core Ab, Tot: NEGATIVE

## 2016-10-17 LAB — HEPATITIS B SURFACE ANTIGEN: HEP B S AG: NEGATIVE

## 2016-10-17 LAB — PHOSPHORUS: Phosphorus, Ser: 3.6 mg/dL (ref 2.5–4.5)

## 2016-10-19 ENCOUNTER — Telehealth: Payer: Self-pay

## 2016-10-19 NOTE — Telephone Encounter (Signed)
Pt states that she is seeing oncology Irene Limbo this past Friday. She is liked this doctor. Biopsy is scheduled for Wednesday morning. She just wanted to update [name]. Pt states that she has diarrhea and vomiting 1 day this weekend, but this has subsided. Denise [name]

## 2016-10-20 ENCOUNTER — Other Ambulatory Visit: Payer: Self-pay | Admitting: Radiology

## 2016-10-20 ENCOUNTER — Telehealth: Payer: Self-pay | Admitting: Family Medicine

## 2016-10-20 MED ORDER — ONDANSETRON 8 MG PO TBDP: 8.0000 mg | ORAL_TABLET | Freq: Three times a day (TID) | ORAL | 0 refills | Status: DC | PRN

## 2016-10-20 NOTE — Telephone Encounter (Signed)
Pt has vomiting and diarrhea that started on Saturday. Imodium seem to calm diarrhea. She has also tried [employer], Nexium & crushed ice. Pt can not keep anything down now. Requesting med for nausea, vomiting,diarrhea.

## 2016-10-21 ENCOUNTER — Ambulatory Visit (HOSPITAL_COMMUNITY)
Admission: RE | Admit: 2016-10-21 | Discharge: 2016-10-21 | Disposition: A | Discharge status: Home / Self Care | Payer: BC Managed Care – PPO | Source: Ambulatory Visit | Attending: Hematology | Admitting: Hematology

## 2016-10-21 ENCOUNTER — Encounter (HOSPITAL_COMMUNITY): Payer: Self-pay

## 2016-10-21 DIAGNOSIS — C8598 Non-Hodgkin lymphoma, unspecified, lymph nodes of multiple sites: Secondary | ICD-10-CM | POA: Diagnosis present

## 2016-10-21 DIAGNOSIS — Z90710 Acquired absence of both cervix and uterus: Secondary | ICD-10-CM | POA: Insufficient documentation

## 2016-10-21 DIAGNOSIS — Z885 Allergy status to narcotic agent status: Secondary | ICD-10-CM | POA: Insufficient documentation

## 2016-10-21 DIAGNOSIS — Z820 Family history of epilepsy and other diseases of the nervous system: Secondary | ICD-10-CM | POA: Diagnosis not present

## 2016-10-21 DIAGNOSIS — G4730 Sleep apnea, unspecified: Secondary | ICD-10-CM | POA: Diagnosis not present

## 2016-10-21 DIAGNOSIS — Z800 Family history of malignant neoplasm of digestive organs: Secondary | ICD-10-CM | POA: Diagnosis not present

## 2016-10-21 DIAGNOSIS — M71552 Other bursitis, not elsewhere classified, left hip: Secondary | ICD-10-CM | POA: Insufficient documentation

## 2016-10-21 DIAGNOSIS — K219 Gastro-esophageal reflux disease without esophagitis: Secondary | ICD-10-CM | POA: Insufficient documentation

## 2016-10-21 DIAGNOSIS — I493 Ventricular premature depolarization: Secondary | ICD-10-CM | POA: Diagnosis not present

## 2016-10-21 DIAGNOSIS — C866 Primary cutaneous CD30-positive T-cell proliferations: Secondary | ICD-10-CM | POA: Diagnosis not present

## 2016-10-21 DIAGNOSIS — Z8249 Family history of ischemic heart disease and other diseases of the circulatory system: Secondary | ICD-10-CM | POA: Diagnosis not present

## 2016-10-21 LAB — PROTIME-INR
INR: 1.08
PROTHROMBIN TIME: 14 s (ref 11.4–15.2)

## 2016-10-21 LAB — CBC WITH DIFFERENTIAL/PLATELET
BASOS PCT: 0 %
Basophils Absolute: 0 10*3/uL (ref 0.0–0.1)
EOS PCT: 0 %
Eosinophils Absolute: 0 10*3/uL (ref 0.0–0.7)
HEMATOCRIT: 37.5 % (ref 36.0–46.0)
HEMOGLOBIN: 12.9 g/dL (ref 12.0–15.0)
LYMPHS PCT: 14 %
Lymphs Abs: 0.7 10*3/uL (ref 0.7–4.0)
MCH: 24.7 pg — AB (ref 26.0–34.0)
MCHC: 34.4 g/dL (ref 30.0–36.0)
MCV: 71.8 fL — ABNORMAL LOW (ref 78.0–100.0)
Monocytes Absolute: 0.6 10*3/uL (ref 0.1–1.0)
Monocytes Relative: 12 %
NEUTROS ABS: 4 10*3/uL (ref 1.7–7.7)
NEUTROS PCT: 74 %
Platelets: 201 10*3/uL (ref 150–400)
RBC: 5.22 MIL/uL — ABNORMAL HIGH (ref 3.87–5.11)
RDW: 14.4 % (ref 11.5–15.5)
WBC: 5.3 10*3/uL (ref 4.0–10.5)

## 2016-10-21 MED ORDER — FENTANYL CITRATE (PF) 100 MCG/2ML IJ SOLN
INTRAMUSCULAR | Status: AC | PRN
  Administered 2016-10-21: 25 ug via INTRAVENOUS
  Administered 2016-10-21: 50 ug via INTRAVENOUS
  Administered 2016-10-21: 25 ug via INTRAVENOUS

## 2016-10-21 MED ORDER — MIDAZOLAM HCL 2 MG/2ML IJ SOLN
INTRAMUSCULAR | Status: AC
  Filled 2016-10-21: qty 6

## 2016-10-21 MED ORDER — SODIUM CHLORIDE 0.9 % IV SOLN
INTRAVENOUS | Status: DC
  Administered 2016-10-21: 10:00:00 via INTRAVENOUS

## 2016-10-21 MED ORDER — MIDAZOLAM HCL 2 MG/2ML IJ SOLN
INTRAMUSCULAR | Status: AC | PRN
  Administered 2016-10-21 (×3): 1 mg via INTRAVENOUS

## 2016-10-21 MED ORDER — FENTANYL CITRATE (PF) 100 MCG/2ML IJ SOLN
INTRAMUSCULAR | Status: AC
  Filled 2016-10-21: qty 4

## 2016-10-21 NOTE — Sedation Documentation (Signed)
Patient is resting comfortably. 

## 2016-10-21 NOTE — Procedures (Signed)
Interventional Radiology Procedure Note  Procedure: US guided left supraclavicular LN biopsy  Complications: None  Estimated Blood Loss: < 10 mL  Findings:  16 G core biopsy x 5 of enlarged left supraclavicular lymph nodes.  Venetia Night. Kathlene Cote, M.D Pager:  [phone]

## 2016-10-21 NOTE — Discharge Instructions (Addendum)
Needle Biopsy, Care After °These instructions give you information about caring for yourself after your procedure. Your doctor may also give you more specific instructions. Call your doctor if you have any problems or questions after your procedure. °Follow these instructions at home: °· Rest as told by your doctor. °· Take medicines only as told by your doctor. °· There are many different ways to close and cover the biopsy site, including stitches (sutures), skin glue, and adhesive strips. Follow instructions from your doctor about: °¨ How to take care of your biopsy site. °¨ When and how you should change your bandage (dressing). °¨ When you should remove your dressing. °¨ Removing whatever was used to close your biopsy site. °· Check your biopsy site every day for signs of infection. Watch for: °¨ Redness, swelling, or pain. °¨ Fluid, blood, or pus. °Contact a doctor if: °· You have a fever. °· You have redness, swelling, or pain at the biopsy site, and it lasts longer than a few days. °· You have fluid, blood, or pus coming from the biopsy site. °· You feel sick to your stomach (nauseous). °· You throw up (vomit). °Get help right away if: °· You are short of breath. °· You have trouble breathing. °· Your chest hurts. °· You feel dizzy or you pass out (faint). °· You have bleeding that does not stop with pressure or a bandage. °· You cough up blood. °· Your belly (abdomen) hurts. °This information is not intended to replace advice given to you by your health care provider. Make sure you discuss any questions you have with your health care provider. °Document Released: 07/09/2008 Document Revised: 01/02/2016 Document Reviewed: 07/23/2014 °Elsevier Interactive Patient Education © 2017 [employer] ° ° °Moderate Conscious Sedation, Adult, Care After °These instructions provide you with information about caring for yourself after your procedure. Your health care provider may also give you more specific instructions.  Your treatment has been planned according to current medical practices, but problems sometimes occur. Call your health care provider if you have any problems or questions after your procedure. °What can I expect after the procedure? °After your procedure, it is common: °· To feel sleepy for several hours. °· To feel clumsy and have poor balance for several hours. °· To have poor judgment for several hours. °· To vomit if you eat too soon. °Follow these instructions at home: °For at least 24 hours after the procedure:  ° °· Do not: °¨ Participate in activities where you could fall or become injured. °¨ Drive. °¨ Use heavy machinery. °¨ Drink alcohol. °¨ Take sleeping pills or medicines that cause drowsiness. °¨ Make important decisions or sign legal documents. °¨ Take care of children on your own. °· Rest. °Eating and drinking  °· Follow the diet recommended by your health care provider. °· If you vomit: °¨ Drink water, juice, or soup when you can drink without vomiting. °¨ Make sure you have little or no nausea before eating solid foods. °General instructions  °· Have a responsible adult stay with you until you are awake and alert. °· Take over-the-counter and prescription medicines only as told by your health care provider. °· If you smoke, do not smoke without supervision. °· Keep all follow-up visits as told by your health care provider. This is important. °Contact a health care provider if: °· You keep feeling nauseous or you keep vomiting. °· You feel light-headed. °· You develop a rash. °· You have a fever. °Get help right   away if: °· You have trouble breathing. °This information is not intended to replace advice given to you by your health care provider. Make sure you discuss any questions you have with your health care provider. °Document Released: 05/17/2013 Document Revised: 12/30/2015 Document Reviewed: 11/16/2015 °Elsevier Interactive Patient Education © 2017 [employer] ° °

## 2016-10-21 NOTE — H&P (Signed)
Chief Complaint: lymphadenopathy  Referring Physician:Dr. Sullivan Lone  Supervising Physician: Aletta Edouard  Patient Status: [facility] - Out-pt  HPI: Denise [name] is an 61 y.o. female who recently began feeling poorly and was diagnosed with shingles by her PCP.  She was also having night sweats and some chills.  She has had occasional headaches as well.  She has a history of asthma, but very rarely requires her rescue inhaler.  She has had more shortness of breath recently and is having to use her inhaler quite frequently for relief.  She was found to have enlarged lymph nodes in her neck and was treated for her shingles, but also had a chest CT ordered.  This revealed significant supraclavicular lymphadenopathy.  She was referred to Dr. Irene Limbo who has requested a lymph node biopsy to rule out lymphoma.  Just recently she has started having significant nausea as well as diarrhea.  Past Medical History:  Past Medical History:  Diagnosis Date  . Allergy   . Asthma   . Bursitis of left hip   . Fibroids    TAH in 1999  . GERD (gastroesophageal reflux disease)   . PVC's (premature ventricular contractions)   . Sleep apnea     Past Surgical History:  Past Surgical History:  Procedure Laterality Date  . CARDIAC CATH  05/2007   J. [facility]  . HAMMER TOE SURGERY Bilateral 2016  . TOTAL ABDOMINAL HYSTERECTOMY  05/15/1998   secondary to fibroids  . WISDOM TOOTH EXTRACTION  mid teens    Family History:  Family History  Problem Relation Age of Onset  . Cancer Maternal Grandmother     GI cancer in 88's  . Dementia Mother   . Heart disease Father   . Heart failure Father     Social History:  reports that she has never smoked. She has never used smokeless tobacco. She reports that she drinks about 0.6 - 1.2 oz of alcohol per week . She reports that she does not use drugs.  Allergies:  Allergies  Allergen Reactions  . Morphine And Related     Medications: Medications reviewed in  epic  Please HPI for pertinent positives, otherwise complete 10 system ROS negative.  Mallampati Score: MD Evaluation Airway: WNL Heart: WNL Abdomen: WNL Chest/ Lungs: Other (comments) Chest/ lungs comments: bulky adenopathy noted in supraclavicular regions bilaterally ASA  Classification: 2 Mallampati/Airway Score: Two  Physical Exam: Temp (F)   99.9  99.9 (37.7)  03/14 0914  Pulse Rate   82  82  03/14 0914  Resp   16  16  03/14 0914  BP   153/75  153/75  03/14 0914  SpO2 (%)   98  98  03/14 0914  Weight (lb)   184  184 lb 6 oz (83.6 kg)  03/   General: pleasant, WD, WN, but obese white female who is laying in bed in NAD HEENT: head is normocephalic, atraumatic.  Sclera are noninjected.  PERRL.  Ears and nose without any masses or lesions.  Mouth is pink and moist Heart: regular, rate, and rhythm.  Normal s1,s2. No obvious murmurs, gallops, or rubs noted.  Palpable radial pulses bilaterally Lungs: CTAB, no wheezes, rhonchi, or rales noted.  Respiratory effort nonlabored Abd: soft, NT, ND, +BS, no masses, hernias, or organomegaly Lymph: enlarged supraclavicular noted that are visible as well as palpable. Psych: A&Ox3 with an appropriate affect.   Labs: Results for orders placed or performed during the hospital encounter  of 10/21/16 (from the past 48 hour(s))  Protime-INR     Status: None   Collection Time: 10/21/16  9:23 AM  Result Value Ref Range   Prothrombin Time 14.0 11.4 - 15.2 seconds   INR 1.08   CBC with Differential/Platelet     Status: Abnormal (Preliminary result)   Collection Time: 10/21/16  9:23 AM  Result Value Ref Range   WBC 5.3 4.0 - 10.5 K/uL   RBC 5.22 (H) 3.87 - 5.11 MIL/uL   Hemoglobin 12.9 12.0 - 15.0 g/dL   HCT 37.5 36.0 - 46.0 %   MCV 71.8 (L) 78.0 - 100.0 fL   MCH 24.7 (L) 26.0 - 34.0 pg   MCHC 34.4 30.0 - 36.0 g/dL   RDW 14.4 11.5 - 15.5 %   Platelets 201 150 - 400 K/uL   Neutrophils Relative % PENDING %   Neutro Abs PENDING 1.7 -  7.7 K/uL   Band Neutrophils PENDING %   Lymphocytes Relative PENDING %   Lymphs Abs PENDING 0.7 - 4.0 K/uL   Monocytes Relative PENDING %   Monocytes Absolute PENDING 0.1 - 1.0 K/uL   Eosinophils Relative PENDING %   Eosinophils Absolute PENDING 0.0 - 0.7 K/uL   Basophils Relative PENDING %   Basophils Absolute PENDING 0.0 - 0.1 K/uL   WBC Morphology PENDING    RBC Morphology PENDING    Smear Review PENDING    nRBC PENDING 0 /100 WBC   Metamyelocytes Relative PENDING %   Myelocytes PENDING %   Promyelocytes Absolute PENDING %   Blasts PENDING %    Imaging: No results found.  Assessment/Plan 1. Supraclavicular lymphadenopathy -we will plan to proceed today with a [place] [address] BX. -labs and vitals have been reviewed -Risks and Benefits discussed with the patient including, but not limited to bleeding, infection, damage to adjacent structures or low yield requiring additional tests. All of the patient's questions were answered, patient is agreeable to proceed. Consent signed and in chart.  Thank you for this interesting consult.  I greatly enjoyed meeting LYRIQ FINERTY and look forward to participating in their care.  A copy of this report was sent to the requesting provider on this date.  Electronically Signed: Henreitta Cea 10/21/2016, 10:43 AM   I spent a total of  30 Minutes   in face to face in clinical consultation, greater than 50% of which was counseling/coordinating care for supraclavicular lymphadenopathy

## 2016-10-21 NOTE — Progress Notes (Deleted)
Patient called out for assistance to go to bathroom.  RN advised patient she was still on bedrest until 13:15 due to liver biopsy and risk of bleeding.  Patient refused bedpan and bedside commode due to having her period.  Patient advised again risks of bleeding and MD orders for bedrest for 3 hours after liver biopsy.  Patient verbalized understanding and insisted on getting out of bed to void in the bathroom.  Additional RN support requested, patient ambulated to bathroom and back to bed.  Patient visibly weak and unsteady ambulation noted.

## 2016-10-22 ENCOUNTER — Encounter: Payer: Self-pay | Admitting: Hematology

## 2016-10-22 ENCOUNTER — Ambulatory Visit (HOSPITAL_BASED_OUTPATIENT_CLINIC_OR_DEPARTMENT_OTHER): Payer: BC Managed Care – PPO | Admitting: Hematology

## 2016-10-22 VITALS — BP 147/98 | HR 90 | Temp 99.7°F | Resp 16 | Wt 190.2 lb

## 2016-10-22 DIAGNOSIS — R05 Cough: Secondary | ICD-10-CM

## 2016-10-22 DIAGNOSIS — F419 Anxiety disorder, unspecified: Secondary | ICD-10-CM | POA: Diagnosis not present

## 2016-10-22 DIAGNOSIS — R599 Enlarged lymph nodes, unspecified: Secondary | ICD-10-CM

## 2016-10-22 DIAGNOSIS — C8598 Non-Hodgkin lymphoma, unspecified, lymph nodes of multiple sites: Secondary | ICD-10-CM

## 2016-10-24 ENCOUNTER — Other Ambulatory Visit: Payer: Self-pay | Admitting: Hematology

## 2016-10-26 ENCOUNTER — Other Ambulatory Visit: Payer: Self-pay | Admitting: Hematology

## 2016-10-26 ENCOUNTER — Other Ambulatory Visit: Payer: Self-pay | Admitting: *Deleted

## 2016-10-26 ENCOUNTER — Telehealth: Payer: Self-pay | Admitting: Family Medicine

## 2016-10-26 MED ORDER — DEXAMETHASONE 4 MG PO TABS: 4.0000 mg | ORAL_TABLET | Freq: Every day | ORAL | 0 refills | Status: DC

## 2016-10-26 MED ORDER — BENZONATATE 100 MG PO CAPS: 100.0000 mg | ORAL_CAPSULE | Freq: Three times a day (TID) | ORAL | 0 refills | Status: DC | PRN

## 2016-10-26 NOTE — Telephone Encounter (Signed)
[name] [phone] had sent me an email on Friday.  I was off that day.  Pt was to receive results from oncologist by phone and didn't want that to happen.  She wanted Dr. Redmond School to obtain the results and sit down with them to give to her.   I called [name] here in the office to make  Dr. Redmond School aware.  Dr.Lalonde was going to call patient and advise her that he was not the correct person to give her that info.  As he would not be able to answer a lot of the questions she was going to have.

## 2016-10-27 ENCOUNTER — Encounter (HOSPITAL_COMMUNITY)
Admission: RE | Admit: 2016-10-27 | Discharge: 2016-10-27 | Disposition: A | Discharge status: Home / Self Care | Payer: BC Managed Care – PPO | Source: Ambulatory Visit | Attending: Hematology | Admitting: Hematology

## 2016-10-27 DIAGNOSIS — C8598 Non-Hodgkin lymphoma, unspecified, lymph nodes of multiple sites: Secondary | ICD-10-CM | POA: Insufficient documentation

## 2016-10-27 LAB — GLUCOSE, CAPILLARY: Glucose-Capillary: 137 mg/dL — ABNORMAL HIGH (ref 65–99)

## 2016-10-27 MED ORDER — FLUDEOXYGLUCOSE F - 18 (FDG) INJECTION
9.0600 | Freq: Once | INTRAVENOUS | Status: AC | PRN
  Administered 2016-10-27: 9.06 via INTRAVENOUS

## 2016-10-27 NOTE — Progress Notes (Signed)
Marland Kitchen    HEMATOLOGY/ONCOLOGY CLINIC NOTE  Date of Service: .10/22/2016  Patient Care Team: Denita Lung, MD as PCP - General (Family Medicine)  CHIEF COMPLAINTS/PURPOSE OF CONSULTATION:   lymphoma  HISTORY OF PRESENTING ILLNESS:   Denise [name] is a wonderful 61 y.o. female who has been referred to [name] by Dr .Wyatt Haste, MD  for evaluation and management of new concern for lymphoma.  Patient is a relatively healthy 61 year old female with a history of asthma, seasonal allergies, sleep apnea with questionable compliance with CPAP machine Who has been referred to [name] by Dr. Redmond School as an urgent consultation for concern of rapidly progressing lymphoma.  Patient had presented with shingles on her right posterior chest wall on 09/22/2016 along with some symptoms of chills night sweats decreased appetite and some shortness of breath and some enlarged lymph nodes in the neck.  She was treated with Valtrex with resolution of her painful right chest wall skin rash which has now crusted over but still remains somewhat uncomfortable.  She notes that she had continued difficulty with fatigue increasing shortness of breath and some sensations of nausea. She notes no objective fevers but has had chills and drenching night sweats for the last 23 weeks. She also has noted increasing swelling of her neck lymph nodes and a persistent cough that has been keeping her up at night.  She reports weight loss of about 7-8 pounds over the last 3 weeks and significant fatigue.   She is here with her female partner for further evaluation and management and is extremely anxious as is understandable .  She has had a CT of the chest with her primary care physician on 10/15/2016 which showed Bulky lymphadenopathy identified in the supraclavicular regions bilaterally, mediastinum, both hila, and upper abdomen. Imaging features highly suspicious for lymphoma. Metastatic disease considered less  likely.  Patient does not have any overt inspiratory stridor or wheezing at this time or overt respiratory distress .  INTERVAL HISTORY  Denise [name] is here with her partner for f/u of her LN biopsy results. Prelim results were not available today but patient was called on Friday and then again on Monday to inform her about the fact that her prelim pathology results as discussed with Dr Tresa Moore is suggestive of a T cell lymphoma. Patient notes cough better. Breathing stable. No CP. No facial or UE swelling.  MEDICAL HISTORY:  Past Medical History:  Diagnosis Date  . Allergy   . Asthma   . Bursitis of left hip   . Fibroids    TAH in 1999  . GERD (gastroesophageal reflux disease)   . PVC's (premature ventricular contractions)   . Sleep apnea     SURGICAL HISTORY: Past Surgical History:  Procedure Laterality Date  . CARDIAC CATH  05/2007   [facility]  . HAMMER TOE SURGERY Bilateral 2016  . TOTAL ABDOMINAL HYSTERECTOMY  05/15/1998   secondary to fibroids  . WISDOM TOOTH EXTRACTION  mid teens    SOCIAL HISTORY: Social History   Social History  . Marital status: Significant Other    Spouse name: N/A  . Number of children: 0  . Years of education: N/A   Occupational History  .  [employer] History Main Topics  . Smoking status: Never Smoker  . Smokeless tobacco: Never Used  . Alcohol use 0.6 - 1.2 oz/week    1 - 2 Standard drinks or equivalent per week     Comment: only  if out to dinner, maybe once a month  . Drug use: No  . Sexual activity: Yes    Partners: Female    Birth control/ protection: None   Other Topics Concern  . Not on file   Social History Narrative  . No narrative on file    FAMILY HISTORY: Family History  Problem Relation Age of Onset  . Cancer Maternal Grandmother     GI cancer in 47's  . Dementia Mother   . Heart disease Father   . Heart failure Father     ALLERGIES:  is allergic to morphine and related.  MEDICATIONS:   Current Outpatient Prescriptions  Medication Sig Dispense Refill  . acyclovir (ZOVIRAX) 400 MG tablet Take 1 tablet (400 mg total) by mouth 2 (two) times daily. 60 tablet 2  . ADVAIR DISKUS 100-50 MCG/DOSE AEPB Inhale 1 puff into the lungs daily.     Marland Kitchen albuterol (PROVENTIL HFA;VENTOLIN HFA) 108 (90 Base) MCG/ACT inhaler Inhale 2 puffs into the lungs every 6 (six) hours as needed for wheezing or shortness of breath. 1 Inhaler 0  . ALPRAZolam (XANAX) 0.25 MG tablet TAKE 1 TABLET BY MOUTH TWICE A DAY AS NEEDED FOR ANXIETY 20 tablet 0  . aspirin 81 MG chewable tablet Chew 81 mg by mouth daily.    Marland Kitchen atenolol (TENORMIN) 50 MG tablet Take 1 tablet by mouth 2 (two) times daily.  0  . DULoxetine (CYMBALTA) 30 MG capsule Take 1 capsule (30 mg total) by mouth daily. 30 capsule 3  . esomeprazole (NEXIUM) 40 MG capsule Take 1 capsule (40 mg total) by mouth daily before breakfast. 100 capsule 0  . fluticasone (FLONASE) 50 MCG/ACT nasal spray Place 2 sprays into both nostrils daily. 16 g 11  . hydrochlorothiazide (MICROZIDE) 12.5 MG capsule TAKE ONE CAPSULE BY MOUTH EVERY DAY 30 capsule 11  . meloxicam (MOBIC) 15 MG tablet TAKE 1 TABLET BY MOUTH EVERY DAY 30 tablet 2  . ondansetron (ZOFRAN ODT) 8 MG disintegrating tablet Take 1 tablet (8 mg total) by mouth every 8 (eight) hours as needed for nausea or vomiting. 20 tablet 0  . triazolam (HALCION) 0.125 MG tablet Take 0.125 mg by mouth at bedtime.    . triazolam (HALCION) 0.25 MG tablet TAKE 1 TABLET BY MOUTH AT BEDTIME AS NEEDED FOR SLEEP (MAX 30/75 DAYS PER INS) 30 tablet 0  . benzonatate (TESSALON) 100 MG capsule Take 1 capsule (100 mg total) by mouth 3 (three) times daily as needed for cough. 30 capsule 0  . dexamethasone (DECADRON) 4 MG tablet Take 1 tablet (4 mg total) by mouth daily with lunch. 30 tablet 0   No current facility-administered medications for this visit.     REVIEW OF SYSTEMS:    10 Point review of Systems was done is negative except as  noted above.  PHYSICAL EXAMINATION: ECOG PERFORMANCE STATUS: 1 - Symptomatic but completely ambulatory  . Vitals:   10/22/16 1635  BP: (!) 147/98  Pulse: 90  Resp: 16  Temp: 99.7 F (37.6 C)   Filed Weights   10/22/16 1635  Weight: 190 lb 3.2 oz (86.3 kg)   .Body mass index is 30.7 kg/m.  GENERAL:alert, in no acute distress and comfortable SKIN: skin color, texture, turgor are normal, no rashes or significant lesions EYES: normal, conjunctiva are pink and non-injected, sclera clear OROPHARYNX:no exudate, no erythema and lips, buccal mucosa, and tongue normal  NECK: supple, no JVD,No inspiratory stridor, no facial swelling or upper extremity swelling.  LYMPH: Extensive bilateral supraclavicular lymphadenopathy, no palpable inguinal or axillary lymphadenopathy noted. LUNGS: clear to auscultation with normal respiratory effort HEART: regular rate & rhythm,  no murmurs and no lower extremity edema ABDOMEN: abdomen soft, non-tender, normoactive bowel sounds  Musculoskeletal: no cyanosis of digits and no clubbing  PSYCH: alert & oriented x 3 with fluent speech NEURO: no focal motor/sensory deficits  LABORATORY DATA:  I have reviewed the data as listed  . CBC Latest Ref Rng & Units 10/21/2016 10/16/2016 10/15/2016  WBC 4.0 - 10.5 K/uL 5.3 4.6 4.0  Hemoglobin 12.0 - 15.0 g/dL 12.9 12.5 12.0  Hematocrit 36.0 - 46.0 % 37.5 36.8 36.8  Platelets 150 - 400 K/uL 201 174 190    CMP Latest Ref Rng & Units 10/16/2016 10/15/2016 09/14/2016  Glucose 70 - 140 mg/dl 109 135(H) 105(H)  BUN 7.0 - 26.0 mg/dL 8.3 9 11   Creatinine 0.6 - 1.1 mg/dL 0.7 0.65 0.68  Sodium 136 - 145 mEq/L 133(L) 132(L) 138  Potassium 3.5 - 5.1 mEq/L 3.4(L) 3.3(L) 4.4  Chloride 98 - 110 mmol/L - 96(L) 101  CO2 22 - 29 mEq/L 26 25 27   Calcium 8.4 - 10.4 mg/dL 9.3 8.4(L) 9.1  Total Protein 6.4 - 8.3 g/dL 7.1 6.2 7.0  Total Bilirubin 0.20 - 1.20 mg/dL 0.46 0.4 0.5  Alkaline Phos 40 - 150 U/L 82 63 80  AST 5 - 34 U/L 21 16  19   ALT 0 - 55 U/L 12 8 14    . Lab Results  Component Value Date   LDH 247 (H) 10/16/2016   Component     Latest Ref Rng & Units 10/16/2016  Uric Acid, Serum     2.6 - 7.4 mg/dl 6.4  Phosphorus     2.5 - 4.5 mg/dL 3.6  LDH     125 - 245 U/L 247 (H)  Hep C Virus Ab     0.0 - 0.9 s/co ratio <0.1  Hepatitis B Surface Ag     Negative Negative  Hep B Core Ab, Tot     Negative Negative  HIV     Non Reactive Non Reactive    RADIOGRAPHIC STUDIES: I have personally reviewed the radiological images as listed and agreed with the findings in the report. Dg Chest 2 View  Result Date: 10/15/2016 CLINICAL DATA:  Shortness of breath, cough for 3 weeks EXAM: CHEST  2 VIEW COMPARISON:  None. FINDINGS: Cardiomediastinal silhouette is unremarkable. Mild mid thoracic dextroscoliosis. Mild lower thoracic levoscoliosis. There are degenerative changes thoracic spine. No pulmonary edema. There is streaky infiltrate in right upper lobe perihilar. This is highly suspicious for pneumonia. No convincing pulmonary edema. IMPRESSION: Degenerative changes and mild scoliosis thoracic spine. Streaky infiltrate right upper lobe perihilar highly suspicious for pneumonia. Followup PA and lateral chest X-ray is recommended in 3-4 weeks following trial of antibiotic therapy to ensure resolution and exclude underlying malignancy. Electronically Signed   By: Lahoma Crocker M.D.   On: 10/15/2016 10:35   Ct Chest W Contrast  Result Date: 10/15/2016 CLINICAL DATA:  Right parahilar lesions seen on chest x-ray EXAM: CT CHEST WITH CONTRAST TECHNIQUE: Multidetector CT imaging of the chest was performed during intravenous contrast administration. CONTRAST:  69mL ISOVUE-300 IOPAMIDOL (ISOVUE-300) INJECTION 61% COMPARISON:  Chest x-ray 10/15/2016 FINDINGS: Cardiovascular: The heart size is normal. No pericardial effusion. Coronary artery calcification is noted. Atherosclerotic calcification is noted in the wall of the thoracic aorta.  Mediastinum/Nodes: Bulky supraclavicular lymphadenopathy is evident. Left supraclavicular nodal conglomeration measures  3.0 x 4.2 cm. Right supraclavicular nodal conglomeration is 3.4 x 3.2 cm. 4.2 x 4.0 cm nodal conglomeration in the superior mediastinum generates mass-effect on the right internal jugular vein at the level of the thoracic inlet. Subcarinal lymph node measures 2.5 cm short axis. Bulky lymphadenopathy is seen in both hilar regions. Lungs/Pleura: Right parahilar nodularity is seen along the inferior right hilum and tracking along the major fissure. 8 mm right middle lobe pulmonary nodule is associated. Left lung clear. Small right pleural effusion noted. Upper Abdomen: Bulky retrocrural, gastrohepatic ligament and hepatoduodenal ligament lymphadenopathy is identified. 2.5 cm short axis retrocaval lymph node is identified in the right retroperitoneum. There is a dominant 3.0 cm short axis in the hepatoduodenal ligament. Musculoskeletal: Bone windows reveal no worrisome lytic or sclerotic osseous lesions. IMPRESSION: 1. Bulky lymphadenopathy identified in the supraclavicular regions bilaterally, mediastinum, both hila, and upper abdomen. Imaging features highly suspicious for lymphoma. Metastatic disease considered less likely. 2. Parahilar abnormality on chest x-ray earlier today was due to nodularity in the lung parenchyma adjacent to the right hilum, mainly along the major fissure. 8 mm nodule identified central right middle lobe. 3. Small right pleural effusion. Electronically Signed   By: Misty Stanley M.D.   On: 10/15/2016 12:56   US Biopsy  Result Date: 10/21/2016 CLINICAL DATA:  Diffuse lymphadenopathy in the lower neck, chest and upper abdomen by prior CT. EXAM: ULTRASOUND GUIDED CORE BIOPSY OF LEFT SUPRACLAVICULAR LYMPH NODE MEDICATIONS: 3.0 mg IV Versed; 100 mcg IV Fentanyl Total Moderate Sedation Time: 13 minutes. The patient's level of consciousness and physiologic status were  continuously monitored during the procedure by Radiology nursing. PROCEDURE: The procedure, risks, benefits, and alternatives were explained to the patient. Questions regarding the procedure were encouraged and answered. The patient understands and consents to the procedure. A time out was performed prior to initiating the procedure. Ultrasound was performed of both sides of the neck. The left neck was prepped with chlorhexidine in a sterile fashion, and a sterile drape was applied covering the operative field. A sterile gown and sterile gloves were used for the procedure. Local anesthesia was provided with 1% Lidocaine. Under direct ultrasound guidance, a total of 5 separate 16 gauge core biopsy samples were obtained of 2 adjacent lymph nodes in the left supraclavicular region. Core biopsy samples were submitted in saline. COMPLICATIONS: None. FINDINGS: There are a multitude of enlarged lymph nodes in both sides of the neck. The largest cluster of enlarged nodes was in the left supraclavicular region. Core biopsy samples yielded solid tissue. IMPRESSION: Ultrasound-guided core biopsy performed of left supraclavicular lymphadenopathy. Electronically Signed   By: Aletta Edouard M.D.   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 Imaging features highly suspicious for lymphoma. If this is confirmed to be a lymphoma on biopsy this would suggest at least stage III lymphoma. Fatigue anorexia or night sweats-drenching, and weight loss suggest constitutional type B symptoms.  #2 Impending thoracic outlet obstruction due to bulky lymphadenopathy  #3 Recent shingles infection on chest wall -resolved Plan -Urgent PET/CT scan for  staging and pretreatment scan .scheduled for 10/27/2016 -Echo to assess cardiac function and ejection fraction -pending -Hepatitis profile and HIV testing was done which was negative. -We'll place the patient on acyclovir prophylaxis  in the setting of an immunocompromised state and likely need for chemotherapy - uric acid level suggests no overt tumor lysis - she will need to be started on allopurinol for tumor lysis syndrome prophylaxis. -will need port placement and shall likely treat with CHOP or CHOEP with G-CSF support (final decision based on final pathology results   #3 history of PVCs -On chronic beta blocker therapy. -In the setting of asthma with her primary care physician consider transitioning to a calcium channel blocker if appropriate.  #4 history of sleep apnea has been noncompliant with her CPAP use. She does have a CPAP machine after counseling is agreeable to use this. Plan -She was strongly recommended to use her CPAP daily whenever she sleeps since this might have a beneficial effect on her PVCs, hypertension and is important to do while on sedative medications which could make her sleep apnea worse.  #5 severe anxiety - this appears somewhat improved -Has been using Xanax when necessary.  -continue on Cymbalta 30 mg by mouth daily.  #6 persistent dry cough likely due to airway irritation from mediastinal adenopathy. Plan -Given prescription for [facility] when necessary -improved.  RTC with Dr Irene Limbo in 11-12 days with labs and PET/CT PET/CT ASAP ECHO     All of the patients questions were answered with apparent satisfaction. The patient knows to call the clinic with any problems, questions or concerns.  I spent 20 minutes counseling the patient face to face. The total time spent in the appointment was 25 minutes and more than 50% was on counseling and direct patient cares.    Sullivan Lone MD [place] AAHIVMS [facility] Hematology/Oncology Physician [facility]  (Office):       [phone] (Work cell):  [phone] (Fax):           [phone]  10/27/2016 12:32 AM

## 2016-10-28 ENCOUNTER — Other Ambulatory Visit: Payer: Self-pay | Admitting: Hematology

## 2016-10-28 DIAGNOSIS — C8448 Peripheral T-cell lymphoma, not classified, lymph nodes of multiple sites: Secondary | ICD-10-CM

## 2016-10-28 NOTE — Progress Notes (Signed)
START ON PATHWAY REGIMEN - Lymphoma and CLL     A cycle is every 21 days:     Cyclophosphamide      Doxorubicin      Vincristine      Etoposide      Prednisone   **Always confirm dose/schedule in your pharmacy ordering system**    Patient Characteristics: T-Cell, First Line, First Line + Consolidation, Peripheral T-Cell Lymphoma, AITL, ALCL-ALK Negative Types Disease Type: Not Applicable Disease Type: T-Cell Line of therapy: First Line [place] Stage: IV T-Cell Subtype: Peripheral T-Cell, NOS  Intent of Therapy: Curative Intent, Discussed with Patient

## 2016-10-29 ENCOUNTER — Other Ambulatory Visit: Payer: Self-pay | Admitting: *Deleted

## 2016-10-29 ENCOUNTER — Telehealth: Payer: Self-pay | Admitting: Hematology

## 2016-10-29 DIAGNOSIS — C8448 Peripheral T-cell lymphoma, not classified, lymph nodes of multiple sites: Secondary | ICD-10-CM

## 2016-10-29 MED ORDER — ONDANSETRON HCL 8 MG PO TABS: 8.0000 mg | ORAL_TABLET | Freq: Two times a day (BID) | ORAL | 1 refills | Status: DC | PRN

## 2016-10-29 MED ORDER — PROCHLORPERAZINE MALEATE 10 MG PO TABS: 10.0000 mg | ORAL_TABLET | Freq: Four times a day (QID) | ORAL | 6 refills | Status: DC | PRN

## 2016-10-29 MED ORDER — LIDOCAINE-PRILOCAINE 2.5-2.5 % EX CREA: TOPICAL_CREAM | CUTANEOUS | 3 refills | Status: DC

## 2016-10-29 MED ORDER — ALLOPURINOL 300 MG PO TABS: 300.0000 mg | ORAL_TABLET | Freq: Every day | ORAL | 3 refills | Status: DC

## 2016-10-29 NOTE — Telephone Encounter (Signed)
Left message for patient re next appointment for 3/23. Appointments for 3/26 remain the same. Per desk nurse due to patient having echo 3/28 and port 3/29 start chemo the week of 4/2. Patient to get new schedule 3/23.

## 2016-10-29 NOTE — Telephone Encounter (Signed)
Patient returned call and confirmed appointments for 3/23, the week of 3/26 and the week of 4/2.

## 2016-10-30 ENCOUNTER — Other Ambulatory Visit: Payer: Self-pay | Admitting: Radiology

## 2016-10-30 ENCOUNTER — Other Ambulatory Visit: Payer: BC Managed Care – PPO

## 2016-11-02 ENCOUNTER — Other Ambulatory Visit (HOSPITAL_COMMUNITY)
Admission: RE | Admit: 2016-11-02 | Discharge: 2016-11-02 | Disposition: A | Discharge status: Home / Self Care | Payer: BC Managed Care – PPO | Source: Ambulatory Visit | Attending: Hematology | Admitting: Hematology

## 2016-11-02 ENCOUNTER — Ambulatory Visit (HOSPITAL_BASED_OUTPATIENT_CLINIC_OR_DEPARTMENT_OTHER): Payer: BC Managed Care – PPO | Admitting: Hematology

## 2016-11-02 ENCOUNTER — Other Ambulatory Visit (HOSPITAL_BASED_OUTPATIENT_CLINIC_OR_DEPARTMENT_OTHER): Payer: BC Managed Care – PPO

## 2016-11-02 VITALS — BP 164/72 | HR 69 | Temp 98.0°F | Resp 18 | Ht 66.0 in | Wt 182.1 lb

## 2016-11-02 DIAGNOSIS — C8448 Peripheral T-cell lymphoma, not classified, lymph nodes of multiple sites: Secondary | ICD-10-CM

## 2016-11-02 DIAGNOSIS — F419 Anxiety disorder, unspecified: Secondary | ICD-10-CM | POA: Diagnosis not present

## 2016-11-02 DIAGNOSIS — R059 Cough, unspecified: Secondary | ICD-10-CM

## 2016-11-02 DIAGNOSIS — R05 Cough: Secondary | ICD-10-CM | POA: Diagnosis not present

## 2016-11-02 LAB — CBC & DIFF AND RETIC
BASO%: 0.2 % (ref 0.0–2.0)
BASOS ABS: 0 10*3/uL (ref 0.0–0.1)
EOS%: 0.2 % (ref 0.0–7.0)
Eosinophils Absolute: 0 10*3/uL (ref 0.0–0.5)
HCT: 39.4 % (ref 34.8–46.6)
HEMOGLOBIN: 13.1 g/dL (ref 11.6–15.9)
IMMATURE RETIC FRACT: 3.9 % (ref 1.60–10.00)
LYMPH#: 0.7 10*3/uL — AB (ref 0.9–3.3)
LYMPH%: 11.9 % — ABNORMAL LOW (ref 14.0–49.7)
MCH: 24.8 pg — ABNORMAL LOW (ref 25.1–34.0)
MCHC: 33.2 g/dL (ref 31.5–36.0)
MCV: 74.6 fL — ABNORMAL LOW (ref 79.5–101.0)
MONO#: 0.8 10*3/uL (ref 0.1–0.9)
MONO%: 13.3 % (ref 0.0–14.0)
NEUT#: 4.2 10*3/uL (ref 1.5–6.5)
NEUT%: 74.4 % (ref 38.4–76.8)
PLATELETS: 241 10*3/uL (ref 145–400)
RBC: 5.28 10*6/uL (ref 3.70–5.45)
RDW: 15.3 % — ABNORMAL HIGH (ref 11.2–14.5)
RETIC CT ABS: 93.46 10*3/uL — AB (ref 33.70–90.70)
Retic %: 1.77 % (ref 0.70–2.10)
WBC: 5.7 10*3/uL (ref 3.9–10.3)

## 2016-11-02 LAB — COMPREHENSIVE METABOLIC PANEL
ALBUMIN: 3.7 g/dL (ref 3.5–5.0)
ALT: 13 U/L (ref 0–55)
ANION GAP: 11 meq/L (ref 3–11)
AST: 17 U/L (ref 5–34)
Alkaline Phosphatase: 56 U/L (ref 40–150)
BILIRUBIN TOTAL: 0.49 mg/dL (ref 0.20–1.20)
BUN: 10.9 mg/dL (ref 7.0–26.0)
CO2: 25 meq/L (ref 22–29)
CREATININE: 0.7 mg/dL (ref 0.6–1.1)
Calcium: 9.1 mg/dL (ref 8.4–10.4)
Chloride: 99 mEq/L (ref 98–109)
EGFR: >90 mL/min/{1.73_m2} (ref >90)
Glucose: 106 mg/dl (ref 70–140)
Potassium: 3.6 mEq/L (ref 3.5–5.1)
SODIUM: 136 meq/L (ref 136–145)
TOTAL PROTEIN: 6.7 g/dL (ref 6.4–8.3)

## 2016-11-02 LAB — URIC ACID: Uric Acid, Serum: 6.4 mg/dl (ref 2.6–7.4)

## 2016-11-02 LAB — LACTATE DEHYDROGENASE: LDH: 193 U/L (ref 125–245)

## 2016-11-02 LAB — PHOSPHORUS: Phosphorus: 2.8 mg/dL (ref 2.5–4.6)

## 2016-11-03 ENCOUNTER — Other Ambulatory Visit: Payer: Self-pay | Admitting: Family Medicine

## 2016-11-03 ENCOUNTER — Telehealth: Payer: Self-pay | Admitting: *Deleted

## 2016-11-03 ENCOUNTER — Other Ambulatory Visit: Payer: Self-pay | Admitting: Radiology

## 2016-11-03 ENCOUNTER — Ambulatory Visit: Payer: Self-pay | Admitting: Cardiovascular Disease

## 2016-11-03 DIAGNOSIS — Z638 Other specified problems related to primary support group: Secondary | ICD-10-CM

## 2016-11-03 NOTE — Progress Notes (Signed)
Marland Kitchen    HEMATOLOGY/ONCOLOGY CLINIC NOTE  Date of Service: .11/02/2016  Patient Care Team: Denita Lung, MD as PCP - General (Family Medicine)  CHIEF COMPLAINTS/PURPOSE OF CONSULTATION:   lymphoma  HISTORY OF PRESENTING ILLNESS:   Denise [name] is a wonderful 61 y.o. female who has been referred to [name] by Dr .Wyatt Haste, MD  for evaluation and management of new concern for lymphoma.  Patient is a relatively healthy 61 year old female with a history of asthma, seasonal allergies, sleep apnea with questionable compliance with CPAP machine Who has been referred to [name] by Dr. Redmond School as an urgent consultation for concern of rapidly progressing lymphoma.  Patient had presented with shingles on her right posterior chest wall on 09/22/2016 along with some symptoms of chills night sweats decreased appetite and some shortness of breath and some enlarged lymph nodes in the neck.  She was treated with Valtrex with resolution of her painful right chest wall skin rash which has now crusted over but still remains somewhat uncomfortable.  She notes that she had continued difficulty with fatigue increasing shortness of breath and some sensations of nausea. She notes no objective fevers but has had chills and drenching night sweats for the last 23 weeks. She also has noted increasing swelling of her neck lymph nodes and a persistent cough that has been keeping her up at night.  She reports weight loss of about 7-8 pounds over the last 3 weeks and significant fatigue.   She is here with her female partner for further evaluation and management and is extremely anxious as is understandable .  She has had a CT of the chest with her primary care physician on 10/15/2016 which showed Bulky lymphadenopathy identified in the supraclavicular regions bilaterally, mediastinum, both hila, and upper abdomen. Imaging features highly suspicious for lymphoma. Metastatic disease considered less  likely.  Patient does not have any overt inspiratory stridor or wheezing at this time or overt respiratory distress .  INTERVAL HISTORY  Denise [name] is here with her partner for her scheduled follow-up to discuss her new diagnosis of peripheral T-cell lymphoma and its management. We discussed the final pathology results and details, we discussed the details of the diagnosis, natural history, prognosis, treatment considerations and recommendations for an additional opinion from a transplant center. We went over all the relevant NCCN guidelines. She is agreeable for getting a referral to [facility] for a second opinion/transplant consideration. She has had her chemotherapy counseling for the planned treatment with CHOEP. Pending an echocardiogram on 11/05/2016 and a port placement.. Patient reports that she is tolerating the dexamethasone well and that this has significantly helped her breathing and cough. No fevers no chills. Appears to be more relaxed than on previous visits.   MEDICAL HISTORY:  Past Medical History:  Diagnosis Date  . Allergy   . Asthma   . Bursitis of left hip   . Fibroids    TAH in 1999  . GERD (gastroesophageal reflux disease)   . PVC's (premature ventricular contractions)   . Sleep apnea     SURGICAL HISTORY: Past Surgical History:  Procedure Laterality Date  . CARDIAC CATH  05/2007   J. [facility]Y  . HAMMER TOE SURGERY Bilateral 2016  . TOTAL ABDOMINAL HYSTERECTOMY  05/15/1998   secondary to fibroids  . WISDOM TOOTH EXTRACTION  mid teens    SOCIAL HISTORY: Social History   Social History  . Marital status: Significant Other    Spouse name: N/A  .  Number of children: 0  . Years of education: N/A   Occupational History  .  [employer] History Main Topics  . Smoking status: Never Smoker  . Smokeless tobacco: Never Used  . Alcohol use 0.6 - 1.2 oz/week    1 - 2 Standard drinks or equivalent per week     Comment: only if out to  dinner, maybe once a month  . Drug use: No  . Sexual activity: Yes    Partners: Female    Birth control/ protection: None   Other Topics Concern  . Not on file   Social History Narrative  . No narrative on file    FAMILY HISTORY: Family History  Problem Relation Age of Onset  . Cancer Maternal Grandmother     GI cancer in 49's  . Dementia Mother   . Heart disease Father   . Heart failure Father     ALLERGIES:  is allergic to morphine and related.  MEDICATIONS:  Current Outpatient Prescriptions  Medication Sig Dispense Refill  . acyclovir (ZOVIRAX) 400 MG tablet Take 1 tablet (400 mg total) by mouth 2 (two) times daily. 60 tablet 2  . ADVAIR DISKUS 100-50 MCG/DOSE AEPB Inhale 1 puff into the lungs daily.     Marland Kitchen albuterol (PROVENTIL HFA;VENTOLIN HFA) 108 (90 Base) MCG/ACT inhaler Inhale 2 puffs into the lungs every 6 (six) hours as needed for wheezing or shortness of breath. 1 Inhaler 0  . allopurinol (ZYLOPRIM) 300 MG tablet Take 1 tablet (300 mg total) by mouth daily. 30 tablet 3  . ALPRAZolam (XANAX) 0.25 MG tablet TAKE 1 TABLET BY MOUTH TWICE A DAY AS NEEDED FOR ANXIETY 20 tablet 0  . aspirin 81 MG chewable tablet Chew 81 mg by mouth daily.    Marland Kitchen atenolol (TENORMIN) 50 MG tablet Take 1 tablet by mouth 2 (two) times daily.  0  . benzonatate (TESSALON) 100 MG capsule Take 1 capsule (100 mg total) by mouth 3 (three) times daily as needed for cough. 30 capsule 0  . dexamethasone (DECADRON) 4 MG tablet Take 1 tablet (4 mg total) by mouth daily with lunch. 30 tablet 0  . DULoxetine (CYMBALTA) 30 MG capsule Take 1 capsule (30 mg total) by mouth daily. 30 capsule 3  . esomeprazole (NEXIUM) 40 MG capsule Take 1 capsule (40 mg total) by mouth daily before breakfast. 100 capsule 0  . fluticasone (FLONASE) 50 MCG/ACT nasal spray Place 2 sprays into both nostrils daily. 16 g 11  . hydrochlorothiazide (MICROZIDE) 12.5 MG capsule TAKE ONE CAPSULE BY MOUTH EVERY DAY 30 capsule 11  .  lidocaine-prilocaine (EMLA) cream Apply to affected area once 30 g 3  . meloxicam (MOBIC) 15 MG tablet TAKE 1 TABLET BY MOUTH EVERY DAY 30 tablet 2  . ondansetron (ZOFRAN ODT) 8 MG disintegrating tablet Take 1 tablet (8 mg total) by mouth every 8 (eight) hours as needed for nausea or vomiting. 20 tablet 0  . ondansetron (ZOFRAN) 8 MG tablet Take 1 tablet (8 mg total) by mouth 2 (two) times daily as needed for refractory nausea / vomiting. 30 tablet 1  . prochlorperazine (COMPAZINE) 10 MG tablet Take 1 tablet (10 mg total) by mouth every 6 (six) hours as needed (Nausea or vomiting). 30 tablet 6  . triazolam (HALCION) 0.125 MG tablet Take 0.125 mg by mouth at bedtime.    . triazolam (HALCION) 0.25 MG tablet TAKE 1 TABLET BY MOUTH AT BEDTIME AS NEEDED FOR SLEEP (MAX  30/75 DAYS PER INS) 30 tablet 0   No current facility-administered medications for this visit.     REVIEW OF SYSTEMS:    10 Point review of Systems was done is negative except as noted above.  PHYSICAL EXAMINATION: ECOG PERFORMANCE STATUS: 1 - Symptomatic but completely ambulatory  . Vitals:   11/02/16 0945  BP: (!) 164/72  Pulse: 69  Resp: 18  Temp: 98 F (36.7 C)   Filed Weights   11/02/16 0945  Weight: 182 lb 1.6 oz (82.6 kg)   .Body mass index is 29.39 kg/m.  GENERAL:alert, in no acute distress and comfortable SKIN: skin color, texture, turgor are normal, no rashes or significant lesions EYES: normal, conjunctiva are pink and non-injected, sclera clear OROPHARYNX:no exudate, no erythema and lips, buccal mucosa, and tongue normal  NECK: supple, no JVD,No inspiratory stridor, no facial swelling or upper extremity swelling. LYMPH: Extensive bilateral supraclavicular lymphadenopathy, no palpable inguinal or axillary lymphadenopathy noted. LUNGS: clear to auscultation with normal respiratory effort HEART: regular rate & rhythm,  no murmurs and no lower extremity edema ABDOMEN: abdomen soft, non-tender, normoactive  bowel sounds  Musculoskeletal: no cyanosis of digits and no clubbing  PSYCH: alert & oriented x 3 with fluent speech NEURO: no focal motor/sensory deficits  LABORATORY DATA:  I have reviewed the data as listed  . CBC Latest Ref Rng & Units 11/02/2016 10/21/2016 10/16/2016  WBC 3.9 - 10.3 10e3/uL 5.7 5.3 4.6  Hemoglobin 11.6 - 15.9 g/dL 13.1 12.9 12.5  Hematocrit 34.8 - 46.6 % 39.4 37.5 36.8  Platelets 145 - 400 10e3/uL 241 201 174    CMP Latest Ref Rng & Units 11/02/2016 10/16/2016 10/15/2016  Glucose 70 - 140 mg/dl 106 109 135(H)  BUN 7.0 - 26.0 mg/dL 10.9 8.3 9  Creatinine 0.6 - 1.1 mg/dL 0.7 0.7 0.65  Sodium 136 - 145 mEq/L 136 133(L) 132(L)  Potassium 3.5 - 5.1 mEq/L 3.6 3.4(L) 3.3(L)  Chloride 98 - 110 mmol/L - - 96(L)  CO2 22 - 29 mEq/L 25 26 25   Calcium 8.4 - 10.4 mg/dL 9.1 9.3 8.4(L)  Total Protein 6.4 - 8.3 g/dL 6.7 7.1 6.2  Total Bilirubin 0.20 - 1.20 mg/dL 0.49 0.46 0.4  Alkaline Phos 40 - 150 U/L 56 82 63  AST 5 - 34 U/L 17 21 16   ALT 0 - 55 U/L 13 12 8    . Lab Results  Component Value Date   LDH 193 11/02/2016   Component     Latest Ref Rng & Units 10/16/2016  Uric Acid, Serum     2.6 - 7.4 mg/dl 6.4  Phosphorus     2.5 - 4.5 mg/dL 3.6  LDH     125 - 245 U/L 247 (H)  Hep C Virus Ab     0.0 - 0.9 s/co ratio <0.1  Hepatitis B Surface Ag     Negative Negative  Hep B Core Ab, Tot     Negative Negative  HIV     Non Reactive Non Reactive        RADIOGRAPHIC STUDIES: I have personally reviewed the radiological images as listed and agreed with the findings in the report. Dg Chest 2 View  Result Date: 10/15/2016 CLINICAL DATA:  Shortness of breath, cough for 3 weeks EXAM: CHEST  2 VIEW COMPARISON:  None. FINDINGS: Cardiomediastinal silhouette is unremarkable. Mild mid thoracic dextroscoliosis. Mild lower thoracic levoscoliosis. There are degenerative changes thoracic spine. No pulmonary edema. There is streaky infiltrate in right upper lobe  perihilar. This is  highly suspicious for pneumonia. No convincing pulmonary edema. IMPRESSION: Degenerative changes and mild scoliosis thoracic spine. Streaky infiltrate right upper lobe perihilar highly suspicious for pneumonia. Followup PA and lateral chest X-ray is recommended in 3-4 weeks following trial of antibiotic therapy to ensure resolution and exclude underlying malignancy. Electronically Signed   By: Lahoma Crocker M.D.   On: 10/15/2016 10:35   Ct Chest W Contrast  Result Date: 10/15/2016 CLINICAL DATA:  Right parahilar lesions seen on chest x-ray EXAM: CT CHEST WITH CONTRAST TECHNIQUE: Multidetector CT imaging of the chest was performed during intravenous contrast administration. CONTRAST:  71mL ISOVUE-300 IOPAMIDOL (ISOVUE-300) INJECTION 61% COMPARISON:  Chest x-ray 10/15/2016 FINDINGS: Cardiovascular: The heart size is normal. No pericardial effusion. Coronary artery calcification is noted. Atherosclerotic calcification is noted in the wall of the thoracic aorta. Mediastinum/Nodes: Bulky supraclavicular lymphadenopathy is evident. Left supraclavicular nodal conglomeration measures 3.0 x 4.2 cm. Right supraclavicular nodal conglomeration is 3.4 x 3.2 cm. 4.2 x 4.0 cm nodal conglomeration in the superior mediastinum generates mass-effect on the right internal jugular vein at the level of the thoracic inlet. Subcarinal lymph node measures 2.5 cm short axis. Bulky lymphadenopathy is seen in both hilar regions. Lungs/Pleura: Right parahilar nodularity is seen along the inferior right hilum and tracking along the major fissure. 8 mm right middle lobe pulmonary nodule is associated. Left lung clear. Small right pleural effusion noted. Upper Abdomen: Bulky retrocrural, gastrohepatic ligament and hepatoduodenal ligament lymphadenopathy is identified. 2.5 cm short axis retrocaval lymph node is identified in the right retroperitoneum. There is a dominant 3.0 cm short axis in the hepatoduodenal ligament. Musculoskeletal: Bone  windows reveal no worrisome lytic or sclerotic osseous lesions. IMPRESSION: 1. Bulky lymphadenopathy identified in the supraclavicular regions bilaterally, mediastinum, both hila, and upper abdomen. Imaging features highly suspicious for lymphoma. Metastatic disease considered less likely. 2. Parahilar abnormality on chest x-ray earlier today was due to nodularity in the lung parenchyma adjacent to the right hilum, mainly along the major fissure. 8 mm nodule identified central right middle lobe. 3. Small right pleural effusion. Electronically Signed   By: Misty Stanley M.D.   On: 10/15/2016 12:56   Nm Pet Image Initial (pi) Skull Base To Thigh  Result Date: 10/27/2016 CLINICAL DATA:  Initial treatment strategy for lymphoma. EXAM: NUCLEAR MEDICINE PET SKULL BASE TO THIGH TECHNIQUE: 9.1 mCi F-18 FDG was injected intravenously. Full-ring PET imaging was performed from the skull base to thigh after the radiotracer. CT data was obtained and used for attenuation correction and anatomic localization. FASTING BLOOD GLUCOSE:  Value: 137 mg/dl COMPARISON:  Chest CT on 10/15/2016 FINDINGS: NECK Diffuse hypermetabolic lymphadenopathy is seen throughout the jugular chains, posterior triangles, and supraclavicular regions bilaterally. Index lymph node mass in the left lower jugular and supraclavicular region measures 3.5 cm on image 38/4, with SUV max of 22.3. CHEST Hypermetabolic lymphadenopathy is seen throughout the mediastinum, bilateral hilar regions, bilateral internal mammary chains, right subpectoral and axillary regions, and bilateral retrocrural regions. Index nodal mass in the high right paratracheal region measures 3.9 cm on image 53/4 with SUV max of 22.3. Ill-defined rounded masslike opacity in central right lung perihilar region with multiple adjacent satellite nodules is hypermetabolic. This measures 3.4 cm in the central right lower lobe on image 29/7, and is hypermetabolic with SUV max of 89.2. Increased  size of moderate bilateral pleural effusions since previous study, however the hypermetabolic pleural masses are seen. ABDOMEN/PELVIS No abnormal hypermetabolic activity within the liver, pancreas, adrenal  glands, or spleen. No hypermetabolic lymph nodes in the abdomen or pelvis. Bulky abdominal hypermetabolic lymphadenopathy is seen in the gastrohepatic ligament, porta hepatis, small bowel mesentery, and left paraaortic and aortocaval spaces. Index lymphadenopathy in the left para-aortic region measures 3.8 cm on image 111/4, with SUV max of 21.4. A 10 mm hypermetabolic right external iliac lymph node is seen on image 155/4, with SUV max of 4.5. Mild ascites noted within the abdomen and pelvis. Prior hysterectomy. SKELETON No focal hypermetabolic activity to suggest skeletal metastasis. IMPRESSION: Bulky hypermetabolic lymphadenopathy throughout the neck, chest, and abdomen. 1 cm hypermetabolic pelvic lymph node in right external iliac chain. Hypermetabolic masslike opacity in central right lung with multiple adjacent hypermetabolic nodules. This is suspicious for pulmonary involvement by lymphoma, although differential diagnosis also includes infectious and inflammatory etiologies. Increased moderate bilateral pleural effusions. No hypermetabolic pleural masses identified. Electronically Signed   By: Earle Gell M.D.   On: 10/27/2016 10:22   US Biopsy  Result Date: 10/21/2016 CLINICAL DATA:  Diffuse lymphadenopathy in the lower neck, chest and upper abdomen by prior CT. EXAM: ULTRASOUND GUIDED CORE BIOPSY OF LEFT SUPRACLAVICULAR LYMPH NODE MEDICATIONS: 3.0 mg IV Versed; 100 mcg IV Fentanyl Total Moderate Sedation Time: 13 minutes. The patient's level of consciousness and physiologic status were continuously monitored during the procedure by Radiology nursing. PROCEDURE: The procedure, risks, benefits, and alternatives were explained to the patient. Questions regarding the procedure were encouraged and  answered. The patient understands and consents to the procedure. A time out was performed prior to initiating the procedure. Ultrasound was performed of both sides of the neck. The left neck was prepped with chlorhexidine in a sterile fashion, and a sterile drape was applied covering the operative field. A sterile gown and sterile gloves were used for the procedure. Local anesthesia was provided with 1% Lidocaine. Under direct ultrasound guidance, a total of 5 separate 16 gauge core biopsy samples were obtained of 2 adjacent lymph nodes in the left supraclavicular region. Core biopsy samples were submitted in saline. COMPLICATIONS: None. FINDINGS: There are a multitude of enlarged lymph nodes in both sides of the neck. The largest cluster of enlarged nodes was in the left supraclavicular region. Core biopsy samples yielded solid tissue. IMPRESSION: Ultrasound-guided core biopsy performed of left supraclavicular lymphadenopathy. Electronically Signed   By: Aletta Edouard M.D.   On: 10/21/2016 12:30    ASSESSMENT & PLAN:   61 year old female with  #1 Newly diagnosed Stage IVBE Peripheral T cell lymphoma NOS  PET/CT scan shows bulky hypermetabolic lymphadenopathy throughout the neck, chest, and abdomen. 1 cm hypermetabolic pelvic lymph node in right external iliac chain. Hypermetabolic masslike opacity in central right lung with multiple adjacent hypermetabolic nodules. This is suspicious for pulmonary involvement by lymphoma  Fatigue anorexia or night sweats-drenching, and weight loss suggest constitutional type B symptoms.  Hepatitis profile and HIV neg  #2 Impending thoracic outlet obstruction due to bulky lymphadenopathy - no clinical symptoms suggestive of this today  #3 Recent shingles infection on chest wall -resolved Plan We discussed the final pathology results and details, we discussed the details of the diagnosis, natural history, prognosis, treatment considerations and recommendations  for an additional opinion from a transplant center. We went over all the relevant NCCN guidelines. --Echo to assess cardiac function and ejection fraction -pending -Hepatitis profile and HIV testing was done which was negative. -continue the patient on acyclovir prophylaxis in the setting of an immunocompromised state and need for chemotherapy. -we will plan to  treatment patient with CHOEP with G-CSF support provided pending ECHO shows nl EF -chemo-counseling done -porta-c-cath placement scheduled -chemotherapy to start 11/09/2016  - she will need start on allopurinol for tumor lysis syndrome prophylaxis. -referred to [facility] (Dr Dellis Filbert) for a 2nd opinion and more importantly to connect with the team incase she has refractory disease and since she might need consideration for AutoHSCT for consolidation later.   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 -In the setting of asthma with her primary care physician consider transitioning to a calcium channel blocker if appropriate.  #4 history of sleep apnea has been noncompliant with her CPAP use. She does have a CPAP machine after counseling has been using this regularly  #5 severe anxiety - this appears somewhat improved -Has been using Xanax when necessary.  -continue on Cymbalta 30 mg by mouth daily.  #6 persistent dry cough likely due to airway irritation from mediastinal adenopathy. - much improved with dexamethasone Plan Continue Dexamethasone until chemotherapy starts Tessalon Perles when necessary -improved.  f/u for ECHO and port placement as per schedule Chemotherapy starting 11/09/2016 RTC with Dr Irene Limbo in 2 weeks for toxicity check with labs    All of the patients questions were answered with apparent satisfaction. The patient knows to call the clinic with any problems, questions or concerns.  I spent 30 minutes counseling the patient face to face. The total time spent in the appointment was 40 minutes and more than 50%  was on counseling and direct patient cares.    Sullivan Lone MD Nunez AAHIVMS [facility] Hematology/Oncology Physician [facility]  (Office):       [phone] (Work cell):  [phone] (Fax):           [phone]  11/03/2016 7:47 AM

## 2016-11-03 NOTE — Telephone Encounter (Signed)
Is this okay to refill? 

## 2016-11-03 NOTE — Telephone Encounter (Signed)
Per staff message, consult with MD Dellis Filbert 12/01/16 @ 745 am. LVM for call back

## 2016-11-03 NOTE — Telephone Encounter (Signed)
-----   Message from Brunetta Genera, MD sent at 11/03/2016  8:14 AM EDT ----- Regarding: Referral to Dr Dellis Filbert [facility], Could you plz help arrange referral to Dr Dellis Filbert for a 2nd opinion at [facility] (newly diagnosed PTCL NOS)-non urgent consultation - patient will eventually need to be considered for possible consolidation with AutoHSCT if she responds well to treatment.  Thanks, GK

## 2016-11-04 ENCOUNTER — Ambulatory Visit (HOSPITAL_COMMUNITY)
Admission: RE | Admit: 2016-11-04 | Discharge: 2016-11-04 | Disposition: A | Discharge status: Home / Self Care | Payer: BC Managed Care – PPO | Source: Ambulatory Visit | Attending: Hematology | Admitting: Hematology

## 2016-11-04 ENCOUNTER — Other Ambulatory Visit: Payer: Self-pay | Admitting: General Surgery

## 2016-11-04 ENCOUNTER — Other Ambulatory Visit: Payer: Self-pay | Admitting: Physician Assistant

## 2016-11-04 DIAGNOSIS — Z885 Allergy status to narcotic agent status: Secondary | ICD-10-CM | POA: Insufficient documentation

## 2016-11-04 DIAGNOSIS — Z79899 Other long term (current) drug therapy: Secondary | ICD-10-CM | POA: Insufficient documentation

## 2016-11-04 DIAGNOSIS — J45909 Unspecified asthma, uncomplicated: Secondary | ICD-10-CM | POA: Insufficient documentation

## 2016-11-04 DIAGNOSIS — C8448 Peripheral T-cell lymphoma, not classified, lymph nodes of multiple sites: Secondary | ICD-10-CM | POA: Diagnosis not present

## 2016-11-04 DIAGNOSIS — Z7982 Long term (current) use of aspirin: Secondary | ICD-10-CM | POA: Insufficient documentation

## 2016-11-04 DIAGNOSIS — I071 Rheumatic tricuspid insufficiency: Secondary | ICD-10-CM | POA: Diagnosis not present

## 2016-11-04 DIAGNOSIS — Z7951 Long term (current) use of inhaled steroids: Secondary | ICD-10-CM | POA: Diagnosis not present

## 2016-11-04 DIAGNOSIS — K219 Gastro-esophageal reflux disease without esophagitis: Secondary | ICD-10-CM | POA: Diagnosis not present

## 2016-11-04 DIAGNOSIS — C8598 Non-Hodgkin lymphoma, unspecified, lymph nodes of multiple sites: Secondary | ICD-10-CM | POA: Diagnosis not present

## 2016-11-04 DIAGNOSIS — G4730 Sleep apnea, unspecified: Secondary | ICD-10-CM | POA: Diagnosis not present

## 2016-11-04 LAB — ECHOCARDIOGRAM COMPLETE
CHL CUP RV SYS PRESS: 26 mmHg
CHL CUP STROKE VOLUME: 35 mL
CHL CUP TV REG PEAK VELOCITY: 240 cm/s
EERAT: 10.02
EWDT: 268 ms
FS: 17 % — AB (ref 28–44)
IV/PV OW: 1.11
LA ID, A-P, ES: 38 mm
LA diam end sys: 38 mm
LA vol A4C: 38 ml
LA vol index: 22.1 mL/m2
LA vol: 42.5 mL
LADIAMINDEX: 1.98 cm/m2
LV PW d: 9.63 mm — AB (ref 0.6–1.1)
LV dias vol index: 32 mL/m2
LV dias vol: 62 mL (ref 46–106)
LV e' LATERAL: 8.27 cm/s
LV sys vol index: 14 mL/m2
LVEEAVG: 10.02
LVEEMED: 10.02
LVOT SV: 69 mL
LVOT VTI: 24.4 cm
LVOT area: 2.84 cm2
LVOT diameter: 19 mm
LVOT peak grad rest: 10 mmHg
LVOT peak vel: 159 cm/s
LVSYSVOL: 27 mL (ref 14–42)
Lateral S' vel: 16 cm/s
MV Dec: 268
MVPG: 3 mmHg
MVPKAVEL: 77.6 m/s
MVPKEVEL: 82.9 m/s
Simpson's disk: 56
TDI e' lateral: 8.27
TDI e' medial: 5.33
TR max vel: 240 cm/s

## 2016-11-04 NOTE — Progress Notes (Signed)
  Echocardiogram 2D Echocardiogram has been performed.  Tresa Res 11/04/2016, 10:57 AM

## 2016-11-05 ENCOUNTER — Ambulatory Visit (HOSPITAL_COMMUNITY)
Admission: RE | Admit: 2016-11-05 | Discharge: 2016-11-05 | Disposition: A | Discharge status: Home / Self Care | Payer: BC Managed Care – PPO | Source: Ambulatory Visit | Attending: Hematology | Admitting: Hematology

## 2016-11-05 ENCOUNTER — Other Ambulatory Visit: Payer: Self-pay | Admitting: Hematology

## 2016-11-05 ENCOUNTER — Encounter (HOSPITAL_COMMUNITY): Payer: Self-pay

## 2016-11-05 ENCOUNTER — Other Ambulatory Visit: Payer: Self-pay | Admitting: *Deleted

## 2016-11-05 DIAGNOSIS — C8448 Peripheral T-cell lymphoma, not classified, lymph nodes of multiple sites: Secondary | ICD-10-CM

## 2016-11-05 HISTORY — PX: IR GENERIC HISTORICAL: IMG1180011

## 2016-11-05 LAB — CBC WITH DIFFERENTIAL/PLATELET
BASOS ABS: 0 10*3/uL (ref 0.0–0.1)
Basophils Relative: 0 %
Eosinophils Absolute: 0 10*3/uL (ref 0.0–0.7)
Eosinophils Relative: 1 %
HEMATOCRIT: 39.2 % (ref 36.0–46.0)
Hemoglobin: 13.2 g/dL (ref 12.0–15.0)
Lymphocytes Relative: 11 %
Lymphs Abs: 0.7 10*3/uL (ref 0.7–4.0)
MCH: 25.2 pg — ABNORMAL LOW (ref 26.0–34.0)
MCHC: 33.7 g/dL (ref 30.0–36.0)
MCV: 74.8 fL — ABNORMAL LOW (ref 78.0–100.0)
MONOS PCT: 12 %
Monocytes Absolute: 0.8 10*3/uL (ref 0.1–1.0)
NEUTROS ABS: 5.1 10*3/uL (ref 1.7–7.7)
NEUTROS PCT: 76 %
Platelets: 216 10*3/uL (ref 150–400)
RBC: 5.24 MIL/uL — AB (ref 3.87–5.11)
RDW: 15.5 % (ref 11.5–15.5)
WBC: 6.6 10*3/uL (ref 4.0–10.5)

## 2016-11-05 MED ORDER — CEFAZOLIN SODIUM-DEXTROSE 2-4 GM/100ML-% IV SOLN
INTRAVENOUS | Status: DC
Start: 2016-11-05 — End: 2016-11-06
  Filled 2016-11-05: qty 100

## 2016-11-05 MED ORDER — CEFAZOLIN SODIUM-DEXTROSE 2-4 GM/100ML-% IV SOLN
2.0000 g | INTRAVENOUS | Status: AC
  Administered 2016-11-05: 2 g via INTRAVENOUS

## 2016-11-05 MED ORDER — NALOXONE HCL 0.4 MG/ML IJ SOLN
INTRAMUSCULAR | Status: AC
  Filled 2016-11-05: qty 1

## 2016-11-05 MED ORDER — LIDOCAINE HCL 1 % IJ SOLN
INTRAMUSCULAR | Status: AC
  Filled 2016-11-05: qty 20

## 2016-11-05 MED ORDER — SODIUM CHLORIDE 0.9 % IV SOLN
INTRAVENOUS | Status: DC
  Administered 2016-11-05: 12:00:00 via INTRAVENOUS

## 2016-11-05 MED ORDER — MIDAZOLAM HCL 2 MG/2ML IJ SOLN
INTRAMUSCULAR | Status: AC
  Filled 2016-11-05: qty 4

## 2016-11-05 MED ORDER — MIDAZOLAM HCL 2 MG/2ML IJ SOLN
INTRAMUSCULAR | Status: AC | PRN
  Administered 2016-11-05 (×4): 1 mg via INTRAVENOUS

## 2016-11-05 MED ORDER — FENTANYL CITRATE (PF) 100 MCG/2ML IJ SOLN
INTRAMUSCULAR | Status: AC | PRN
  Administered 2016-11-05 (×4): 50 ug via INTRAVENOUS

## 2016-11-05 MED ORDER — FLUMAZENIL 0.5 MG/5ML IV SOLN
INTRAVENOUS | Status: AC
  Filled 2016-11-05: qty 5

## 2016-11-05 MED ORDER — HEPARIN SOD (PORK) LOCK FLUSH 100 UNIT/ML IV SOLN
INTRAVENOUS | Status: AC
  Filled 2016-11-05: qty 5

## 2016-11-05 MED ORDER — FENTANYL CITRATE (PF) 100 MCG/2ML IJ SOLN
INTRAMUSCULAR | Status: AC
  Filled 2016-11-05: qty 4

## 2016-11-05 NOTE — Procedures (Signed)
RIJV PAC SVC RA Comp 0  EBL 0

## 2016-11-05 NOTE — H&P (Signed)
Referring Physician(s): Brunetta Genera  Supervising Physician: [name]  Patient Status:   [facility]  Chief Complaint: "I'm here for a port a cath"   Subjective: Patient familiar to IR service from prior left supraclavicular lymph node biopsy in March of this year. She has a history of newly diagnosed peripheral T-cell lymphoma and presents today for Port-A-Cath placement for chemotherapy. She currently denies fever, chest pain, abdominal/back pain, nausea, vomiting or abnormal bleeding. She does have occasional headaches, some dyspnea with exertion and occasional cough. She is anxious.  Past Medical History:  Diagnosis Date  . Allergy   . Asthma   . Bursitis of left hip   . Fibroids    TAH in 1999  . GERD (gastroesophageal reflux disease)   . PVC's (premature ventricular contractions)   . Sleep apnea    Past Surgical History:  Procedure Laterality Date  . CARDIAC CATH  05/2007   [facility]  . HAMMER TOE SURGERY Bilateral 2016  . TOTAL ABDOMINAL HYSTERECTOMY  05/15/1998   secondary to fibroids  . WISDOM TOOTH EXTRACTION  mid teens      Allergies: Morphine and related  Medications: Prior to Admission medications   Medication Sig Start Date End Date Taking? Authorizing Provider  acyclovir (ZOVIRAX) 400 MG tablet Take 1 tablet (400 mg total) by mouth 2 (two) times daily. 10/16/16   Brunetta Genera, MD  ADVAIR DISKUS 100-50 MCG/DOSE AEPB Inhale 1 puff into the lungs daily.  01/15/14   Historical Provider, MD  albuterol (PROVENTIL HFA;VENTOLIN HFA) 108 (90 Base) MCG/ACT inhaler Inhale 2 puffs into the lungs every 6 (six) hours as needed for wheezing or shortness of breath. 07/06/16   Denita Lung, MD  allopurinol (ZYLOPRIM) 300 MG tablet Take 1 tablet (300 mg total) by mouth daily. 10/29/16   Brunetta Genera, MD  ALPRAZolam Duanne Moron) 0.25 MG tablet TAKE 1 TABLET BY MOUTH TWICE A DAY AS NEEDED FOR ANXIETY 11/03/16   Denita Lung, MD  aspirin 81 MG chewable tablet  Chew 81 mg by mouth daily.    Historical Provider, MD  atenolol (TENORMIN) 50 MG tablet Take 1 tablet by mouth 2 (two) times daily. 08/18/16   Historical Provider, MD  benzonatate (TESSALON) 100 MG capsule Take 1 capsule (100 mg total) by mouth 3 (three) times daily as needed for cough. 10/26/16   Brunetta Genera, MD  dexamethasone (DECADRON) 4 MG tablet Take 1 tablet (4 mg total) by mouth daily with lunch. 10/26/16   Brunetta Genera, MD  DULoxetine (CYMBALTA) 30 MG capsule Take 1 capsule (30 mg total) by mouth daily. 10/16/16   Brunetta Genera, MD  esomeprazole (NEXIUM) 40 MG capsule Take 1 capsule (40 mg total) by mouth daily before breakfast. 02/22/14   Denita Lung, MD  fluticasone [facility]) 50 MCG/ACT nasal spray Place 2 sprays into both nostrils daily. 09/08/13   Denita Lung, MD  hydrochlorothiazide (MICROZIDE) 12.5 MG capsule TAKE ONE CAPSULE BY MOUTH EVERY DAY 07/14/16   Denita Lung, MD  lidocaine-prilocaine (EMLA) cream Apply to affected area once 10/29/16   Brunetta Genera, MD  meloxicam (MOBIC) 15 MG tablet TAKE 1 TABLET BY MOUTH EVERY DAY 07/14/16   Denita Lung, MD  ondansetron (ZOFRAN ODT) 8 MG disintegrating tablet Take 1 tablet (8 mg total) by mouth every 8 (eight) hours as needed for nausea or vomiting. 10/20/16   Denita Lung, MD  ondansetron (ZOFRAN) 8 MG tablet Take 1  tablet (8 mg total) by mouth 2 (two) times daily as needed for refractory nausea / vomiting. 10/29/16   Brunetta Genera, MD  prochlorperazine (COMPAZINE) 10 MG tablet Take 1 tablet (10 mg total) by mouth every 6 (six) hours as needed (Nausea or vomiting). 10/29/16   Brunetta Genera, MD  triazolam (HALCION) 0.125 MG tablet Take 0.125 mg by mouth at bedtime.    Historical Provider, MD  triazolam (HALCION) 0.25 MG tablet TAKE 1 TABLET BY MOUTH AT BEDTIME AS NEEDED FOR SLEEP (MAX 30/75 DAYS PER INS) 10/06/16   Denita Lung, MD     Vital Signs: BP (!) 146/82 (BP Location: Left Arm)   Pulse 72    Temp 97.8 F (36.6 C) (Oral)   Resp 18   LMP 04/25/1998 (Exact Date)   SpO2 97%   Physical Exam awake, alert. Supraclavicular adenopathy noted ;Chest with dim BS bases . Heart with regular rate and rhythm. Abdomen soft, sl dist, positive bowel sounds, nontender. No significant lower extremity edema.  Imaging: No results found.  Labs:  CBC:  Recent Labs  10/15/16 0946 10/16/16 1208 10/21/16 0923 11/02/16 0929  WBC 4.0 4.6 5.3 5.7  HGB 12.0 12.5 12.9 13.1  HCT 36.8 36.8 37.5 39.4  PLT 190 174 201 241    COAGS:  Recent Labs  10/21/16 0923  INR 1.08    BMP:  Recent Labs  02/20/16 1314 09/14/16 1107 10/15/16 0946 10/16/16 1208 11/02/16 0929  NA 138 138 132* 133* 136  K 3.8 4.4 3.3* 3.4* 3.6  CL 101 101 96*  --   --   CO2 24 27 25 26 25   GLUCOSE 98 105* 135* 109 106  BUN 18 11 9  8.3 10.9  CALCIUM 8.9 9.1 8.4* 9.3 9.1  CREATININE 0.87 0.68 0.65 0.7 0.7    LIVER FUNCTION TESTS:  Recent Labs  09/14/16 1107 10/15/16 0946 10/16/16 1208 11/02/16 0929  BILITOT 0.5 0.4 0.46 0.49  AST 19 16 21 17   ALT 14 8 12 13   ALKPHOS 80 63 82 56  PROT 7.0 6.2 7.1 6.7  ALBUMIN 4.1 3.6 3.6 3.7    Assessment and Plan: Patient with history of newly diagnosed peripheral T-cell lymphoma. She presents today for Port-A-Cath placement for chemotherapy.Risks and benefits discussed with the patient including, but not limited to bleeding, infection, pneumothorax, or fibrin sheath development and need for additional procedures. All of the patient's questions were answered, patient is agreeable to proceed.Consent signed and in chart.     Electronically Signed: D. Rowe Robert 11/05/2016, 11:44 AM   I spent a total of 20 minutes  at the the patient's bedside AND on the patient's hospital floor or unit, greater than 50% of which was counseling/coordinating care for port a cath placement

## 2016-11-05 NOTE — Discharge Instructions (Signed)

## 2016-11-06 ENCOUNTER — Telehealth: Payer: Self-pay | Admitting: Hematology

## 2016-11-06 NOTE — Telephone Encounter (Signed)
Scheduled appt per 11/02/2016 los. Patient to receive new schedule when they come in for next visit.

## 2016-11-09 ENCOUNTER — Telehealth: Payer: Self-pay | Admitting: *Deleted

## 2016-11-09 ENCOUNTER — Ambulatory Visit (HOSPITAL_BASED_OUTPATIENT_CLINIC_OR_DEPARTMENT_OTHER): Payer: BC Managed Care – PPO

## 2016-11-09 ENCOUNTER — Other Ambulatory Visit: Payer: Self-pay | Admitting: *Deleted

## 2016-11-09 ENCOUNTER — Telehealth: Payer: Self-pay | Admitting: Hematology

## 2016-11-09 ENCOUNTER — Other Ambulatory Visit: Payer: Self-pay | Admitting: Hematology

## 2016-11-09 VITALS — BP 148/75 | HR 80 | Temp 98.0°F | Resp 16

## 2016-11-09 DIAGNOSIS — C8448 Peripheral T-cell lymphoma, not classified, lymph nodes of multiple sites: Secondary | ICD-10-CM

## 2016-11-09 DIAGNOSIS — Z5111 Encounter for antineoplastic chemotherapy: Secondary | ICD-10-CM | POA: Diagnosis not present

## 2016-11-09 MED ORDER — DEXAMETHASONE SODIUM PHOSPHATE 10 MG/ML IJ SOLN
10.0000 mg | Freq: Once | INTRAMUSCULAR | Status: AC
  Administered 2016-11-09: 10 mg via INTRAVENOUS

## 2016-11-09 MED ORDER — HEPARIN SOD (PORK) LOCK FLUSH 100 UNIT/ML IV SOLN
500.0000 [IU] | Freq: Once | INTRAVENOUS | Status: AC | PRN
  Administered 2016-11-09: 500 [IU]
  Filled 2016-11-09: qty 5

## 2016-11-09 MED ORDER — PALONOSETRON HCL INJECTION 0.25 MG/5ML
INTRAVENOUS | Status: AC
  Filled 2016-11-09: qty 5

## 2016-11-09 MED ORDER — PREDNISONE 20 MG PO TABS: 20.0000 mg | ORAL_TABLET | Freq: Every day | ORAL | 0 refills | Status: DC

## 2016-11-09 MED ORDER — PREDNISONE 20 MG PO TABS: ORAL_TABLET | ORAL | 0 refills | Status: DC

## 2016-11-09 MED ORDER — VINCRISTINE SULFATE CHEMO INJECTION 1 MG/ML
2.0000 mg | Freq: Once | INTRAVENOUS | Status: AC
  Administered 2016-11-09: 2 mg via INTRAVENOUS
  Filled 2016-11-09: qty 2

## 2016-11-09 MED ORDER — SODIUM CHLORIDE 0.9 % IV SOLN: 10.0000 mg | Freq: Once | INTRAVENOUS | Status: DC

## 2016-11-09 MED ORDER — SODIUM CHLORIDE 0.9% FLUSH
10.0000 mL | INTRAVENOUS | Status: DC | PRN
  Administered 2016-11-09: 10 mL
  Filled 2016-11-09: qty 10

## 2016-11-09 MED ORDER — SODIUM CHLORIDE 0.9 % IV SOLN
Freq: Once | INTRAVENOUS | Status: AC
  Administered 2016-11-09: 13:00:00 via INTRAVENOUS

## 2016-11-09 MED ORDER — SODIUM CHLORIDE 0.9 % IV SOLN
100.0000 mg/m2 | Freq: Once | INTRAVENOUS | Status: AC
  Administered 2016-11-09: 200 mg via INTRAVENOUS
  Filled 2016-11-09: qty 10

## 2016-11-09 MED ORDER — SODIUM CHLORIDE 0.9 % IV SOLN
750.0000 mg/m2 | Freq: Once | INTRAVENOUS | Status: AC
  Administered 2016-11-09: 1500 mg via INTRAVENOUS
  Filled 2016-11-09: qty 75

## 2016-11-09 MED ORDER — PALONOSETRON HCL INJECTION 0.25 MG/5ML
0.2500 mg | Freq: Once | INTRAVENOUS | Status: AC
  Administered 2016-11-09: 0.25 mg via INTRAVENOUS

## 2016-11-09 MED ORDER — DEXAMETHASONE SODIUM PHOSPHATE 10 MG/ML IJ SOLN
INTRAMUSCULAR | Status: AC
  Filled 2016-11-09: qty 1

## 2016-11-09 MED ORDER — DOXORUBICIN HCL CHEMO IV INJECTION 2 MG/ML
50.0000 mg/m2 | Freq: Once | INTRAVENOUS | Status: AC
  Administered 2016-11-09: 100 mg via INTRAVENOUS
  Filled 2016-11-09: qty 50

## 2016-11-09 NOTE — Telephone Encounter (Signed)
Left message on voicemail informing pt that prednisone script has been sent to pharmacy. Take 4 tabs QAM for 5 days.

## 2016-11-09 NOTE — Telephone Encounter (Signed)
r/s lab and md appt on 4/17 per Scheduled message from [facility] on 11/09/2016 los. Patient in treatment area and will get an updated schedule when treatment is finished. Per Maeola Harman to let Oren Beckmann know to print schedule.

## 2016-11-09 NOTE — Patient Instructions (Signed)
[place] Discharge Instructions for Patients Receiving Chemotherapy  Today you received the following chemotherapy agents: Adriamycin, Vincristine, Cytoxan and Etoposide.  To help prevent nausea and vomiting after your treatment, we encourage you to take your nausea medication: Compazine. Take one every 6 hours as needed.  If you develop nausea and vomiting that is not controlled by your nausea medication, call the clinic.   BELOW ARE SYMPTOMS THAT SHOULD BE REPORTED IMMEDIATELY:  *FEVER GREATER THAN 100.5 F  *CHILLS WITH OR WITHOUT FEVER  NAUSEA AND VOMITING THAT IS NOT CONTROLLED WITH YOUR NAUSEA MEDICATION  *UNUSUAL SHORTNESS OF BREATH  *UNUSUAL BRUISING OR BLEEDING  TENDERNESS IN MOUTH AND THROAT WITH OR WITHOUT PRESENCE OF ULCERS  *URINARY PROBLEMS  *BOWEL PROBLEMS  UNUSUAL RASH Items with * indicate a potential emergency and should be followed up as soon as possible.  Feel free to call the clinic should you have any questions or concerns. The clinic phone number is [phone].  Please show the [place] at check-in to the Emergency Department and triage nurse.

## 2016-11-10 ENCOUNTER — Ambulatory Visit (HOSPITAL_BASED_OUTPATIENT_CLINIC_OR_DEPARTMENT_OTHER): Payer: BC Managed Care – PPO

## 2016-11-10 VITALS — BP 154/83 | HR 67 | Temp 98.1°F | Resp 17

## 2016-11-10 DIAGNOSIS — C8448 Peripheral T-cell lymphoma, not classified, lymph nodes of multiple sites: Secondary | ICD-10-CM | POA: Diagnosis not present

## 2016-11-10 DIAGNOSIS — Z5111 Encounter for antineoplastic chemotherapy: Secondary | ICD-10-CM | POA: Diagnosis not present

## 2016-11-10 MED ORDER — PROCHLORPERAZINE MALEATE 10 MG PO TABS
ORAL_TABLET | ORAL | Status: AC
  Filled 2016-11-10: qty 1

## 2016-11-10 MED ORDER — PROCHLORPERAZINE EDISYLATE 5 MG/ML IJ SOLN
10.0000 mg | Freq: Once | INTRAMUSCULAR | Status: AC
  Administered 2016-11-10: 10 mg via INTRAVENOUS

## 2016-11-10 MED ORDER — SODIUM CHLORIDE 0.9 % IV SOLN
Freq: Once | INTRAVENOUS | Status: AC
  Administered 2016-11-10: 13:00:00 via INTRAVENOUS

## 2016-11-10 MED ORDER — SODIUM CHLORIDE 0.9 % IV SOLN
100.0000 mg/m2 | Freq: Once | INTRAVENOUS | Status: AC
  Administered 2016-11-10: 200 mg via INTRAVENOUS
  Filled 2016-11-10: qty 10

## 2016-11-10 MED ORDER — SODIUM CHLORIDE 0.9% FLUSH
10.0000 mL | INTRAVENOUS | Status: DC | PRN
  Administered 2016-11-10: 10 mL
  Filled 2016-11-10: qty 10

## 2016-11-10 MED ORDER — PROCHLORPERAZINE EDISYLATE 5 MG/ML IJ SOLN
INTRAMUSCULAR | Status: AC
  Filled 2016-11-10: qty 2

## 2016-11-10 MED ORDER — HEPARIN SOD (PORK) LOCK FLUSH 100 UNIT/ML IV SOLN
500.0000 [IU] | Freq: Once | INTRAVENOUS | Status: AC | PRN
  Administered 2016-11-10: 500 [IU]
  Filled 2016-11-10: qty 5

## 2016-11-10 NOTE — Patient Instructions (Signed)
[facility] Discharge Instructions for Patients Receiving Chemotherapy  Today you received the following chemotherapy agents Etoposide.   To help prevent nausea and vomiting after your treatment, we encourage you to take your nausea medication as prescribed.   If you develop nausea and vomiting that is not controlled by your nausea medication, call the clinic.   BELOW ARE SYMPTOMS THAT SHOULD BE REPORTED IMMEDIATELY:  *FEVER GREATER THAN 100.5 F  *CHILLS WITH OR WITHOUT FEVER  NAUSEA AND VOMITING THAT IS NOT CONTROLLED WITH YOUR NAUSEA MEDICATION  *UNUSUAL SHORTNESS OF BREATH  *UNUSUAL BRUISING OR BLEEDING  TENDERNESS IN MOUTH AND THROAT WITH OR WITHOUT PRESENCE OF ULCERS  *URINARY PROBLEMS  *BOWEL PROBLEMS  UNUSUAL RASH Items with * indicate a potential emergency and should be followed up as soon as possible.  Feel free to call the clinic you have any questions or concerns. The clinic phone number is [phone].  Please show the CHEMO ALERT CARD at check-in to the Emergency Department and triage nurse.   

## 2016-11-11 ENCOUNTER — Ambulatory Visit (HOSPITAL_BASED_OUTPATIENT_CLINIC_OR_DEPARTMENT_OTHER): Payer: BC Managed Care – PPO

## 2016-11-11 ENCOUNTER — Other Ambulatory Visit: Payer: Self-pay | Admitting: Nurse Practitioner

## 2016-11-11 VITALS — BP 174/85 | HR 72 | Temp 98.2°F | Resp 18

## 2016-11-11 DIAGNOSIS — Z5189 Encounter for other specified aftercare: Secondary | ICD-10-CM

## 2016-11-11 DIAGNOSIS — B3781 Candidal esophagitis: Principal | ICD-10-CM

## 2016-11-11 DIAGNOSIS — C8448 Peripheral T-cell lymphoma, not classified, lymph nodes of multiple sites: Secondary | ICD-10-CM | POA: Diagnosis not present

## 2016-11-11 DIAGNOSIS — B370 Candidal stomatitis: Secondary | ICD-10-CM

## 2016-11-11 DIAGNOSIS — Z5111 Encounter for antineoplastic chemotherapy: Secondary | ICD-10-CM

## 2016-11-11 MED ORDER — PROCHLORPERAZINE EDISYLATE 5 MG/ML IJ SOLN
10.0000 mg | Freq: Once | INTRAMUSCULAR | Status: AC
  Administered 2016-11-11: 10 mg via INTRAVENOUS

## 2016-11-11 MED ORDER — HEPARIN SOD (PORK) LOCK FLUSH 100 UNIT/ML IV SOLN
500.0000 [IU] | Freq: Once | INTRAVENOUS | Status: AC | PRN
  Administered 2016-11-11: 500 [IU]
  Filled 2016-11-11: qty 5

## 2016-11-11 MED ORDER — NYSTATIN 100000 UNIT/ML MT SUSP: 5.0000 mL | Freq: Four times a day (QID) | OROMUCOSAL | 2 refills | Status: DC | PRN

## 2016-11-11 MED ORDER — SODIUM CHLORIDE 0.9 % IV SOLN
Freq: Once | INTRAVENOUS | Status: AC
  Administered 2016-11-11: 14:00:00 via INTRAVENOUS

## 2016-11-11 MED ORDER — SODIUM CHLORIDE 0.9 % IV SOLN
100.0000 mg/m2 | Freq: Once | INTRAVENOUS | Status: AC
  Administered 2016-11-11: 200 mg via INTRAVENOUS
  Filled 2016-11-11: qty 10

## 2016-11-11 MED ORDER — PROCHLORPERAZINE EDISYLATE 5 MG/ML IJ SOLN
INTRAMUSCULAR | Status: AC
  Filled 2016-11-11: qty 2

## 2016-11-11 MED ORDER — SODIUM CHLORIDE 0.9% FLUSH
10.0000 mL | INTRAVENOUS | Status: DC | PRN
  Administered 2016-11-11: 10 mL
  Filled 2016-11-11: qty 10

## 2016-11-11 MED ORDER — PEGFILGRASTIM 6 MG/0.6ML ~~LOC~~ PSKT
6.0000 mg | PREFILLED_SYRINGE | Freq: Once | SUBCUTANEOUS | Status: AC
  Administered 2016-11-11: 6 mg via SUBCUTANEOUS
  Filled 2016-11-11: qty 0.6

## 2016-11-11 MED ORDER — FLUCONAZOLE 100 MG PO TABS: 100.0000 mg | ORAL_TABLET | Freq: Every day | ORAL | 0 refills | Status: DC

## 2016-11-11 NOTE — Patient Instructions (Signed)
[place] Discharge Instructions for Patients Receiving Chemotherapy  Today you received the following chemotherapy agents:  Taxotere  To help prevent nausea and vomiting after your treatment, we encourage you to take your nausea medication as prescribed.   If you develop nausea and vomiting that is not controlled by your nausea medication, call the clinic.   BELOW ARE SYMPTOMS THAT SHOULD BE REPORTED IMMEDIATELY:  *FEVER GREATER THAN 100.5 F  *CHILLS WITH OR WITHOUT FEVER  NAUSEA AND VOMITING THAT IS NOT CONTROLLED WITH YOUR NAUSEA MEDICATION  *UNUSUAL SHORTNESS OF BREATH  *UNUSUAL BRUISING OR BLEEDING  TENDERNESS IN MOUTH AND THROAT WITH OR WITHOUT PRESENCE OF ULCERS  *URINARY PROBLEMS  *BOWEL PROBLEMS  UNUSUAL RASH Items with * indicate a potential emergency and should be followed up as soon as possible.  Feel free to call the clinic you have any questions or concerns. The clinic phone number is [phone].  Please show the [place] at check-in to the Emergency Department and triage nurse.   Pegfilgrastim injection What is this medicine? PEGFILGRASTIM (PEG fil gra stim) is a long-acting granulocyte colony-stimulating factor that stimulates the growth of neutrophils, a type of white blood cell important in the body's fight against infection. It is used to reduce the incidence of fever and infection in patients with certain types of cancer who are receiving chemotherapy that affects the bone marrow, and to increase survival after being exposed to high doses of radiation. This medicine may be used for other purposes; ask your health care provider or pharmacist if you have questions. COMMON BRAND NAME(S): Neulasta What should I tell my health care provider before I take this medicine? They need to know if you have any of these conditions: -kidney disease -latex allergy -ongoing radiation therapy -sickle cell disease -skin reactions to acrylic  adhesives (On-Body Injector only) -an unusual or allergic reaction to pegfilgrastim, filgrastim, other medicines, foods, dyes, or preservatives -pregnant or trying to get pregnant -breast-feeding How should I use this medicine? This medicine is for injection under the skin. If you get this medicine at home, you will be taught how to prepare and give the pre-filled syringe or how to use the On-body Injector. Refer to the patient Instructions for Use for detailed instructions. Use exactly as directed. Tell your healthcare provider immediately if you suspect that the On-body Injector may not have performed as intended or if you suspect the use of the On-body Injector resulted in a missed or partial dose. It is important that you put your used needles and syringes in a special sharps container. Do not put them in a trash can. If you do not have a sharps container, call your pharmacist or healthcare provider to get one. Talk to your pediatrician regarding the use of this medicine in children. While this drug may be prescribed for selected conditions, precautions do apply. Overdosage: If you think you have taken too much of this medicine contact a poison control center or emergency room at once. NOTE: This medicine is only for you. Do not share this medicine with others. What if I miss a dose? It is important not to miss your dose. Call your doctor or health care professional if you miss your dose. If you miss a dose due to an On-body Injector failure or leakage, a new dose should be administered as soon as possible using a single prefilled syringe for manual use. What may interact with this medicine? Interactions have not been studied. Give your  health care provider a list of all the medicines, herbs, non-prescription drugs, or dietary supplements you use. Also tell them if you smoke, drink alcohol, or use illegal drugs. Some items may interact with your medicine. This list may not describe all possible  interactions. Give your health care provider a list of all the medicines, herbs, non-prescription drugs, or dietary supplements you use. Also tell them if you smoke, drink alcohol, or use illegal drugs. Some items may interact with your medicine. What should I watch for while using this medicine? You may need blood work done while you are taking this medicine. If you are going to need a MRI, CT scan, or other procedure, tell your doctor that you are using this medicine (On-Body Injector only). What side effects may I notice from receiving this medicine? Side effects that you should report to your doctor or health care professional as soon as possible: -allergic reactions like skin rash, itching or hives, swelling of the face, lips, or tongue -dizziness -fever -pain, redness, or irritation at site where injected -pinpoint red spots on the skin -red or dark-brown urine -shortness of breath or breathing problems -stomach or side pain, or pain at the shoulder -swelling -tiredness -trouble passing urine or change in the amount of urine Side effects that usually do not require medical attention (report to your doctor or health care professional if they continue or are bothersome): -bone pain -muscle pain This list may not describe all possible side effects. Call your doctor for medical advice about side effects. You may report side effects to FDA at 1-800-FDA-1088. Where should I keep my medicine? Keep out of the reach of children. Store pre-filled syringes in a refrigerator between 2 and 8 degrees C (36 and 46 degrees F). Do not freeze. Keep in carton to protect from light. Throw away this medicine if it is left out of the refrigerator for more than 48 hours. Throw away any unused medicine after the expiration date. NOTE: This sheet is a summary. It may not cover all possible information. If you have questions about this medicine, talk to your doctor, pharmacist, or health care provider.  2018  Elsevier/Gold Standard (2016-07-23 12:58:03)

## 2016-11-12 ENCOUNTER — Telehealth: Payer: Self-pay | Admitting: *Deleted

## 2016-11-12 NOTE — Telephone Encounter (Signed)
Chemo f/u call.  Pt has no complaints of nausea/constipation/diarrhea/fever/chills etc.  Pt feeling well overall.  Thankful for call.

## 2016-11-12 NOTE — Telephone Encounter (Signed)
-----   Message from Brien Few, RN sent at 11/09/2016  4:07 PM EDT ----- Regarding: Irene Limbo chemo follow up call [place], Vincristine, Cytoxan, Etoposide

## 2016-11-17 ENCOUNTER — Other Ambulatory Visit: Payer: Self-pay | Admitting: Hematology

## 2016-11-18 ENCOUNTER — Other Ambulatory Visit: Payer: Self-pay | Admitting: *Deleted

## 2016-11-18 MED ORDER — AZITHROMYCIN 250 MG PO TABS: ORAL_TABLET | ORAL | 0 refills | Status: DC

## 2016-11-19 ENCOUNTER — Other Ambulatory Visit: Payer: Self-pay | Admitting: Nurse Practitioner

## 2016-11-19 DIAGNOSIS — B370 Candidal stomatitis: Secondary | ICD-10-CM

## 2016-11-19 DIAGNOSIS — B3781 Candidal esophagitis: Principal | ICD-10-CM

## 2016-11-20 ENCOUNTER — Other Ambulatory Visit: Payer: Self-pay | Admitting: *Deleted

## 2016-11-20 ENCOUNTER — Telehealth: Payer: Self-pay | Admitting: *Deleted

## 2016-11-20 ENCOUNTER — Other Ambulatory Visit: Payer: Self-pay | Admitting: Hematology

## 2016-11-20 ENCOUNTER — Ambulatory Visit (HOSPITAL_BASED_OUTPATIENT_CLINIC_OR_DEPARTMENT_OTHER): Payer: BC Managed Care – PPO

## 2016-11-20 DIAGNOSIS — R112 Nausea with vomiting, unspecified: Secondary | ICD-10-CM

## 2016-11-20 DIAGNOSIS — T451X5A Adverse effect of antineoplastic and immunosuppressive drugs, initial encounter: Principal | ICD-10-CM

## 2016-11-20 MED ORDER — SODIUM CHLORIDE 0.9 % IV SOLN
Freq: Once | INTRAVENOUS | Status: AC
  Administered 2016-11-20: 14:00:00 via INTRAVENOUS
  Filled 2016-11-20: qty 5

## 2016-11-20 MED ORDER — SODIUM CHLORIDE 0.9 % IV SOLN
1000.0000 mL | Freq: Once | INTRAVENOUS | Status: AC
  Administered 2016-11-20: 1000 mL via INTRAVENOUS

## 2016-11-20 MED ORDER — PANTOPRAZOLE SODIUM 40 MG IV SOLR
40.0000 mg | Freq: Once | INTRAVENOUS | Status: AC
  Administered 2016-11-20: 40 mg via INTRAVENOUS
  Filled 2016-11-20: qty 40

## 2016-11-20 NOTE — Telephone Encounter (Signed)
Pt called c/o nausea for several days now with multiple episodes of emesis daily.  Pt unable to keep down food/meds at times.  Pt states she rotating compazine and zofran with little to no relief.   Pt stated she is willing to come in today for IVF and IV antiemetics.  Per Ubaldo Glassing, RN, apt made for 2pm.  Dr. Irene Limbo notified, orders to be placed by MD.

## 2016-11-20 NOTE — Patient Instructions (Signed)
Dehydration, Adult Dehydration is when there is not enough fluid or water in your body. This happens when you lose more fluids than you take in. Dehydration can range from mild to very bad. It should be treated right away to keep it from getting very bad. Symptoms of mild dehydration may include:   Thirst.  Dry lips.  Slightly dry mouth.  Dry, warm skin.  Dizziness. Symptoms of moderate dehydration may include:   Very dry mouth.  Muscle cramps.  Dark pee (urine). Pee may be the color of tea.  Your body making less pee.  Your eyes making fewer tears.  Heartbeat that is uneven or faster than normal (palpitations).  Headache.  Light-headedness, especially when you stand up from sitting.  Fainting (syncope). Symptoms of very bad dehydration may include:   Changes in skin, such as:  Cold and clammy skin.  Blotchy (mottled) or pale skin.  Skin that does not quickly return to normal after being lightly pinched and let go (poor skin turgor).  Changes in body fluids, such as:  Feeling very thirsty.  Your eyes making fewer tears.  Not sweating when body temperature is high, such as in hot weather.  Your body making very little pee.  Changes in vital signs, such as:  Weak pulse.  Pulse that is more than 100 beats a minute when you are sitting still.  Fast breathing.  Low blood pressure.  Other changes, such as:  Sunken eyes.  Cold hands and feet.  Confusion.  Lack of energy (lethargy).  Trouble waking up from sleep.  Short-term weight loss.  Unconsciousness. Follow these instructions at home:  If told by your doctor, drink an ORS:  Make an ORS by using instructions on the package.  Start by drinking small amounts, about  cup (120 mL) every 5-10 minutes.  Slowly drink more until you have had the amount that your doctor said to have.  Drink enough clear fluid to keep your pee clear or pale yellow. If you were told to drink an ORS, finish the  ORS first, then start slowly drinking clear fluids. Drink fluids such as:  Water. Do not drink only water by itself. Doing that can make the salt (sodium) level in your body get too low (hyponatremia).  Ice chips.  Fruit juice that you have added water to (diluted).  Low-calorie sports drinks.  Avoid:  Alcohol.  Drinks that have a lot of sugar. These include high-calorie sports drinks, fruit juice that does not have water added, and soda.  Caffeine.  Foods that are greasy or have a lot of fat or sugar.  Take over-the-counter and prescription medicines only as told by your doctor.  Do not take salt tablets. Doing that can make the salt level in your body get too high (hypernatremia).  Eat foods that have minerals (electrolytes). Examples include bananas, oranges, potatoes, tomatoes, and spinach.  Keep all follow-up visits as told by your doctor. This is important. Contact a doctor if:  You have belly (abdominal) pain that:  Gets worse.  Stays in one area (localizes).  You have a rash.  You have a stiff neck.  You get angry or annoyed more easily than normal (irritability).  You are more sleepy than normal.  You have a harder time waking up than normal.  You feel:  Weak.  Dizzy.  Very thirsty.  You have peed (urinated) only a small amount of very dark pee during 6-8 hours. Get help right away if:  You   have symptoms of very bad dehydration.  You cannot drink fluids without throwing up (vomiting).  Your symptoms get worse with treatment.  You have a fever.  You have a very bad headache.  You are throwing up or having watery poop (diarrhea) and it:  Gets worse.  Does not go away.  You have blood or something green (bile) in your throw-up.  You have blood in your poop (stool). This may cause poop to look black and tarry.  You have not peed in 6-8 hours.  You pass out (faint).  Your heart rate when you are sitting still is more than 100 beats a  minute.  You have trouble breathing. This information is not intended to replace advice given to you by your health care provider. Make sure you discuss any questions you have with your health care provider. Document Released: 05/23/2009 Document Revised: 02/14/2016 Document Reviewed: 09/20/2015 Elsevier Interactive Patient Education  2017 [employer]  

## 2016-11-23 ENCOUNTER — Other Ambulatory Visit: Payer: Self-pay | Admitting: Hematology

## 2016-11-23 ENCOUNTER — Other Ambulatory Visit: Payer: Self-pay | Admitting: *Deleted

## 2016-11-23 ENCOUNTER — Ambulatory Visit (HOSPITAL_BASED_OUTPATIENT_CLINIC_OR_DEPARTMENT_OTHER): Payer: BC Managed Care – PPO

## 2016-11-23 VITALS — BP 137/66 | HR 58 | Resp 17

## 2016-11-23 DIAGNOSIS — E860 Dehydration: Secondary | ICD-10-CM

## 2016-11-23 DIAGNOSIS — C8448 Peripheral T-cell lymphoma, not classified, lymph nodes of multiple sites: Secondary | ICD-10-CM | POA: Diagnosis not present

## 2016-11-23 DIAGNOSIS — C8440 Peripheral T-cell lymphoma, not classified, unspecified site: Secondary | ICD-10-CM

## 2016-11-23 MED ORDER — ONDANSETRON HCL 4 MG/2ML IJ SOLN
INTRAMUSCULAR | Status: AC
  Filled 2016-11-23: qty 4

## 2016-11-23 MED ORDER — SODIUM CHLORIDE 0.9 % IV SOLN
Freq: Once | INTRAVENOUS | Status: AC
  Administered 2016-11-23: 16:00:00 via INTRAVENOUS

## 2016-11-23 MED ORDER — ONDANSETRON HCL 4 MG/2ML IJ SOLN
8.0000 mg | Freq: Once | INTRAMUSCULAR | Status: AC
  Administered 2016-11-23: 8 mg via INTRAVENOUS

## 2016-11-23 MED ORDER — SODIUM CHLORIDE 0.9% FLUSH
10.0000 mL | INTRAVENOUS | Status: AC | PRN
  Administered 2016-11-23: 10 mL
  Filled 2016-11-23: qty 10

## 2016-11-23 MED ORDER — HEPARIN SOD (PORK) LOCK FLUSH 100 UNIT/ML IV SOLN
500.0000 [IU] | INTRAVENOUS | Status: AC | PRN
  Administered 2016-11-23: 500 [IU]
  Filled 2016-11-23: qty 5

## 2016-11-23 MED ORDER — SODIUM CHLORIDE 0.9 % IV SOLN: Freq: Once | INTRAVENOUS | Status: DC

## 2016-11-23 NOTE — Patient Instructions (Signed)
Dehydration, Adult Dehydration is when there is not enough fluid or water in your body. This happens when you lose more fluids than you take in. Dehydration can range from mild to very bad. It should be treated right away to keep it from getting very bad. Symptoms of mild dehydration may include:   Thirst.  Dry lips.  Slightly dry mouth.  Dry, warm skin.  Dizziness. Symptoms of moderate dehydration may include:   Very dry mouth.  Muscle cramps.  Dark pee (urine). Pee may be the color of tea.  Your body making less pee.  Your eyes making fewer tears.  Heartbeat that is uneven or faster than normal (palpitations).  Headache.  Light-headedness, especially when you stand up from sitting.  Fainting (syncope). Symptoms of very bad dehydration may include:   Changes in skin, such as:  Cold and clammy skin.  Blotchy (mottled) or pale skin.  Skin that does not quickly return to normal after being lightly pinched and let go (poor skin turgor).  Changes in body fluids, such as:  Feeling very thirsty.  Your eyes making fewer tears.  Not sweating when body temperature is high, such as in hot weather.  Your body making very little pee.  Changes in vital signs, such as:  Weak pulse.  Pulse that is more than 100 beats a minute when you are sitting still.  Fast breathing.  Low blood pressure.  Other changes, such as:  Sunken eyes.  Cold hands and feet.  Confusion.  Lack of energy (lethargy).  Trouble waking up from sleep.  Short-term weight loss.  Unconsciousness. Follow these instructions at home:  If told by your doctor, drink an ORS:  Make an ORS by using instructions on the package.  Start by drinking small amounts, about  cup (120 mL) every 5-10 minutes.  Slowly drink more until you have had the amount that your doctor said to have.  Drink enough clear fluid to keep your pee clear or pale yellow. If you were told to drink an ORS, finish the  ORS first, then start slowly drinking clear fluids. Drink fluids such as:  Water. Do not drink only water by itself. Doing that can make the salt (sodium) level in your body get too low (hyponatremia).  Ice chips.  Fruit juice that you have added water to (diluted).  Low-calorie sports drinks.  Avoid:  Alcohol.  Drinks that have a lot of sugar. These include high-calorie sports drinks, fruit juice that does not have water added, and soda.  Caffeine.  Foods that are greasy or have a lot of fat or sugar.  Take over-the-counter and prescription medicines only as told by your doctor.  Do not take salt tablets. Doing that can make the salt level in your body get too high (hypernatremia).  Eat foods that have minerals (electrolytes). Examples include bananas, oranges, potatoes, tomatoes, and spinach.  Keep all follow-up visits as told by your doctor. This is important. Contact a doctor if:  You have belly (abdominal) pain that:  Gets worse.  Stays in one area (localizes).  You have a rash.  You have a stiff neck.  You get angry or annoyed more easily than normal (irritability).  You are more sleepy than normal.  You have a harder time waking up than normal.  You feel:  Weak.  Dizzy.  Very thirsty.  You have peed (urinated) only a small amount of very dark pee during 6-8 hours. Get help right away if:  You   have symptoms of very bad dehydration.  You cannot drink fluids without throwing up (vomiting).  Your symptoms get worse with treatment.  You have a fever.  You have a very bad headache.  You are throwing up or having watery poop (diarrhea) and it:  Gets worse.  Does not go away.  You have blood or something green (bile) in your throw-up.  You have blood in your poop (stool). This may cause poop to look black and tarry.  You have not peed in 6-8 hours.  You pass out (faint).  Your heart rate when you are sitting still is more than 100 beats a  minute.  You have trouble breathing. This information is not intended to replace advice given to you by your health care provider. Make sure you discuss any questions you have with your health care provider. Document Released: 05/23/2009 Document Revised: 02/14/2016 Document Reviewed: 09/20/2015 Elsevier Interactive Patient Education  2017 [employer]  

## 2016-11-24 ENCOUNTER — Encounter: Payer: Self-pay | Admitting: Hematology

## 2016-11-24 ENCOUNTER — Other Ambulatory Visit: Payer: BC Managed Care – PPO

## 2016-11-24 ENCOUNTER — Other Ambulatory Visit (HOSPITAL_COMMUNITY)
Admission: AD | Admit: 2016-11-24 | Discharge: 2016-11-24 | Disposition: A | Discharge status: Home / Self Care | Payer: BC Managed Care – PPO | Source: Ambulatory Visit | Attending: Hematology | Admitting: Hematology

## 2016-11-24 ENCOUNTER — Ambulatory Visit: Payer: BC Managed Care – PPO | Admitting: Hematology

## 2016-11-24 ENCOUNTER — Ambulatory Visit (HOSPITAL_BASED_OUTPATIENT_CLINIC_OR_DEPARTMENT_OTHER): Payer: BC Managed Care – PPO | Admitting: Hematology

## 2016-11-24 ENCOUNTER — Other Ambulatory Visit (HOSPITAL_BASED_OUTPATIENT_CLINIC_OR_DEPARTMENT_OTHER): Payer: BC Managed Care – PPO

## 2016-11-24 VITALS — BP 138/59 | HR 63 | Temp 97.7°F | Resp 18 | Ht 66.0 in | Wt 169.9 lb

## 2016-11-24 DIAGNOSIS — R630 Anorexia: Secondary | ICD-10-CM

## 2016-11-24 DIAGNOSIS — F419 Anxiety disorder, unspecified: Secondary | ICD-10-CM

## 2016-11-24 DIAGNOSIS — R530 Neoplastic (malignant) related fatigue: Secondary | ICD-10-CM

## 2016-11-24 DIAGNOSIS — C8440 Peripheral T-cell lymphoma, not classified, unspecified site: Secondary | ICD-10-CM | POA: Insufficient documentation

## 2016-11-24 DIAGNOSIS — R112 Nausea with vomiting, unspecified: Secondary | ICD-10-CM

## 2016-11-24 DIAGNOSIS — C8448 Peripheral T-cell lymphoma, not classified, lymph nodes of multiple sites: Secondary | ICD-10-CM | POA: Diagnosis not present

## 2016-11-24 LAB — CBC & DIFF AND RETIC
BASO%: 0.2 % (ref 0.0–2.0)
BASOS ABS: 0 10*3/uL (ref 0.0–0.1)
EOS ABS: 0 10*3/uL (ref 0.0–0.5)
EOS%: 0 % (ref 0.0–7.0)
HEMATOCRIT: 37.4 % (ref 34.8–46.6)
HGB: 12.7 g/dL (ref 11.6–15.9)
IMMATURE RETIC FRACT: 15.1 % — AB (ref 1.60–10.00)
LYMPH%: 10.3 % — AB (ref 14.0–49.7)
MCH: 25.1 pg (ref 25.1–34.0)
MCHC: 34 g/dL (ref 31.5–36.0)
MCV: 73.9 fL — ABNORMAL LOW (ref 79.5–101.0)
MONO#: 0.6 10*3/uL (ref 0.1–0.9)
MONO%: 4.6 % (ref 0.0–14.0)
NEUT#: 10.8 10*3/uL — ABNORMAL HIGH (ref 1.5–6.5)
NEUT%: 84.9 % — AB (ref 38.4–76.8)
PLATELETS: 191 10*3/uL (ref 145–400)
RBC: 5.06 10*6/uL (ref 3.70–5.45)
RDW: 15 % — ABNORMAL HIGH (ref 11.2–14.5)
RETIC %: 0.55 % — AB (ref 0.70–2.10)
Retic Ct Abs: 27.83 10*3/uL — ABNORMAL LOW (ref 33.70–90.70)
WBC: 12.7 10*3/uL — ABNORMAL HIGH (ref 3.9–10.3)
lymph#: 1.3 10*3/uL (ref 0.9–3.3)
nRBC: 0 % (ref 0–0)

## 2016-11-24 LAB — COMPREHENSIVE METABOLIC PANEL
ALT: 20 U/L (ref 0–55)
ANION GAP: 10 meq/L (ref 3–11)
AST: 15 U/L (ref 5–34)
Albumin: 3.2 g/dL — ABNORMAL LOW (ref 3.5–5.0)
Alkaline Phosphatase: 86 U/L (ref 40–150)
BUN: 15.4 mg/dL (ref 7.0–26.0)
CALCIUM: 8.7 mg/dL (ref 8.4–10.4)
CO2: 25 meq/L (ref 22–29)
CREATININE: 0.7 mg/dL (ref 0.6–1.1)
Chloride: 102 mEq/L (ref 98–109)
EGFR: >90 mL/min/{1.73_m2} (ref >90)
Glucose: 118 mg/dl (ref 70–140)
Potassium: 3.9 mEq/L (ref 3.5–5.1)
Sodium: 137 mEq/L (ref 136–145)
TOTAL PROTEIN: 6 g/dL — AB (ref 6.4–8.3)

## 2016-11-24 LAB — LACTATE DEHYDROGENASE: LDH: 313 U/L — AB (ref 125–245)

## 2016-11-24 LAB — URIC ACID: URIC ACID, SERUM: 2.5 mg/dL — AB (ref 2.6–7.4)

## 2016-11-24 LAB — PHOSPHORUS: Phosphorus: 2.7 mg/dL (ref 2.5–4.6)

## 2016-11-24 NOTE — Progress Notes (Signed)
Marland Kitchen    HEMATOLOGY/ONCOLOGY CLINIC NOTE  Date of Service: .11/24/2016  Patient Care Team: Denita Lung, MD as PCP - General (Family Medicine)  CHIEF COMPLAINTS/PURPOSE OF CONSULTATION:  f/u for PTCL NOS  HISTORY OF PRESENTING ILLNESS:  plz see previous note for HPI  INTERVAL HISTORY  Denise [name] is here with her partner for her scheduled follow-up for a toxicity check. She notes that her lymph nodes in the neck are already somewhat smaller. She notes that her cough has resolved and breathing feels better as well. Has some issues with grade 2 nausea and vomiting about 4-5 days after chemotherapy. Needed additional IV fluids on a couple of days as well as IV Emend. No nausea today. Is eating better. Has had a history of significant motion sickness and seasickness at baseline which seems to accentuated her need for antiemetics. In good spirits with no other new acute concerns. We discussed the possible role and consideration of intrathecal methotrexate for CNS prophylaxis. She has an appointment with Dr. Dellis Filbert at [facility] for second opinion on 12/01/2016.  MEDICAL HISTORY:  Past Medical History:  Diagnosis Date  . Allergy   . Asthma   . Bursitis of left hip   . Fibroids    TAH in 1999  . GERD (gastroesophageal reflux disease)   . PVC's (premature ventricular contractions)   . Sleep apnea     SURGICAL HISTORY: Past Surgical History:  Procedure Laterality Date  . CARDIAC CATH  05/2007   J. BERRY  . HAMMER TOE SURGERY Bilateral 2016  . IR GENERIC HISTORICAL  11/05/2016   IR US GUIDE VASC ACCESS RIGHT 11/05/2016 [name], MD [facility]  . IR GENERIC HISTORICAL  11/05/2016   IR FLUORO GUIDE PORT INSERTION RIGHT 11/05/2016 [name], MD [facility]  . TOTAL ABDOMINAL HYSTERECTOMY  05/15/1998   secondary to fibroids  . WISDOM TOOTH EXTRACTION  mid teens    SOCIAL HISTORY: Social History   Social History  . Marital status: Significant Other    Spouse name:  N/A  . Number of children: 0  . Years of education: N/A   Occupational History  .  [place] History Main Topics  . Smoking status: Never Smoker  . Smokeless tobacco: Never Used  . Alcohol use 0.6 - 1.2 oz/week    1 - 2 Standard drinks or equivalent per week     Comment: only if out to dinner, maybe once a month  . Drug use: No  . Sexual activity: Yes    Partners: Female    Birth control/ protection: None   Other Topics Concern  . Not on file   Social History Narrative  . No narrative on file    FAMILY HISTORY: Family History  Problem Relation Age of Onset  . Cancer Maternal Grandmother     GI cancer in 35's  . Dementia Mother   . Heart disease Father   . Heart failure Father     ALLERGIES:  is allergic to morphine and related.  MEDICATIONS:  Current Outpatient Prescriptions  Medication Sig Dispense Refill  . acyclovir (ZOVIRAX) 400 MG tablet Take 1 tablet (400 mg total) by mouth 2 (two) times daily. 60 tablet 2  . ADVAIR DISKUS 100-50 MCG/DOSE AEPB Inhale 1 puff into the lungs daily.     Marland Kitchen albuterol (PROVENTIL HFA;VENTOLIN HFA) 108 (90 Base) MCG/ACT inhaler Inhale 2 puffs into the lungs every 6 (six) hours as needed for wheezing or  shortness of breath. 1 Inhaler 0  . allopurinol (ZYLOPRIM) 300 MG tablet Take 1 tablet (300 mg total) by mouth daily. 30 tablet 3  . ALPRAZolam (XANAX) 0.25 MG tablet TAKE 1 TABLET BY MOUTH TWICE A DAY AS NEEDED FOR ANXIETY 50 tablet 0  . aspirin 81 MG chewable tablet Chew 81 mg by mouth daily.    Marland Kitchen atenolol (TENORMIN) 50 MG tablet Take 1 tablet by mouth 2 (two) times daily.  0  . azithromycin (ZITHROMAX Z-PAK) 250 MG tablet Take 2 tablets (500mg ) by mouth day 1, then take 1 tablet (250mg ) by mouth day 2-5. 6 each 0  . benzonatate (TESSALON) 100 MG capsule TAKE 1 CAPSULE(100 MG) BY MOUTH THREE TIMES DAILY AS NEEDED FOR COUGH 30 capsule 0  . dexamethasone (DECADRON) 4 MG tablet Take 1 tablet (4 mg total) by mouth daily  with lunch. 30 tablet 0  . DULoxetine (CYMBALTA) 30 MG capsule Take 1 capsule (30 mg total) by mouth daily. 30 capsule 3  . esomeprazole (NEXIUM) 40 MG capsule Take 1 capsule (40 mg total) by mouth daily before breakfast. 100 capsule 0  . fluconazole (DIFLUCAN) 100 MG tablet TAKE 1 TABLET BY MOUTH EVERY DAY 14 tablet 0  . fluticasone (FLONASE) 50 MCG/ACT nasal spray Place 2 sprays into both nostrils daily. 16 g 11  . hydrochlorothiazide (MICROZIDE) 12.5 MG capsule TAKE ONE CAPSULE BY MOUTH EVERY DAY 30 capsule 11  . lidocaine-prilocaine (EMLA) cream Apply to affected area once 30 g 3  . meloxicam (MOBIC) 15 MG tablet TAKE 1 TABLET BY MOUTH EVERY DAY 30 tablet 2  . nystatin (MYCOSTATIN) 100000 UNIT/ML suspension Take 5 mLs (500,000 Units total) by mouth 4 (four) times daily as needed (thrush). 100 mL 2  . ondansetron (ZOFRAN ODT) 8 MG disintegrating tablet Take 1 tablet (8 mg total) by mouth every 8 (eight) hours as needed for nausea or vomiting. 20 tablet 0  . ondansetron (ZOFRAN) 8 MG tablet Take 1 tablet (8 mg total) by mouth 2 (two) times daily as needed for refractory nausea / vomiting. 30 tablet 1  . predniSONE (DELTASONE) 20 MG tablet Take 4 tablets (80 mg total) with breakfast. Take days 1-5 with chemotherapy. Disregard previous prescription for this patient. Correct Rx. 20 tablet 0  . prochlorperazine (COMPAZINE) 10 MG tablet Take 1 tablet (10 mg total) by mouth every 6 (six) hours as needed (Nausea or vomiting). 30 tablet 6  . triazolam (HALCION) 0.25 MG tablet TAKE 1 TABLET BY MOUTH AT BEDTIME AS NEEDED FOR SLEEP (MAX 30/75 DAYS PER INS) 30 tablet 0   No current facility-administered medications for this visit.     REVIEW OF SYSTEMS:    10 Point review of Systems was done is negative except as noted above.  PHYSICAL EXAMINATION: ECOG PERFORMANCE STATUS: 1 - Symptomatic but completely ambulatory  . Vitals:   11/24/16 1248  BP: (!) 138/59  Pulse: 63  Resp: 18  Temp: 97.7 F  (36.5 C)   Filed Weights   11/24/16 1248  Weight: 169 lb 14.4 oz (77.1 kg)   .Body mass index is 27.42 kg/m.  GENERAL:alert, in no acute distress and comfortable SKIN: skin color, texture, turgor are normal, no rashes or significant lesions EYES: normal, conjunctiva are pink and non-injected, sclera clear OROPHARYNX:no exudate, no erythema and lips, buccal mucosa, and tongue normal  NECK: supple, no JVD,No inspiratory stridor, no facial swelling or upper extremity swelling. LYMPH: Extensive bilateral supraclavicular lymphadenopathy, no palpable inguinal or axillary lymphadenopathy  noted. LUNGS: clear to auscultation with normal respiratory effort HEART: regular rate & rhythm,  no murmurs and no lower extremity edema ABDOMEN: abdomen soft, non-tender, normoactive bowel sounds  Musculoskeletal: no cyanosis of digits and no clubbing  PSYCH: alert & oriented x 3 with fluent speech NEURO: no focal motor/sensory deficits  LABORATORY DATA:  I have reviewed the data as listed  . CBC Latest Ref Rng & Units 11/24/2016 11/05/2016 11/02/2016  WBC 3.9 - 10.3 10e3/uL 12.7(H) 6.6 5.7  Hemoglobin 11.6 - 15.9 g/dL 12.7 13.2 13.1  Hematocrit 34.8 - 46.6 % 37.4 39.2 39.4  Platelets 145 - 400 10e3/uL 191 216 241    CMP Latest Ref Rng & Units 11/24/2016 11/02/2016 10/16/2016  Glucose 70 - 140 mg/dl 118 106 109  BUN 7.0 - 26.0 mg/dL 15.4 10.9 8.3  Creatinine 0.6 - 1.1 mg/dL 0.7 0.7 0.7  Sodium 136 - 145 mEq/L 137 136 133(L)  Potassium 3.5 - 5.1 mEq/L 3.9 3.6 3.4(L)  Chloride 98 - 110 mmol/L - - -  CO2 22 - 29 mEq/L 25 25 26   Calcium 8.4 - 10.4 mg/dL 8.7 9.1 9.3  Total Protein 6.4 - 8.3 g/dL 6.0(L) 6.7 7.1  Total Bilirubin 0.20 - 1.20 mg/dL <0.22 0.49 0.46  Alkaline Phos 40 - 150 U/L 86 56 82  AST 5 - 34 U/L 15 17 21   ALT 0 - 55 U/L 20 13 12    . Component     Latest Ref Rng & Units 10/16/2016  Uric Acid, Serum     2.6 - 7.4 mg/dl 6.4  Phosphorus     2.5 - 4.5 mg/dL 3.6  LDH     125 - 245 U/L  247 (H)  Hep C Virus Ab     0.0 - 0.9 s/co ratio <0.1  Hepatitis B Surface Ag     Negative Negative  Hep B Core Ab, Tot     Negative Negative  HIV     Non Reactive Non Reactive        RADIOGRAPHIC STUDIES: I have personally reviewed the radiological images as listed and agreed with the findings in the report. Nm Pet Image Initial (pi) Skull Base To Thigh  Result Date: 10/27/2016 CLINICAL DATA:  Initial treatment strategy for lymphoma. EXAM: NUCLEAR MEDICINE PET SKULL BASE TO THIGH TECHNIQUE: 9.1 mCi F-18 FDG was injected intravenously. Full-ring PET imaging was performed from the skull base to thigh after the radiotracer. CT data was obtained and used for attenuation correction and anatomic localization. FASTING BLOOD GLUCOSE:  Value: 137 mg/dl COMPARISON:  Chest CT on 10/15/2016 FINDINGS: NECK Diffuse hypermetabolic lymphadenopathy is seen throughout the jugular chains, posterior triangles, and supraclavicular regions bilaterally. Index lymph node mass in the left lower jugular and supraclavicular region measures 3.5 cm on image 38/4, with SUV max of 22.3. CHEST Hypermetabolic lymphadenopathy is seen throughout the mediastinum, bilateral hilar regions, bilateral internal mammary chains, right subpectoral and axillary regions, and bilateral retrocrural regions. Index nodal mass in the high right paratracheal region measures 3.9 cm on image 53/4 with SUV max of 22.3. Ill-defined rounded masslike opacity in central right lung perihilar region with multiple adjacent satellite nodules is hypermetabolic. This measures 3.4 cm in the central right lower lobe on image 29/7, and is hypermetabolic with SUV max of 53.9. Increased size of moderate bilateral pleural effusions since previous study, however the hypermetabolic pleural masses are seen. ABDOMEN/PELVIS No abnormal hypermetabolic activity within the liver, pancreas, adrenal glands, or spleen. No hypermetabolic lymph nodes  in the abdomen or pelvis.  Bulky abdominal hypermetabolic lymphadenopathy is seen in the gastrohepatic ligament, porta hepatis, small bowel mesentery, and left paraaortic and aortocaval spaces. Index lymphadenopathy in the left para-aortic region measures 3.8 cm on image 111/4, with SUV max of 21.4. A 10 mm hypermetabolic right external iliac lymph node is seen on image 155/4, with SUV max of 4.5. Mild ascites noted within the abdomen and pelvis. Prior hysterectomy. SKELETON No focal hypermetabolic activity to suggest skeletal metastasis. IMPRESSION: Bulky hypermetabolic lymphadenopathy throughout the neck, chest, and abdomen. 1 cm hypermetabolic pelvic lymph node in right external iliac chain. Hypermetabolic masslike opacity in central right lung with multiple adjacent hypermetabolic nodules. This is suspicious for pulmonary involvement by lymphoma, although differential diagnosis also includes infectious and inflammatory etiologies. Increased moderate bilateral pleural effusions. No hypermetabolic pleural masses identified. Electronically Signed   By: Earle Gell M.D.   On: 10/27/2016 10:22   Ir US Guide Vasc Access Right  Result Date: 11/05/2016 CLINICAL DATA:  Lymphoma EXAM: TUNNEL POWER PORT PLACEMENT WITH SUBCUTANEOUS POCKET UTILIZING ULTRASOUND & FLOUROSCOPY FLUOROSCOPY TIME:  36 seconds.  Twelve mGy. MEDICATIONS AND MEDICAL HISTORY: Versed 4 mg, Fentanyl 200 mcg. Additional Medications: ANCEF 2 G. Antibiotics were given within 2 hours of the procedure. ANESTHESIA/SEDATION: Moderate sedation time: 28 minutes. Nursing monitored the the patient during the procedure. PROCEDURE: After written informed consent was obtained, patient was placed in the supine position on angiographic table. The right neck and chest was prepped and draped in a sterile fashion. Lidocaine was utilized for local anesthesia. The right jugular vein was noted to be patent initially with ultrasound. Under sonographic guidance, a micropuncture needle was inserted  into the right IJ vein (Ultrasound and fluoroscopic image documentation was performed). The needle was removed over an 018 wire which was exchanged for a Amplatz. This was advanced into the IVC. An 8-French dilator was advanced over the Amplatz. A small incision was made in the right upper chest over the anterior right second rib. Utilizing blunt dissection, a subcutaneous pocket was created in the caudal direction. The pocket was irrigated with a copious amount of sterile normal saline. The port catheter was tunneled from the chest incision, and out the neck incision. The reservoir was inserted into the subcutaneous pocket and secured with two 3-0 Ethilon stitches. A peel-away sheath was advanced over the Amplatz wire. The port catheter was cut to measure length and inserted through the peel-away sheath. The peel-away sheath was removed. The chest incision was closed with 3-0 Vicryl interrupted stitches for the subcutaneous tissue and a running of 4-0 Vicryl subcuticular stitch for the skin. The neck incision was closed with a 4-0 Vicryl subcuticular stitch. Derma-bond was applied to both surgical incisions. The port reservoir was flushed and instilled with heparinized saline. No complications. FINDINGS: A right IJ vein Port-A-Cath is in place with its tip at the cavoatrial junction. COMPLICATIONS: None IMPRESSION: Successful 8 French right internal jugular vein power port placement with its tip at the SVC/RA junction. Electronically Signed   By: [name] M.D.   On: 11/05/2016 16:11   Ir Fluoro Guide Port Insertion Right  Result Date: 11/05/2016 CLINICAL DATA:  Lymphoma EXAM: TUNNEL POWER PORT PLACEMENT WITH SUBCUTANEOUS POCKET UTILIZING ULTRASOUND & FLOUROSCOPY FLUOROSCOPY TIME:  36 seconds.  Twelve mGy. MEDICATIONS AND MEDICAL HISTORY: Versed 4 mg, Fentanyl 200 mcg. Additional Medications: ANCEF 2 G. Antibiotics were given within 2 hours of the procedure. ANESTHESIA/SEDATION: Moderate sedation time: 28  minutes. Nursing monitored the  the patient during the procedure. PROCEDURE: After written informed consent was obtained, patient was placed in the supine position on angiographic table. The right neck and chest was prepped and draped in a sterile fashion. Lidocaine was utilized for local anesthesia. The right jugular vein was noted to be patent initially with ultrasound. Under sonographic guidance, a micropuncture needle was inserted into the right IJ vein (Ultrasound and fluoroscopic image documentation was performed). The needle was removed over an 018 wire which was exchanged for a Amplatz. This was advanced into the IVC. An 8-French dilator was advanced over the Amplatz. A small incision was made in the right upper chest over the anterior right second rib. Utilizing blunt dissection, a subcutaneous pocket was created in the caudal direction. The pocket was irrigated with a copious amount of sterile normal saline. The port catheter was tunneled from the chest incision, and out the neck incision. The reservoir was inserted into the subcutaneous pocket and secured with two 3-0 Ethilon stitches. A peel-away sheath was advanced over the Amplatz wire. The port catheter was cut to measure length and inserted through the peel-away sheath. The peel-away sheath was removed. The chest incision was closed with 3-0 Vicryl interrupted stitches for the subcutaneous tissue and a running of 4-0 Vicryl subcuticular stitch for the skin. The neck incision was closed with a 4-0 Vicryl subcuticular stitch. Derma-bond was applied to both surgical incisions. The port reservoir was flushed and instilled with heparinized saline. No complications. FINDINGS: A right IJ vein Port-A-Cath is in place with its tip at the cavoatrial junction. COMPLICATIONS: None IMPRESSION: Successful 8 French right internal jugular vein power port placement with its tip at the SVC/RA junction. Electronically Signed   By: [name] M.D.   On: 11/05/2016  16:11    ASSESSMENT & PLAN:   61 year old female with  #1 Newly diagnosed Stage IVBE Peripheral T cell lymphoma NOS  PET/CT scan shows bulky hypermetabolic lymphadenopathy throughout the neck, chest, and abdomen. 1 cm hypermetabolic pelvic lymph node in right external iliac chain. Hypermetabolic masslike opacity in central right lung with multiple adjacent hypermetabolic nodules. This is suspicious for pulmonary involvement by lymphoma  Fatigue anorexia or night sweats-drenching, and weight loss suggest constitutional type B symptoms.  Hepatitis profile and HIV neg  Patient is s/p 1st cycle of CHOEP tolerated well overall Grade 1 fatigue Grade 2 nausea/vomiting and some dehydration needing IV anti-emetics and IVF Plan -labs are stable and patient appears to have recovered her counts -I have added Emend D1 and additional aloxi D3 from her 2nd cycle to optimize anti-emetics since patient is particular sensitive to nausea from Ctx -has prn zofran and compazine at home -will add scopolamine patch if needed for persistent delayed nausea -added IVF to D3 of treatment -encourage additional po fluid -discussed consideration of IT MTX for CNS prophylaxis -has appointment with Dr Dellis Filbert at [facility] on 12/01/2016 for a second opinion. -will plan to discontinue Allopurinol if labs stable next week and no signs of TLS   #3 history of PVCs -On chronic beta blocker therapy. -In the setting of asthma with her primary care physician consider transitioning to a calcium channel blocker if appropriate.  #4 history of sleep apnea has been noncompliant with her CPAP use. She does have a CPAP machine after counseling has been using this regularly  #5 severe anxiety - this appears somewhat improved -Has been using Xanax when necessary.  -continue on Cymbalta 30 mg by mouth daily. -recommended staying as physically  active as possible.  #6 persistent dry cough likely due to airway irritation from  mediastinal adenopathy.  Appears to have resolved with starting treatment.   Continue treatment as per orders next cycle on 11/30/2016 RTC with Dr Irene Limbo on 11/30/2016 with labs  All of the patients questions were answered with apparent satisfaction. The patient knows to call the clinic with any problems, questions or concerns.  I spent 25 minutes counseling the patient face to face. The total time spent in the appointment was 30 minutes and more than 50% was on counseling and direct patient cares.    Sullivan Lone MD [place] AAHIVMS [facility] Hematology/Oncology Physician [facility]  (Office):       [phone] (Work cell):  [phone] (Fax):           [phone]  11/24/2016 12:51 PM

## 2016-11-25 ENCOUNTER — Other Ambulatory Visit: Payer: Self-pay | Admitting: Family Medicine

## 2016-11-25 ENCOUNTER — Other Ambulatory Visit: Payer: Self-pay | Admitting: Hematology

## 2016-11-25 ENCOUNTER — Ambulatory Visit: Payer: BC Managed Care – PPO | Admitting: Nurse Practitioner

## 2016-11-27 ENCOUNTER — Other Ambulatory Visit: Payer: Self-pay | Admitting: Hematology

## 2016-11-27 ENCOUNTER — Ambulatory Visit: Payer: BC Managed Care – PPO | Admitting: Nurse Practitioner

## 2016-11-27 ENCOUNTER — Telehealth: Payer: Self-pay | Admitting: *Deleted

## 2016-11-27 NOTE — Telephone Encounter (Signed)
Call from pt reporting she is scheduled for chemo at 0730 4/23-4/25. She is unable to keep 4/24 chemo appt as currently scheduled. She has an appt at [facility] Tues AM. Pt voiced frustration that appointments were not scheduled last week. Informed her the infusion room is at capacity for those days. Will work with infusion RN supervisor to see if 4/24 appointment can be adjusted to accommodate the [facility] referral. She voiced understanding.  Per Ubaldo Glassing, [place] to move 4/24 appt to late afternoon. Scheduler is awaiting decision from MD regarding office visit appointment.

## 2016-11-30 ENCOUNTER — Other Ambulatory Visit (HOSPITAL_BASED_OUTPATIENT_CLINIC_OR_DEPARTMENT_OTHER): Payer: BC Managed Care – PPO

## 2016-11-30 ENCOUNTER — Ambulatory Visit: Payer: BC Managed Care – PPO | Admitting: Nutrition

## 2016-11-30 ENCOUNTER — Ambulatory Visit (HOSPITAL_BASED_OUTPATIENT_CLINIC_OR_DEPARTMENT_OTHER): Payer: BC Managed Care – PPO

## 2016-11-30 VITALS — BP 135/77 | HR 58 | Temp 97.6°F | Resp 18

## 2016-11-30 DIAGNOSIS — C8448 Peripheral T-cell lymphoma, not classified, lymph nodes of multiple sites: Secondary | ICD-10-CM

## 2016-11-30 DIAGNOSIS — Z5111 Encounter for antineoplastic chemotherapy: Secondary | ICD-10-CM

## 2016-11-30 LAB — CBC & DIFF AND RETIC
BASO%: 0.2 % (ref 0.0–2.0)
Basophils Absolute: 0 10*3/uL (ref 0.0–0.1)
EOS%: 0 % (ref 0.0–7.0)
Eosinophils Absolute: 0 10*3/uL (ref 0.0–0.5)
HEMATOCRIT: 35.4 % (ref 34.8–46.6)
HGB: 11.9 g/dL (ref 11.6–15.9)
IMMATURE RETIC FRACT: 7.7 % (ref 1.60–10.00)
LYMPH#: 0.5 10*3/uL — AB (ref 0.9–3.3)
LYMPH%: 4.2 % — AB (ref 14.0–49.7)
MCH: 25.3 pg (ref 25.1–34.0)
MCHC: 33.6 g/dL (ref 31.5–36.0)
MCV: 75.3 fL — ABNORMAL LOW (ref 79.5–101.0)
MONO#: 1.1 10*3/uL — AB (ref 0.1–0.9)
MONO%: 9.9 % (ref 0.0–14.0)
NEUT#: 9.1 10*3/uL — ABNORMAL HIGH (ref 1.5–6.5)
NEUT%: 85.7 % — AB (ref 38.4–76.8)
PLATELETS: 278 10*3/uL (ref 145–400)
RBC: 4.7 10*6/uL (ref 3.70–5.45)
RDW: 15.2 % — ABNORMAL HIGH (ref 11.2–14.5)
RETIC CT ABS: 62.51 10*3/uL (ref 33.70–90.70)
Retic %: 1.33 % (ref 0.70–2.10)
WBC: 10.7 10*3/uL — ABNORMAL HIGH (ref 3.9–10.3)

## 2016-11-30 LAB — COMPREHENSIVE METABOLIC PANEL
ALBUMIN: 3.2 g/dL — AB (ref 3.5–5.0)
ALK PHOS: 81 U/L (ref 40–150)
ALT: 35 U/L (ref 0–55)
ANION GAP: 12 meq/L — AB (ref 3–11)
AST: 29 U/L (ref 5–34)
BUN: 15.1 mg/dL (ref 7.0–26.0)
CALCIUM: 8.6 mg/dL (ref 8.4–10.4)
CO2: 26 mEq/L (ref 22–29)
CREATININE: 0.6 mg/dL (ref 0.6–1.1)
Chloride: 99 mEq/L (ref 98–109)
Glucose: 130 mg/dl (ref 70–140)
Potassium: 3.9 mEq/L (ref 3.5–5.1)
Sodium: 137 mEq/L (ref 136–145)
TOTAL PROTEIN: 6 g/dL — AB (ref 6.4–8.3)

## 2016-11-30 MED ORDER — VINCRISTINE SULFATE CHEMO INJECTION 1 MG/ML
2.0000 mg | Freq: Once | INTRAVENOUS | Status: AC
  Administered 2016-11-30: 2 mg via INTRAVENOUS
  Filled 2016-11-30: qty 2

## 2016-11-30 MED ORDER — SODIUM CHLORIDE 0.9 % IV SOLN
100.0000 mg/m2 | Freq: Once | INTRAVENOUS | Status: AC
  Administered 2016-11-30: 200 mg via INTRAVENOUS
  Filled 2016-11-30: qty 10

## 2016-11-30 MED ORDER — HEPARIN SOD (PORK) LOCK FLUSH 100 UNIT/ML IV SOLN
500.0000 [IU] | Freq: Once | INTRAVENOUS | Status: AC | PRN
  Administered 2016-11-30: 500 [IU]
  Filled 2016-11-30: qty 5

## 2016-11-30 MED ORDER — PALONOSETRON HCL INJECTION 0.25 MG/5ML
INTRAVENOUS | Status: AC
  Filled 2016-11-30: qty 5

## 2016-11-30 MED ORDER — DOXORUBICIN HCL CHEMO IV INJECTION 2 MG/ML
50.0000 mg/m2 | Freq: Once | INTRAVENOUS | Status: AC
  Administered 2016-11-30: 100 mg via INTRAVENOUS
  Filled 2016-11-30: qty 50

## 2016-11-30 MED ORDER — PALONOSETRON HCL INJECTION 0.25 MG/5ML
0.2500 mg | Freq: Once | INTRAVENOUS | Status: AC
  Administered 2016-11-30: 0.25 mg via INTRAVENOUS

## 2016-11-30 MED ORDER — SODIUM CHLORIDE 0.9 % IV SOLN
750.0000 mg/m2 | Freq: Once | INTRAVENOUS | Status: AC
  Administered 2016-11-30: 1500 mg via INTRAVENOUS
  Filled 2016-11-30: qty 75

## 2016-11-30 MED ORDER — SODIUM CHLORIDE 0.9% FLUSH
10.0000 mL | INTRAVENOUS | Status: DC | PRN
  Administered 2016-11-30: 10 mL
  Filled 2016-11-30: qty 10

## 2016-11-30 MED ORDER — SODIUM CHLORIDE 0.9 % IV SOLN
Freq: Once | INTRAVENOUS | Status: AC
  Administered 2016-11-30: 09:00:00 via INTRAVENOUS
  Filled 2016-11-30: qty 5

## 2016-11-30 MED ORDER — SODIUM CHLORIDE 0.9 % IV SOLN
Freq: Once | INTRAVENOUS | Status: AC
  Administered 2016-11-30: 08:00:00 via INTRAVENOUS

## 2016-11-30 NOTE — Progress Notes (Signed)
61 year old female diagnosed with lymphoma receiving chemotherapy.  She is a patient of Dr. Irene Limbo.  Past medical history includes GERD.  Medications include Xanax, Decadron, Nexium, Zofran, prednisone, Compazine.  Labs include albumin 3.2.  Height: 66 inches. Weight: 169.9 pounds April 17. Usual body weight: 190 pounds March 15. BMI: 27.42.  Patient reports she had nausea approximately 5 days after her last chemotherapy. She states the IV nausea medications work well but her oral pills do not control nausea. She does have constipation. Reports she has noted loss of muscle mass and endorses approximately 20 pound weight loss over one month. This is 11% over one month and is significant.  Nutrition diagnosis:  Severe malnutrition in the context of acute illness secondary to greater than 5% weight loss over one month and moderate depletion of both body fat and muscle mass.  Intervention: Patient was educated to take nausea medications as prescribed.  Educated patient on following bland diet during times of nausea and encouraged small frequent meals and snacks. Provided education on improving constipation. Recommended prune juice every day since patient enjoys this. Also encouraged high-calorie, high-protein foods to promote weight maintenance. Fact sheets were provided.  Questions were answered.  Teach back method used.  Monitoring, evaluation, goals:  Patient will tolerate adequate calories and protein to promote weight maintenance.  Next visit: To be scheduled as needed.  **Disclaimer: This note was dictated with voice recognition software. Similar sounding words can inadvertently be transcribed and this note may contain transcription errors which may not have been corrected upon publication of note.**

## 2016-11-30 NOTE — Patient Instructions (Signed)
[facility] Discharge Instructions for Patients Receiving Chemotherapy  Today you received the following chemotherapy agents: Adriamycin, Vincristine, Cytoxan and Etoposide.  To help prevent nausea and vomiting after your treatment, we encourage you to take your nausea medication: Compazine. Take one every 6 hours as needed.  If you develop nausea and vomiting that is not controlled by your nausea medication, call the clinic.   BELOW ARE SYMPTOMS THAT SHOULD BE REPORTED IMMEDIATELY:  *FEVER GREATER THAN 100.5 F  *CHILLS WITH OR WITHOUT FEVER  NAUSEA AND VOMITING THAT IS NOT CONTROLLED WITH YOUR NAUSEA MEDICATION  *UNUSUAL SHORTNESS OF BREATH  *UNUSUAL BRUISING OR BLEEDING  TENDERNESS IN MOUTH AND THROAT WITH OR WITHOUT PRESENCE OF ULCERS  *URINARY PROBLEMS  *BOWEL PROBLEMS  UNUSUAL RASH Items with * indicate a potential emergency and should be followed up as soon as possible.  Feel free to call the clinic should you have any questions or concerns. The clinic phone number is [phone].  Please show the [place] at check-in to the Emergency Department and triage nurse.

## 2016-12-01 ENCOUNTER — Ambulatory Visit (HOSPITAL_BASED_OUTPATIENT_CLINIC_OR_DEPARTMENT_OTHER): Payer: BC Managed Care – PPO

## 2016-12-01 VITALS — BP 128/81 | HR 59 | Temp 98.3°F | Resp 16

## 2016-12-01 DIAGNOSIS — Z5111 Encounter for antineoplastic chemotherapy: Secondary | ICD-10-CM

## 2016-12-01 DIAGNOSIS — C8448 Peripheral T-cell lymphoma, not classified, lymph nodes of multiple sites: Secondary | ICD-10-CM | POA: Diagnosis not present

## 2016-12-01 MED ORDER — SODIUM CHLORIDE 0.9 % IV SOLN
Freq: Once | INTRAVENOUS | Status: AC
  Administered 2016-12-01: 15:00:00 via INTRAVENOUS

## 2016-12-01 MED ORDER — HEPARIN SOD (PORK) LOCK FLUSH 100 UNIT/ML IV SOLN
500.0000 [IU] | Freq: Once | INTRAVENOUS | Status: AC | PRN
  Administered 2016-12-01: 500 [IU]
  Filled 2016-12-01: qty 5

## 2016-12-01 MED ORDER — PROCHLORPERAZINE EDISYLATE 5 MG/ML IJ SOLN
10.0000 mg | Freq: Once | INTRAMUSCULAR | Status: AC
  Administered 2016-12-01: 10 mg via INTRAVENOUS

## 2016-12-01 MED ORDER — SODIUM CHLORIDE 0.9 % IV SOLN
100.0000 mg/m2 | Freq: Once | INTRAVENOUS | Status: AC
  Administered 2016-12-01: 200 mg via INTRAVENOUS
  Filled 2016-12-01: qty 10

## 2016-12-01 MED ORDER — PROCHLORPERAZINE EDISYLATE 5 MG/ML IJ SOLN
INTRAMUSCULAR | Status: AC
  Filled 2016-12-01: qty 2

## 2016-12-01 MED ORDER — SODIUM CHLORIDE 0.9% FLUSH
10.0000 mL | INTRAVENOUS | Status: DC | PRN
  Administered 2016-12-01: 10 mL
  Filled 2016-12-01: qty 10

## 2016-12-01 NOTE — Patient Instructions (Signed)
[facility] Discharge Instructions for Patients Receiving Chemotherapy  Today you received the following chemotherapy agents: Etoposide.  To help prevent nausea and vomiting after your treatment, we encourage you to take your nausea medication: Compazine. Take one every 6 hours as needed.   If you develop nausea and vomiting that is not controlled by your nausea medication, call the clinic.   BELOW ARE SYMPTOMS THAT SHOULD BE REPORTED IMMEDIATELY:  *FEVER GREATER THAN 100.5 F  *CHILLS WITH OR WITHOUT FEVER  NAUSEA AND VOMITING THAT IS NOT CONTROLLED WITH YOUR NAUSEA MEDICATION  *UNUSUAL SHORTNESS OF BREATH  *UNUSUAL BRUISING OR BLEEDING  TENDERNESS IN MOUTH AND THROAT WITH OR WITHOUT PRESENCE OF ULCERS  *URINARY PROBLEMS  *BOWEL PROBLEMS  UNUSUAL RASH Items with * indicate a potential emergency and should be followed up as soon as possible.  Feel free to call the clinic should you have any questions or concerns. The clinic phone number is [phone].  Please show the CHEMO ALERT CARD at check-in to the Emergency Department and triage nurse.   

## 2016-12-02 ENCOUNTER — Ambulatory Visit (HOSPITAL_BASED_OUTPATIENT_CLINIC_OR_DEPARTMENT_OTHER): Payer: BC Managed Care – PPO

## 2016-12-02 VITALS — BP 137/90 | HR 56 | Temp 97.7°F | Resp 16

## 2016-12-02 DIAGNOSIS — Z5111 Encounter for antineoplastic chemotherapy: Secondary | ICD-10-CM | POA: Diagnosis not present

## 2016-12-02 DIAGNOSIS — Z5189 Encounter for other specified aftercare: Secondary | ICD-10-CM

## 2016-12-02 DIAGNOSIS — C8448 Peripheral T-cell lymphoma, not classified, lymph nodes of multiple sites: Secondary | ICD-10-CM | POA: Diagnosis not present

## 2016-12-02 MED ORDER — SODIUM CHLORIDE 0.9 % IV SOLN: Freq: Once | INTRAVENOUS | Status: AC

## 2016-12-02 MED ORDER — SODIUM CHLORIDE 0.9% FLUSH
10.0000 mL | INTRAVENOUS | Status: DC | PRN
  Administered 2016-12-02: 10 mL
  Filled 2016-12-02: qty 10

## 2016-12-02 MED ORDER — SODIUM CHLORIDE 0.9 % IV SOLN
100.0000 mg/m2 | Freq: Once | INTRAVENOUS | Status: AC
  Administered 2016-12-02: 200 mg via INTRAVENOUS
  Filled 2016-12-02: qty 10

## 2016-12-02 MED ORDER — PALONOSETRON HCL INJECTION 0.25 MG/5ML
0.2500 mg | Freq: Once | INTRAVENOUS | Status: DC
  Administered 2016-12-02: 0.25 mg via INTRAVENOUS

## 2016-12-02 MED ORDER — PALONOSETRON HCL INJECTION 0.25 MG/5ML: 0.2500 mg | Freq: Once | INTRAVENOUS | Status: DC

## 2016-12-02 MED ORDER — PALONOSETRON HCL INJECTION 0.25 MG/5ML
INTRAVENOUS | Status: AC
Start: 2016-12-02 — End: 2016-12-02
  Filled 2016-12-02: qty 5

## 2016-12-02 MED ORDER — PEGFILGRASTIM 6 MG/0.6ML ~~LOC~~ PSKT
6.0000 mg | PREFILLED_SYRINGE | Freq: Once | SUBCUTANEOUS | Status: AC
Start: 2016-12-02 — End: 2016-12-02
  Administered 2016-12-02: 6 mg via SUBCUTANEOUS
  Filled 2016-12-02: qty 0.6

## 2016-12-02 MED ORDER — SODIUM CHLORIDE 0.9 % IV SOLN
Freq: Once | INTRAVENOUS | Status: AC
  Administered 2016-12-02: 09:00:00 via INTRAVENOUS

## 2016-12-02 MED ORDER — HEPARIN SOD (PORK) LOCK FLUSH 100 UNIT/ML IV SOLN
500.0000 [IU] | Freq: Once | INTRAVENOUS | Status: AC | PRN
  Administered 2016-12-02: 500 [IU]
  Filled 2016-12-02: qty 5

## 2016-12-02 NOTE — Patient Instructions (Signed)
[facility] Discharge Instructions for Patients Receiving Chemotherapy  Today you received the following chemotherapy agents: Etoposide   To help prevent nausea and vomiting after your treatment, we encourage you to take your nausea medication as directed.    If you develop nausea and vomiting that is not controlled by your nausea medication, call the clinic.   BELOW ARE SYMPTOMS THAT SHOULD BE REPORTED IMMEDIATELY:  *FEVER GREATER THAN 100.5 F  *CHILLS WITH OR WITHOUT FEVER  NAUSEA AND VOMITING THAT IS NOT CONTROLLED WITH YOUR NAUSEA MEDICATION  *UNUSUAL SHORTNESS OF BREATH  *UNUSUAL BRUISING OR BLEEDING  TENDERNESS IN MOUTH AND THROAT WITH OR WITHOUT PRESENCE OF ULCERS  *URINARY PROBLEMS  *BOWEL PROBLEMS  UNUSUAL RASH Items with * indicate a potential emergency and should be followed up as soon as possible.  Feel free to call the clinic you have any questions or concerns. The clinic phone number is [phone].  Please show the CHEMO ALERT CARD at check-in to the Emergency Department and triage nurse.   

## 2016-12-02 NOTE — Progress Notes (Signed)
Per Dr. Irene Limbo okay to run Normal Saline fluids at 546ml/hr with Etoposide.

## 2016-12-14 ENCOUNTER — Other Ambulatory Visit: Payer: Self-pay | Admitting: Family Medicine

## 2016-12-14 NOTE — Telephone Encounter (Signed)
Is this okay to refill? 

## 2016-12-17 ENCOUNTER — Other Ambulatory Visit: Payer: Self-pay | Admitting: Nurse Practitioner

## 2016-12-17 DIAGNOSIS — B3781 Candidal esophagitis: Principal | ICD-10-CM

## 2016-12-17 DIAGNOSIS — B370 Candidal stomatitis: Secondary | ICD-10-CM

## 2016-12-17 NOTE — Telephone Encounter (Signed)
Incoming electronic refill request for Diflucan. LVM with pt asking if she still has thrush.

## 2016-12-18 ENCOUNTER — Other Ambulatory Visit: Payer: Self-pay | Admitting: *Deleted

## 2016-12-18 DIAGNOSIS — C8448 Peripheral T-cell lymphoma, not classified, lymph nodes of multiple sites: Secondary | ICD-10-CM

## 2016-12-21 ENCOUNTER — Ambulatory Visit (HOSPITAL_BASED_OUTPATIENT_CLINIC_OR_DEPARTMENT_OTHER): Payer: BC Managed Care – PPO | Admitting: Hematology

## 2016-12-21 ENCOUNTER — Ambulatory Visit (HOSPITAL_BASED_OUTPATIENT_CLINIC_OR_DEPARTMENT_OTHER): Payer: BC Managed Care – PPO

## 2016-12-21 ENCOUNTER — Encounter: Payer: Self-pay | Admitting: Hematology

## 2016-12-21 ENCOUNTER — Other Ambulatory Visit (HOSPITAL_BASED_OUTPATIENT_CLINIC_OR_DEPARTMENT_OTHER): Payer: BC Managed Care – PPO

## 2016-12-21 ENCOUNTER — Telehealth: Payer: Self-pay | Admitting: Hematology

## 2016-12-21 VITALS — BP 147/68 | HR 60 | Temp 97.9°F | Resp 18 | Ht 66.0 in | Wt 171.3 lb

## 2016-12-21 DIAGNOSIS — C8448 Peripheral T-cell lymphoma, not classified, lymph nodes of multiple sites: Secondary | ICD-10-CM | POA: Diagnosis not present

## 2016-12-21 DIAGNOSIS — R05 Cough: Secondary | ICD-10-CM | POA: Diagnosis not present

## 2016-12-21 DIAGNOSIS — Z5111 Encounter for antineoplastic chemotherapy: Secondary | ICD-10-CM | POA: Diagnosis not present

## 2016-12-21 LAB — CBC WITH DIFFERENTIAL/PLATELET
BASO%: 0.1 % (ref 0.0–2.0)
BASOS ABS: 0 10*3/uL (ref 0.0–0.1)
EOS%: 0 % (ref 0.0–7.0)
Eosinophils Absolute: 0 10*3/uL (ref 0.0–0.5)
HCT: 32.8 % — ABNORMAL LOW (ref 34.8–46.6)
HGB: 11 g/dL — ABNORMAL LOW (ref 11.6–15.9)
LYMPH%: 2.5 % — ABNORMAL LOW (ref 14.0–49.7)
MCH: 25.8 pg (ref 25.1–34.0)
MCHC: 33.6 g/dL (ref 31.5–36.0)
MCV: 76.7 fL — ABNORMAL LOW (ref 79.5–101.0)
MONO#: 1 10*3/uL — ABNORMAL HIGH (ref 0.1–0.9)
MONO%: 8.5 % (ref 0.0–14.0)
NEUT#: 10.8 10*3/uL — ABNORMAL HIGH (ref 1.5–6.5)
NEUT%: 88.9 % — AB (ref 38.4–76.8)
Platelets: 306 10*3/uL (ref 145–400)
RBC: 4.27 10*6/uL (ref 3.70–5.45)
RDW: 16.7 % — ABNORMAL HIGH (ref 11.2–14.5)
WBC: 12.1 10*3/uL — ABNORMAL HIGH (ref 3.9–10.3)
lymph#: 0.3 10*3/uL — ABNORMAL LOW (ref 0.9–3.3)

## 2016-12-21 LAB — COMPREHENSIVE METABOLIC PANEL
ALT: 47 U/L (ref 0–55)
AST: 24 U/L (ref 5–34)
Albumin: 3.3 g/dL — ABNORMAL LOW (ref 3.5–5.0)
Alkaline Phosphatase: 67 U/L (ref 40–150)
Anion Gap: 8 mEq/L (ref 3–11)
BUN: 17.6 mg/dL (ref 7.0–26.0)
CHLORIDE: 99 meq/L (ref 98–109)
CO2: 29 meq/L (ref 22–29)
Calcium: 8.6 mg/dL (ref 8.4–10.4)
Creatinine: 0.7 mg/dL (ref 0.6–1.1)
EGFR: >90 mL/min/{1.73_m2} (ref >90)
GLUCOSE: 135 mg/dL (ref 70–140)
POTASSIUM: 4.2 meq/L (ref 3.5–5.1)
SODIUM: 135 meq/L — AB (ref 136–145)
Total Bilirubin: 0.3 mg/dL (ref 0.20–1.20)
Total Protein: 5.6 g/dL — ABNORMAL LOW (ref 6.4–8.3)

## 2016-12-21 LAB — LACTATE DEHYDROGENASE: LDH: 341 U/L — ABNORMAL HIGH (ref 125–245)

## 2016-12-21 MED ORDER — DOXORUBICIN HCL CHEMO IV INJECTION 2 MG/ML
50.0000 mg/m2 | Freq: Once | INTRAVENOUS | Status: AC
  Administered 2016-12-21: 100 mg via INTRAVENOUS
  Filled 2016-12-21: qty 50

## 2016-12-21 MED ORDER — SODIUM CHLORIDE 0.9% FLUSH
10.0000 mL | INTRAVENOUS | Status: DC | PRN
  Administered 2016-12-21: 10 mL
  Filled 2016-12-21: qty 10

## 2016-12-21 MED ORDER — PALONOSETRON HCL INJECTION 0.25 MG/5ML
0.2500 mg | Freq: Once | INTRAVENOUS | Status: AC
  Administered 2016-12-21: 0.25 mg via INTRAVENOUS

## 2016-12-21 MED ORDER — SODIUM CHLORIDE 0.9 % IV SOLN
100.0000 mg/m2 | Freq: Once | INTRAVENOUS | Status: AC
  Administered 2016-12-21: 200 mg via INTRAVENOUS
  Filled 2016-12-21: qty 10

## 2016-12-21 MED ORDER — SODIUM CHLORIDE 0.9 % IV SOLN
Freq: Once | INTRAVENOUS | Status: AC
  Administered 2016-12-21: 13:00:00 via INTRAVENOUS
  Filled 2016-12-21: qty 5

## 2016-12-21 MED ORDER — PALONOSETRON HCL INJECTION 0.25 MG/5ML
INTRAVENOUS | Status: AC
  Filled 2016-12-21: qty 5

## 2016-12-21 MED ORDER — PREDNISONE 50 MG PO TABS
50.0000 mg | ORAL_TABLET | Freq: Once | ORAL | Status: AC
  Administered 2016-12-21: 50 mg via ORAL

## 2016-12-21 MED ORDER — PREDNISONE 50 MG PO TABS
ORAL_TABLET | ORAL | Status: AC
  Filled 2016-12-21: qty 1

## 2016-12-21 MED ORDER — VINCRISTINE SULFATE CHEMO INJECTION 1 MG/ML
2.0000 mg | Freq: Once | INTRAVENOUS | Status: AC
  Administered 2016-12-21: 2 mg via INTRAVENOUS
  Filled 2016-12-21: qty 2

## 2016-12-21 MED ORDER — SODIUM CHLORIDE 0.9 % IV SOLN
Freq: Once | INTRAVENOUS | Status: AC
  Administered 2016-12-21: 12:00:00 via INTRAVENOUS

## 2016-12-21 MED ORDER — CYCLOPHOSPHAMIDE CHEMO INJECTION 1 GM
750.0000 mg/m2 | Freq: Once | INTRAMUSCULAR | Status: AC
  Administered 2016-12-21: 1500 mg via INTRAVENOUS
  Filled 2016-12-21: qty 75

## 2016-12-21 MED ORDER — HEPARIN SOD (PORK) LOCK FLUSH 100 UNIT/ML IV SOLN
500.0000 [IU] | Freq: Once | INTRAVENOUS | Status: AC | PRN
  Administered 2016-12-21: 500 [IU]
  Filled 2016-12-21: qty 5

## 2016-12-21 NOTE — Patient Instructions (Signed)
[facility] Discharge Instructions for Patients Receiving Chemotherapy  Today you received the following chemotherapy agents: Adriamycin, Vincristine, Cytoxan and Etoposide.  To help prevent nausea and vomiting after your treatment, we encourage you to take your nausea medication: Compazine. Take one every 6 hours as needed.  If you develop nausea and vomiting that is not controlled by your nausea medication, call the clinic.   BELOW ARE SYMPTOMS THAT SHOULD BE REPORTED IMMEDIATELY:  *FEVER GREATER THAN 100.5 F  *CHILLS WITH OR WITHOUT FEVER  NAUSEA AND VOMITING THAT IS NOT CONTROLLED WITH YOUR NAUSEA MEDICATION  *UNUSUAL SHORTNESS OF BREATH  *UNUSUAL BRUISING OR BLEEDING  TENDERNESS IN MOUTH AND THROAT WITH OR WITHOUT PRESENCE OF ULCERS  *URINARY PROBLEMS  *BOWEL PROBLEMS  UNUSUAL RASH Items with * indicate a potential emergency and should be followed up as soon as possible.  Feel free to call the clinic should you have any questions or concerns. The clinic phone number is [phone].  Please show the [place] at check-in to the Emergency Department and triage nurse.

## 2016-12-21 NOTE — Patient Instructions (Signed)
Thank you for choosing [facility] to provide your oncology and hematology care.  To afford each patient quality time with our providers, please arrive 30 minutes before your scheduled appointment time.  If you arrive late for your appointment, you may be asked to reschedule.  We strive to give you quality time with our providers, and arriving late affects you and other patients whose appointments are after yours.  If you are a no show for multiple scheduled visits, you may be dismissed from the clinic at the providers discretion.   Again, thank you for choosing [facility], our hope is that these requests will decrease the amount of time that you wait before being seen by our physicians.  ______________________________________________________________________ Should you have questions after your visit to the [facility], please contact our office at [phone] between the hours of 8:30 and 4:30 p.m.    Voicemails left after 4:30p.m will not be returned until the following business day.   For prescription refill requests, please have your pharmacy contact us directly.  Please also try to allow 48 hours for prescription requests.   Please contact the scheduling department for questions regarding scheduling.  For scheduling of procedures such as PET scans, CT scans, MRI, Ultrasound, etc please contact central scheduling at [phone].   Resources For Cancer Patients and Caregivers:  American Cancer Society:  800-227-2345  Can help patients locate various types of support and financial assistance Cancer Care: 1-800-813-HOPE (4673) Provides financial assistance, online support groups, medication/co-pay assistance.   [place] DSS:  336-641-3447 Where to apply for food stamps, Medicaid, and utility assistance Medicare Rights Center: 800-333-4114 Helps people with Medicare understand their rights and benefits, navigate the Medicare system, and secure the  quality healthcare they deserve SCAT: 336-333-6589 [place] [employer]'s shared-ride transportation service for eligible riders who have a disability that prevents them from riding the fixed route bus.   For additional information on assistance programs please contact our social worker:   Grier Hock/Abigail Elmore:  [phone] 

## 2016-12-21 NOTE — Telephone Encounter (Signed)
Gave patient AVS and calender per 5/14 los  

## 2016-12-22 ENCOUNTER — Ambulatory Visit (HOSPITAL_BASED_OUTPATIENT_CLINIC_OR_DEPARTMENT_OTHER): Payer: BC Managed Care – PPO

## 2016-12-22 VITALS — BP 153/67 | HR 58 | Temp 98.1°F | Resp 18

## 2016-12-22 DIAGNOSIS — Z5111 Encounter for antineoplastic chemotherapy: Secondary | ICD-10-CM | POA: Diagnosis not present

## 2016-12-22 DIAGNOSIS — C8448 Peripheral T-cell lymphoma, not classified, lymph nodes of multiple sites: Secondary | ICD-10-CM

## 2016-12-22 MED ORDER — PROCHLORPERAZINE EDISYLATE 5 MG/ML IJ SOLN
10.0000 mg | Freq: Once | INTRAMUSCULAR | Status: AC
  Administered 2016-12-22: 10 mg via INTRAVENOUS

## 2016-12-22 MED ORDER — SODIUM CHLORIDE 0.9 % IV SOLN
100.0000 mg/m2 | Freq: Once | INTRAVENOUS | Status: AC
  Administered 2016-12-22: 200 mg via INTRAVENOUS
  Filled 2016-12-22: qty 10

## 2016-12-22 MED ORDER — SODIUM CHLORIDE 0.9 % IV SOLN
Freq: Once | INTRAVENOUS | Status: AC
  Administered 2016-12-22: 13:00:00 via INTRAVENOUS

## 2016-12-22 MED ORDER — PROCHLORPERAZINE EDISYLATE 5 MG/ML IJ SOLN
INTRAMUSCULAR | Status: AC
  Filled 2016-12-22: qty 2

## 2016-12-22 MED ORDER — SODIUM CHLORIDE 0.9% FLUSH
10.0000 mL | INTRAVENOUS | Status: DC | PRN
  Administered 2016-12-22: 10 mL
  Filled 2016-12-22: qty 10

## 2016-12-22 MED ORDER — HEPARIN SOD (PORK) LOCK FLUSH 100 UNIT/ML IV SOLN
500.0000 [IU] | Freq: Once | INTRAVENOUS | Status: AC | PRN
  Administered 2016-12-22: 500 [IU]
  Filled 2016-12-22: qty 5

## 2016-12-22 NOTE — Patient Instructions (Signed)
[facility] Discharge Instructions for Patients Receiving Chemotherapy  Today you received the following chemotherapy agents: Etoposide   To help prevent nausea and vomiting after your treatment, we encourage you to take your nausea medication as directed.    If you develop nausea and vomiting that is not controlled by your nausea medication, call the clinic.   BELOW ARE SYMPTOMS THAT SHOULD BE REPORTED IMMEDIATELY:  *FEVER GREATER THAN 100.5 F  *CHILLS WITH OR WITHOUT FEVER  NAUSEA AND VOMITING THAT IS NOT CONTROLLED WITH YOUR NAUSEA MEDICATION  *UNUSUAL SHORTNESS OF BREATH  *UNUSUAL BRUISING OR BLEEDING  TENDERNESS IN MOUTH AND THROAT WITH OR WITHOUT PRESENCE OF ULCERS  *URINARY PROBLEMS  *BOWEL PROBLEMS  UNUSUAL RASH Items with * indicate a potential emergency and should be followed up as soon as possible.  Feel free to call the clinic you have any questions or concerns. The clinic phone number is [phone].  Please show the CHEMO ALERT CARD at check-in to the Emergency Department and triage nurse.   

## 2016-12-22 NOTE — Progress Notes (Signed)
Denise [name]    HEMATOLOGY/ONCOLOGY CLINIC NOTE  Date of Service: .12/21/2016  Patient Care Team: Denita Lung, MD as PCP - General (Family Medicine)  CHIEF COMPLAINTS/PURPOSE OF CONSULTATION:  f/u for PTCL NOS  HISTORY OF PRESENTING ILLNESS:  plz see previous note for HPI  INTERVAL HISTORY  Denise [name] is here with her partner for her scheduled follow-up prior to her third cycle of chemotherapy. She  Notes that she had significant fatigue in her second week but the last week was better. Has been taking Compazine twice a day and was feeling quite sleepy. She was recommended to take this only as needed as it has been prescribed. No fevers no chills no night sweats no cough. Labs stable. Notes that  Her insurance company refused to fill her Zofran. Have requested our oral chemotherapy pharmacist to look into this.    MEDICAL HISTORY:  Past Medical History:  Diagnosis Date  . Allergy   . Asthma   . Bursitis of left hip   . Fibroids    TAH in 1999  . GERD (gastroesophageal reflux disease)   . PVC's (premature ventricular contractions)   . Sleep apnea     SURGICAL HISTORY: Past Surgical History:  Procedure Laterality Date  . CARDIAC CATH  05/2007   J. BERRY  . HAMMER TOE SURGERY Bilateral 2016  . IR GENERIC HISTORICAL  11/05/2016   IR US GUIDE VASC ACCESS RIGHT 11/05/2016 [name], MD [facility]  . IR GENERIC HISTORICAL  11/05/2016   IR FLUORO GUIDE PORT INSERTION RIGHT 11/05/2016 [name], MD [facility]  . TOTAL ABDOMINAL HYSTERECTOMY  05/15/1998   secondary to fibroids  . WISDOM TOOTH EXTRACTION  mid teens    SOCIAL HISTORY: Social History   Social History  . Marital status: Significant Other    Spouse name: N/A  . Number of children: 0  . Years of education: N/A   Occupational History  .  [place] History Main Topics  . Smoking status: Never Smoker  . Smokeless tobacco: Never Used  . Alcohol use 0.6 - 1.2 oz/week    1 - 2 Standard  drinks or equivalent per week     Comment: only if out to dinner, maybe once a month  . Drug use: No  . Sexual activity: Yes    Partners: Female    Birth control/ protection: None   Other Topics Concern  . Not on file   Social History Narrative  . No narrative on file    FAMILY HISTORY: Family History  Problem Relation Age of Onset  . Cancer Maternal Grandmother        GI cancer in 36's  . Dementia Mother   . Heart disease Father   . Heart failure Father     ALLERGIES:  is allergic to morphine and related.  MEDICATIONS:  Current Outpatient Prescriptions  Medication Sig Dispense Refill  . acyclovir (ZOVIRAX) 400 MG tablet Take 1 tablet (400 mg total) by mouth 2 (two) times daily. 60 tablet 2  . ADVAIR DISKUS 100-50 MCG/DOSE AEPB Inhale 1 puff into the lungs daily.     Denise [name] albuterol (PROVENTIL HFA;VENTOLIN HFA) 108 (90 Base) MCG/ACT inhaler Inhale 2 puffs into the lungs every 6 (six) hours as needed for wheezing or shortness of breath. 1 Inhaler 0  . ALPRAZolam (XANAX) 0.25 MG tablet TAKE 1 TABLET BY MOUTH TWICE A DAY AS NEEDED FOR ANXIETY 50 tablet 0  . aspirin 81 MG chewable  tablet Chew 81 mg by mouth daily.    Denise [name] atenolol (TENORMIN) 50 MG tablet TAKE 1 TABLET BY MOUTH TWICE DAILY. 180 tablet 1  . dexamethasone (DECADRON) 4 MG tablet TAKE 1 TABLET(4 MG) BY MOUTH DAILY WITH LUNCH 30 tablet 0  . DULoxetine (CYMBALTA) 30 MG capsule Take 1 capsule (30 mg total) by mouth daily. 30 capsule 3  . esomeprazole (NEXIUM) 40 MG capsule Take 1 capsule (40 mg total) by mouth daily before breakfast. 100 capsule 0  . fluticasone (FLONASE) 50 MCG/ACT nasal spray Place 2 sprays into both nostrils daily. 16 g 11  . hydrochlorothiazide (MICROZIDE) 12.5 MG capsule TAKE ONE CAPSULE BY MOUTH EVERY DAY 30 capsule 11  . lidocaine-prilocaine (EMLA) cream Apply to affected area once 30 g 3  . nystatin (MYCOSTATIN) 100000 UNIT/ML suspension Take 5 mLs (500,000 Units total) by mouth 4 (four) times daily as  needed (thrush). 100 mL 2  . ondansetron (ZOFRAN ODT) 8 MG disintegrating tablet Take 1 tablet (8 mg total) by mouth every 8 (eight) hours as needed for nausea or vomiting. 20 tablet 0  . ondansetron (ZOFRAN) 8 MG tablet Take 1 tablet (8 mg total) by mouth 2 (two) times daily as needed for refractory nausea / vomiting. 30 tablet 1  . predniSONE (DELTASONE) 20 MG tablet Take 4 tablets (80 mg total) with breakfast. Take days 1-5 with chemotherapy. Disregard previous prescription for this patient. Correct Rx. 20 tablet 0  . prochlorperazine (COMPAZINE) 10 MG tablet Take 1 tablet (10 mg total) by mouth every 6 (six) hours as needed (Nausea or vomiting). 30 tablet 6   No current facility-administered medications for this visit.    Facility-Administered Medications Ordered in Other Visits  Medication Dose Route Frequency Provider Last Rate Last Dose  . sodium chloride flush (NS) 0.9 % injection 10 mL  10 mL Intracatheter PRN Brunetta Genera, MD   10 mL at 12/01/16 1731    REVIEW OF SYSTEMS:    10 Point review of Systems was done is negative except as noted above.  PHYSICAL EXAMINATION: ECOG PERFORMANCE STATUS: 1 - Symptomatic but completely ambulatory  . Vitals:   12/21/16 1105  BP: (!) 147/68  Pulse: 60  Resp: 18  Temp: 97.9 F (36.6 C)   Filed Weights   12/21/16 1105  Weight: 171 lb 4.8 oz (77.7 kg)   .Body mass index is 27.65 kg/m.  GENERAL:alert, in no acute distress and comfortable SKIN: skin color, texture, turgor are normal, no rashes or significant lesions EYES: normal, conjunctiva are pink and non-injected, sclera clear OROPHARYNX:no exudate, no erythema and lips, buccal mucosa, and tongue normal  NECK: supple, no JVD,No inspiratory stridor, no facial swelling or upper extremity swelling. LYMPH: Extensive bilateral supraclavicular lymphadenopathy, no palpable inguinal or axillary lymphadenopathy noted. LUNGS: clear to auscultation with normal respiratory  effort HEART: regular rate & rhythm,  no murmurs and no lower extremity edema ABDOMEN: abdomen soft, non-tender, normoactive bowel sounds  Musculoskeletal: no cyanosis of digits and no clubbing  PSYCH: alert & oriented x 3 with fluent speech NEURO: no focal motor/sensory deficits  LABORATORY DATA:  I have reviewed the data as listed  . CBC Latest Ref Rng & Units 12/21/2016 11/30/2016 11/24/2016  WBC 3.9 - 10.3 10e3/uL 12.1(H) 10.7(H) 12.7(H)  Hemoglobin 11.6 - 15.9 g/dL 11.0(L) 11.9 12.7  Hematocrit 34.8 - 46.6 % 32.8(L) 35.4 37.4  Platelets 145 - 400 10e3/uL 306 278 191    CMP Latest Ref Rng & Units 12/21/2016 11/30/2016  11/24/2016  Glucose 70 - 140 mg/dl 135 130 118  BUN 7.0 - 26.0 mg/dL 17.6 15.1 15.4  Creatinine 0.6 - 1.1 mg/dL 0.7 0.6 0.7  Sodium 136 - 145 mEq/L 135(L) 137 137  Potassium 3.5 - 5.1 mEq/L 4.2 3.9 3.9  Chloride 98 - 110 mmol/L - - -  CO2 22 - 29 mEq/L '29 26 25  ' Calcium 8.4 - 10.4 mg/dL 8.6 8.6 8.7  Total Protein 6.4 - 8.3 g/dL 5.6(L) 6.0(L) 6.0(L)  Total Bilirubin 0.20 - 1.20 mg/dL 0.30 <0.22 <0.22  Alkaline Phos 40 - 150 U/L 67 81 86  AST 5 - 34 U/L '24 29 15  ' ALT 0 - 55 U/L 47 35 20   . Component     Latest Ref Rng & Units 10/16/2016  Uric Acid, Serum     2.6 - 7.4 mg/dl 6.4  Phosphorus     2.5 - 4.5 mg/dL 3.6  LDH     125 - 245 U/L 247 (H)  Hep C Virus Ab     0.0 - 0.9 s/co ratio <0.1  Hepatitis B Surface Ag     Negative Negative  Hep B Core Ab, Tot     Negative Negative  HIV     Non Reactive Non Reactive         RADIOGRAPHIC STUDIES: I have personally reviewed the radiological images as listed and agreed with the findings in the report. No results found.  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   PET/CT scan shows bulky hypermetabolic lymphadenopathy throughout the neck, chest, and abdomen. 1 cm  hypermetabolic pelvic lymph node in right external iliac chain. Hypermetabolic masslike opacity in central right lung with multiple adjacent hypermetabolic nodules. This is suspicious for pulmonary involvement by lymphoma  Fatigue anorexia or night sweats-drenching, and weight loss suggest constitutional type B symptoms.  Hepatitis profile and HIV neg  Patient is s/p 2 cycles of CHOEP tolerated well overall Grade 1 fatigue Grade 2 nausea/vomiting and some dehydration after the first cycle of treatment  needing IV anti-emetics and IVF. Much improved after adjustment of her supportive medications . Plan -labs are stable and patient appears to have recovered her counts -continue current supportive medications with Emend D1 and additional aloxi D3 from her 2nd cycle to optimize anti-emetics since patient is particular sensitive to nausea from Ctx -has prn zofran and compazine at home. Patient has been using twice daily Compazine which made her sleepy .she was recommended to take this only as needed.  -Proceed with her third cycle of treatment from today . -We will have my RN follow-up on why Zofran isn't being authorized by her insurance company . --discussed consideration of IT MTX for CNS prophylaxis- would like to hold off at this time . --has had her  appointment with Dr Dellis Filbert at [facility] on 12/01/2016. 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 -No overt evidence of TLS at this time . We'll discontinue her allopurinol  - PET/CT scan prior to her fourth cycle of treatment in the next couple of weeks - ordered .   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 -In the setting of asthma with her primary care physician consider transitioning to a calcium  channel blocker if appropriate.  #4 history of sleep apnea has been noncompliant with  her CPAP use. She does have a CPAP machine after counseling has been using this regularly and has been doing a better job with this .  #5 severe anxiety - this appears somewhat improved -Has been using Xanax when necessary.  -continue on Cymbalta 30 mg by mouth daily. -recommended staying as physically active as possible.  #6 persistent dry cough likely due to airway irritation from mediastinal adenopathy.  Appears to have resolved with starting treatment.  PET/CT in 16 days Continue Chemotherapy as per schedule RTC with Dr Irene Limbo in 3 weeks with C4D1 with labs  All of the patients questions were answered with apparent satisfaction. The patient knows to call the clinic with any problems, questions or concerns.  I spent 30 minutes counseling the patient face to face. The total time spent in the appointment was 40 minutes and more than 50% was on counseling and direct patient care and co-ordination of cares with Dr Candie Echevaria [place] MD [facility] Hematology/Oncology Physician [place]  (Office):       [phone] (Work cell):  [phone] (Fax):           [phone]

## 2016-12-23 ENCOUNTER — Ambulatory Visit (HOSPITAL_BASED_OUTPATIENT_CLINIC_OR_DEPARTMENT_OTHER): Payer: BC Managed Care – PPO

## 2016-12-23 VITALS — BP 159/75 | HR 58 | Temp 98.1°F | Resp 18

## 2016-12-23 DIAGNOSIS — Z5189 Encounter for other specified aftercare: Secondary | ICD-10-CM

## 2016-12-23 DIAGNOSIS — Z5111 Encounter for antineoplastic chemotherapy: Secondary | ICD-10-CM

## 2016-12-23 DIAGNOSIS — C8448 Peripheral T-cell lymphoma, not classified, lymph nodes of multiple sites: Secondary | ICD-10-CM

## 2016-12-23 MED ORDER — PALONOSETRON HCL INJECTION 0.25 MG/5ML
0.2500 mg | Freq: Once | INTRAVENOUS | Status: AC
  Administered 2016-12-23: 0.25 mg via INTRAVENOUS

## 2016-12-23 MED ORDER — HEPARIN SOD (PORK) LOCK FLUSH 100 UNIT/ML IV SOLN
500.0000 [IU] | Freq: Once | INTRAVENOUS | Status: AC | PRN
  Administered 2016-12-23: 500 [IU]
  Filled 2016-12-23: qty 5

## 2016-12-23 MED ORDER — SODIUM CHLORIDE 0.9% FLUSH
10.0000 mL | INTRAVENOUS | Status: DC | PRN
  Administered 2016-12-23: 10 mL
  Filled 2016-12-23: qty 10

## 2016-12-23 MED ORDER — PEGFILGRASTIM 6 MG/0.6ML ~~LOC~~ PSKT
6.0000 mg | PREFILLED_SYRINGE | Freq: Once | SUBCUTANEOUS | Status: AC
  Administered 2016-12-23: 6 mg via SUBCUTANEOUS
  Filled 2016-12-23: qty 0.6

## 2016-12-23 MED ORDER — PALONOSETRON HCL INJECTION 0.25 MG/5ML
INTRAVENOUS | Status: AC
  Filled 2016-12-23: qty 5

## 2016-12-23 MED ORDER — SODIUM CHLORIDE 0.9 % IV SOLN
1000.0000 mL | Freq: Once | INTRAVENOUS | Status: AC
  Administered 2016-12-23: 1000 mL via INTRAVENOUS

## 2016-12-23 MED ORDER — SODIUM CHLORIDE 0.9 % IV SOLN
100.0000 mg/m2 | Freq: Once | INTRAVENOUS | Status: AC
  Administered 2016-12-23: 200 mg via INTRAVENOUS
  Filled 2016-12-23: qty 10

## 2016-12-24 ENCOUNTER — Encounter: Payer: Self-pay | Admitting: *Deleted

## 2016-12-24 NOTE — Progress Notes (Signed)
Patient called stating that her hemorrhoids are causing bleeding. Bleeding is not excessive, but just a couple of dots of blood. Patient wanted to make MD [facility] office aware.

## 2016-12-25 ENCOUNTER — Other Ambulatory Visit: Payer: Self-pay | Admitting: Hematology

## 2017-01-01 ENCOUNTER — Other Ambulatory Visit: Payer: Self-pay | Admitting: Hematology

## 2017-01-01 ENCOUNTER — Other Ambulatory Visit: Payer: Self-pay | Admitting: Nurse Practitioner

## 2017-01-01 DIAGNOSIS — B3781 Candidal esophagitis: Principal | ICD-10-CM

## 2017-01-01 DIAGNOSIS — B370 Candidal stomatitis: Secondary | ICD-10-CM

## 2017-01-05 ENCOUNTER — Other Ambulatory Visit: Payer: Self-pay | Admitting: *Deleted

## 2017-01-05 MED ORDER — PREDNISONE 20 MG PO TABS: ORAL_TABLET | ORAL | 0 refills | Status: DC

## 2017-01-05 MED ORDER — DEXAMETHASONE 4 MG PO TABS: ORAL_TABLET | ORAL | 0 refills | Status: DC

## 2017-01-08 ENCOUNTER — Ambulatory Visit (HOSPITAL_COMMUNITY)
Admission: RE | Admit: 2017-01-08 | Discharge: 2017-01-08 | Disposition: A | Discharge status: Home / Self Care | Payer: BC Managed Care – PPO | Source: Ambulatory Visit | Attending: Hematology | Admitting: Hematology

## 2017-01-08 ENCOUNTER — Other Ambulatory Visit: Payer: Self-pay | Admitting: *Deleted

## 2017-01-08 DIAGNOSIS — C8448 Peripheral T-cell lymphoma, not classified, lymph nodes of multiple sites: Secondary | ICD-10-CM | POA: Insufficient documentation

## 2017-01-08 LAB — GLUCOSE, CAPILLARY: GLUCOSE-CAPILLARY: 139 mg/dL — AB (ref 65–99)

## 2017-01-08 MED ORDER — FLUDEOXYGLUCOSE F - 18 (FDG) INJECTION
8.3200 | Freq: Once | INTRAVENOUS | Status: AC | PRN
  Administered 2017-01-08: 8.32 via INTRAVENOUS

## 2017-01-11 ENCOUNTER — Other Ambulatory Visit (HOSPITAL_BASED_OUTPATIENT_CLINIC_OR_DEPARTMENT_OTHER): Payer: BC Managed Care – PPO

## 2017-01-11 ENCOUNTER — Encounter: Payer: Self-pay | Admitting: Hematology

## 2017-01-11 ENCOUNTER — Ambulatory Visit (HOSPITAL_BASED_OUTPATIENT_CLINIC_OR_DEPARTMENT_OTHER): Payer: BC Managed Care – PPO

## 2017-01-11 ENCOUNTER — Telehealth: Payer: Self-pay | Admitting: Hematology

## 2017-01-11 ENCOUNTER — Ambulatory Visit (HOSPITAL_BASED_OUTPATIENT_CLINIC_OR_DEPARTMENT_OTHER): Payer: BC Managed Care – PPO | Admitting: Hematology

## 2017-01-11 VITALS — BP 133/68 | HR 64 | Temp 98.0°F | Resp 18 | Ht 66.0 in | Wt 175.6 lb

## 2017-01-11 DIAGNOSIS — C8448 Peripheral T-cell lymphoma, not classified, lymph nodes of multiple sites: Secondary | ICD-10-CM

## 2017-01-11 DIAGNOSIS — F419 Anxiety disorder, unspecified: Secondary | ICD-10-CM | POA: Diagnosis not present

## 2017-01-11 DIAGNOSIS — Z5111 Encounter for antineoplastic chemotherapy: Secondary | ICD-10-CM | POA: Diagnosis not present

## 2017-01-11 DIAGNOSIS — C8478 Anaplastic large cell lymphoma, ALK-negative, lymph nodes of multiple sites: Secondary | ICD-10-CM

## 2017-01-11 LAB — CBC & DIFF AND RETIC
BASO%: 0.1 % (ref 0.0–2.0)
Basophils Absolute: 0 10*3/uL (ref 0.0–0.1)
EOS ABS: 0 10*3/uL (ref 0.0–0.5)
EOS%: 0 % (ref 0.0–7.0)
HCT: 29.5 % — ABNORMAL LOW (ref 34.8–46.6)
HEMOGLOBIN: 9.7 g/dL — AB (ref 11.6–15.9)
IMMATURE RETIC FRACT: 19.4 % — AB (ref 1.60–10.00)
LYMPH#: 0.8 10*3/uL — AB (ref 0.9–3.3)
LYMPH%: 7.5 % — AB (ref 14.0–49.7)
MCH: 27.2 pg (ref 25.1–34.0)
MCHC: 32.9 g/dL (ref 31.5–36.0)
MCV: 82.9 fL (ref 79.5–101.0)
MONO#: 0.3 10*3/uL (ref 0.1–0.9)
MONO%: 3.2 % (ref 0.0–14.0)
NEUT%: 89.2 % — AB (ref 38.4–76.8)
NEUTROS ABS: 9.3 10*3/uL — AB (ref 1.5–6.5)
PLATELETS: 267 10*3/uL (ref 145–400)
RBC: 3.56 10*6/uL — AB (ref 3.70–5.45)
RDW: 21.7 % — ABNORMAL HIGH (ref 11.2–14.5)
RETIC CT ABS: 194.38 10*3/uL — AB (ref 33.70–90.70)
Retic %: 5.46 % — ABNORMAL HIGH (ref 0.70–2.10)
WBC: 10.4 10*3/uL — ABNORMAL HIGH (ref 3.9–10.3)

## 2017-01-11 LAB — COMPREHENSIVE METABOLIC PANEL
ALBUMIN: 3.4 g/dL — AB (ref 3.5–5.0)
ALK PHOS: 73 U/L (ref 40–150)
ALT: 20 U/L (ref 0–55)
AST: 13 U/L (ref 5–34)
Anion Gap: 7 mEq/L (ref 3–11)
BILIRUBIN TOTAL: 0.34 mg/dL (ref 0.20–1.20)
BUN: 11.7 mg/dL (ref 7.0–26.0)
CO2: 28 meq/L (ref 22–29)
CREATININE: 0.6 mg/dL (ref 0.6–1.1)
Calcium: 8.8 mg/dL (ref 8.4–10.4)
Chloride: 103 mEq/L (ref 98–109)
EGFR: >90 mL/min/{1.73_m2} (ref >90)
Glucose: 160 mg/dl — ABNORMAL HIGH (ref 70–140)
Potassium: 3.8 mEq/L (ref 3.5–5.1)
SODIUM: 138 meq/L (ref 136–145)
TOTAL PROTEIN: 5.9 g/dL — AB (ref 6.4–8.3)

## 2017-01-11 LAB — LACTATE DEHYDROGENASE: LDH: 357 U/L — ABNORMAL HIGH (ref 125–245)

## 2017-01-11 MED ORDER — PALONOSETRON HCL INJECTION 0.25 MG/5ML
INTRAVENOUS | Status: AC
  Filled 2017-01-11: qty 5

## 2017-01-11 MED ORDER — PREDNISONE 20 MG PO TABS: ORAL_TABLET | ORAL | 2 refills | Status: DC

## 2017-01-11 MED ORDER — VINCRISTINE SULFATE CHEMO INJECTION 1 MG/ML
2.0000 mg | Freq: Once | INTRAVENOUS | Status: AC
  Administered 2017-01-11: 2 mg via INTRAVENOUS
  Filled 2017-01-11: qty 2

## 2017-01-11 MED ORDER — SODIUM CHLORIDE 0.9% FLUSH
10.0000 mL | INTRAVENOUS | Status: DC | PRN
  Administered 2017-01-11: 10 mL
  Filled 2017-01-11: qty 10

## 2017-01-11 MED ORDER — DOXORUBICIN HCL CHEMO IV INJECTION 2 MG/ML
50.0000 mg/m2 | Freq: Once | INTRAVENOUS | Status: AC
  Administered 2017-01-11: 100 mg via INTRAVENOUS
  Filled 2017-01-11: qty 50

## 2017-01-11 MED ORDER — SODIUM CHLORIDE 0.9 % IV SOLN
Freq: Once | INTRAVENOUS | Status: AC
  Administered 2017-01-11: 13:00:00 via INTRAVENOUS
  Filled 2017-01-11: qty 5

## 2017-01-11 MED ORDER — SODIUM CHLORIDE 0.9 % IV SOLN
100.0000 mg/m2 | Freq: Once | INTRAVENOUS | Status: AC
  Administered 2017-01-11: 200 mg via INTRAVENOUS
  Filled 2017-01-11: qty 10

## 2017-01-11 MED ORDER — PALONOSETRON HCL INJECTION 0.25 MG/5ML
0.2500 mg | Freq: Once | INTRAVENOUS | Status: AC
  Administered 2017-01-11: 0.25 mg via INTRAVENOUS

## 2017-01-11 MED ORDER — SODIUM CHLORIDE 0.9 % IV SOLN
Freq: Once | INTRAVENOUS | Status: AC
  Administered 2017-01-11: 13:00:00 via INTRAVENOUS

## 2017-01-11 MED ORDER — SODIUM CHLORIDE 0.9 % IV SOLN
750.0000 mg/m2 | Freq: Once | INTRAVENOUS | Status: AC
  Administered 2017-01-11: 1500 mg via INTRAVENOUS
  Filled 2017-01-11: qty 75

## 2017-01-11 MED ORDER — HEPARIN SOD (PORK) LOCK FLUSH 100 UNIT/ML IV SOLN
500.0000 [IU] | Freq: Once | INTRAVENOUS | Status: AC | PRN
  Administered 2017-01-11: 500 [IU]
  Filled 2017-01-11: qty 5

## 2017-01-11 NOTE — Telephone Encounter (Signed)
Scheduled appt per 6/4 LOS and treatment plan - Gave patient AVS and calender.

## 2017-01-11 NOTE — Patient Instructions (Addendum)
[place] Discharge Instructions for Patients Receiving Chemotherapy  Today you received the following chemotherapy agents Adriamycin, Cytoxan, Vepesid, Vincristine  To help prevent nausea and vomiting after your treatment, we encourage you to take your nausea medication as directed If you develop nausea and vomiting that is not controlled by your nausea medication, call the clinic.   BELOW ARE SYMPTOMS THAT SHOULD BE REPORTED IMMEDIATELY:  *FEVER GREATER THAN 100.5 F  *CHILLS WITH OR WITHOUT FEVER  NAUSEA AND VOMITING THAT IS NOT CONTROLLED WITH YOUR NAUSEA MEDICATION  *UNUSUAL SHORTNESS OF BREATH  *UNUSUAL BRUISING OR BLEEDING  TENDERNESS IN MOUTH AND THROAT WITH OR WITHOUT PRESENCE OF ULCERS  *URINARY PROBLEMS  *BOWEL PROBLEMS  UNUSUAL RASH Items with * indicate a potential emergency and should be followed up as soon as possible.  Feel free to call the clinic you have any questions or concerns. The clinic phone number is [phone].  Please show the [address] at check-in to the Emergency Department and triage nurse.

## 2017-01-12 ENCOUNTER — Ambulatory Visit (HOSPITAL_BASED_OUTPATIENT_CLINIC_OR_DEPARTMENT_OTHER): Payer: BC Managed Care – PPO

## 2017-01-12 VITALS — BP 129/74 | HR 63 | Temp 98.0°F | Resp 18

## 2017-01-12 DIAGNOSIS — Z5111 Encounter for antineoplastic chemotherapy: Secondary | ICD-10-CM | POA: Diagnosis not present

## 2017-01-12 DIAGNOSIS — C8448 Peripheral T-cell lymphoma, not classified, lymph nodes of multiple sites: Secondary | ICD-10-CM | POA: Diagnosis not present

## 2017-01-12 MED ORDER — SODIUM CHLORIDE 0.9% FLUSH
10.0000 mL | INTRAVENOUS | Status: DC | PRN
  Filled 2017-01-12: qty 10

## 2017-01-12 MED ORDER — PROCHLORPERAZINE EDISYLATE 5 MG/ML IJ SOLN
10.0000 mg | Freq: Once | INTRAMUSCULAR | Status: AC
  Administered 2017-01-12: 10 mg via INTRAVENOUS

## 2017-01-12 MED ORDER — HEPARIN SOD (PORK) LOCK FLUSH 100 UNIT/ML IV SOLN
500.0000 [IU] | Freq: Once | INTRAVENOUS | Status: DC | PRN
  Filled 2017-01-12: qty 5

## 2017-01-12 MED ORDER — PROCHLORPERAZINE EDISYLATE 5 MG/ML IJ SOLN
INTRAMUSCULAR | Status: AC
  Filled 2017-01-12: qty 2

## 2017-01-12 MED ORDER — SODIUM CHLORIDE 0.9 % IV SOLN
100.0000 mg/m2 | Freq: Once | INTRAVENOUS | Status: AC
  Administered 2017-01-12: 200 mg via INTRAVENOUS
  Filled 2017-01-12: qty 10

## 2017-01-12 MED ORDER — SODIUM CHLORIDE 0.9 % IV SOLN
Freq: Once | INTRAVENOUS | Status: AC
  Administered 2017-01-12: 09:00:00 via INTRAVENOUS

## 2017-01-12 NOTE — Patient Instructions (Signed)
[facility] Discharge Instructions for Patients Receiving Chemotherapy  Today you received the following chemotherapy agents: Etoposide   To help prevent nausea and vomiting after your treatment, we encourage you to take your nausea medication as directed.    If you develop nausea and vomiting that is not controlled by your nausea medication, call the clinic.   BELOW ARE SYMPTOMS THAT SHOULD BE REPORTED IMMEDIATELY:  *FEVER GREATER THAN 100.5 F  *CHILLS WITH OR WITHOUT FEVER  NAUSEA AND VOMITING THAT IS NOT CONTROLLED WITH YOUR NAUSEA MEDICATION  *UNUSUAL SHORTNESS OF BREATH  *UNUSUAL BRUISING OR BLEEDING  TENDERNESS IN MOUTH AND THROAT WITH OR WITHOUT PRESENCE OF ULCERS  *URINARY PROBLEMS  *BOWEL PROBLEMS  UNUSUAL RASH Items with * indicate a potential emergency and should be followed up as soon as possible.  Feel free to call the clinic you have any questions or concerns. The clinic phone number is [phone].  Please show the CHEMO ALERT CARD at check-in to the Emergency Department and triage nurse.   

## 2017-01-13 ENCOUNTER — Ambulatory Visit (HOSPITAL_BASED_OUTPATIENT_CLINIC_OR_DEPARTMENT_OTHER): Payer: BC Managed Care – PPO

## 2017-01-13 VITALS — BP 148/66 | HR 63 | Temp 98.3°F | Resp 18

## 2017-01-13 DIAGNOSIS — Z5111 Encounter for antineoplastic chemotherapy: Secondary | ICD-10-CM | POA: Diagnosis not present

## 2017-01-13 DIAGNOSIS — C8448 Peripheral T-cell lymphoma, not classified, lymph nodes of multiple sites: Secondary | ICD-10-CM | POA: Diagnosis not present

## 2017-01-13 DIAGNOSIS — Z5189 Encounter for other specified aftercare: Secondary | ICD-10-CM | POA: Diagnosis not present

## 2017-01-13 MED ORDER — SODIUM CHLORIDE 0.9% FLUSH
10.0000 mL | INTRAVENOUS | Status: DC | PRN
  Administered 2017-01-13: 10 mL
  Filled 2017-01-13: qty 10

## 2017-01-13 MED ORDER — SODIUM CHLORIDE 0.9 % IV SOLN
100.0000 mg/m2 | Freq: Once | INTRAVENOUS | Status: AC
  Administered 2017-01-13: 200 mg via INTRAVENOUS
  Filled 2017-01-13: qty 10

## 2017-01-13 MED ORDER — SODIUM CHLORIDE 0.9 % IV SOLN: Freq: Once | INTRAVENOUS | Status: DC

## 2017-01-13 MED ORDER — PALONOSETRON HCL INJECTION 0.25 MG/5ML
0.2500 mg | Freq: Once | INTRAVENOUS | Status: AC
  Administered 2017-01-13: 0.25 mg via INTRAVENOUS

## 2017-01-13 MED ORDER — HEPARIN SOD (PORK) LOCK FLUSH 100 UNIT/ML IV SOLN
500.0000 [IU] | Freq: Once | INTRAVENOUS | Status: AC | PRN
  Administered 2017-01-13: 500 [IU]
  Filled 2017-01-13: qty 5

## 2017-01-13 MED ORDER — SODIUM CHLORIDE 0.9 % IV SOLN
Freq: Once | INTRAVENOUS | Status: AC
  Administered 2017-01-13: 08:00:00 via INTRAVENOUS

## 2017-01-13 MED ORDER — PEGFILGRASTIM 6 MG/0.6ML ~~LOC~~ PSKT
6.0000 mg | PREFILLED_SYRINGE | Freq: Once | SUBCUTANEOUS | Status: AC
  Administered 2017-01-13: 6 mg via SUBCUTANEOUS
  Filled 2017-01-13: qty 0.6

## 2017-01-13 MED ORDER — PALONOSETRON HCL INJECTION 0.25 MG/5ML
INTRAVENOUS | Status: AC
  Filled 2017-01-13: qty 5

## 2017-01-13 NOTE — Patient Instructions (Signed)
[facility] Discharge Instructions for Patients Receiving Chemotherapy  Today you received the following chemotherapy agents: Etoposide   To help prevent nausea and vomiting after your treatment, we encourage you to take your nausea medication as directed.    If you develop nausea and vomiting that is not controlled by your nausea medication, call the clinic.   BELOW ARE SYMPTOMS THAT SHOULD BE REPORTED IMMEDIATELY:  *FEVER GREATER THAN 100.5 F  *CHILLS WITH OR WITHOUT FEVER  NAUSEA AND VOMITING THAT IS NOT CONTROLLED WITH YOUR NAUSEA MEDICATION  *UNUSUAL SHORTNESS OF BREATH  *UNUSUAL BRUISING OR BLEEDING  TENDERNESS IN MOUTH AND THROAT WITH OR WITHOUT PRESENCE OF ULCERS  *URINARY PROBLEMS  *BOWEL PROBLEMS  UNUSUAL RASH Items with * indicate a potential emergency and should be followed up as soon as possible.  Feel free to call the clinic you have any questions or concerns. The clinic phone number is [phone].  Please show the CHEMO ALERT CARD at check-in to the Emergency Department and triage nurse.   

## 2017-01-18 ENCOUNTER — Telehealth: Payer: Self-pay

## 2017-01-18 ENCOUNTER — Other Ambulatory Visit: Payer: Self-pay

## 2017-01-18 ENCOUNTER — Emergency Department (HOSPITAL_COMMUNITY): Payer: BC Managed Care – PPO

## 2017-01-18 ENCOUNTER — Encounter (HOSPITAL_COMMUNITY): Payer: Self-pay

## 2017-01-18 ENCOUNTER — Inpatient Hospital Stay (HOSPITAL_COMMUNITY)
Admission: EM | Admit: 2017-01-18 | Discharge: 2017-01-24 | DRG: 369 | Disposition: A | Discharge status: Home / Self Care | Payer: BC Managed Care – PPO | Attending: Family Medicine | Admitting: Family Medicine

## 2017-01-18 ENCOUNTER — Other Ambulatory Visit: Payer: Self-pay | Admitting: Hematology

## 2017-01-18 DIAGNOSIS — K210 Gastro-esophageal reflux disease with esophagitis: Secondary | ICD-10-CM | POA: Diagnosis present

## 2017-01-18 DIAGNOSIS — R079 Chest pain, unspecified: Secondary | ICD-10-CM | POA: Diagnosis present

## 2017-01-18 DIAGNOSIS — K2210 Ulcer of esophagus without bleeding: Secondary | ICD-10-CM | POA: Diagnosis not present

## 2017-01-18 DIAGNOSIS — R1310 Dysphagia, unspecified: Secondary | ICD-10-CM | POA: Diagnosis not present

## 2017-01-18 DIAGNOSIS — J309 Allergic rhinitis, unspecified: Secondary | ICD-10-CM | POA: Diagnosis present

## 2017-01-18 DIAGNOSIS — J4530 Mild persistent asthma, uncomplicated: Secondary | ICD-10-CM | POA: Diagnosis present

## 2017-01-18 DIAGNOSIS — Z9119 Patient's noncompliance with other medical treatment and regimen: Secondary | ICD-10-CM

## 2017-01-18 DIAGNOSIS — D61818 Other pancytopenia: Secondary | ICD-10-CM | POA: Diagnosis not present

## 2017-01-18 DIAGNOSIS — D692 Other nonthrombocytopenic purpura: Secondary | ICD-10-CM | POA: Diagnosis present

## 2017-01-18 DIAGNOSIS — R590 Localized enlarged lymph nodes: Secondary | ICD-10-CM | POA: Diagnosis present

## 2017-01-18 DIAGNOSIS — G4730 Sleep apnea, unspecified: Secondary | ICD-10-CM | POA: Diagnosis present

## 2017-01-18 DIAGNOSIS — G4733 Obstructive sleep apnea (adult) (pediatric): Secondary | ICD-10-CM | POA: Diagnosis not present

## 2017-01-18 DIAGNOSIS — C8448 Peripheral T-cell lymphoma, not classified, lymph nodes of multiple sites: Secondary | ICD-10-CM | POA: Diagnosis present

## 2017-01-18 DIAGNOSIS — D696 Thrombocytopenia, unspecified: Secondary | ICD-10-CM | POA: Diagnosis not present

## 2017-01-18 DIAGNOSIS — D649 Anemia, unspecified: Secondary | ICD-10-CM

## 2017-01-18 DIAGNOSIS — E876 Hypokalemia: Secondary | ICD-10-CM | POA: Diagnosis not present

## 2017-01-18 DIAGNOSIS — R739 Hyperglycemia, unspecified: Secondary | ICD-10-CM | POA: Diagnosis not present

## 2017-01-18 DIAGNOSIS — Z8249 Family history of ischemic heart disease and other diseases of the circulatory system: Secondary | ICD-10-CM

## 2017-01-18 DIAGNOSIS — Z79899 Other long term (current) drug therapy: Secondary | ICD-10-CM | POA: Diagnosis not present

## 2017-01-18 DIAGNOSIS — T451X5A Adverse effect of antineoplastic and immunosuppressive drugs, initial encounter: Secondary | ICD-10-CM | POA: Diagnosis present

## 2017-01-18 DIAGNOSIS — D709 Neutropenia, unspecified: Secondary | ICD-10-CM | POA: Diagnosis present

## 2017-01-18 DIAGNOSIS — Z7982 Long term (current) use of aspirin: Secondary | ICD-10-CM

## 2017-01-18 DIAGNOSIS — I493 Ventricular premature depolarization: Secondary | ICD-10-CM | POA: Diagnosis present

## 2017-01-18 DIAGNOSIS — Z7952 Long term (current) use of systemic steroids: Secondary | ICD-10-CM

## 2017-01-18 DIAGNOSIS — K1230 Oral mucositis (ulcerative), unspecified: Secondary | ICD-10-CM

## 2017-01-18 DIAGNOSIS — F419 Anxiety disorder, unspecified: Secondary | ICD-10-CM | POA: Diagnosis present

## 2017-01-18 DIAGNOSIS — R5081 Fever presenting with conditions classified elsewhere: Secondary | ICD-10-CM | POA: Diagnosis present

## 2017-01-18 DIAGNOSIS — Z90710 Acquired absence of both cervix and uterus: Secondary | ICD-10-CM

## 2017-01-18 DIAGNOSIS — E860 Dehydration: Secondary | ICD-10-CM | POA: Diagnosis present

## 2017-01-18 DIAGNOSIS — Z885 Allergy status to narcotic agent status: Secondary | ICD-10-CM

## 2017-01-18 DIAGNOSIS — B370 Candidal stomatitis: Secondary | ICD-10-CM

## 2017-01-18 DIAGNOSIS — B3781 Candidal esophagitis: Secondary | ICD-10-CM | POA: Diagnosis not present

## 2017-01-18 DIAGNOSIS — B029 Zoster without complications: Secondary | ICD-10-CM | POA: Diagnosis not present

## 2017-01-18 DIAGNOSIS — Z800 Family history of malignant neoplasm of digestive organs: Secondary | ICD-10-CM

## 2017-01-18 DIAGNOSIS — D701 Agranulocytosis secondary to cancer chemotherapy: Secondary | ICD-10-CM | POA: Diagnosis not present

## 2017-01-18 HISTORY — DX: Non-Hodgkin lymphoma, unspecified, unspecified site: C85.90

## 2017-01-18 LAB — DIFFERENTIAL
BASOS ABS: 0 10*3/uL (ref 0.0–0.1)
Basophils Relative: 0 %
EOS ABS: 0 10*3/uL (ref 0.0–0.7)
Eosinophils Relative: 0 %
LYMPHS ABS: 0.1 10*3/uL — AB (ref 0.7–4.0)
Lymphocytes Relative: 82 %
MONO ABS: 0 10*3/uL — AB (ref 0.1–1.0)
Monocytes Relative: 0 %
NEUTROS PCT: 18 %
Neutro Abs: 0 10*3/uL — ABNORMAL LOW (ref 1.7–7.7)

## 2017-01-18 LAB — URINALYSIS, ROUTINE W REFLEX MICROSCOPIC
Bilirubin Urine: NEGATIVE
Glucose, UA: NEGATIVE mg/dL
Hgb urine dipstick: NEGATIVE
Ketones, ur: NEGATIVE mg/dL
Leukocytes, UA: NEGATIVE
Nitrite: NEGATIVE
Protein, ur: NEGATIVE mg/dL
Specific Gravity, Urine: 1.025 (ref 1.005–1.030)
pH: 5 (ref 5.0–8.0)

## 2017-01-18 LAB — BASIC METABOLIC PANEL
ANION GAP: 8 (ref 5–15)
BUN: 14 mg/dL (ref 6–20)
CALCIUM: 8.3 mg/dL — AB (ref 8.9–10.3)
CO2: 28 mmol/L (ref 22–32)
Chloride: 97 mmol/L — ABNORMAL LOW (ref 101–111)
Creatinine, Ser: 0.5 mg/dL (ref 0.44–1.00)
Glucose, Bld: 141 mg/dL — ABNORMAL HIGH (ref 65–99)
Potassium: 3.2 mmol/L — ABNORMAL LOW (ref 3.5–5.1)
SODIUM: 133 mmol/L — AB (ref 135–145)

## 2017-01-18 LAB — CBC
HCT: 23 % — ABNORMAL LOW (ref 36.0–46.0)
HEMOGLOBIN: 8.1 g/dL — AB (ref 12.0–15.0)
MCH: 28.8 pg (ref 26.0–34.0)
MCHC: 35.2 g/dL (ref 30.0–36.0)
MCV: 81.9 fL (ref 78.0–100.0)
Platelets: 27 10*3/uL — CL (ref 150–400)
RBC: 2.81 MIL/uL — AB (ref 3.87–5.11)
RDW: 20.5 % — ABNORMAL HIGH (ref 11.5–15.5)
WBC: 0.1 10*3/uL — AB (ref 4.0–10.5)

## 2017-01-18 LAB — TROPONIN I: TROPONIN I: 0.03 ng/mL — AB (ref <0.03)

## 2017-01-18 LAB — PROTIME-INR
INR: 1.05
PROTHROMBIN TIME: 13.8 s (ref 11.4–15.2)

## 2017-01-18 LAB — I-STAT TROPONIN, ED: TROPONIN I, POC: 0.02 ng/mL (ref 0.00–0.08)

## 2017-01-18 LAB — APTT: APTT: 27 s (ref 24–36)

## 2017-01-18 LAB — LACTIC ACID, PLASMA: LACTIC ACID, VENOUS: 2.2 mmol/L — AB (ref 0.5–1.9)

## 2017-01-18 LAB — PROCALCITONIN: PROCALCITONIN: 0.12 ng/mL

## 2017-01-18 MED ORDER — DULOXETINE HCL 30 MG PO CPEP
30.0000 mg | ORAL_CAPSULE | Freq: Every day | ORAL | Status: DC
Start: 2017-01-19 — End: 2017-01-24
  Administered 2017-01-19 – 2017-01-24 (×6): 30 mg via ORAL
  Filled 2017-01-18 (×6): qty 1

## 2017-01-18 MED ORDER — FLUCONAZOLE 100 MG PO TABS: 100.0000 mg | ORAL_TABLET | Freq: Every day | ORAL | 0 refills | Status: DC

## 2017-01-18 MED ORDER — PANTOPRAZOLE SODIUM 40 MG PO TBEC
40.0000 mg | DELAYED_RELEASE_TABLET | Freq: Every day | ORAL | Status: DC
  Administered 2017-01-19: 40 mg via ORAL
  Filled 2017-01-18: qty 1

## 2017-01-18 MED ORDER — ONDANSETRON HCL 4 MG/2ML IJ SOLN
4.0000 mg | Freq: Four times a day (QID) | INTRAMUSCULAR | Status: DC | PRN
  Administered 2017-01-19: 4 mg via INTRAVENOUS
  Filled 2017-01-18: qty 2

## 2017-01-18 MED ORDER — SODIUM CHLORIDE 0.9% FLUSH
10.0000 mL | INTRAVENOUS | Status: DC | PRN
  Administered 2017-01-19 – 2017-01-24 (×3): 10 mL
  Filled 2017-01-18 (×3): qty 40

## 2017-01-18 MED ORDER — FLUCONAZOLE 100 MG PO TABS
100.0000 mg | ORAL_TABLET | Freq: Every day | ORAL | Status: DC
  Administered 2017-01-19: 100 mg via ORAL
  Filled 2017-01-18: qty 1

## 2017-01-18 MED ORDER — ACETAMINOPHEN 325 MG PO TABS
650.0000 mg | ORAL_TABLET | Freq: Once | ORAL | Status: AC
  Administered 2017-01-18: 650 mg via ORAL
  Filled 2017-01-18: qty 2

## 2017-01-18 MED ORDER — SODIUM CHLORIDE 0.9% FLUSH
3.0000 mL | Freq: Two times a day (BID) | INTRAVENOUS | Status: DC
  Administered 2017-01-18 – 2017-01-23 (×11): 3 mL via INTRAVENOUS

## 2017-01-18 MED ORDER — DEXTROSE 5 % IV SOLN
2.0000 g | Freq: Three times a day (TID) | INTRAVENOUS | Status: DC
  Administered 2017-01-18 – 2017-01-24 (×17): 2 g via INTRAVENOUS
  Filled 2017-01-18 (×19): qty 2

## 2017-01-18 MED ORDER — MOMETASONE FURO-FORMOTEROL FUM 100-5 MCG/ACT IN AERO
2.0000 | INHALATION_SPRAY | Freq: Two times a day (BID) | RESPIRATORY_TRACT | Status: DC
  Administered 2017-01-19 – 2017-01-24 (×11): 2 via RESPIRATORY_TRACT
  Filled 2017-01-18: qty 8.8

## 2017-01-18 MED ORDER — ATENOLOL 50 MG PO TABS
50.0000 mg | ORAL_TABLET | Freq: Two times a day (BID) | ORAL | Status: DC
  Administered 2017-01-19: 50 mg via ORAL
  Filled 2017-01-18: qty 1

## 2017-01-18 MED ORDER — ACETAMINOPHEN 325 MG PO TABS
650.0000 mg | ORAL_TABLET | Freq: Four times a day (QID) | ORAL | Status: DC | PRN
  Administered 2017-01-18 – 2017-01-20 (×5): 650 mg via ORAL
  Filled 2017-01-18 (×4): qty 2

## 2017-01-18 MED ORDER — MAGIC MOUTHWASH W/LIDOCAINE: 5.0000 mL | Freq: Four times a day (QID) | ORAL | 0 refills | Status: DC | PRN

## 2017-01-18 MED ORDER — DEXAMETHASONE 4 MG PO TABS
4.0000 mg | ORAL_TABLET | Freq: Every day | ORAL | Status: DC
  Administered 2017-01-19 – 2017-01-24 (×6): 4 mg via ORAL
  Filled 2017-01-18 (×7): qty 1

## 2017-01-18 MED ORDER — MAGIC MOUTHWASH W/LIDOCAINE: 5.0000 mL | Freq: Four times a day (QID) | ORAL | Status: DC | PRN

## 2017-01-18 MED ORDER — ALPRAZOLAM 0.25 MG PO TABS
0.2500 mg | ORAL_TABLET | Freq: Two times a day (BID) | ORAL | Status: DC | PRN
  Filled 2017-01-18 (×2): qty 1

## 2017-01-18 MED ORDER — DOCUSATE SODIUM 100 MG PO CAPS
100.0000 mg | ORAL_CAPSULE | Freq: Two times a day (BID) | ORAL | Status: DC
  Administered 2017-01-19 – 2017-01-21 (×5): 100 mg via ORAL
  Filled 2017-01-18 (×8): qty 1

## 2017-01-18 MED ORDER — PROCHLORPERAZINE MALEATE 10 MG PO TABS: 10.0000 mg | ORAL_TABLET | Freq: Four times a day (QID) | ORAL | Status: DC | PRN

## 2017-01-18 MED ORDER — ACETAMINOPHEN 650 MG RE SUPP
650.0000 mg | Freq: Four times a day (QID) | RECTAL | Status: DC | PRN
  Filled 2017-01-18: qty 1

## 2017-01-18 MED ORDER — GI COCKTAIL ~~LOC~~
30.0000 mL | Freq: Once | ORAL | Status: AC
  Administered 2017-01-18: 30 mL via ORAL
  Filled 2017-01-18: qty 30

## 2017-01-18 MED ORDER — NYSTATIN 100000 UNIT/ML MT SUSP: 5.0000 mL | Freq: Four times a day (QID) | OROMUCOSAL | Status: DC | PRN

## 2017-01-18 MED ORDER — ONDANSETRON HCL 4 MG PO TABS: 4.0000 mg | ORAL_TABLET | Freq: Four times a day (QID) | ORAL | Status: DC | PRN

## 2017-01-18 NOTE — H&P (Signed)
History and Physical    Denise [name]:[id number] DOB: 1956/05/03 DOA: 01/18/2017  PCP: Denita Lung, MD Consultants:  Irene Limbo - oncology; [facility] oncology Patient coming from: Home - lives with partner; NOK: Chapman Fitch, [phone]  Chief Complaint: dysphagia  HPI: Denise [name] is a 61 y.o. 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  Patient reports that she wasn't able to swallow well and her chest started hurting today.  It started when she was trying to swallow and then hurt all the time.  Called Dr. Irene Limbo and he recommended that she come in.  Rectal temp 100.7, but no fever orally.  Minimal SOB.  No cough.  No urinary symptoms.  +chest heaviness.  Chest pressure is worse with trying to swallow.  She ate some soup, but she does not feel hungry.  No N/V/D.  Last BM was this AM and was normal.  +purpura.  She was diagnosed with cancer on Feb 14.  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  Stage IV at diagnosis.  PET/CT at diagnosis showed bulky hypermetabolic LAD throughout the neck, chest, and abdomen with suspicion for pulmonary involvement.  Repeat PET on 6/1 showed nearly completely resolved LAD in the neck, chest, abdomen, and pelvis, indicating excellent response to therapy.   ED Course: Neutropenic fever without obvious source of infection.  Oncology recommends admission with Cefepime.  Chest pain improved with GI cocktail, likely related to indigestion.  Review of Systems: As per HPI; otherwise review of systems reviewed and negative.   Ambulatory Status:  Ambulates without assistance  Past Medical History:  Diagnosis Date  . Allergy   . Asthma   . Bursitis of left hip   . Fibroids    TAH in 1999  . GERD (gastroesophageal reflux disease)   . Lymphoma ([place])    T cell  lymphoma  . PVC's (premature ventricular contractions)   . Sleep apnea    does not use CPAP    Past Surgical History:  Procedure Laterality Date  . CARDIAC CATH  05/2007   J. B[facility]Y  . HAMMER TOE SURGERY Bilateral 2016  . IR GENERIC HISTORICAL  11/05/2016   IR US GUIDE VASC ACCESS RIGHT 11/05/2016 [name], MD [facility]  . IR GENERIC HISTORICAL  11/05/2016   IR FLUORO GUIDE PORT INSERTION RIGHT 11/05/2016 [name], MD [facility]  . TOTAL ABDOMINAL HYSTERECTOMY  05/15/1998   secondary to fibroids  . WISDOM TOOTH EXTRACTION  mid teens    Social History   Social History  . Marital status: Significant Other    Spouse name: N/A  . Number of children: 0  . Years of education: N/A   Occupational History  . School Education officer, museum at [employer] History Main Topics  . Smoking status: Never Smoker  . Smokeless tobacco: Never Used  . Alcohol use 0.6 - 1.2 oz/week    1 - 2 Standard drinks or equivalent per week     Comment: only if out to dinner, maybe once a month  . Drug use: No  . Sexual activity: Yes    Partners: Female    Birth control/ protection: None   Other Topics Concern  . Not on file   Social History Narrative  . No narrative  on file    Allergies  Allergen Reactions  . Morphine And Related     Family History  Problem Relation Age of Onset  . Cancer Maternal Grandmother        GI cancer in 65's  . Dementia Mother   . Heart disease Father   . Heart failure Father     Prior to Admission medications   Medication Sig Start Date End Date Taking? Authorizing Provider  aspirin 325 MG EC tablet Take 325 mg by mouth daily as needed for pain.   Yes [provider]  atenolol (TENORMIN) 50 MG tablet TAKE 1 TABLET BY MOUTH TWICE DAILY. 11/26/16  Yes Denita Lung, MD  dexamethasone (DECADRON) 4 MG tablet TAKE 1 TABLET(4 MG) BY MOUTH DAILY WITH LUNCH 01/05/17  Yes Brunetta Genera, MD  DULoxetine (CYMBALTA)  30 MG capsule Take 1 capsule (30 mg total) by mouth daily. 10/16/16  Yes Brunetta Genera, MD  esomeprazole (NEXIUM) 40 MG capsule Take 1 capsule (40 mg total) by mouth daily before breakfast. Patient taking differently: Take 40 mg by mouth daily as needed (indigestion).  02/22/14  Yes Denita Lung, MD  hydrochlorothiazide (MICROZIDE) 12.5 MG capsule TAKE ONE CAPSULE BY MOUTH EVERY DAY 07/14/16  Yes Denita Lung, MD  ondansetron (ZOFRAN ODT) 8 MG disintegrating tablet Take 1 tablet (8 mg total) by mouth every 8 (eight) hours as needed for nausea or vomiting. 10/20/16  Yes Denita Lung, MD  ondansetron (ZOFRAN) 8 MG tablet Take 1 tablet (8 mg total) by mouth 2 (two) times daily as needed for refractory nausea / vomiting. 10/29/16  Yes Brunetta Genera, MD  acyclovir (ZOVIRAX) 400 MG tablet Take 1 tablet (400 mg total) by mouth 2 (two) times daily. Patient not taking: Reported on 01/18/2017 10/16/16   Brunetta Genera, MD  ADVAIR DISKUS 100-50 MCG/DOSE AEPB Inhale 1 puff into the lungs daily as needed (sob and wheezing).  01/15/14   [provider]  albuterol (PROVENTIL HFA;VENTOLIN HFA) 108 (90 Base) MCG/ACT inhaler Inhale 2 puffs into the lungs every 6 (six) hours as needed for wheezing or shortness of breath. 07/06/16   Denita Lung, MD  ALPRAZolam Duanne Moron) 0.25 MG tablet TAKE 1 TABLET BY MOUTH TWICE A DAY AS NEEDED FOR ANXIETY 11/03/16   Denita Lung, MD  aspirin 81 MG chewable tablet Chew 81 mg by mouth daily as needed for moderate pain.     [provider]  fluconazole (DIFLUCAN) 100 MG tablet Take 1 tablet (100 mg total) by mouth daily. Take 2 tabs on day 1 then 1 tab po daily x 12 days 01/18/17   Brunetta Genera, MD  fluticasone [facility]) 50 MCG/ACT nasal spray Place 2 sprays into both nostrils daily. Patient not taking: Reported on 01/18/2017 09/08/13   Denita Lung, MD  lidocaine-prilocaine (EMLA) cream Apply to affected area once 10/29/16   Brunetta Genera, MD  magic mouthwash w/lidocaine SOLN Take 5 mLs by mouth 4 (four) times daily as needed for mouth pain. 01/18/17   Brunetta Genera, MD  meloxicam (MOBIC) 15 MG tablet TK 1 T PO QD PRN FOR MUSCLE SPASMS 12/14/16   [provider]  nystatin (MYCOSTATIN) 100000 UNIT/ML suspension Take 5 mLs (500,000 Units total) by mouth 4 (four) times daily as needed (thrush). 11/11/16   Susanne Borders, NP  predniSONE (DELTASONE) 20 MG tablet Take 4 tablets (80 mg total) with breakfast. Take days 1-5 with chemotherapy.  Patient taking differently: Take 60 mg by mouth daily with breakfast. Take 3 tablets (60 mg total) with breakfast. Take days 1-5 with chemotherapy. 01/11/17   Brunetta Genera, MD  prochlorperazine (COMPAZINE) 10 MG tablet Take 1 tablet (10 mg total) by mouth every 6 (six) hours as needed (Nausea or vomiting). 10/29/16   Brunetta Genera, MD    Physical Exam: Vitals:   01/18/17 1744 01/18/17 2004 01/18/17 2146 01/18/17 2206  BP:  119/62 127/71 (!) 134/54  Pulse:  86 78 84  Resp:  19 (!) 24 (!) 24  Temp: (!) 100.6 F (38.1 C) 99.4 F (37.4 C) 98.8 F (37.1 C) 98.8 F (37.1 C)  TempSrc: Rectal Oral Oral Oral  SpO2:  100% 100%   Weight:    78.3 kg (172 lb 11.2 oz)  Height:    5\' 4"  (1.626 m)     General:  Appears calm and comfortable and is NAD Eyes:  PERRL, EOMI, normal lids, iris ENT:  grossly normal hearing, lips & tongue, mmm.  Specifically, thrush was not appreciated on careful inspection. Neck:  no LAD, masses or thyromegaly Cardiovascular:  RRR, no m/r/g. No LE edema.  Respiratory:  CTA bilaterally, no w/r/r. Normal respiratory effort. Abdomen:  soft, ntnd, NABS Skin:  Scattered purpuric lesions primarily on her forearms Musculoskeletal:  grossly normal tone BUE/BLE, good ROM, no bony abnormality Psychiatric:  grossly normal mood and affect, speech fluent and appropriate, AOx3 Neurologic:  CN 2-12 grossly intact, moves all extremities in coordinated  fashion, sensation intact  Labs on Admission: I have personally reviewed following labs and imaging studies  CBC:  Recent Labs Lab 01/18/17 1529  WBC 0.1*  NEUTROABS 0.0*  HGB 8.1*  HCT 23.0*  MCV 81.9  PLT 27*   Basic Metabolic Panel:  Recent Labs Lab 01/18/17 1529  NA 133*  K 3.2*  CL 97*  CO2 28  GLUCOSE 141*  BUN 14  CREATININE 0.50  CALCIUM 8.3*   GFR: Estimated Creatinine Clearance: 74.7 mL/min (by C-G formula based on SCr of 0.5 mg/dL). Liver Function Tests: No results for input(s): AST, ALT, ALKPHOS, BILITOT, PROT, ALBUMIN in the last 168 hours. No results for input(s): LIPASE, AMYLASE in the last 168 hours. No results for input(s): AMMONIA in the last 168 hours. Coagulation Profile:  Recent Labs Lab 01/18/17 2258  INR 1.05   Cardiac Enzymes:  Recent Labs Lab 01/18/17 2258  TROPONINI 0.03*   BNP (last 3 results) No results for input(s): PROBNP in the last 8760 hours. HbA1C: No results for input(s): HGBA1C in the last 72 hours. CBG: No results for input(s): GLUCAP in the last 168 hours. Lipid Profile: No results for input(s): CHOL, HDL, LDLCALC, TRIG, CHOLHDL, LDLDIRECT in the last 72 hours. Thyroid Function Tests: No results for input(s): TSH, T4TOTAL, FREET4, T3FREE, THYROIDAB in the last 72 hours. Anemia Panel: No results for input(s): VITAMINB12, FOLATE, FERRITIN, TIBC, IRON, RETICCTPCT in the last 72 hours. Urine analysis:    Component Value Date/Time   COLORURINE YELLOW 01/18/2017 1759   APPEARANCEUR CLEAR 01/18/2017 1759   LABSPEC 1.025 01/18/2017 1759   PHURINE 5.0 01/18/2017 1759   GLUCOSEU NEGATIVE 01/18/2017 1759   HGBUR NEGATIVE 01/18/2017 1759   BILIRUBINUR NEGATIVE 01/18/2017 1759   BILIRUBINUR n 09/14/2016 1003   KETONESUR NEGATIVE 01/18/2017 1759   PROTEINUR NEGATIVE 01/18/2017 1759   UROBILINOGEN negative 09/14/2016 1003   NITRITE NEGATIVE 01/18/2017 1759   LEUKOCYTESUR NEGATIVE 01/18/2017 1759    Creatinine  Clearance: Estimated  Creatinine Clearance: 74.7 mL/min (by C-G formula based on SCr of 0.5 mg/dL).  Sepsis Labs: @LABRCNTIP (procalcitonin:4,lacticidven:4) )No results found for this or any previous visit (from the past 240 hour(s)).   Radiological Exams on Admission: Dg Chest 2 View  Result Date: 01/18/2017 CLINICAL DATA:  Lymphoma, chest pain EXAM: CHEST  2 VIEW COMPARISON:  10/15/2016 FINDINGS: Heart size and vascularity normal. Negative for pneumonia. Negative for mass or effusion. Interval improvement in mediastinal and hilar adenopathy compared with the prior study. Port-A-Cath tip in the mid right atrium. IMPRESSION: No active cardiopulmonary disease. Electronically Signed   By: Franchot Gallo M.D.   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  T cell lymphoma -patient initially diagnosed with stage IV lymphoma -Appears to be having a very brisk response to chemotherapy -Some side effects are not unexpected given her aggressive (and effective!) treatment plan -Dr. Irene Limbo to see patient in AM  Hypergylcemia -Glucose 141 -May be stress response -Will follow with fasting AM labs -It is unlikely that she will need acute or chronic treatment for this issue  Chest pain  -Troponin 0.02, 0.03 -Story  is not particularly concerning for ACS -Patient has not yet had an EKG; EKG now and in AM pending -Will continue to trend troponin without further intervention at this time  Dysphagia -Patient apparently had this addressed earlier in the week, as she has a number of new medications for dysphagia on her medication list but she has not had them filled -This may be a side effect of the chemotherapy leading to esophagitis -With her neutropenia, she is not a candidate for EGD evaluation at this time regardless -Will plan to treat symptomatically and follow -She does not have obvious thrush on physical exam -Will continue Diflucan, magic mouthwash, and Nystatin swish and swallow  Pancytopenia -WBC as above -Hgb 8.1, prior 9.7 on 6/4 -Platelets 27, prior 267 on 6/4 -She does have purpura on exam -Will plan to transfuse at 10k platelets or sooner if bleeding.  OSA -No on CPAP but reports improved snoring with her 30 pound weight loss  PVCs -Continue beta blocker  DVT prophylaxis: Foot pump due to severe thrombocytopenia Code Status:  Full - confirmed with patient Family Communication: None present Disposition Plan:  Home once clinically improved Consults called: Oncology - to see in AM  Admission status: Admit - It is my clinical opinion that admission to INPATIENT is reasonable and necessary because this patient will require at least 2 midnights in the hospital to treat this condition based on the medical complexity of the problems presented.  Given the aforementioned information, the predictability of an adverse outcome is felt to be significant.    Karmen Bongo MD [facility]  If 7PM-7AM, please contact night-coverage [email or web address] Password [facility]  01/19/2017, 12:49 AM

## 2017-01-18 NOTE — Progress Notes (Addendum)
CRITICAL VALUE ALERT  Critical Value:  Lactic acid 2.2  Date & Time Notied:  01/18/2017 2336  Provider Notified: Tylene Fantasia NP @ 2337 & 0018  Orders Received/Actions taken: no orders received

## 2017-01-18 NOTE — ED Notes (Signed)
Lab stated they can process differential as an add on

## 2017-01-18 NOTE — ED Provider Notes (Signed)
[facility] DEPT Provider Note   CSN: [id number] Arrival date & time: 01/18/17  1452    History   Chief Complaint Chief Complaint  Patient presents with  . Chest Pain    HPI Denise [name] is a 61 y.o. female.  HPI   61 year old female presents today with complaints of chest pain. Patient has a significant past medical history T cell lymphoma currently under the care of Dr. Irene Limbo receiving chemotherapy most recently 5 days ago; CHOEP.  Patient notes that over the last several days she has been more fatigued than normal, she attributes this to her chemotherapy. She notes she is also had bruising along the wrists where her dog has jumped on her with some bleeding that car from very superficial wounds. Patient notes today she started developed central chest pain felt as a heaviness. She notes that she is having the sensation of acid reflux over the last several days, but the pain started today. She notes that drinking liquids make the symptoms worse. She denies any associated shortness of breath, dizziness, or referred pain. She denies abdominal pain, black or tarry stools, or blood per rectum.   Past Medical History:  Diagnosis Date  . Allergy   . Asthma   . Bursitis of left hip   . Cancer ([place])   . Fibroids    TAH in 1999  . GERD (gastroesophageal reflux disease)   . Lymphoma ([place])   . PVC's (premature ventricular contractions)   . Sleep apnea     Patient Active Problem List   Diagnosis Date Noted  . 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 Herpes zoster without complication 37/85/8850  . Family history of heart disease in female family member before age 45 07/06/2016  . Arthritis 09/06/2014  . Obesity (BMI 30-39.9) 09/06/2014  . Extrinsic asthma 08/20/2011  . Allergic rhinitis 08/20/2011  . Sleep apnea 08/20/2011  . PVC's (premature ventricular contractions) 08/20/2011  . GERD (gastroesophageal reflux disease) 08/20/2011     Past Surgical History:  Procedure Laterality Date  . CARDIAC CATH  05/2007   J. BERRY  . HAMMER TOE SURGERY Bilateral 2016  . IR GENERIC HISTORICAL  11/05/2016   IR US GUIDE VASC ACCESS RIGHT 11/05/2016 [name], MD [facility]  . IR GENERIC HISTORICAL  11/05/2016   IR FLUORO GUIDE PORT INSERTION RIGHT 11/05/2016 [name], MD [facility]  . TOTAL ABDOMINAL HYSTERECTOMY  05/15/1998   secondary to fibroids  . WISDOM TOOTH EXTRACTION  mid teens    OB History    Gravida Para Term Preterm AB Living   0 0           SAB TAB Ectopic Multiple Live Births                   Home Medications    Prior to Admission medications   Medication Sig Start Date End Date Taking? Authorizing Provider  aspirin 325 MG EC tablet Take 325 mg by mouth daily as needed for pain.   Yes [provider]  atenolol (TENORMIN) 50 MG tablet TAKE 1 TABLET BY MOUTH TWICE DAILY. 11/26/16  Yes Denita Lung, MD  dexamethasone (DECADRON) 4 MG tablet TAKE 1 TABLET(4 MG) BY MOUTH DAILY WITH LUNCH 01/05/17  Yes Brunetta Genera, MD  DULoxetine (CYMBALTA) 30 MG capsule Take 1 capsule (30 mg total) by mouth daily. 10/16/16  Yes Brunetta Genera, MD  esomeprazole (NEXIUM) 40 MG capsule Take  1 capsule (40 mg total) by mouth daily before breakfast. Patient taking differently: Take 40 mg by mouth daily as needed (indigestion).  02/22/14  Yes Denita Lung, MD  hydrochlorothiazide (MICROZIDE) 12.5 MG capsule TAKE ONE CAPSULE BY MOUTH EVERY DAY 07/14/16  Yes Denita Lung, MD  ondansetron (ZOFRAN ODT) 8 MG disintegrating tablet Take 1 tablet (8 mg total) by mouth every 8 (eight) hours as needed for nausea or vomiting. 10/20/16  Yes Denita Lung, MD  ondansetron (ZOFRAN) 8 MG tablet Take 1 tablet (8 mg total) by mouth 2 (two) times daily as needed for refractory nausea / vomiting. 10/29/16  Yes Brunetta Genera, MD  acyclovir (ZOVIRAX) 400 MG tablet Take 1 tablet (400 mg total) by mouth 2 (two) times  daily. Patient not taking: Reported on 01/18/2017 10/16/16   Brunetta Genera, MD  ADVAIR DISKUS 100-50 MCG/DOSE AEPB Inhale 1 puff into the lungs daily as needed (sob and wheezing).  01/15/14   [provider]  albuterol (PROVENTIL HFA;VENTOLIN HFA) 108 (90 Base) MCG/ACT inhaler Inhale 2 puffs into the lungs every 6 (six) hours as needed for wheezing or shortness of breath. 07/06/16   Denita Lung, MD  ALPRAZolam Duanne Moron) 0.25 MG tablet TAKE 1 TABLET BY MOUTH TWICE A DAY AS NEEDED FOR ANXIETY 11/03/16   Denita Lung, MD  aspirin 81 MG chewable tablet Chew 81 mg by mouth daily as needed for moderate pain.     [provider]  fluconazole (DIFLUCAN) 100 MG tablet Take 1 tablet (100 mg total) by mouth daily. Take 2 tabs on day 1 then 1 tab po daily x 12 days 01/18/17   Brunetta Genera, MD  fluticasone [facility]) 50 MCG/ACT nasal spray Place 2 sprays into both nostrils daily. Patient not taking: Reported on 01/18/2017 09/08/13   Denita Lung, MD  lidocaine-prilocaine (EMLA) cream Apply to affected area once 10/29/16   Brunetta Genera, MD  magic mouthwash w/lidocaine SOLN Take 5 mLs by mouth 4 (four) times daily as needed for mouth pain. 01/18/17   Brunetta Genera, MD  meloxicam (MOBIC) 15 MG tablet TK 1 T PO QD PRN FOR MUSCLE SPASMS 12/14/16   [provider]  nystatin (MYCOSTATIN) 100000 UNIT/ML suspension Take 5 mLs (500,000 Units total) by mouth 4 (four) times daily as needed (thrush). 11/11/16   Susanne Borders, NP  predniSONE (DELTASONE) 20 MG tablet Take 4 tablets (80 mg total) with breakfast. Take days 1-5 with chemotherapy. Patient taking differently: Take 60 mg by mouth daily with breakfast. Take 3 tablets (60 mg total) with breakfast. Take days 1-5 with chemotherapy. 01/11/17   Brunetta Genera, MD  prochlorperazine (COMPAZINE) 10 MG tablet Take 1 tablet (10 mg total) by mouth every 6 (six) hours as needed (Nausea or vomiting). 10/29/16   Brunetta Genera, MD    Family History Family History  Problem Relation Age of Onset  . Cancer Maternal Grandmother        GI cancer in 58's  . Dementia Mother   . Heart disease Father   . Heart failure Father     Social History Social History  Substance Use Topics  . Smoking status: Never Smoker  . Smokeless tobacco: Never Used  . Alcohol use 0.6 - 1.2 oz/week    1 - 2 Standard drinks or equivalent per week     Comment: only if out to dinner, maybe once a month     Allergies  Morphine and related   Review of Systems Review of Systems  All other systems reviewed and are negative.   Physical Exam Updated Vital Signs BP 119/62   Pulse 86   Temp 99.4 F (37.4 C) (Oral)   Resp 19   Ht 5\' 4"  (1.626 m)   Wt 78.9 kg (174 lb)   LMP 04/25/1998 (Exact Date)   SpO2 100%   BMI 29.87 kg/m   Physical Exam  Constitutional: She is oriented to person, place, and time. She appears well-developed and well-nourished.  HENT:  Head: Normocephalic and atraumatic.  Eyes: Conjunctivae are normal. Pupils are equal, round, and reactive to light. Right eye exhibits no discharge. Left eye exhibits no discharge. No scleral icterus.  Neck: Normal range of motion. No JVD present. No tracheal deviation present.  Cardiovascular: Normal rate, regular rhythm, normal heart sounds and intact distal pulses.  Exam reveals no gallop and no friction rub.   No murmur heard. Pulmonary/Chest: Effort normal and breath sounds normal. No stridor. No respiratory distress. She has no wheezes. She has no rales. She exhibits no tenderness.  Neurological: She is alert and oriented to person, place, and time. Coordination normal.  Skin: Skin is warm.  Psychiatric: She has a normal mood and affect. Her behavior is normal. Judgment and thought content normal.  Nursing note and vitals reviewed.    ED Treatments / Results  Labs (all labs ordered are listed, but only abnormal results are displayed) Labs Reviewed    BASIC METABOLIC PANEL - Abnormal; Notable for the following:       Result Value   Sodium 133 (*)    Potassium 3.2 (*)    Chloride 97 (*)    Glucose, Bld 141 (*)    Calcium 8.3 (*)    All other components within normal limits  CBC - Abnormal; Notable for the following:    WBC 0.1 (*)    RBC 2.81 (*)    Hemoglobin 8.1 (*)    HCT 23.0 (*)    RDW 20.5 (*)    Platelets 27 (*)    All other components within normal limits  DIFFERENTIAL - Abnormal; Notable for the following:    Neutro Abs 0.0 (*)    Lymphs Abs 0.1 (*)    Monocytes Absolute 0.0 (*)    All other components within normal limits  CULTURE, BLOOD (ROUTINE X 2)  CULTURE, BLOOD (ROUTINE X 2)  URINALYSIS, ROUTINE W REFLEX MICROSCOPIC  I-STAT TROPOININ, ED    EKG  EKG Interpretation None       Radiology Dg Chest 2 View  Result Date: 01/18/2017 CLINICAL DATA:  Lymphoma, chest pain EXAM: CHEST  2 VIEW COMPARISON:  10/15/2016 FINDINGS: Heart size and vascularity normal. Negative for pneumonia. Negative for mass or effusion. Interval improvement in mediastinal and hilar adenopathy compared with the prior study. Port-A-Cath tip in the mid right atrium. IMPRESSION: No active cardiopulmonary disease. Electronically Signed   By: Franchot Gallo M.D.   On: 01/18/2017 16:09    Procedures Procedures (including critical care time) \     Medications Ordered in ED Medications  ceFEPIme (MAXIPIME) 2 g in dextrose 5 % 50 mL IVPB (2 g Intravenous New Bag/Given 01/18/17 1958)  gi cocktail (Maalox,Lidocaine,Donnatal) (30 mLs Oral Given 01/18/17 1659)  acetaminophen (TYLENOL) tablet 650 mg (650 mg Oral Given 01/18/17 1728)     Initial Impression / Assessment and Plan / ED Course  I have reviewed the triage vital signs and the nursing notes.  Pertinent labs & imaging results that were available during my care of the patient were reviewed by me and considered in my medical decision making (see chart for details).      Final  Clinical Impressions(s) / ED Diagnoses   Final diagnoses:  Neutropenic fever ([place])  Chest pain, unspecified type    Labs: Blood cultures, i-STAT troponin, BMP and CBC, differential  Imaging: DG chest 2 view  Consults:  Therapeutics:  Discharge Meds:   Assessment/Plan: 61 year old female presents today with neutropenic fever. No obvious source of infection. She is well appearing in no acute distress. She has reassuring vital signs. Case was discussed with on-call medical oncologist who agreed that hospital admission, dose of cefepime would be appropriate in this patient. Patient will be admitted to the hospitalist service for ongoing management. Patient's symptoms of chest pain improved with GI cocktail, likely related to indigestion.      New Prescriptions New Prescriptions   No medications on file     Francee Gentile 01/18/17 2008    Fredia Sorrow, MD 01/20/17 [phone]

## 2017-01-18 NOTE — Progress Notes (Addendum)
CRITICAL VALUE ALERT  Critical Value:  Troponin 0.03  Date & Time Notied:  01/18/2017   2340 & 0018  Provider Notified: Tylene Fantasia NP  Orders Received/Actions taken: no orders received

## 2017-01-18 NOTE — ED Notes (Signed)
ED Provider at bedside. 

## 2017-01-18 NOTE — ED Notes (Signed)
Pt transported to 1430 via this RN. Report given at bedside to [facility], RN and pt stable upon transport.

## 2017-01-18 NOTE — ED Triage Notes (Signed)
Patient c/o mid chest pain at 100 today. Patient states she thought she was having acid reflux and has been taking Nexium. Patient states she took 324 mg Aspirin an hour ago. Patient deneis any SOB.

## 2017-01-18 NOTE — Telephone Encounter (Signed)
Documenting pt phone calls.

## 2017-01-18 NOTE — Progress Notes (Signed)
Pharmacy Antibiotic Note  Denise [name] is a 61 y.o. female with T-cell Lymphoma receiving chemo (last given 01/13/17), admitted on 01/18/2017 with Febrile Neutropenia.  Pharmacy has been consulted for Cefepime dosing.  Plan: Cefepime 2g IV q8 hr  Height: 5\' 4"  (162.6 cm) Weight: 174 lb (78.9 kg) IBW/kg (Calculated) : 54.7  Temp (24hrs), Avg:99.7 F (37.6 C), Min:98.7 F (37.1 C), Max:100.6 F (38.1 C)   Recent Labs Lab 01/18/17 1529  WBC 0.1*  CREATININE 0.50    Estimated Creatinine Clearance: 75.1 mL/min (by C-G formula based on SCr of 0.5 mg/dL).    Allergies  Allergen Reactions  . Morphine And Related     Microbiology results: 6/11 BCx: sent   Thank you for allowing pharmacy to be a part of this patient's care.  Reuel Boom, PharmD, BCPS Pager: [phone] 01/18/2017, 7:00 PM

## 2017-01-19 DIAGNOSIS — D61818 Other pancytopenia: Secondary | ICD-10-CM | POA: Diagnosis present

## 2017-01-19 DIAGNOSIS — R079 Chest pain, unspecified: Secondary | ICD-10-CM | POA: Diagnosis present

## 2017-01-19 DIAGNOSIS — R739 Hyperglycemia, unspecified: Secondary | ICD-10-CM | POA: Diagnosis present

## 2017-01-19 DIAGNOSIS — R1310 Dysphagia, unspecified: Secondary | ICD-10-CM

## 2017-01-19 LAB — CBC
HCT: 20.1 % — ABNORMAL LOW (ref 36.0–46.0)
Hemoglobin: 7.1 g/dL — ABNORMAL LOW (ref 12.0–15.0)
MCH: 28.5 pg (ref 26.0–34.0)
MCHC: 35.3 g/dL (ref 30.0–36.0)
MCV: 80.7 fL (ref 78.0–100.0)
PLATELETS: 9 10*3/uL — AB (ref 150–400)
RBC: 2.49 MIL/uL — AB (ref 3.87–5.11)
RDW: 20.1 % — AB (ref 11.5–15.5)

## 2017-01-19 LAB — CBC WITH DIFFERENTIAL/PLATELET
Band Neutrophils: 0 %
Basophils Absolute: 0 10*3/uL (ref 0.0–0.1)
Basophils Relative: 0 %
EOS ABS: 0 10*3/uL (ref 0.0–0.7)
Eosinophils Relative: 0 %
HCT: 18.1 % — ABNORMAL LOW (ref 36.0–46.0)
Hemoglobin: 6.4 g/dL — CL (ref 12.0–15.0)
LYMPHS ABS: 0 10*3/uL — AB (ref 0.7–4.0)
Lymphocytes Relative: 0 %
MCH: 28.4 pg (ref 26.0–34.0)
MCHC: 35.4 g/dL (ref 30.0–36.0)
MCV: 80.4 fL (ref 78.0–100.0)
MONOS PCT: 0 %
Monocytes Absolute: 0 10*3/uL — ABNORMAL LOW (ref 0.1–1.0)
NEUTROS PCT: 0 %
NRBC: 0 /100{WBCs}
Platelets: 20 10*3/uL — CL (ref 150–400)
RBC: 2.25 MIL/uL — ABNORMAL LOW (ref 3.87–5.11)
RDW: 20.2 % — AB (ref 11.5–15.5)

## 2017-01-19 LAB — BASIC METABOLIC PANEL
Anion gap: 8 (ref 5–15)
BUN: 11 mg/dL (ref 6–20)
CHLORIDE: 99 mmol/L — AB (ref 101–111)
CO2: 25 mmol/L (ref 22–32)
CREATININE: 0.37 mg/dL — AB (ref 0.44–1.00)
Calcium: 8.3 mg/dL — ABNORMAL LOW (ref 8.9–10.3)
Glucose, Bld: 204 mg/dL — ABNORMAL HIGH (ref 65–99)
Potassium: 4.3 mmol/L (ref 3.5–5.1)
SODIUM: 132 mmol/L — AB (ref 135–145)

## 2017-01-19 LAB — LACTIC ACID, PLASMA: Lactic Acid, Venous: 2.2 mmol/L (ref 0.5–1.9)

## 2017-01-19 LAB — PREPARE RBC (CROSSMATCH)

## 2017-01-19 LAB — TROPONIN I
Troponin I: <0.03 ng/mL (ref <0.03)
Troponin I: 0.03 ng/mL (ref <0.03)

## 2017-01-19 LAB — ABO/RH: ABO/RH(D): B POS

## 2017-01-19 MED ORDER — SODIUM CHLORIDE 0.9 % IV SOLN
Freq: Once | INTRAVENOUS | Status: AC
  Administered 2017-01-19: 17:00:00 via INTRAVENOUS

## 2017-01-19 MED ORDER — NYSTATIN 100000 UNIT/ML MT SUSP
5.0000 mL | Freq: Four times a day (QID) | OROMUCOSAL | Status: DC
  Administered 2017-01-19 – 2017-01-24 (×21): 500000 [IU] via ORAL
  Filled 2017-01-19 (×20): qty 5

## 2017-01-19 MED ORDER — SUCRALFATE 1 GM/10ML PO SUSP
1.0000 g | Freq: Three times a day (TID) | ORAL | Status: DC
  Administered 2017-01-19 – 2017-01-24 (×21): 1 g via ORAL
  Filled 2017-01-19 (×21): qty 10

## 2017-01-19 MED ORDER — PANTOPRAZOLE SODIUM 40 MG PO TBEC
40.0000 mg | DELAYED_RELEASE_TABLET | Freq: Two times a day (BID) | ORAL | Status: DC
  Administered 2017-01-19 – 2017-01-24 (×10): 40 mg via ORAL
  Filled 2017-01-19 (×10): qty 1

## 2017-01-19 MED ORDER — ATENOLOL 50 MG PO TABS
50.0000 mg | ORAL_TABLET | Freq: Every day | ORAL | Status: DC
  Administered 2017-01-20: 50 mg via ORAL
  Filled 2017-01-19: qty 1

## 2017-01-19 MED ORDER — GI COCKTAIL ~~LOC~~
30.0000 mL | Freq: Once | ORAL | Status: AC
  Administered 2017-01-19: 30 mL via ORAL
  Filled 2017-01-19: qty 30

## 2017-01-19 MED ORDER — FLUCONAZOLE 100 MG PO TABS
200.0000 mg | ORAL_TABLET | Freq: Every day | ORAL | Status: DC
  Administered 2017-01-20 – 2017-01-24 (×5): 200 mg via ORAL
  Filled 2017-01-19 (×5): qty 2

## 2017-01-19 MED ORDER — POTASSIUM CHLORIDE 10 MEQ/100ML IV SOLN
10.0000 meq | INTRAVENOUS | Status: AC
  Administered 2017-01-19 (×4): 10 meq via INTRAVENOUS
  Filled 2017-01-19 (×4): qty 100

## 2017-01-19 MED ORDER — ACETAMINOPHEN 325 MG PO TABS
650.0000 mg | ORAL_TABLET | Freq: Once | ORAL | Status: AC
  Filled 2017-01-19: qty 2

## 2017-01-19 MED ORDER — SODIUM CHLORIDE 0.9 % IV SOLN
INTRAVENOUS | Status: DC
  Administered 2017-01-19 – 2017-01-23 (×5): via INTRAVENOUS

## 2017-01-19 MED ORDER — SODIUM CHLORIDE 0.9 % IV SOLN: Freq: Once | INTRAVENOUS | Status: DC

## 2017-01-19 MED ORDER — GI COCKTAIL ~~LOC~~
30.0000 mL | Freq: Three times a day (TID) | ORAL | Status: DC | PRN
  Administered 2017-01-19 – 2017-01-24 (×10): 30 mL via ORAL
  Filled 2017-01-19 (×9): qty 30

## 2017-01-19 MED ORDER — ALUM & MAG HYDROXIDE-SIMETH 200-200-20 MG/5ML PO SUSP
30.0000 mL | ORAL | Status: DC | PRN
Start: 2017-01-19 — End: 2017-01-24
  Administered 2017-01-19 (×2): 30 mL via ORAL
  Filled 2017-01-19 (×2): qty 30

## 2017-01-19 MED ORDER — ALUM & MAG HYDROXIDE-SIMETH 200-200-20 MG/5ML PO SUSP: 15.0000 mL | ORAL | Status: DC | PRN

## 2017-01-19 NOTE — Progress Notes (Addendum)
CRITICAL VALUE ALERT  Critical Value:  Lactic acid 2.2  Date & Time Notied:  01/19/2017 0241  Provider Notified: Tylene Fantasia NP   Orders Received/Actions taken: no orders received

## 2017-01-19 NOTE — Progress Notes (Signed)
[facility] Progress Note  Patient: Denise [name]   PCP: Denita Lung, MD DOB: [birth year]   DOA: 01/18/2017   DOS: 01/19/2017   Date of Service: the patient was seen and examined on 01/19/2017  Subjective: Patient complains about chest pain as well as difficulty swallowing solids and liquids. This has been ongoing since last few days. persistent. No shortness of breath no nausea no vomiting. Passing gas. No abdominal pain. No dizziness or lightheadedness.  Brief hospital course: Pt. with PMH of T-cell lymphoma, on chemotherapy last therapy on Thursday with Neulasta, OSA; admitted on 01/18/2017, presented with complaint of Chest pain and dysphagia, was found to have neutropenic fever with suspected esophagitis. Currently further plan is continue supportive management.  Assessment and Plan: 1. Chest pain with dysphagia. Suspected erosive esophagitis versus mucositis from chemotherapy. Less likely cardiac as the pain improves with GI cocktail. Serial troponins are negative. No further workup at present from cardiac perspective. Continue Protonix, every 12 hours, Carafate added, Diflucan increased from 200 mg daily to 200 mg daily. Unable to go for EGD given the neutropenic status therefore empirically treating. GI cocktail when necessary, Mylanta has not been effective. Schedule nystatin. No abdominal pain. Changing her diet to soft diet. No obvious thrush on examination, no erythema on the back of the throat.  2. Neutropenic fever. Temperature 100.7. Currently stable. Denies having any complaints of cough or shortness of breath or abdominal pain or diarrhea or constipation or burning urination. No rash anywhere. Continue IV cefepime at present. Follow blood culture. Follow recommendation of oncology.  3. Pancytopenia. Chemotherapy-induced, platelet count less than 10 this morning, received one unit of platelet. Appropriate augmentation of platelets with 20 on repeat  CBC. Hemoglobin 6.4, will transfuse 1 unit of PRBC. Continue gentle IV hydration. Received Neulasta on 01/13/2017, discussed with on call oncologist Dr. Annamaria Boots, no indication for repeat Neupogen as it's within last 2 weeks. Continue monitoring and supportive measures. Daily CBC with differential.  4. Lymphoma. Recently completed chemotherapy regimen. Continue close monitoring at present. Awaiting recommendation of oncology for further treatment. Otherwise outpatient follow-up with oncology.  Diet: Soft diet, neutropenic precaution DVT Prophylaxis: mechanical compression device.  Advance goals of care discussion: full code  Family Communication: no family was present at bedside, at the time of interview.  Disposition:  Discharge to home.  Consultants: Oncology Procedures: 1 platelet transfusion, one PRBC transfusion  Antibiotics: Anti-infectives    Start     Dose/Rate Route Frequency Ordered Stop   01/20/17 1000  fluconazole (DIFLUCAN) tablet 200 mg    Comments:  Take 2 tabs on day 1 then 1 tab po daily x 12 days     200 mg Oral Daily 01/19/17 1002     01/19/17 1000  fluconazole (DIFLUCAN) tablet 100 mg  Status:  Discontinued    Comments:  Take 2 tabs on day 1 then 1 tab po daily x 12 days     100 mg Oral Daily 01/18/17 2207 01/19/17 1002   01/18/17 1900  ceFEPIme (MAXIPIME) 2 g in dextrose 5 % 50 mL IVPB     2 g 100 mL/hr over 30 Minutes Intravenous Every 8 hours 01/18/17 1857         Objective: Physical Exam: Vitals:   01/19/17 1350 01/19/17 1505 01/19/17 1741 01/19/17 1800  BP: 120/67 (!) 117/48 (!) 114/54 (!) 117/44  Pulse: 87 89 91 88  Resp: 16 20 18 18   Temp: 100.1 F (37.8 C) 99.1 F (37.3 C)  98.6 F (37 C) 98.7 F (37.1 C)  TempSrc: Oral Oral Oral Oral  SpO2: 98% 99% 100% 99%  Weight:      Height:        Intake/Output Summary (Last 24 hours) at 01/19/17 1820 Last data filed at 01/19/17 1800  Gross per 24 hour  Intake          1733.33 ml  Output                 0 ml  Net          1733.33 ml   Filed Weights   01/18/17 1505 01/18/17 2206  Weight: 78.9 kg (174 lb) 78.3 kg (172 lb 11.2 oz)   General: Alert, Awake and Oriented to Time, Place and Person. Appear in mild distress, affect appropriate Eyes: PERRL, Conjunctiva normal ENT: Oral Mucosa clear moist. Neck: no JVD, no Abnormal Mass Or lumps Cardiovascular: S1 and S2 Present, no Murmur, Peripheral Pulses Present Respiratory: normal respiratory effort, Bilateral Air entry equal and Decreased, no use of accessory muscle, Clear to Auscultation, no Crackles, no wheezes Abdomen: Bowel Sound present, Soft and no tenderness, no hernia Skin: no redness, no Rash, no induration Extremities: no Pedal edema, no calf tenderness Neurologic: Grossly no focal neuro deficit. Bilaterally Equal motor strength  Data Reviewed: CBC:  Recent Labs Lab 01/18/17 1529 01/19/17 0615 01/19/17 1533  WBC 0.1* <0.1* <0.1*  NEUTROABS 0.0*  --   --   HGB 8.1* 7.1* 6.4*  HCT 23.0* 20.1* 18.1*  MCV 81.9 80.7 80.4  PLT 27* 9* 20*   Basic Metabolic Panel:  Recent Labs Lab 01/18/17 1529 01/19/17 0615  NA 133* 132*  K 3.2* 4.3  CL 97* 99*  CO2 28 25  GLUCOSE 141* 204*  BUN 14 11  CREATININE 0.50 0.37*  CALCIUM 8.3* 8.3*    Liver Function Tests: No results for input(s): AST, ALT, ALKPHOS, BILITOT, PROT, ALBUMIN in the last 168 hours. No results for input(s): LIPASE, AMYLASE in the last 168 hours. No results for input(s): AMMONIA in the last 168 hours. Coagulation Profile:  Recent Labs Lab 01/18/17 2258  INR 1.05   Cardiac Enzymes:  Recent Labs Lab 01/18/17 2258 01/19/17 0615 01/19/17 1007  TROPONINI 0.03* <0.03 0.03*   BNP (last 3 results) No results for input(s): PROBNP in the last 8760 hours. CBG: No results for input(s): GLUCAP in the last 168 hours. Studies: No results found.  Scheduled Meds: . [START ON 01/20/2017] atenolol  50 mg Oral Daily  . dexamethasone  4 mg Oral QAC lunch   . docusate sodium  100 mg Oral BID  . DULoxetine  30 mg Oral Daily  . [START ON 01/20/2017] fluconazole  200 mg Oral Daily  . mometasone-formoterol  2 puff Inhalation BID  . nystatin  5 mL Oral QID  . pantoprazole  40 mg Oral BID AC  . sodium chloride flush  3 mL Intravenous Q12H  . sucralfate  1 g Oral TID WC & HS   Continuous Infusions: . sodium chloride 50 mL/hr at 01/19/17 1217  . ceFEPime (MAXIPIME) IV Stopped (01/19/17 1244)   PRN Meds: acetaminophen **OR** acetaminophen, ALPRAZolam, alum & mag hydroxide-simeth, gi cocktail, ondansetron **OR** ondansetron (ZOFRAN) IV, prochlorperazine, sodium chloride flush  Time spent: 35 minutes  Author: Berle Mull, MD [facility] Pager: [phone] 01/19/2017 6:20 PM  If 7PM-7AM, please contact night-coverage at [email or web address], password [facility]

## 2017-01-20 DIAGNOSIS — C8448 Peripheral T-cell lymphoma, not classified, lymph nodes of multiple sites: Secondary | ICD-10-CM

## 2017-01-20 DIAGNOSIS — R5081 Fever presenting with conditions classified elsewhere: Secondary | ICD-10-CM

## 2017-01-20 DIAGNOSIS — D696 Thrombocytopenia, unspecified: Secondary | ICD-10-CM

## 2017-01-20 DIAGNOSIS — D649 Anemia, unspecified: Secondary | ICD-10-CM

## 2017-01-20 DIAGNOSIS — D701 Agranulocytosis secondary to cancer chemotherapy: Secondary | ICD-10-CM

## 2017-01-20 LAB — CBC WITH DIFFERENTIAL/PLATELET
BASOS ABS: 0 10*3/uL (ref 0.0–0.1)
Basophils Relative: 0 %
EOS ABS: 0 10*3/uL (ref 0.0–0.7)
Eosinophils Relative: 0 %
HCT: 20.8 % — ABNORMAL LOW (ref 36.0–46.0)
HCT: 20.8 % — ABNORMAL LOW (ref 36.0–46.0)
HEMOGLOBIN: 7.5 g/dL — AB (ref 12.0–15.0)
Hemoglobin: 7.5 g/dL — ABNORMAL LOW (ref 12.0–15.0)
Lymphocytes Relative: 33 %
Lymphs Abs: 0.1 10*3/uL — ABNORMAL LOW (ref 0.7–4.0)
MCH: 28.7 pg (ref 26.0–34.0)
MCH: 29.1 pg (ref 26.0–34.0)
MCHC: 36.1 g/dL — AB (ref 30.0–36.0)
MCHC: 36.1 g/dL — ABNORMAL HIGH (ref 30.0–36.0)
MCV: 79.7 fL (ref 78.0–100.0)
MCV: 80.6 fL (ref 78.0–100.0)
Monocytes Absolute: 0 10*3/uL — ABNORMAL LOW (ref 0.1–1.0)
Monocytes Relative: 27 %
NEUTROS PCT: 40 %
Neutro Abs: 0.1 10*3/uL — ABNORMAL LOW (ref 1.7–7.7)
Platelets: 13 10*3/uL — CL (ref 150–400)
Platelets: 10 10*3/uL — CL (ref 150–400)
RBC: 2.61 MIL/uL — ABNORMAL LOW (ref 3.87–5.11)
RBC: 2.58 MIL/uL — AB (ref 3.87–5.11)
RDW: 18.7 % — AB (ref 11.5–15.5)
RDW: 18.7 % — ABNORMAL HIGH (ref 11.5–15.5)
WBC: 0.1 10*3/uL — AB (ref 4.0–10.5)
WBC: 0.2 10*3/uL — AB (ref 4.0–10.5)

## 2017-01-20 LAB — BASIC METABOLIC PANEL
Anion gap: 7 (ref 5–15)
BUN: 9 mg/dL (ref 6–20)
CALCIUM: 8.1 mg/dL — AB (ref 8.9–10.3)
CO2: 25 mmol/L (ref 22–32)
CREATININE: 0.36 mg/dL — AB (ref 0.44–1.00)
Chloride: 98 mmol/L — ABNORMAL LOW (ref 101–111)
GFR calc non Af Amer: >60 mL/min (ref >60)
Glucose, Bld: 195 mg/dL — ABNORMAL HIGH (ref 65–99)
Potassium: 4 mmol/L (ref 3.5–5.1)
SODIUM: 130 mmol/L — AB (ref 135–145)

## 2017-01-20 LAB — BPAM PLATELET PHERESIS
Blood Product Expiration Date: 201806132359
ISSUE DATE / TIME: 201806121322
Unit Type and Rh: 6200

## 2017-01-20 LAB — PREPARE PLATELET PHERESIS: Unit division: 0

## 2017-01-20 LAB — PROTIME-INR
INR: 1.21
PROTHROMBIN TIME: 15.4 s — AB (ref 11.4–15.2)

## 2017-01-20 LAB — MAGNESIUM: Magnesium: 1.5 mg/dL — ABNORMAL LOW (ref 1.7–2.4)

## 2017-01-20 LAB — PREPARE RBC (CROSSMATCH)

## 2017-01-20 MED ORDER — ACETAMINOPHEN 325 MG PO TABS
650.0000 mg | ORAL_TABLET | Freq: Once | ORAL | Status: AC
  Administered 2017-01-20: 650 mg via ORAL
  Filled 2017-01-20: qty 2

## 2017-01-20 MED ORDER — ATENOLOL 50 MG PO TABS
50.0000 mg | ORAL_TABLET | Freq: Two times a day (BID) | ORAL | Status: DC
Start: 2017-01-20 — End: 2017-01-24
  Administered 2017-01-20 – 2017-01-24 (×8): 50 mg via ORAL
  Filled 2017-01-20 (×8): qty 1

## 2017-01-20 MED ORDER — SODIUM CHLORIDE 0.9 % IV SOLN: 250.0000 mL | Freq: Once | INTRAVENOUS | Status: DC

## 2017-01-20 MED ORDER — HEPARIN SOD (PORK) LOCK FLUSH 100 UNIT/ML IV SOLN
250.0000 [IU] | INTRAVENOUS | Status: DC | PRN
  Filled 2017-01-20: qty 2.5

## 2017-01-20 MED ORDER — DIPHENHYDRAMINE HCL 50 MG/ML IJ SOLN
25.0000 mg | Freq: Once | INTRAMUSCULAR | Status: AC
  Administered 2017-01-20: 25 mg via INTRAVENOUS
  Filled 2017-01-20: qty 1

## 2017-01-20 MED ORDER — SODIUM CHLORIDE 0.9% FLUSH: 3.0000 mL | INTRAVENOUS | Status: DC | PRN

## 2017-01-20 MED ORDER — SODIUM CHLORIDE 0.9% FLUSH: 10.0000 mL | INTRAVENOUS | Status: DC | PRN

## 2017-01-20 MED ORDER — VALACYCLOVIR HCL 500 MG PO TABS
1000.0000 mg | ORAL_TABLET | Freq: Two times a day (BID) | ORAL | Status: DC
  Administered 2017-01-21 – 2017-01-24 (×7): 1000 mg via ORAL
  Filled 2017-01-20 (×8): qty 2

## 2017-01-20 MED ORDER — TBO-FILGRASTIM 480 MCG/0.8ML ~~LOC~~ SOSY
480.0000 ug | PREFILLED_SYRINGE | Freq: Every day | SUBCUTANEOUS | Status: DC
  Administered 2017-01-20 – 2017-01-21 (×2): 480 ug via SUBCUTANEOUS
  Filled 2017-01-20 (×2): qty 0.8

## 2017-01-20 MED ORDER — HEPARIN SOD (PORK) LOCK FLUSH 100 UNIT/ML IV SOLN
500.0000 [IU] | Freq: Every day | INTRAVENOUS | Status: DC | PRN
  Filled 2017-01-20: qty 5

## 2017-01-20 NOTE — Progress Notes (Signed)
[facility]   HEMATOLOGY/ONCOLOGY INPATIENT PROGRESS NOTE  Date of Service: 01/20/2017  Inpatient Attending: .Annita Brod, MD   SUBJECTIVE  Denise [name] was admitted to the hospital with neutropenic fevers and odynophagia with chest pain with deglutition. Esophageal pain likely related to mucositis versus candida infection versus HSV vs reflux esophagitis versus other etiology in the setting of significant neutropenia due to chemotherapy. She also had significance of dramatic anemia with a hemoglobin of 6.4 and has received 1 unit of PRBC with a hemoglobin of 7.5 currently. Still feels fatigued and has symptomatically anemia so will be transfused one additional unit of PRBC especially since she might not have nadir her counts yet. Also noted to have significant thrombocytopenia and received 1 unit of platelets. Platelet counts are 13k and given her neutropenic fever the bone would be to keep her platelet counts more than 20k. Patient has some petechiae on her upper extremities but no other overt bleeding. No other overt source of infection. Currently is on empiric cefepime for neutropenic fever. Cultures pending    OBJECTIVE:  Mild distress due to odynophagia.  PHYSICAL EXAMINATION: . Vitals:   01/19/17 2015 01/20/17 0555 01/20/17 0858 01/20/17 1545  BP: 133/65 (!) 126/55  (!) 124/52  Pulse: 92 88  81  Resp: _0 Temp: 99.4 F (37.4 C) 100.3 F (37.9 C)  98.8 F (37.1 C)  TempSrc: Oral Oral  Oral  SpO2: 97% 95% 96% 100%  Weight:      Height:       Filed Weights   01/18/17 1505 01/18/17 2206  Weight: 174 lb (78.9 kg) 172 lb 11.2 oz (78.3 kg)   .Body mass index is 29.64 kg/m.  GENERAL:alert, in no acute distress and comfortable SKIN: skin color, texture, turgor are normal, no rashes or significant lesions EYES: normal, conjunctiva are pink and non-injected, sclera clear OROPHARYNX:no exudate, no erythema and lips, buccal mucosa, and tongue normal  NECK: supple, no JVD,  thyroid normal size, non-tender, without nodularity LYMPH:  no palpable lymphadenopathy in the cervical, axillary or inguinal LUNGS: clear to auscultation with normal respiratory effort HEART: regular rate & rhythm,  no murmurs and no lower extremity edema ABDOMEN: abdomen soft, non-tender, normoactive bowel sounds  Musculoskeletal: no cyanosis of digits and no clubbing  PSYCH: alert & oriented x 3 with fluent speech NEURO: no focal motor/sensory deficits  MEDICAL HISTORY:  Past Medical History:  Diagnosis Date  . Allergy   . Asthma   . Bursitis of left hip   . Fibroids    TAH in 1999  . GERD (gastroesophageal reflux disease)   . Lymphoma ([place])    T cell lymphoma  . PVC's (premature ventricular contractions)   . Sleep apnea    does not use CPAP    SURGICAL HISTORY: Past Surgical History:  Procedure Laterality Date  . CARDIAC CATH  05/2007   J. BERRY  . HAMMER TOE SURGERY Bilateral 2016  . IR GENERIC HISTORICAL  11/05/2016   IR US GUIDE VASC ACCESS RIGHT 11/05/2016 [name], MD [facility]  . IR GENERIC HISTORICAL  11/05/2016   IR FLUORO GUIDE PORT INSERTION RIGHT 11/05/2016 [name], MD [facility]  . TOTAL ABDOMINAL HYSTERECTOMY  05/15/1998   secondary to fibroids  . WISDOM TOOTH EXTRACTION  mid teens    SOCIAL HISTORY: Social History   Social History  . Marital status: Significant Other    Spouse name: N/A  . Number of children: 0  . Years  of education: N/A   Occupational History  . School Education officer, museum at [employer] History Main Topics  . Smoking status: Never Smoker  . Smokeless tobacco: Never Used  . Alcohol use 0.6 - 1.2 oz/week    1 - 2 Standard drinks or equivalent per week     Comment: only if out to dinner, maybe once a month  . Drug use: No  . Sexual activity: Yes    Partners: Female    Birth control/ protection: None   Other Topics Concern  . Not on file   Social History Narrative  . No  narrative on file    FAMILY HISTORY: Family History  Problem Relation Age of Onset  . Cancer Maternal Grandmother        GI cancer in 43's  . Dementia Mother   . Heart disease Father   . Heart failure Father     ALLERGIES:  is allergic to morphine and related.  MEDICATIONS:  Scheduled Meds: . acetaminophen  650 mg Oral Once  . atenolol  50 mg Oral BID  . dexamethasone  4 mg Oral QAC lunch  . diphenhydrAMINE  25 mg Intravenous Once  . docusate sodium  100 mg Oral BID  . DULoxetine  30 mg Oral Daily  . fluconazole  200 mg Oral Daily  . mometasone-formoterol  2 puff Inhalation BID  . nystatin  5 mL Oral QID  . pantoprazole  40 mg Oral BID AC  . sodium chloride flush  3 mL Intravenous Q12H  . sucralfate  1 g Oral TID WC & HS  . Tbo-Filgrastim  480 mcg Subcutaneous QPC supper  . valACYclovir  1,000 mg Oral BID   Continuous Infusions: . sodium chloride 50 mL/hr at 01/20/17 1415  . ceFEPime (MAXIPIME) IV Stopped (01/20/17 1217)   PRN Meds:.acetaminophen **OR** acetaminophen, ALPRAZolam, alum & mag hydroxide-simeth, gi cocktail, ondansetron **OR** ondansetron (ZOFRAN) IV, prochlorperazine, sodium chloride flush  REVIEW OF SYSTEMS:    10 Point review of Systems was done is negative except as noted above.   LABORATORY DATA:  I have reviewed the data as listed  . CBC Latest Ref Rng & Units 01/20/2017 01/19/2017 01/19/2017  WBC 4.0 - 10.5 K/uL 0.1(LL) <0.1(LL) <0.1(LL)  Hemoglobin 12.0 - 15.0 g/dL 7.5(L) 6.4(LL) 7.1(L)  Hematocrit 36.0 - 46.0 % 20.8(L) 18.1(L) 20.1(L)  Platelets 150 - 400 K/uL 13(LL) 20(LL) 9(LL)    . CMP Latest Ref Rng & Units 01/20/2017 01/19/2017 01/18/2017  Glucose 65 - 99 mg/dL 195(H) 204(H) 141(H)  BUN 6 - 20 mg/dL _0 Creatinine 0.44 - 1.00 mg/dL 0.36(L) 0.37(L) 0.50  Sodium 135 - 145 mmol/L 130(L) 132(L) 133(L)  Potassium 3.5 - 5.1 mmol/L 4.0 4.3 3.2(L)  Chloride 101 - 111 mmol/L 98(L) 99(L) 97(L)  CO2 22 - 32 mmol/L _1 Calcium 8.9 -  10.3 mg/dL 8.1(L) 8.3(L) 8.3(L)  Total Protein 6.4 - 8.3 g/dL - - -  Total Bilirubin 0.20 - 1.20 mg/dL - - -  Alkaline Phos 40 - 150 U/L - - -  AST 5 - 34 U/L - - -  ALT 0 - 55 U/L - - -     RADIOGRAPHIC STUDIES: I have personally reviewed the radiological images as listed and agreed with the findings in the report. Dg Chest 2 View  Result Date: 01/18/2017 CLINICAL DATA:  Lymphoma, chest pain EXAM: CHEST  2 VIEW COMPARISON:  10/15/2016 FINDINGS: Heart size and  vascularity normal. Negative for pneumonia. Negative for mass or effusion. Interval improvement in mediastinal and hilar adenopathy compared with the prior study. Port-A-Cath tip in the mid right atrium. IMPRESSION: No active cardiopulmonary disease. Electronically Signed   By: Franchot Gallo M.D.   On: 01/18/2017 16:09   Nm Pet Image Restag (ps) Skull Base To Thigh  Result Date: 01/08/2017 CLINICAL DATA:  Subsequent treatment strategy for T-cell lymphoma. EXAM: NUCLEAR MEDICINE PET SKULL BASE TO THIGH TECHNIQUE: 8.3 mCi F-18 FDG was injected intravenously. Full-ring PET imaging was performed from the skull base to thigh after the radiotracer. CT data was obtained and used for attenuation correction and anatomic localization. FASTING BLOOD GLUCOSE:  Value: 139 mg/dl COMPARISON:  Multiple exams, including 10/27/2016 FINDINGS: NECK The previous extensive hypermetabolic adenopathy in the neck has completely resolved (Deauville 0). Calcification along the left posterior oropharynx, not a usual location for a sialolith, faintly accentuated metabolic activity surrounding this calcification with maximum SUV 7.4 (formerly 4.0), probably inflammatory. This is in the vicinity of the left stylopharyngeus and palatopharyngeus musculature. CHEST The previous extensive hypermetabolic thoracic adenopathy has nearly completely resolved. Adjacent to the descending thoracic aorta near the hiatus there is some indistinct nodal tissue measuring up to 8 mm in short  axis on image 97/4, maximum SUV 1.7. Previously at this site there was a 2.8 cm lymph node with maximum SUV 15.4 .Background mediastinal blood pool activity is 2.9. Accordingly this is Deauville 1, although the vast majority of the previous adenopathy is Deauville 0. Trace bilateral pleural effusions. The previous right perihilar opacity has resolved. ABDOMEN/PELVIS The extensive previous retroperitoneal and mesenteric adenopathy has resolved no hypermetabolic or pathologic adenopathy in the abdomen or pelvis is present. Suspected mild hemochromatosis with noncontrast hepatic density 75 Hounsfield units. Dextroconvex lumbar scoliosis. Uterus absent. Mild sigmoid colon diverticulosis. SKELETON No focal hypermetabolic activity to suggest skeletal metastasis. IMPRESSION: 1. Nearly completely resolved adenopathy in the neck, chest, abdomen, and pelvis, indicating excellent response to therapy. There is no residual hypermetabolic adenopathy, and in a previously large periaortic lymph node in the lower thorax, there is only some indistinct nodal tissue with activity below the mediastinal blood pool (Deauville 1). The vast majority of the previous adenopathy and right perihilar involvement is now Deauville 0. 2. Trace bilateral pleural effusions. 3. There is accentuated activity along a calcification adjacent to the left oropharynx, in the vicinity of the stylopharyngeus and palatopharyngeus musculature. This is a bit posterior to be a sialolith, and may represent a dystrophic calcification. There is some localized hypermetabolic activity around this calcification, probably due to inflammation. Electronically Signed   By: Van Clines M.D.   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   PET/CT scan shows bulky hypermetabolic lymphadenopathy throughout  the neck, chest, and abdomen. 1 cm hypermetabolic pelvic lymph node in right external iliac chain. Hypermetabolic masslike opacity in central right lung with multiple adjacent hypermetabolic nodules. This is suspicious for pulmonary involvement by lymphoma  Fatigue anorexia or night sweats-drenching, and weight loss suggest constitutional type B symptoms.  Hepatitis profile and HIV neg  Patient is s/p 4  cycles of CHOEP tolerated well thus far. PET/CT after 3 cycles of CHOEP showed excellent response to treatment.  Grade 1 fatigue Grade 2 nausea/vomiting and some dehydration after the first cycle of treatment  needing IV  anti-emetics and IVF. Much improved after adjustment of her supportive medications .  Admitted with #2 neutropenic fever - likely due to mucositis/etiology of odynophagia. No other overt primary source of infection. Significant neutropenia despite using Neulasta. Patient is day 7 post cycle 4 of chemotherapy. Hope to nadir and start bouncing her counts in the next 3-5 days. -We'll do G-CSF daily till ANC>1000 x 2 days. -Continue cefepime for lymphatic gram-negative coverage pending culture results. -on fluconazole for empiric antifungal coverage -will add Valtrex for empiric rx of HSV in a significantly immunocompromised patient.   #3 significant thrombocytopenia - in the setting of neutropenic fever and significant mucositis with increase risk of GI bleeding goal is to keep the platelets more than 20k.  -Transfuse platelets when necessary to maintain platelets more than 20 k  #4  symptomatic anemia . No overt evidence of bleeding . No hematemesis no melena or hematochezia . Given mucositis and thrombocytopenia patient is at high risk of bleeding . -Transfuse when necessary to maintain hemoglobin more than 8 .  #5 esophageal discomfort with swallowing likely due to chemotherapy related mucositis versus esophageal candidiasis versus HSV infection versus reflux  esophagitis. Plan -Continue to treat for resolution of neutropenia. -Sucralfate liquid 4 times a day. -GI cocktail when necessary -if significant uncontrolled pain might need to place on the PCI -No EGD at this time given risk of bacteremia. -Neutropenic precautions -Transfuse when necessary for hemoglobin less than 8 and platelets less than 20k -Would likely need to drop Etoposide and only do CHOP with next cycle of treatment.   #6 history of PVCs -On chronic beta blocker therapy.  #7 history of sleep apnea has been noncompliant with her CPAP use. She does have a CPAP machine after counseling has been using this regularly and has been doing a better job with this .  #5 severe anxiety - this appears somewhat improved -Has been using Xanax when necessary.  -continue on Cymbalta 30 mg by mouth daily.   I spent 30 minutes counseling the patient face to face. The total time spent in the appointment was 40 minutes and more than 50% was on counseling and direct patient cares.    Sullivan Lone MD [place] AAHIVMS [facility] Hematology/Oncology Physician [facility]  (Office):       [phone] (Work cell):  [phone] (Fax):           [phone]

## 2017-01-20 NOTE — Progress Notes (Signed)
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  Patient found to have neutropenic fever and chest pain felt to be secondary to erosive esophagitis versus mucositis from chemotherapy and unable to get endoscopy at this time due to neutropenia. Patient started on broad-spectrum antibiotics. Other infectious workup unrevealing to this date.  Patient today doing okay. Still having pain when swallowing in the mid chest. Frustrated about white count and wants to go home soon.  Assessment/Plan: Principal Problem:   Pancytopenia with Neutropenic fever ([place]): Still spiking low-grade temps although no true fever spikes. No rash. No other complaints such as cough or diarrhea or abdominal pain or dysuria. Continued on IV cefepime. Oncology following. Received Neulasta on 6/6 which should be good for 2 weeks were no oncology. Following daily CBC. 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 Oncology following.   Chest pain/dysphagia: Suspected leukocyte is versus erosive esophagitis. Possible and os could be once no longer been suppressed   Dysphagia   Pancytopenia (HCC)   Hyperglycemia   Code Status: Full   Family Communication: Left message with significant other   Disposition Plan: Discharge her for a few days once infection treated and immune system stronger    Consultants:  Oncology   Procedures:  Status post platelet transfusion 6/12   Antimicrobials:  Diflucan 6/11-present  IV cefepime 6/11-present  DVT  prophylaxis: SCDs   Objective: Vitals:   01/19/17 2015 01/20/17 0555 01/20/17 0858 01/20/17 1545  BP: 133/65 (!) 126/55  (!) 124/52  Pulse: 92 88  81  Resp: 18 18  16   Temp: 99.4 F (37.4 C) 100.3 F (37.9 C)  98.8 F (37.1 C)  TempSrc: Oral Oral  Oral  SpO2: 97% 95% 96% 100%  Weight:      Height:        Intake/Output Summary (Last 24 hours) at 01/20/17 1632 Last data filed at 01/20/17 1400  Gross per 24 hour  Intake          2376.33 ml  Output              300 ml  Net          2076.33 ml   Filed Weights   01/18/17 1505 01/18/17 2206  Weight: 78.9 kg (174 lb) 78.3 kg (172 lb 11.2 oz)    Exam:   General:  Alert and oriented 3, mildly tearful, otherwise no acute distress   HEENT: The was felt which mag, mucous members are moist  Cardiovascular: Regular rate and rhythm, S1-S2   Respiratory: Reports that her effort, otherwise clear to auscultation bilaterally, no crackles or wheezes, no labored breathing   Abdomen: Soft, nontender, non-senna, positive bowel sounds   Musculoskeletal: No clubbing or cyanosis or edema   Skin: No skin break, tears or lesions  Psychiatry: Patient is appropriate, no evidence of psychoses    Data Reviewed: CBC:  Recent Labs Lab 01/18/17 1529 01/19/17 0615 01/19/17 1533 01/20/17 0449  WBC 0.1* <0.1* <0.1* 0.1*  NEUTROABS 0.0*  --   --   --  HGB 8.1* 7.1* 6.4* 7.5*  HCT 23.0* 20.1* 18.1* 20.8*  MCV 81.9 80.7 80.4 79.7  PLT 27* 9* 20* 13*   Basic Metabolic Panel:  Recent Labs Lab 01/18/17 1529 01/19/17 0615 01/20/17 0449  NA 133* 132* 130*  K 3.2* 4.3 4.0  CL 97* 99* 98*  CO2 28 25 25   GLUCOSE 141* 204* 195*  BUN 14 11 9   CREATININE 0.50 0.37* 0.36*  CALCIUM 8.3* 8.3* 8.1*  MG  --   --  1.5*   GFR: Estimated Creatinine Clearance: 74.7 mL/min (A) (by C-G formula based on SCr of 0.36 mg/dL (L)). Liver Function Tests: No results for input(s): AST, ALT, ALKPHOS, BILITOT, PROT, ALBUMIN in the last 168 hours. No  results for input(s): LIPASE, AMYLASE in the last 168 hours. No results for input(s): AMMONIA in the last 168 hours. Coagulation Profile:  Recent Labs Lab 01/18/17 2258 01/20/17 0449  INR 1.05 1.21   Cardiac Enzymes:  Recent Labs Lab 01/18/17 2258 01/19/17 0615 01/19/17 1007  TROPONINI 0.03* <0.03 0.03*   BNP (last 3 results) No results for input(s): PROBNP in the last 8760 hours. HbA1C: No results for input(s): HGBA1C in the last 72 hours. CBG: No results for input(s): GLUCAP in the last 168 hours. Lipid Profile: No results for input(s): CHOL, HDL, LDLCALC, TRIG, CHOLHDL, LDLDIRECT in the last 72 hours. Thyroid Function Tests: No results for input(s): TSH, T4TOTAL, FREET4, T3FREE, THYROIDAB in the last 72 hours. Anemia Panel: No results for input(s): VITAMINB12, FOLATE, FERRITIN, TIBC, IRON, RETICCTPCT in the last 72 hours. Urine analysis:    Component Value Date/Time   COLORURINE YELLOW 01/18/2017 1759   APPEARANCEUR CLEAR 01/18/2017 1759   LABSPEC 1.025 01/18/2017 1759   PHURINE 5.0 01/18/2017 1759   GLUCOSEU NEGATIVE 01/18/2017 1759   HGBUR NEGATIVE 01/18/2017 1759   BILIRUBINUR NEGATIVE 01/18/2017 1759   BILIRUBINUR n 09/14/2016 1003   KETONESUR NEGATIVE 01/18/2017 1759   PROTEINUR NEGATIVE 01/18/2017 1759   UROBILINOGEN negative 09/14/2016 1003   NITRITE NEGATIVE 01/18/2017 1759   LEUKOCYTESUR NEGATIVE 01/18/2017 1759   Sepsis Labs: @LABRCNTIP (procalcitonin:4,lacticidven:4)  ) Recent Results (from the past 240 hour(s))  Blood culture (routine x 2)     Status: None (Preliminary result)   Collection Time: 01/18/17  6:30 PM  Result Value Ref Range Status   Specimen Description RIGHT ANTECUBITAL  Final   Special Requests   Final    BOTTLES DRAWN AEROBIC AND ANAEROBIC Blood Culture adequate volume   Culture   Final    NO GROWTH 2 DAYS Performed at [facility], [place] [address] [place], [place] [address]    Report Status PENDING  Incomplete  Blood  culture (routine x 2)     Status: None (Preliminary result)   Collection Time: 01/18/17  6:35 PM  Result Value Ref Range Status   Specimen Description LEFT ANTECUBITAL  Final   Special Requests   Final    BOTTLES DRAWN AEROBIC AND ANAEROBIC Blood Culture adequate volume   Culture   Final    NO GROWTH 2 DAYS Performed at [facility], [place] [address], [place], [place] [address]    Report Status PENDING  Incomplete      Studies: No results found.  Scheduled Meds: . atenolol  50 mg Oral BID  . dexamethasone  4 mg Oral QAC lunch  . docusate sodium  100 mg Oral BID  . DULoxetine  30 mg Oral Daily  . fluconazole  200 mg Oral Daily  .  mometasone-formoterol  2 puff Inhalation BID  . nystatin  5 mL Oral QID  . pantoprazole  40 mg Oral BID AC  . sodium chloride flush  3 mL Intravenous Q12H  . sucralfate  1 g Oral TID WC & HS  . Tbo-Filgrastim  480 mcg Subcutaneous QPC supper    Continuous Infusions: . sodium chloride 50 mL/hr at 01/20/17 1415  . ceFEPime (MAXIPIME) IV Stopped (01/20/17 1217)     LOS: 2 days     Annita Brod, MD [facility] Pager [phone]  If 7PM-7AM, please contact night-coverage [email or web address] Password [facility] 01/20/2017, 4:32 PM

## 2017-01-20 NOTE — Progress Notes (Signed)
Inpatient Diabetes Program Recommendations  AACE/ADA: New Consensus Statement on Inpatient Glycemic Control (2015)  Target Ranges:  Prepandial:   less than 140 mg/dL      Peak postprandial:   less than 180 mg/dL (1-2 hours)      Critically ill patients:  140 - 180 mg/dL   Results for LILE, MCCURLEY (MRN [id number]) as of 01/20/2017 10:24  Ref. Range 01/19/2017 06:15 01/20/2017 04:49  Glucose Latest Ref Range: 65 - 99 mg/dL 204 (H) 195 (H)    Admit with: Dysphagia and CP  History: T cell lymphoma      MD- Note patient receiving Decadron 4 mg daily.  Elevated lab glucose levels the last 2 days.  No History of DM noted.  Elevations likely due to steroids.  Please consider starting Novolog Sensitive Correction Scale/ SSI (0-9 units) TID AC + HS while patient getting Decadron.     --Will follow patient during hospitalization--  Wyn Quaker RN, MSN, CDE Diabetes Coordinator Inpatient Glycemic Control Team Team Pager: [phone] (8a-5p)

## 2017-01-21 DIAGNOSIS — R1310 Dysphagia, unspecified: Secondary | ICD-10-CM

## 2017-01-21 DIAGNOSIS — D709 Neutropenia, unspecified: Secondary | ICD-10-CM

## 2017-01-21 DIAGNOSIS — D61818 Other pancytopenia: Secondary | ICD-10-CM

## 2017-01-21 LAB — RETICULOCYTES
RBC.: 2.93 MIL/uL — AB (ref 3.87–5.11)
RETIC COUNT ABSOLUTE: 11.7 10*3/uL — AB (ref 19.0–186.0)
RETIC CT PCT: 0.4 % (ref 0.4–3.1)

## 2017-01-21 LAB — CBC WITH DIFFERENTIAL/PLATELET
BASOS ABS: 0 10*3/uL (ref 0.0–0.1)
BASOS PCT: 0 %
Basophils Absolute: 0 10*3/uL (ref 0.0–0.1)
Basophils Relative: 0 %
EOS ABS: 0 10*3/uL (ref 0.0–0.7)
Eosinophils Absolute: 0 10*3/uL (ref 0.0–0.7)
Eosinophils Relative: 3 %
Eosinophils Relative: 1 %
HCT: 24.2 % — ABNORMAL LOW (ref 36.0–46.0)
HEMATOCRIT: 23.7 % — AB (ref 36.0–46.0)
HEMOGLOBIN: 8.4 g/dL — AB (ref 12.0–15.0)
Hemoglobin: 8.6 g/dL — ABNORMAL LOW (ref 12.0–15.0)
LYMPHS ABS: 0.1 10*3/uL — AB (ref 0.7–4.0)
LYMPHS ABS: 0.1 10*3/uL — AB (ref 0.7–4.0)
Lymphocytes Relative: 21 %
Lymphocytes Relative: 13 %
MCH: 29.4 pg (ref 26.0–34.0)
MCH: 28.6 pg (ref 26.0–34.0)
MCHC: 35.5 g/dL (ref 30.0–36.0)
MCHC: 35.4 g/dL (ref 30.0–36.0)
MCV: 82.6 fL (ref 78.0–100.0)
MCV: 80.6 fL (ref 78.0–100.0)
Monocytes Absolute: 0.1 10*3/uL (ref 0.1–1.0)
Monocytes Absolute: 0.2 10*3/uL (ref 0.1–1.0)
Monocytes Relative: 24 %
Monocytes Relative: 18 %
NEUTROS ABS: 0.6 10*3/uL — AB (ref 1.7–7.7)
Neutro Abs: 0.1 10*3/uL — ABNORMAL LOW (ref 1.7–7.7)
Neutrophils Relative %: 52 %
Neutrophils Relative %: 68 %
PLATELETS: 27 10*3/uL — AB (ref 150–400)
PLATELETS: 23 10*3/uL — AB (ref 150–400)
RBC: 2.93 MIL/uL — AB (ref 3.87–5.11)
RBC: 2.94 MIL/uL — ABNORMAL LOW (ref 3.87–5.11)
RDW: 17.9 % — AB (ref 11.5–15.5)
RDW: 17.8 % — ABNORMAL HIGH (ref 11.5–15.5)
WBC: 0.3 10*3/uL — AB (ref 4.0–10.5)
WBC: 0.9 10*3/uL — CL (ref 4.0–10.5)

## 2017-01-21 LAB — COMPREHENSIVE METABOLIC PANEL
ALK PHOS: 53 U/L (ref 38–126)
ALT: 11 U/L — ABNORMAL LOW (ref 14–54)
ANION GAP: 6 (ref 5–15)
AST: 9 U/L — ABNORMAL LOW (ref 15–41)
Albumin: 2.5 g/dL — ABNORMAL LOW (ref 3.5–5.0)
BILIRUBIN TOTAL: 0.8 mg/dL (ref 0.3–1.2)
BUN: 7 mg/dL (ref 6–20)
CALCIUM: 7.9 mg/dL — AB (ref 8.9–10.3)
CO2: 24 mmol/L (ref 22–32)
CREATININE: 0.39 mg/dL — AB (ref 0.44–1.00)
Chloride: 101 mmol/L (ref 101–111)
GFR calc non Af Amer: >60 mL/min (ref >60)
Glucose, Bld: 135 mg/dL — ABNORMAL HIGH (ref 65–99)
Potassium: 3.7 mmol/L (ref 3.5–5.1)
SODIUM: 131 mmol/L — AB (ref 135–145)
TOTAL PROTEIN: 5 g/dL — AB (ref 6.5–8.1)

## 2017-01-21 LAB — BPAM PLATELET PHERESIS
BLOOD PRODUCT EXPIRATION DATE: 201806132359
ISSUE DATE / TIME: 201806132245
Unit Type and Rh: 5100

## 2017-01-21 LAB — PREPARE PLATELET PHERESIS: UNIT DIVISION: 0

## 2017-01-21 NOTE — Progress Notes (Signed)
Pharmacy Antibiotic Note  Denise [name] is a 61 y.o. female with T-cell Lymphoma receiving chemo (last given 01/13/17), admitted on 01/18/2017 with Febrile Neutropenia.  Pharmacy has been consulted for Cefepime dosing.  Fluconazole added, r/o mucositis  Valtrex added, r/o HSV  Granix daily from 6/13 till [place] of 1  Plan: Cefepime 2g IV q8 hr 6/12 Fluconazole >> 6/13 Valtrex po per Onc >>  Height: 5\' 4"  (162.6 cm) Weight: 172 lb 11.2 oz (78.3 kg) IBW/kg (Calculated) : 54.7  Temp (24hrs), Avg:98.7 F (37.1 C), Min:98.2 F (36.8 C), Max:99.2 F (37.3 C)   Recent Labs Lab 01/18/17 1529 01/18/17 2258 01/19/17 0205 01/19/17 0615 01/19/17 1533 01/20/17 0449 01/20/17 1816 01/21/17 0459  WBC 0.1*  --   --  <0.1* <0.1* 0.1* 0.2* 0.3*  CREATININE 0.50  --   --  0.37*  --  0.36*  --  0.39*  LATICACIDVEN  --  2.2* 2.2*  --   --   --   --   --     Estimated Creatinine Clearance: 74.7 mL/min (A) (by C-G formula based on SCr of 0.39 mg/dL (L)).    Allergies  Allergen Reactions  . Morphine And Related    Microbiology results: 6/11 BCx: ngtd  Thank you for allowing pharmacy to be a part of this patient's care.  Minda Ditto PharmD Pager [phone] 01/21/2017, 11:17 AM

## 2017-01-21 NOTE — Progress Notes (Addendum)
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  Patient found to have neutropenic fever and chest pain felt to be secondary to erosive esophagitis versus mucositis from chemotherapy and unable to get endoscopy at this time due to neutropenia. Patient started on broad-spectrum antibiotics. Other infectious workup unrevealing to this date.   Subjective   Patient seen and examined, complains of painful swallowing.   Assessment/Plan:     1. Pancytopenia with neutropenic fever- chemotherapy-induced, WBC improved to 0.3 today, platelets 27,000. Patient received Neulasta on 01/13/2017, no indication for repeat Neupogen as  received within 2 weeks as per oncology. Has been afebrile. Continue cefepime, fluconazole . Follow CBC in a.m. 2. Peripheral T-cell lymphoma- recently finished chemotherapy. Oncology is following 3. Esophagitis- suspect Candida esophagitis versus erosive esophagitis. Patient currently on GI cocktail when necessary, nystatin swish and swallow 4 times a day.     DVT prophylaxis: SCDs  Code Status: Full code  Family Communication: No family present at bedside  Disposition Plan: Pending improvement in WBC, resolution of fever   Consultants:  Oncology  Procedures:  Platelet transfusion on 01/19/2017  Continuous infusions . sodium chloride 50 mL/hr at 01/20/17 1415  . sodium chloride    . ceFEPime (MAXIPIME) IV Stopped (01/21/17 1142)      Antibiotics:   Anti-infectives    Start     Dose/Rate Route Frequency Ordered Stop   01/20/17 1745  valACYclovir (VALTREX) tablet 1,000 mg     1,000 mg Oral 2 times daily 01/20/17 1733 01/27/17 2159   01/20/17 1000   fluconazole (DIFLUCAN) tablet 200 mg    Comments:  Take 2 tabs on day 1 then 1 tab po daily x 12 days     200 mg Oral Daily 01/19/17 1002     01/19/17 1000  fluconazole (DIFLUCAN) tablet 100 mg  Status:  Discontinued    Comments:  Take 2 tabs on day 1 then 1 tab po daily x 12 days     100 mg Oral Daily 01/18/17 2207 01/19/17 1002   01/18/17 1900  ceFEPIme (MAXIPIME) 2 g in dextrose 5 % 50 mL IVPB     2 g 100 mL/hr over 30 Minutes Intravenous Every 8 hours 01/18/17 1857         Objective   Vitals:   01/21/17 0120 01/21/17 0340 01/21/17 0951 01/21/17 1410  BP: 110/62 (!) 109/54  139/73  Pulse: 82 72  78  Resp: 16 16  16   Temp: 98.5 F (36.9 C) 98.7 F (37.1 C)  97.8 F (36.6 C)  TempSrc: Oral Oral  Oral  SpO2: 98% 99% 98% 99%  Weight:      Height:        Intake/Output Summary (Last 24 hours) at 01/21/17 1532 Last data filed at 01/21/17 1000  Gross per 24 hour  Intake          1694.17 ml  Output              300 ml  Net          1394.17 ml   Filed Weights   01/18/17 1505 01/18/17 2206  Weight: 78.9 kg (174 lb) 78.3 kg (172 lb 11.2 oz)  Physical Examination:   Physical Exam: Eyes: No icterus, extraocular muscles intact  Mouth: Oral mucosa is moist, no lesions on palate,  Neck: Supple, no deformities, masses, or tenderness Lungs: Normal respiratory effort, bilateral clear to auscultation, no crackles or wheezes.  Heart: Regular rate and rhythm, S1 and S2 normal, no murmurs, rubs auscultated Abdomen: BS normoactive,soft,nondistended,non-tender to palpation,no organomegaly Extremities: No pretibial edema, no erythema, no cyanosis, no clubbing Neuro : Alert and oriented to time, place and person, No focal deficits Skin: No rashes seen on exam    Data Reviewed: I have personally reviewed following labs and imaging studies  CBG: No results for input(s): GLUCAP in the last 168 hours.  CBC:  Recent Labs Lab 01/18/17 1529 01/19/17 0615 01/19/17 1533  01/20/17 0449 01/20/17 1816 01/21/17 0459  WBC 0.1* <0.1* <0.1* 0.1* 0.2* 0.3*  NEUTROABS 0.0*  --   --   --  0.1* 0.1*  HGB 8.1* 7.1* 6.4* 7.5* 7.5* 8.6*  HCT 23.0* 20.1* 18.1* 20.8* 20.8* 24.2*  MCV 81.9 80.7 80.4 79.7 80.6 82.6  PLT 27* 9* 20* 13* 10* 27*    Basic Metabolic Panel:  Recent Labs Lab 01/18/17 1529 01/19/17 0615 01/20/17 0449 01/21/17 0459  NA 133* 132* 130* 131*  K 3.2* 4.3 4.0 3.7  CL 97* 99* 98* 101  CO2 28 25 25 24   GLUCOSE 141* 204* 195* 135*  BUN 14 11 9 7   CREATININE 0.50 0.37* 0.36* 0.39*  CALCIUM 8.3* 8.3* 8.1* 7.9*  MG  --   --  1.5*  --     Recent Results (from the past 240 hour(s))  Blood culture (routine x 2)     Status: None (Preliminary result)   Collection Time: 01/18/17  6:30 PM  Result Value Ref Range Status   Specimen Description RIGHT ANTECUBITAL  Final   Special Requests   Final    BOTTLES DRAWN AEROBIC AND ANAEROBIC Blood Culture adequate volume   Culture   Final    NO GROWTH 3 DAYS Performed at [facility], [address], [place], [place] [address]    Report Status PENDING  Incomplete  Blood culture (routine x 2)     Status: None (Preliminary result)   Collection Time: 01/18/17  6:35 PM  Result Value Ref Range Status   Specimen Description LEFT ANTECUBITAL  Final   Special Requests   Final    BOTTLES DRAWN AEROBIC AND ANAEROBIC Blood Culture adequate volume   Culture   Final    NO GROWTH 3 DAYS Performed at [facility], [place] [address], [place], [place] [address]    Report Status PENDING  Incomplete     Liver Function Tests:  Recent Labs Lab 01/21/17 0459  AST 9*  ALT 11*  ALKPHOS 53  BILITOT 0.8  PROT 5.0*  ALBUMIN 2.5*   No results for input(s): LIPASE, AMYLASE in the last 168 hours. No results for input(s): AMMONIA in the last 168 hours.  Cardiac Enzymes:  Recent Labs Lab 01/18/17 2258 01/19/17 0615 01/19/17 1007  TROPONINI 0.03* <0.03 0.03*   BNP (last 3 results)  Recent Labs   10/15/16 0946  BNP 89.0    ProBNP (last 3 results) No results for input(s): PROBNP in the last 8760 hours.    Studies: No results found.  Scheduled Meds: . atenolol  50 mg Oral BID  . dexamethasone  4 mg Oral QAC lunch  . docusate sodium  100 mg Oral BID  . DULoxetine  30 mg Oral Daily  . fluconazole  200 mg Oral Daily  . mometasone-formoterol  2 puff Inhalation BID  . nystatin  5 mL Oral QID  . pantoprazole  40 mg Oral BID AC  . sodium chloride flush  3 mL Intravenous Q12H  . sucralfate  1 g Oral TID WC & HS  . Tbo-Filgrastim  480 mcg Subcutaneous QPC supper  . valACYclovir  1,000 mg Oral BID      Time spent: 25 min  [facility] Pager [phone]. If 7PM-7AM, please contact night-coverage at [email or web address], Office  [phone]  password [email or web address] 01/21/2017, 3:32 PM  LOS: 3 days

## 2017-01-22 DIAGNOSIS — K1230 Oral mucositis (ulcerative), unspecified: Secondary | ICD-10-CM

## 2017-01-22 LAB — CBC WITH DIFFERENTIAL/PLATELET
BASOS PCT: 1 %
BASOS PCT: 0 %
Basophils Absolute: 0 10*3/uL (ref 0.0–0.1)
Basophils Absolute: 0 10*3/uL (ref 0.0–0.1)
EOS ABS: 0 10*3/uL (ref 0.0–0.7)
Eosinophils Absolute: 0 10*3/uL (ref 0.0–0.7)
Eosinophils Relative: 0 %
Eosinophils Relative: 0 %
HCT: 23 % — ABNORMAL LOW (ref 36.0–46.0)
HEMATOCRIT: 25.6 % — AB (ref 36.0–46.0)
Hemoglobin: 8.2 g/dL — ABNORMAL LOW (ref 12.0–15.0)
Hemoglobin: 9.1 g/dL — ABNORMAL LOW (ref 12.0–15.0)
Lymphocytes Relative: 12 %
Lymphocytes Relative: 7 %
Lymphs Abs: 0.2 10*3/uL — ABNORMAL LOW (ref 0.7–4.0)
Lymphs Abs: 0.3 10*3/uL — ABNORMAL LOW (ref 0.7–4.0)
MCH: 29.5 pg (ref 26.0–34.0)
MCH: 29.4 pg (ref 26.0–34.0)
MCHC: 35.7 g/dL (ref 30.0–36.0)
MCHC: 35.5 g/dL (ref 30.0–36.0)
MCV: 82.7 fL (ref 78.0–100.0)
MCV: 82.6 fL (ref 78.0–100.0)
MONO ABS: 0.3 10*3/uL (ref 0.1–1.0)
MONOS PCT: 6 %
Monocytes Absolute: 0.3 10*3/uL (ref 0.1–1.0)
Monocytes Relative: 20 %
NEUTROS ABS: 3.8 10*3/uL (ref 1.7–7.7)
NEUTROS PCT: 67 %
Neutro Abs: 0.9 10*3/uL — ABNORMAL LOW (ref 1.7–7.7)
Neutrophils Relative %: 87 %
PLATELETS: 24 10*3/uL — AB (ref 150–400)
PLATELETS: 26 10*3/uL — AB (ref 150–400)
RBC: 2.78 MIL/uL — ABNORMAL LOW (ref 3.87–5.11)
RBC: 3.1 MIL/uL — AB (ref 3.87–5.11)
RDW: 18.2 % — AB (ref 11.5–15.5)
RDW: 18 % — ABNORMAL HIGH (ref 11.5–15.5)
WBC: 1.4 10*3/uL — AB (ref 4.0–10.5)
WBC: 4.4 10*3/uL (ref 4.0–10.5)
nRBC: 2 /100 WBC — ABNORMAL HIGH

## 2017-01-22 LAB — BASIC METABOLIC PANEL
ANION GAP: 6 (ref 5–15)
BUN: 9 mg/dL (ref 6–20)
CALCIUM: 7.6 mg/dL — AB (ref 8.9–10.3)
CO2: 25 mmol/L (ref 22–32)
CREATININE: 0.55 mg/dL (ref 0.44–1.00)
Chloride: 102 mmol/L (ref 101–111)
GFR calc Af Amer: >60 mL/min (ref >60)
GLUCOSE: 142 mg/dL — AB (ref 65–99)
Potassium: 3.3 mmol/L — ABNORMAL LOW (ref 3.5–5.1)
Sodium: 133 mmol/L — ABNORMAL LOW (ref 135–145)

## 2017-01-22 LAB — BPAM RBC
BLOOD PRODUCT EXPIRATION DATE: 201807022359
BLOOD PRODUCT EXPIRATION DATE: 201807032359
ISSUE DATE / TIME: 201806121739
ISSUE DATE / TIME: 201806140051
UNIT TYPE AND RH: 7300
UNIT TYPE AND RH: 7300

## 2017-01-22 LAB — TYPE AND SCREEN
ABO/RH(D): B POS
Antibody Screen: NEGATIVE
UNIT DIVISION: 0
Unit division: 0

## 2017-01-22 LAB — RETICULOCYTES
RBC.: 2.78 MIL/uL — ABNORMAL LOW (ref 3.87–5.11)
Retic Count, Absolute: 16.7 10*3/uL — ABNORMAL LOW (ref 19.0–186.0)
Retic Ct Pct: 0.6 % (ref 0.4–3.1)

## 2017-01-22 MED ORDER — POTASSIUM CHLORIDE CRYS ER 20 MEQ PO TBCR: 20.0000 meq | EXTENDED_RELEASE_TABLET | Freq: Once | ORAL | Status: DC

## 2017-01-22 MED ORDER — POTASSIUM CHLORIDE CRYS ER 20 MEQ PO TBCR
40.0000 meq | EXTENDED_RELEASE_TABLET | Freq: Once | ORAL | Status: AC
  Administered 2017-01-22: 40 meq via ORAL
  Filled 2017-01-22: qty 2

## 2017-01-22 NOTE — Progress Notes (Signed)
Marland Kitchen   HEMATOLOGY/ONCOLOGY INPATIENT PROGRESS NOTE  Date of Service: 01/21/2017 Inpatient Attending: .Oswald Hillock, MD   SUBJECTIVE  Ms. Lueck was seen later this afternoon. She notes that she is feeling much better after the transfusion with improvement in her hemoglobin level. Notes that her odynophagia/esophageal pain has improved. WBC counts gradually starting to trend up. Her ANC is now up to 600. Platelets up to 27k after platelet transfusion. No evidence of overt bleeding. Eating better. In better spirits. No further fevers.   OBJECTIVE:  Mild distress due to odynophagia.  PHYSICAL EXAMINATION: . Vitals:   01/21/17 0340 01/21/17 0951 01/21/17 1410 01/21/17 2111  BP: (!) 109/54  139/73 123/64  Pulse: 72  78 74  Resp: '16  16 16  ' Temp: 98.7 F (37.1 C)  97.8 F (36.6 C) 98.8 F (37.1 C)  TempSrc: Oral  Oral Oral  SpO2: 99% 98% 99% 98%  Weight:      Height:       Filed Weights   01/18/17 1505 01/18/17 2206  Weight: 174 lb (78.9 kg) 172 lb 11.2 oz (78.3 kg)   .Body mass index is 29.64 kg/m.  GENERAL:alert, in no acute distress and comfortable SKIN: skin color, texture, turgor are normal, no rashes or significant lesions EYES: normal, conjunctiva are pink and non-injected, sclera clear OROPHARYNX:no exudate, no erythema and lips, buccal mucosa, and tongue normal  NECK: supple, no JVD, thyroid normal size, non-tender, without nodularity LYMPH:  no palpable lymphadenopathy in the cervical, axillary or inguinal LUNGS: clear to auscultation with normal respiratory effort HEART: regular rate & rhythm,  no murmurs and no lower extremity edema ABDOMEN: abdomen soft, non-tender, normoactive bowel sounds  Musculoskeletal: no cyanosis of digits and no clubbing  PSYCH: alert & oriented x 3 with fluent speech NEURO: no focal motor/sensory deficits  MEDICAL HISTORY:  Past Medical History:  Diagnosis Date  . Allergy   . Asthma   . Bursitis of left hip   . Fibroids    TAH  in 1999  . GERD (gastroesophageal reflux disease)   . Lymphoma ([place])    T cell lymphoma  . PVC's (premature ventricular contractions)   . Sleep apnea    does not use CPAP    SURGICAL HISTORY: Past Surgical History:  Procedure Laterality Date  . CARDIAC CATH  05/2007   J. [facility]  . HAMMER TOE SURGERY Bilateral 2016  . IR GENERIC HISTORICAL  11/05/2016   IR US GUIDE VASC ACCESS RIGHT 11/05/2016 [name], MD [facility]  . IR GENERIC HISTORICAL  11/05/2016   IR FLUORO GUIDE PORT INSERTION RIGHT 11/05/2016 [name], MD [facility]  . TOTAL ABDOMINAL HYSTERECTOMY  05/15/1998   secondary to fibroids  . WISDOM TOOTH EXTRACTION  mid teens    SOCIAL HISTORY: Social History   Social History  . Marital status: Significant Other    Spouse name: N/A  . Number of children: 0  . Years of education: N/A   Occupational History  . School Education officer, museum at [employer] History Main Topics  . Smoking status: Never Smoker  . Smokeless tobacco: Never Used  . Alcohol use 0.6 - 1.2 oz/week    1 - 2 Standard drinks or equivalent per week     Comment: only if out to dinner, maybe once a month  . Drug use: No  . Sexual activity: Yes    Partners: Female    Birth control/ protection: None  Other Topics Concern  . Not on file   Social History Narrative  . No narrative on file    FAMILY HISTORY: Family History  Problem Relation Age of Onset  . Cancer Maternal Grandmother        GI cancer in 1's  . Dementia Mother   . Heart disease Father   . Heart failure Father     ALLERGIES:  is allergic to morphine and related.  MEDICATIONS:  Scheduled Meds: . atenolol  50 mg Oral BID  . dexamethasone  4 mg Oral QAC lunch  . docusate sodium  100 mg Oral BID  . DULoxetine  30 mg Oral Daily  . fluconazole  200 mg Oral Daily  . mometasone-formoterol  2 puff Inhalation BID  . nystatin  5 mL Oral QID  . pantoprazole  40 mg Oral BID AC  . sodium  chloride flush  3 mL Intravenous Q12H  . sucralfate  1 g Oral TID WC & HS  . Tbo-Filgrastim  480 mcg Subcutaneous QPC supper  . valACYclovir  1,000 mg Oral BID   Continuous Infusions: . sodium chloride 50 mL/hr at 01/20/17 1415  . sodium chloride    . ceFEPime (MAXIPIME) IV Stopped (01/21/17 2147)   PRN Meds:.acetaminophen **OR** acetaminophen, ALPRAZolam, alum & mag hydroxide-simeth, gi cocktail, heparin lock flush, heparin lock flush, ondansetron **OR** ondansetron (ZOFRAN) IV, prochlorperazine, sodium chloride flush, sodium chloride flush, sodium chloride flush  REVIEW OF SYSTEMS:    10 Point review of Systems was done is negative except as noted above.   LABORATORY DATA:  I have reviewed the data as listed  . CBC Latest Ref Rng & Units 01/21/2017 01/21/2017 01/20/2017  WBC 4.0 - 10.5 K/uL 0.9(LL) 0.3(LL) 0.2(LL)  Hemoglobin 12.0 - 15.0 g/dL 8.4(L) 8.6(L) 7.5(L)  Hematocrit 36.0 - 46.0 % 23.7(L) 24.2(L) 20.8(L)  Platelets 150 - 400 K/uL 23(LL) 27(LL) 10(LL)   ANC 600  . CMP Latest Ref Rng & Units 01/21/2017 01/20/2017 01/19/2017  Glucose 65 - 99 mg/dL 135(H) 195(H) 204(H)  BUN 6 - 20 mg/dL '7 9 11  ' Creatinine 0.44 - 1.00 mg/dL 0.39(L) 0.36(L) 0.37(L)  Sodium 135 - 145 mmol/L 131(L) 130(L) 132(L)  Potassium 3.5 - 5.1 mmol/L 3.7 4.0 4.3  Chloride 101 - 111 mmol/L 101 98(L) 99(L)  CO2 22 - 32 mmol/L '24 25 25  ' Calcium 8.9 - 10.3 mg/dL 7.9(L) 8.1(L) 8.3(L)  Total Protein 6.5 - 8.1 g/dL 5.0(L) - -  Total Bilirubin 0.3 - 1.2 mg/dL 0.8 - -  Alkaline Phos 38 - 126 U/L 53 - -  AST 15 - 41 U/L 9(L) - -  ALT 14 - 54 U/L 11(L) - -     RADIOGRAPHIC STUDIES: I have personally reviewed the radiological images as listed and agreed with the findings in the report. Dg Chest 2 View  Result Date: 01/18/2017 CLINICAL DATA:  Lymphoma, chest pain EXAM: CHEST  2 VIEW COMPARISON:  10/15/2016 FINDINGS: Heart size and vascularity normal. Negative for pneumonia. Negative for mass or effusion.  Interval improvement in mediastinal and hilar adenopathy compared with the prior study. Port-A-Cath tip in the mid right atrium. IMPRESSION: No active cardiopulmonary disease. Electronically Signed   By: Franchot Gallo M.D.   On: 01/18/2017 16:09   Nm Pet Image Restag (ps) Skull Base To Thigh  Result Date: 01/08/2017 CLINICAL DATA:  Subsequent treatment strategy for T-cell lymphoma. EXAM: NUCLEAR MEDICINE PET SKULL BASE TO THIGH TECHNIQUE: 8.3 mCi F-18 FDG was injected intravenously. Full-ring PET  imaging was performed from the skull base to thigh after the radiotracer. CT data was obtained and used for attenuation correction and anatomic localization. FASTING BLOOD GLUCOSE:  Value: 139 mg/dl COMPARISON:  Multiple exams, including 10/27/2016 FINDINGS: NECK The previous extensive hypermetabolic adenopathy in the neck has completely resolved (Deauville 0). Calcification along the left posterior oropharynx, not a usual location for a sialolith, faintly accentuated metabolic activity surrounding this calcification with maximum SUV 7.4 (formerly 4.0), probably inflammatory. This is in the vicinity of the left stylopharyngeus and palatopharyngeus musculature. CHEST The previous extensive hypermetabolic thoracic adenopathy has nearly completely resolved. Adjacent to the descending thoracic aorta near the hiatus there is some indistinct nodal tissue measuring up to 8 mm in short axis on image 97/4, maximum SUV 1.7. Previously at this site there was a 2.8 cm lymph node with maximum SUV 15.4 .Background mediastinal blood pool activity is 2.9. Accordingly this is Deauville 1, although the vast majority of the previous adenopathy is Deauville 0. Trace bilateral pleural effusions. The previous right perihilar opacity has resolved. ABDOMEN/PELVIS The extensive previous retroperitoneal and mesenteric adenopathy has resolved no hypermetabolic or pathologic adenopathy in the abdomen or pelvis is present. Suspected mild  hemochromatosis with noncontrast hepatic density 75 Hounsfield units. Dextroconvex lumbar scoliosis. Uterus absent. Mild sigmoid colon diverticulosis. SKELETON No focal hypermetabolic activity to suggest skeletal metastasis. IMPRESSION: 1. Nearly completely resolved adenopathy in the neck, chest, abdomen, and pelvis, indicating excellent response to therapy. There is no residual hypermetabolic adenopathy, and in a previously large periaortic lymph node in the lower thorax, there is only some indistinct nodal tissue with activity below the mediastinal blood pool (Deauville 1). The vast majority of the previous adenopathy and right perihilar involvement is now Deauville 0. 2. Trace bilateral pleural effusions. 3. There is accentuated activity along a calcification adjacent to the left oropharynx, in the vicinity of the stylopharyngeus and palatopharyngeus musculature. This is a bit posterior to be a sialolith, and may represent a dystrophic calcification. There is some localized hypermetabolic activity around this calcification, probably due to inflammation. Electronically Signed   By: Van Clines M.D.   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   PET/CT scan shows bulky hypermetabolic lymphadenopathy throughout the neck, chest, and abdomen. 1 cm hypermetabolic pelvic lymph node in right external iliac chain. Hypermetabolic masslike opacity in central right lung with multiple adjacent hypermetabolic nodules. This is suspicious for pulmonary involvement by lymphoma  Fatigue anorexia or night sweats-drenching, and weight loss suggest constitutional type B symptoms.  Hepatitis profile and HIV neg  Patient is s/p 4  cycles of CHOEP tolerated well thus far. PET/CT after 3 cycles of CHOEP showed excellent response to treatment.  Grade 1  fatigue Grade 2 nausea/vomiting and some dehydration after the first cycle of treatment  needing IV anti-emetics and IVF. Much improved after adjustment of her supportive medications .  Admitted with #2 neutropenic fever - likely due to mucositis/etiology of odynophagia. No other overt primary source of infection. Significant neutropenia despite using Neulasta. Patient is day 8 post cycle 4 of chemotherapy. ANC has increased from 100-->600 and should be uptrending. Plan -We'll do G-CSF daily till ANC>1000 and then hold G-CSF  -Continue cefepime for empiric gram-negative coverage pending culture results. -on fluconazole for empiric antifungal coverage -continue Valtrex for empiric rx of HSV in  a significantly immunocompromised patient.   #3 significant thrombocytopenia - in the setting of neutropenic fever and significant mucositis with increase risk of GI bleeding goal is to keep the platelets more than 20k.  -Transfuse platelets when necessary to maintain platelets more than 20 k  #4  symptomatic anemia . No overt evidence of bleeding . No hematemesis no melena or hematochezia . Given mucositis and thrombocytopenia patient is at high risk of bleeding . -Transfuse when necessary to maintain hemoglobin more than 8 .  #5 esophageal discomfort with swallowing likely due to chemotherapy related mucositis versus esophageal candidiasis versus HSV infection versus reflux esophagitis. Plan -Continue to monitor for resolution of neutropenia. -Sucralfate liquid 4 times a day. -GI cocktail when necessary -if significant uncontrolled pain might need to place on the PCI -No EGD at this time given risk of bacteremia. -Neutropenic precautions -Transfuse when necessary for hemoglobin less than 8 and platelets less than 20k -Would likely need to drop Etoposide and only do CHOP with next cycle of treatment. -hold granix if ANC >1000 tomorrow AM   #6 history of PVCs -On chronic beta blocker  therapy.  #7 history of sleep apnea has been noncompliant with her CPAP use. She does have a CPAP machine after counseling has been using this regularly and has been doing a better job with this .  #5 severe anxiety - this appears somewhat improved -Has been using Xanax when necessary.  -continue on Cymbalta 30 mg by mouth daily.   I spent 25 minutes counseling the patient face to face. The total time spent in the appointment was 35 minutes and more than 50% was on counseling and direct patient cares.    Sullivan Lone MD [place] AAHIVMS [facility] Hematology/Oncology Physician [facility]  (Office):       [phone] (Work cell):  [phone] (Fax):           [phone]

## 2017-01-22 NOTE — Progress Notes (Addendum)
[facility]  PROGRESS NOTE  Denise [name] PVV:[id number] DOB: [birth year] DOA: 01/18/2017 PCP: Denita Lung, MD   Brief HPI:    61 year old female with past mental history of T-cell lymphoma receiving ongoing chemotherapy with last treatment on 6/7 who presented to the emergency room on 6/11 night with complaints of chest pain and dysphagia.  Patient found to have neutropenic fever and chest pain felt to be secondary to erosive esophagitis versus mucositis from chemotherapy and unable to get endoscopy at this time due to neutropenia. Patient started on broad-spectrum antibiotics. Other infectious workup unrevealing to this date.   Subjective   Patient seen and examined, still has intermittent difficulty swallowing. WBC slowly improving. Today WBC is 1.4. ANC 900   Assessment/Plan:     1. Pancytopenia with neutropenic fever- chemotherapy-induced, WBC improved to 1.4  today, platelets 24,000. Patient  weeks as per oncology. Has been afebrile. Continue cefepime, fluconazole, Valtrex . Follow CBC in a.m. Granix discontinued by oncology. 2. Peripheral T-cell lymphoma- recently finished chemotherapy. Oncology is following 3. Esophagitis- suspect Candida esophagitis versus erosive esophagitis. Patient currently on GI cocktail when necessary, nystatin swish and swallow 4 times a day. 4. Hypokalemia- replace  potassium and check BMP in a.m.     DVT prophylaxis: SCDs  Code Status: Full code  Family Communication: No family present at bedside  Disposition Plan: Pending improvement in WBC, resolution of fever   Consultants:  Oncology  Procedures:  Platelet transfusion on 01/19/2017  Continuous infusions . sodium chloride 50 mL/hr at 01/20/17 1415  . sodium chloride    . ceFEPime (MAXIPIME) IV Stopped (01/22/17 1217)      Antibiotics:   Anti-infectives    Start     Dose/Rate Route Frequency Ordered Stop   01/20/17 1745  valACYclovir (VALTREX) tablet 1,000 mg      1,000 mg Oral 2 times daily 01/20/17 1733 01/27/17 2159   01/20/17 1000  fluconazole (DIFLUCAN) tablet 200 mg    Comments:  Take 2 tabs on day 1 then 1 tab po daily x 12 days     200 mg Oral Daily 01/19/17 1002     01/19/17 1000  fluconazole (DIFLUCAN) tablet 100 mg  Status:  Discontinued    Comments:  Take 2 tabs on day 1 then 1 tab po daily x 12 days     100 mg Oral Daily 01/18/17 2207 01/19/17 1002   01/18/17 1900  ceFEPIme (MAXIPIME) 2 g in dextrose 5 % 50 mL IVPB     2 g 100 mL/hr over 30 Minutes Intravenous Every 8 hours 01/18/17 1857         Objective   Vitals:   01/21/17 2111 01/22/17 0605 01/22/17 1022 01/22/17 1024  BP: 123/64 (!) 143/84    Pulse: 74 77    Resp: 16 18    Temp: 98.8 F (37.1 C) 98.3 F (36.8 C)    TempSrc: Oral Oral    SpO2: 98% 97% 96% 96%  Weight:      Height:        Intake/Output Summary (Last 24 hours) at 01/22/17 1307 Last data filed at 01/22/17 1200  Gross per 24 hour  Intake             1987 ml  Output              400 ml  Net             1587 ml   Filed Weights   01/18/17  1505 01/18/17 2206  Weight: 78.9 kg (174 lb) 78.3 kg (172 lb 11.2 oz)     Physical Examination:   Eyes: No icterus, extraocular muscles intact  Mouth: Oral mucosa is moist, no lesions on palate,  Neck: Supple, no deformities, masses, or tenderness Lungs: Normal respiratory effort, bilateral clear to auscultation, no crackles or wheezes.  Heart: Regular rate and rhythm, S1 and S2 normal, no murmurs, rubs auscultated Abdomen: BS normoactive,soft,nondistended,non-tender to palpation,no organomegaly Extremities: No pretibial edema, no erythema, no cyanosis, no clubbing Neuro : Alert and oriented to time, place and person, No focal deficits Skin: No rashes seen on exam   Data Reviewed: I have personally reviewed following labs and imaging studies  CBG: No results for input(s): GLUCAP in the last 168 hours.  CBC:  Recent Labs Lab 01/18/17 1529   01/20/17 0449 01/20/17 1816 01/21/17 0459 01/21/17 1740 01/22/17 0304  WBC 0.1*  < > 0.1* 0.2* 0.3* 0.9* 1.4*  NEUTROABS 0.0*  --   --  0.1* 0.1* 0.6* 0.9*  HGB 8.1*  < > 7.5* 7.5* 8.6* 8.4* 8.2*  HCT 23.0*  < > 20.8* 20.8* 24.2* 23.7* 23.0*  MCV 81.9  < > 79.7 80.6 82.6 80.6 82.7  PLT 27*  < > 13* 10* 27* 23* 24*  < > = values in this interval not displayed.  Basic Metabolic Panel:  Recent Labs Lab 01/18/17 1529 01/19/17 0615 01/20/17 0449 01/21/17 0459 01/22/17 0304  NA 133* 132* 130* 131* 133*  K 3.2* 4.3 4.0 3.7 3.3*  CL 97* 99* 98* 101 102  CO2 28 25 25 24 25   GLUCOSE 141* 204* 195* 135* 142*  BUN 14 11 9 7 9   CREATININE 0.50 0.37* 0.36* 0.39* 0.55  CALCIUM 8.3* 8.3* 8.1* 7.9* 7.6*  MG  --   --  1.5*  --   --     Recent Results (from the past 240 hour(s))  Blood culture (routine x 2)     Status: None (Preliminary result)   Collection Time: 01/18/17  6:30 PM  Result Value Ref Range Status   Specimen Description RIGHT ANTECUBITAL  Final   Special Requests   Final    BOTTLES DRAWN AEROBIC AND ANAEROBIC Blood Culture adequate volume   Culture   Final    NO GROWTH 4 DAYS Performed at [facility], [address], [place], [place] [address]    Report Status PENDING  Incomplete  Blood culture (routine x 2)     Status: None (Preliminary result)   Collection Time: 01/18/17  6:35 PM  Result Value Ref Range Status   Specimen Description LEFT ANTECUBITAL  Final   Special Requests   Final    BOTTLES DRAWN AEROBIC AND ANAEROBIC Blood Culture adequate volume   Culture   Final    NO GROWTH 4 DAYS Performed at [facility], [place] [address] [place], [place] [address]    Report Status PENDING  Incomplete     Liver Function Tests:  Recent Labs Lab 01/21/17 0459  AST 9*  ALT 11*  ALKPHOS 53  BILITOT 0.8  PROT 5.0*  ALBUMIN 2.5*   No results for input(s): LIPASE, AMYLASE in the last 168 hours. No results for input(s): AMMONIA in the last 168  hours.  Cardiac Enzymes:  Recent Labs Lab 01/18/17 2258 01/19/17 0615 01/19/17 1007  TROPONINI 0.03* <0.03 0.03*   BNP (last 3 results)  Recent Labs  10/15/16 0946  BNP 89.0    ProBNP (  last 3 results) No results for input(s): PROBNP in the last 8760 hours.    Studies: No results found.  Scheduled Meds: . atenolol  50 mg Oral BID  . dexamethasone  4 mg Oral QAC lunch  . docusate sodium  100 mg Oral BID  . DULoxetine  30 mg Oral Daily  . fluconazole  200 mg Oral Daily  . mometasone-formoterol  2 puff Inhalation BID  . nystatin  5 mL Oral QID  . pantoprazole  40 mg Oral BID AC  . sodium chloride flush  3 mL Intravenous Q12H  . sucralfate  1 g Oral TID WC & HS  . valACYclovir  1,000 mg Oral BID      Time spent: 25 min  [facility] Pager [phone]. If 7PM-7AM, please contact night-coverage at [email or web address], Office  [phone]  password [email or web address] 01/22/2017, 1:07 PM  LOS: 4 days

## 2017-01-23 LAB — CBC WITH DIFFERENTIAL/PLATELET
BASOS ABS: 0 10*3/uL (ref 0.0–0.1)
BASOS PCT: 0 %
EOS ABS: 0 10*3/uL (ref 0.0–0.7)
Eosinophils Relative: 0 %
HEMATOCRIT: 24.7 % — AB (ref 36.0–46.0)
Hemoglobin: 8.8 g/dL — ABNORMAL LOW (ref 12.0–15.0)
Lymphocytes Relative: 7 %
Lymphs Abs: 0.4 10*3/uL — ABNORMAL LOW (ref 0.7–4.0)
MCH: 29.5 pg (ref 26.0–34.0)
MCHC: 35.6 g/dL (ref 30.0–36.0)
MCV: 82.9 fL (ref 78.0–100.0)
MONO ABS: 0.6 10*3/uL (ref 0.1–1.0)
Monocytes Relative: 9 %
NEUTROS ABS: 5.2 10*3/uL (ref 1.7–7.7)
Neutrophils Relative %: 84 %
PLATELETS: 33 10*3/uL — AB (ref 150–400)
RBC: 2.98 MIL/uL — ABNORMAL LOW (ref 3.87–5.11)
RDW: 17.9 % — AB (ref 11.5–15.5)
WBC: 6.2 10*3/uL (ref 4.0–10.5)

## 2017-01-23 LAB — CULTURE, BLOOD (ROUTINE X 2)
Culture: NO GROWTH
Culture: NO GROWTH
Special Requests: ADEQUATE
Special Requests: ADEQUATE

## 2017-01-23 LAB — RETICULOCYTES
RBC.: 2.98 MIL/uL — ABNORMAL LOW (ref 3.87–5.11)
RETIC COUNT ABSOLUTE: 38.7 10*3/uL (ref 19.0–186.0)
Retic Ct Pct: 1.3 % (ref 0.4–3.1)

## 2017-01-23 LAB — BASIC METABOLIC PANEL
ANION GAP: 8 (ref 5–15)
BUN: 6 mg/dL (ref 6–20)
CALCIUM: 7.9 mg/dL — AB (ref 8.9–10.3)
CO2: 23 mmol/L (ref 22–32)
Chloride: 103 mmol/L (ref 101–111)
Creatinine, Ser: 0.42 mg/dL — ABNORMAL LOW (ref 0.44–1.00)
GFR calc Af Amer: >60 mL/min (ref >60)
GFR calc non Af Amer: >60 mL/min (ref >60)
GLUCOSE: 180 mg/dL — AB (ref 65–99)
Potassium: 3.5 mmol/L (ref 3.5–5.1)
Sodium: 134 mmol/L — ABNORMAL LOW (ref 135–145)

## 2017-01-23 NOTE — Progress Notes (Signed)
.   HEMATOLOGY/ONCOLOGY INPATIENT PROGRESS NOTE  Date of Service: 01/22/2017  Inpatient Attending: .Lama, Gagan S, MD   SUBJECTIVE  Denise [name] was seen later this afternoon. She notes that she continues to feel better . WBC counts improving. Esophageal pain improving. No further fevers. Granix held with resolving neutropenia. No fevers today.  OBJECTIVE:  Mild distress due to odynophagia.  PHYSICAL EXAMINATION: ..BP 117/62 (BP Location: Right Arm)   Pulse 73   Temp 98.3 F (36.8 C) (Oral)   Resp 18   Ht 5' 4" (1.626 m)   Wt 172 lb 11.2 oz (78.3 kg)   LMP 04/25/1998 (Exact Date)   SpO2 96%   BMI 29.64 kg/m  .Body mass index is 29.64 kg/m.  GENERAL:alert, in no acute distress and comfortable SKIN: skin color, texture, turgor are normal, no rashes or significant lesions EYES: normal, conjunctiva are pink and non-injected, sclera clear OROPHARYNX:no exudate, no erythema and lips, buccal mucosa, and tongue normal  NECK: supple, no JVD, thyroid normal size, non-tender, without nodularity LYMPH:  no palpable lymphadenopathy in the cervical, axillary or inguinal LUNGS: clear to auscultation with normal respiratory effort HEART: regular rate & rhythm,  no murmurs and no lower extremity edema ABDOMEN: abdomen soft, non-tender, normoactive bowel sounds  Musculoskeletal: no cyanosis of digits and no clubbing  PSYCH: alert & oriented x 3 with fluent speech NEURO: no focal motor/sensory deficits  MEDICAL HISTORY:  Past Medical History:  Diagnosis Date  . Allergy   . Asthma   . Bursitis of left hip   . Fibroids    TAH in 1999  . GERD (gastroesophageal reflux disease)   . Lymphoma (HCC)    T cell lymphoma  . PVC's (premature ventricular contractions)   . Sleep apnea    does not use CPAP    SURGICAL HISTORY: Past Surgical History:  Procedure Laterality Date  . CARDIAC CATH  05/2007   J. [facility]Y  . HAMMER TOE SURGERY Bilateral 2016  . IR GENERIC HISTORICAL  11/05/2016   IR US GUIDE VASC ACCESS RIGHT 11/05/2016 Arthur Hoss, MD [facility]  . IR GENERIC HISTORICAL  11/05/2016   IR FLUORO GUIDE PORT INSERTION RIGHT 11/05/2016 Arthur Hoss, MD [facility]  . TOTAL ABDOMINAL HYSTERECTOMY  05/15/1998   secondary to fibroids  . WISDOM TOOTH EXTRACTION  mid teens    SOCIAL HISTORY: Social History   Social History  . Marital status: Significant Other    Spouse name: N/A  . Number of children: 0  . Years of education: N/A   Occupational History  . School social worker at [employer]   Social History Main Topics  . Smoking status: Never Smoker  . Smokeless tobacco: Never Used  . Alcohol use 0.6 - 1.2 oz/week    1 - 2 Standard drinks or equivalent per week     Comment: only if out to dinner, maybe once a month  . Drug use: No  . Sexual activity: Yes    Partners: Female    Birth control/ protection: None   Other Topics Concern  . Not on file   Social History Narrative  . No narrative on file    FAMILY HISTORY: Family History  Problem Relation Age of Onset  . Cancer Maternal Grandmother        GI cancer in 70's  . Dementia Mother   . Heart disease Father   . Heart failure Father     ALLERGIES:  is allergic to   GI cancer in 70's  . Dementia Mother   . Heart disease Father   . Heart failure Father     ALLERGIES:  is allergic to morphine and related.  MEDICATIONS:  Scheduled Meds: . atenolol  50 mg Oral BID  . dexamethasone  4 mg Oral QAC lunch  . docusate sodium  100 mg Oral BID  . DULoxetine  30 mg Oral Daily  . fluconazole  200 mg Oral Daily  . mometasone-formoterol  2 puff Inhalation BID  . nystatin  5 mL Oral QID  . pantoprazole  40 mg Oral BID AC  . sodium chloride flush  3 mL Intravenous Q12H  . sucralfate  1 g Oral TID WC & HS  . Tbo-Filgrastim  480 mcg Subcutaneous QPC supper  . valACYclovir  1,000 mg Oral BID   Continuous Infusions: . sodium chloride 50 mL/hr at 01/20/17 1415  . sodium chloride    . ceFEPime  (MAXIPIME) IV Stopped (01/21/17 2147)   PRN Meds:.acetaminophen **OR** acetaminophen, ALPRAZolam, alum & mag hydroxide-simeth, gi cocktail, heparin lock flush, heparin lock flush, ondansetron **OR** ondansetron (ZOFRAN) IV, prochlorperazine, sodium chloride flush, sodium chloride flush, sodium chloride flush  REVIEW OF SYSTEMS:    10 Point review of Systems was done is negative except as noted above.   LABORATORY DATA:  I have reviewed the data as listed . CBC Latest Ref Rng & Units 01/22/2017 01/22/2017 01/21/2017  WBC 4.0 - 10.5 K/uL 4.4 1.4(LL) 0.9(LL)  Hemoglobin 12.0 - 15.0 g/dL 9.1(L) 8.2(L) 8.4(L)  Hematocrit 36.0 - 46.0 % 25.6(L) 23.0(L) 23.7(L)  Platelets 150 - 400 K/uL 26(LL) 24(LL) 23(LL)   .ANC 3800  CMP Latest Ref Rng & Units 01/22/2017 01/21/2017 01/20/2017  Glucose 65 - 99 mg/dL 142(H) 135(H) 195(H)  BUN 6 - 20 mg/dL _0 Creatinine 0.44 - 1.00 mg/dL 0.55 0.39(L) 0.36(L)  Sodium 135 - 145 mmol/L 133(L) 131(L) 130(L)  Potassium 3.5 - 5.1 mmol/L 3.3(L) 3.7 4.0  Chloride 101 - 111 mmol/L 102 101 98(L)  CO2 22 - 32 mmol/L _1 Calcium 8.9 - 10.3 mg/dL 7.6(L) 7.9(L) 8.1(L)  Total Protein 6.5 - 8.1 g/dL - 5.0(L) -  Total Bilirubin 0.3 - 1.2 mg/dL - 0.8 -  Alkaline Phos 38 - 126 U/L - 53 -  AST 15 - 41 U/L - 9(L) -  ALT 14 - 54 U/L - 11(L) -     RADIOGRAPHIC STUDIES: I have personally reviewed the radiological images as listed and agreed with the findings in the report. Dg Chest 2 View  Result Date: 01/18/2017 CLINICAL DATA:  Lymphoma, chest pain EXAM: CHEST  2 VIEW COMPARISON:  10/15/2016 FINDINGS: Heart size and vascularity normal. Negative for pneumonia. Negative for mass or effusion. Interval improvement in mediastinal and hilar adenopathy compared with the prior study. Port-A-Cath tip in the mid right atrium. IMPRESSION: No active cardiopulmonary disease. Electronically Signed   By: Franchot Gallo M.D.   On: 01/18/2017 16:09   Nm Pet Image Restag  (ps) Skull Base To Thigh  Result Date: 01/08/2017 CLINICAL DATA:  Subsequent treatment strategy for T-cell lymphoma. EXAM: NUCLEAR MEDICINE PET SKULL BASE TO THIGH TECHNIQUE: 8.3 mCi F-18 FDG was injected intravenously. Full-ring PET imaging was performed from the skull base to thigh after the radiotracer. CT data was obtained and used for attenuation correction and anatomic localization. FASTING BLOOD GLUCOSE:  Value: 139 mg/dl COMPARISON:  Multiple exams, including 10/27/2016 FINDINGS: NECK The previous extensive hypermetabolic adenopathy in the  neck has completely resolved (Deauville 0). Calcification along the left posterior oropharynx, not a usual location for a sialolith, faintly accentuated metabolic activity surrounding this calcification with maximum SUV 7.4 (formerly 4.0), probably inflammatory. This is in the vicinity of the left stylopharyngeus and palatopharyngeus musculature. CHEST The previous extensive hypermetabolic thoracic adenopathy has nearly completely resolved. Adjacent to the descending thoracic aorta near the hiatus there is some indistinct nodal tissue measuring up to 8 mm in short axis on image 97/4, maximum SUV 1.7. Previously at this site there was a 2.8 cm lymph node with maximum SUV 15.4 .Background mediastinal blood pool activity is 2.9. Accordingly this is Deauville 1, although the vast majority of the previous adenopathy is Deauville 0. Trace bilateral pleural effusions. The previous right perihilar opacity has resolved. ABDOMEN/PELVIS The extensive previous retroperitoneal and mesenteric adenopathy has resolved no hypermetabolic or pathologic adenopathy in the abdomen or pelvis is present. Suspected mild hemochromatosis with noncontrast hepatic density 75 Hounsfield units. Dextroconvex lumbar scoliosis. Uterus absent. Mild sigmoid colon diverticulosis. SKELETON No focal hypermetabolic activity to suggest skeletal metastasis. IMPRESSION: 1. Nearly completely resolved adenopathy in  the neck, chest, abdomen, and pelvis, indicating excellent response to therapy. There is no residual hypermetabolic adenopathy, and in a previously large periaortic lymph node in the lower thorax, there is only some indistinct nodal tissue with activity below the mediastinal blood pool (Deauville 1). The vast majority of the previous adenopathy and right perihilar involvement is now Deauville 0. 2. Trace bilateral pleural effusions. 3. There is accentuated activity along a calcification adjacent to the left oropharynx, in the vicinity of the stylopharyngeus and palatopharyngeus musculature. This is a bit posterior to be a sialolith, and may represent a dystrophic calcification. There is some localized hypermetabolic activity around this calcification, probably due to inflammation. Electronically Signed   By: Van Clines M.D.   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   PET/CT scan shows bulky hypermetabolic lymphadenopathy throughout the neck, chest, and abdomen. 1 cm hypermetabolic pelvic lymph node in right external iliac chain. Hypermetabolic masslike opacity in central right lung with multiple adjacent hypermetabolic nodules. This is suspicious for pulmonary involvement by lymphoma  Fatigue anorexia or night sweats-drenching, and weight loss suggest constitutional type B symptoms.  Hepatitis profile and HIV neg  Patient is s/p 4  cyclesof CHOEP tolerated well thus far. PET/CT after 3 cycles of CHOEP showed excellent response to treatment.  Grade 1 fatigue Grade 2 nausea/vomiting and some dehydration after the first cycle of treatment needing IV anti-emetics and IVF. Much improved after adjustment of her supportive medications .  Admitted with #2 neutropenic fever - likely due to mucositis/etiology of odynophagia.  No other overt primary source of infection. Significant neutropenia despite using Neulasta. Patient is day 8 post cycle 4 of chemotherapy. ANC has increased from 100-->600 -->900 and now resolved with [place] 3800 Plan -will discontinue Granix -Continue cefepime for empiric gram-negative coverage pending culture results. -on fluconazole for empiric antifungal coverage -continue Valtrex for empiric rx of HSV in a significantly immunocompromised patient.   #3 significant thrombocytopenia - in the setting of neutropenic fever and significant mucositis with increase risk of GI bleeding goal is to keep the platelets more than 20k.  -Transfuse platelets when necessary to maintain platelets more than 20 k. Platelets starting to trend upwards.  #4  bleeding . -Transfuse when necessary to maintain hemoglobin more than 8 . Hb today up to 9.1 and seems to be uptrending.  #5 esophageal discomfort with swallowing likely due to chemotherapy related mucositis versus esophageal candidiasis versus HSV infection versus reflux esophagitis. Plan -discontinued granix with resolution of neutropenia . Patient still has neulasta in her system. -Sucralfate liquid 4 times a day. -GI cocktail when necessary -No EGD at this time given risk of bacteremia. --Transfuse when necessary for hemoglobin less than 8 and platelets less than 20k -Would likely need to drop Etoposide and only do CHOP with next cycle of treatment. -f/u pending cultures.   #6 history of PVCs -On chronic beta blocker therapy.  #7 history of sleep apnea has been noncompliant with her CPAP use. She does have a CPAP machine after counseling has been using this regularly and has been doing a better job with this .  #5 severe anxiety - this appears somewhat improved -Has been using Xanax when necessary.  -continue on Cymbalta 30 mg by mouth daily.  DISPO - if resolved neutropenia ANC>1000 x 2 days, neg cultures,  not requiring active PRBC/PLT transfusions and esophageal pain controlled might be able to discharge in the next 2-3 days.  Will need outpatient clinic f/u as per her scheduled appointment on 02/01/2017 with Dr Kale with labs and appointment for next chemotherapy.   I spent 25 minutes counseling the patient face to face. The total time spent in the appointment was 35 minutes and more than 50% was on counseling and direct patient cares.    Gautam Kale MD MS AAHIVMS [facility] Hematology/Oncology Physician [facility]  (Office):       [phone] (Work cell):  [phone] (Fax):           [phone]  

## 2017-01-23 NOTE — Progress Notes (Signed)
[facility]  PROGRESS NOTE  Denise [name] TMA:[id number] DOB: [birth year] DOA: 01/18/2017 PCP: Denita Lung, MD   Brief HPI:    61 year old female with past mental history of T-cell lymphoma receiving ongoing chemotherapy with last treatment on 6/7 who presented to the emergency room on 6/11 night with complaints of chest pain and dysphagia.  Patient found to have neutropenic fever and chest pain felt to be secondary to erosive esophagitis versus mucositis from chemotherapy and unable to get endoscopy at this time due to neutropenia. Patient started on broad-spectrum antibiotics. Other infectious workup unrevealing to this date.   Subjective   Patient seen and examined, feeling better this morning. Still has some difficulty swallowing but pain has improved. WBC is 6.2 today.   Assessment/Plan:     1. Pancytopenia with neutropenic fever- chemotherapy-induced, WBC improved to 6.2  today, platelets 33. Has been afebrile. Continue cefepime, fluconazole, Valtrex . Granix discontinued by oncology. Oncology recommends continuing IV antibiotics pending culture results. Blood cultures remain negative to date.  2. Peripheral T-cell lymphoma- recently finished chemotherapy. Oncology is following 3. Esophagitis- suspect Candida esophagitis versus erosive esophagitis. Patient currently on GI cocktail when necessary, nystatin swish and swallow 4 times a day. 4. Hypokalemia- replete.     DVT prophylaxis: SCDs  Code Status: Full code  Family Communication: No family present at bedside  Disposition Plan: Pending improvement in WBC, resolution of fever   Consultants:  Oncology  Procedures:  Platelet transfusion on 01/19/2017  Continuous infusions . sodium chloride 50 mL/hr at 01/22/17 1724  . ceFEPime (MAXIPIME) IV Stopped (01/23/17 0449)      Antibiotics:   Anti-infectives    Start     Dose/Rate Route Frequency Ordered Stop   01/20/17 1745  valACYclovir (VALTREX)  tablet 1,000 mg     1,000 mg Oral 2 times daily 01/20/17 1733 01/27/17 2159   01/20/17 1000  fluconazole (DIFLUCAN) tablet 200 mg    Comments:  Take 2 tabs on day 1 then 1 tab po daily x 12 days     200 mg Oral Daily 01/19/17 1002     01/19/17 1000  fluconazole (DIFLUCAN) tablet 100 mg  Status:  Discontinued    Comments:  Take 2 tabs on day 1 then 1 tab po daily x 12 days     100 mg Oral Daily 01/18/17 2207 01/19/17 1002   01/18/17 1900  ceFEPIme (MAXIPIME) 2 g in dextrose 5 % 50 mL IVPB     2 g 100 mL/hr over 30 Minutes Intravenous Every 8 hours 01/18/17 1857         Objective   Vitals:   01/22/17 1518 01/22/17 2206 01/23/17 0552 01/23/17 0811  BP: 131/60 117/62 (!) 117/45 (!) 153/81  Pulse: 73 73 66 65  Resp: 19 18 16 18   Temp: 99.6 F (37.6 C) 98.3 F (36.8 C) 98.4 F (36.9 C) 98.2 F (36.8 C)  TempSrc: Oral Oral Oral Oral  SpO2: 99% 96% 98% 100%  Weight:      Height:        Intake/Output Summary (Last 24 hours) at 01/23/17 1124 Last data filed at 01/23/17 0700  Gross per 24 hour  Intake          1943.33 ml  Output                0 ml  Net          1943.33 ml   Filed Weights   01/18/17 1505 01/18/17  2206  Weight: 78.9 kg (174 lb) 78.3 kg (172 lb 11.2 oz)     Physical Examination:  Physical Exam: Eyes: No icterus, extraocular muscles intact  Mouth: Oral mucosa is moist, no lesions on palate,  Neck: Supple, no deformities, masses, or tenderness Lungs: Normal respiratory effort, bilateral clear to auscultation, no crackles or wheezes.  Heart: Regular rate and rhythm, S1 and S2 normal, no murmurs, rubs auscultated Abdomen: BS normoactive,soft,nondistended,non-tender to palpation,no organomegaly Extremities: No pretibial edema, no erythema, no cyanosis, no clubbing Neuro : Alert and oriented to time, place and person, No focal deficits Skin: No rashes seen on exam   Data Reviewed: I have personally reviewed following labs and imaging studies  CBG: No  results for input(s): GLUCAP in the last 168 hours.  CBC:  Recent Labs Lab 01/21/17 0459 01/21/17 1740 01/22/17 0304 01/22/17 1942 01/23/17 0318  WBC 0.3* 0.9* 1.4* 4.4 6.2  NEUTROABS 0.1* 0.6* 0.9* 3.8 5.2  HGB 8.6* 8.4* 8.2* 9.1* 8.8*  HCT 24.2* 23.7* 23.0* 25.6* 24.7*  MCV 82.6 80.6 82.7 82.6 82.9  PLT 27* 23* 24* 26* 33*    Basic Metabolic Panel:  Recent Labs Lab 01/19/17 0615 01/20/17 0449 01/21/17 0459 01/22/17 0304 01/23/17 0318  NA 132* 130* 131* 133* 134*  K 4.3 4.0 3.7 3.3* 3.5  CL 99* 98* 101 102 103  CO2 25 25 24 25 23   GLUCOSE 204* 195* 135* 142* 180*  BUN 11 9 7 9 6   CREATININE 0.37* 0.36* 0.39* 0.55 0.42*  CALCIUM 8.3* 8.1* 7.9* 7.6* 7.9*  MG  --  1.5*  --   --   --     Recent Results (from the past 240 hour(s))  Blood culture (routine x 2)     Status: None (Preliminary result)   Collection Time: 01/18/17  6:30 PM  Result Value Ref Range Status   Specimen Description RIGHT ANTECUBITAL  Final   Special Requests   Final    BOTTLES DRAWN AEROBIC AND ANAEROBIC Blood Culture adequate volume   Culture   Final    NO GROWTH 4 DAYS Performed at [facility], [address] [place], [place] [address]    Report Status PENDING  Incomplete  Blood culture (routine x 2)     Status: None (Preliminary result)   Collection Time: 01/18/17  6:35 PM  Result Value Ref Range Status   Specimen Description LEFT ANTECUBITAL  Final   Special Requests   Final    BOTTLES DRAWN AEROBIC AND ANAEROBIC Blood Culture adequate volume   Culture   Final    NO GROWTH 4 DAYS Performed at [facility], [place] [address], [place], [place] [address]    Report Status PENDING  Incomplete     Liver Function Tests:  Recent Labs Lab 01/21/17 0459  AST 9*  ALT 11*  ALKPHOS 53  BILITOT 0.8  PROT 5.0*  ALBUMIN 2.5*   No results for input(s): LIPASE, AMYLASE in the last 168 hours. No results for input(s): AMMONIA in the last 168 hours.  Cardiac Enzymes:  Recent  Labs Lab 01/18/17 2258 01/19/17 0615 01/19/17 1007  TROPONINI 0.03* <0.03 0.03*   BNP (last 3 results)  Recent Labs  10/15/16 0946  BNP 89.0    ProBNP (last 3 results) No results for input(s): PROBNP in the last 8760 hours.    Studies: No results found.  Scheduled Meds: . atenolol  50 mg Oral BID  . dexamethasone  4 mg Oral QAC  lunch  . DULoxetine  30 mg Oral Daily  . fluconazole  200 mg Oral Daily  . mometasone-formoterol  2 puff Inhalation BID  . nystatin  5 mL Oral QID  . pantoprazole  40 mg Oral BID AC  . sodium chloride flush  3 mL Intravenous Q12H  . sucralfate  1 g Oral TID WC & HS  . valACYclovir  1,000 mg Oral BID      Time spent: 25 min  [facility] Pager [phone]. If 7PM-7AM, please contact night-coverage at [email or web address], Office  [phone]  password [email or web address] 01/23/2017, 11:24 AM  LOS: 5 days

## 2017-01-23 NOTE — Progress Notes (Deleted)
.   HEMATOLOGY/ONCOLOGY INPATIENT PROGRESS NOTE  Date of Service: 01/22/2017  Inpatient Attending: .Lama, Gagan S, MD   SUBJECTIVE  [name] was seen later this afternoon. She notes that she continues to feel better . WBC counts improving. Esophageal pain improving. No further fevers. Granix held with resolving neutropenia. No fevers today.  OBJECTIVE:  Mild distress due to odynophagia.  PHYSICAL EXAMINATION: ..BP 117/62 (BP Location: Right Arm)   Pulse 73   Temp 98.3 F (36.8 C) (Oral)   Resp 18   Ht 5' 4" (1.626 m)   Wt 172 lb 11.2 oz (78.3 kg)   LMP 04/25/1998 (Exact Date)   SpO2 96%   BMI 29.64 kg/m  .Body mass index is 29.64 kg/m.  GENERAL:alert, in no acute distress and comfortable SKIN: skin color, texture, turgor are normal, no rashes or significant lesions EYES: normal, conjunctiva are pink and non-injected, sclera clear OROPHARYNX:no exudate, no erythema and lips, buccal mucosa, and tongue normal  NECK: supple, no JVD, thyroid normal size, non-tender, without nodularity LYMPH:  no palpable lymphadenopathy in the cervical, axillary or inguinal LUNGS: clear to auscultation with normal respiratory effort HEART: regular rate & rhythm,  no murmurs and no lower extremity edema ABDOMEN: abdomen soft, non-tender, normoactive bowel sounds  Musculoskeletal: no cyanosis of digits and no clubbing  PSYCH: alert & oriented x 3 with fluent speech NEURO: no focal motor/sensory deficits  MEDICAL HISTORY:  Past Medical History:  Diagnosis Date  . Allergy   . Asthma   . Bursitis of left hip   . Fibroids    TAH in 1999  . GERD (gastroesophageal reflux disease)   . Lymphoma (HCC)    T cell lymphoma  . PVC's (premature ventricular contractions)   . Sleep apnea    does not use CPAP    SURGICAL HISTORY: Past Surgical History:  Procedure Laterality Date  . CARDIAC CATH  05/2007   J. [facility]  . HAMMER TOE SURGERY Bilateral 2016  . IR GENERIC HISTORICAL  11/05/2016   IR US GUIDE VASC ACCESS RIGHT 11/05/2016 Arthur Hoss, MD [facility]  . IR GENERIC HISTORICAL  11/05/2016   IR FLUORO GUIDE PORT INSERTION RIGHT 11/05/2016 Arthur Hoss, MD [facility]  . TOTAL ABDOMINAL HYSTERECTOMY  05/15/1998   secondary to fibroids  . WISDOM TOOTH EXTRACTION  mid teens    SOCIAL HISTORY: Social History   Social History  . Marital status: Significant Other    Spouse name: N/A  . Number of children: 0  . Years of education: N/A   Occupational History  . School social worker at [employer]   Social History Main Topics  . Smoking status: Never Smoker  . Smokeless tobacco: Never Used  . Alcohol use 0.6 - 1.2 oz/week    1 - 2 Standard drinks or equivalent per week     Comment: only if out to dinner, maybe once a month  . Drug use: No  . Sexual activity: Yes    Partners: Female    Birth control/ protection: None   Other Topics Concern  . Not on file   Social History Narrative  . No narrative on file    FAMILY HISTORY: Family History  Problem Relation Age of Onset  . Cancer Maternal Grandmother        GI cancer in 70's  . Dementia Mother   . Heart disease Father   . Heart failure Father     ALLERGIES:  is allergic to   morphine and related.  MEDICATIONS:  Scheduled Meds: . atenolol  50 mg Oral BID  . dexamethasone  4 mg Oral QAC lunch  . docusate sodium  100 mg Oral BID  . DULoxetine  30 mg Oral Daily  . fluconazole  200 mg Oral Daily  . mometasone-formoterol  2 puff Inhalation BID  . nystatin  5 mL Oral QID  . pantoprazole  40 mg Oral BID AC  . sodium chloride flush  3 mL Intravenous Q12H  . sucralfate  1 g Oral TID WC & HS  . Tbo-Filgrastim  480 mcg Subcutaneous QPC supper  . valACYclovir  1,000 mg Oral BID   Continuous Infusions: . sodium chloride 50 mL/hr at 01/20/17 1415  . sodium chloride    . ceFEPime (MAXIPIME) IV Stopped (01/21/17 2147)   PRN Meds:.acetaminophen **OR** acetaminophen, ALPRAZolam, alum  & mag hydroxide-simeth, gi cocktail, heparin lock flush, heparin lock flush, ondansetron **OR** ondansetron (ZOFRAN) IV, prochlorperazine, sodium chloride flush, sodium chloride flush, sodium chloride flush  REVIEW OF SYSTEMS:    10 Point review of Systems was done is negative except as noted above.   LABORATORY DATA:  I have reviewed the data as listed . CBC Latest Ref Rng & Units 01/22/2017 01/22/2017 01/21/2017  WBC 4.0 - 10.5 K/uL 4.4 1.4(LL) 0.9(LL)  Hemoglobin 12.0 - 15.0 g/dL 9.1(L) 8.2(L) 8.4(L)  Hematocrit 36.0 - 46.0 % 25.6(L) 23.0(L) 23.7(L)  Platelets 150 - 400 K/uL 26(LL) 24(LL) 23(LL)   .ANC 3800  CMP Latest Ref Rng & Units 01/22/2017 01/21/2017 01/20/2017  Glucose 65 - 99 mg/dL 142(H) 135(H) 195(H)  BUN 6 - 20 mg/dL _0 Creatinine 0.44 - 1.00 mg/dL 0.55 0.39(L) 0.36(L)  Sodium 135 - 145 mmol/L 133(L) 131(L) 130(L)  Potassium 3.5 - 5.1 mmol/L 3.3(L) 3.7 4.0  Chloride 101 - 111 mmol/L 102 101 98(L)  CO2 22 - 32 mmol/L _1 Calcium 8.9 - 10.3 mg/dL 7.6(L) 7.9(L) 8.1(L)  Total Protein 6.5 - 8.1 g/dL - 5.0(L) -  Total Bilirubin 0.3 - 1.2 mg/dL - 0.8 -  Alkaline Phos 38 - 126 U/L - 53 -  AST 15 - 41 U/L - 9(L) -  ALT 14 - 54 U/L - 11(L) -     RADIOGRAPHIC STUDIES: I have personally reviewed the radiological images as listed and agreed with the findings in the report. Dg Chest 2 View  Result Date: 01/18/2017 CLINICAL DATA:  Lymphoma, chest pain EXAM: CHEST  2 VIEW COMPARISON:  10/15/2016 FINDINGS: Heart size and vascularity normal. Negative for pneumonia. Negative for mass or effusion. Interval improvement in mediastinal and hilar adenopathy compared with the prior study. Port-A-Cath tip in the mid right atrium. IMPRESSION: No active cardiopulmonary disease. Electronically Signed   By: Franchot Gallo M.D.   On: 01/18/2017 16:09   Nm Pet Image Restag (ps) Skull Base To Thigh  Result Date: 01/08/2017 CLINICAL DATA:  Subsequent treatment strategy for T-cell lymphoma.  EXAM: NUCLEAR MEDICINE PET SKULL BASE TO THIGH TECHNIQUE: 8.3 mCi F-18 FDG was injected intravenously. Full-ring PET imaging was performed from the skull base to thigh after the radiotracer. CT data was obtained and used for attenuation correction and anatomic localization. FASTING BLOOD GLUCOSE:  Value: 139 mg/dl COMPARISON:  Multiple exams, including 10/27/2016 FINDINGS: NECK The previous extensive hypermetabolic adenopathy in the neck has completely resolved (Deauville 0). Calcification along the left posterior oropharynx, not a usual location for a sialolith, faintly accentuated metabolic activity surrounding this calcification with maximum SUV  7.4 (formerly 4.0), probably inflammatory. This is in the vicinity of the left stylopharyngeus and palatopharyngeus musculature. CHEST The previous extensive hypermetabolic thoracic adenopathy has nearly completely resolved. Adjacent to the descending thoracic aorta near the hiatus there is some indistinct nodal tissue measuring up to 8 mm in short axis on image 97/4, maximum SUV 1.7. Previously at this site there was a 2.8 cm lymph node with maximum SUV 15.4 .Background mediastinal blood pool activity is 2.9. Accordingly this is Deauville 1, although the vast majority of the previous adenopathy is Deauville 0. Trace bilateral pleural effusions. The previous right perihilar opacity has resolved. ABDOMEN/PELVIS The extensive previous retroperitoneal and mesenteric adenopathy has resolved no hypermetabolic or pathologic adenopathy in the abdomen or pelvis is present. Suspected mild hemochromatosis with noncontrast hepatic density 75 Hounsfield units. Dextroconvex lumbar scoliosis. Uterus absent. Mild sigmoid colon diverticulosis. SKELETON No focal hypermetabolic activity to suggest skeletal metastasis. IMPRESSION: 1. Nearly completely resolved adenopathy in the neck, chest, abdomen, and pelvis, indicating excellent response to therapy. There is no residual hypermetabolic  adenopathy, and in a previously large periaortic lymph node in the lower thorax, there is only some indistinct nodal tissue with activity below the mediastinal blood pool (Deauville 1). The vast majority of the previous adenopathy and right perihilar involvement is now Deauville 0. 2. Trace bilateral pleural effusions. 3. There is accentuated activity along a calcification adjacent to the left oropharynx, in the vicinity of the stylopharyngeus and palatopharyngeus musculature. This is a bit posterior to be a sialolith, and may represent a dystrophic calcification. There is some localized hypermetabolic activity around this calcification, probably due to inflammation. Electronically Signed   By: Walter  Liebkemann M.D.   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   PET/CT scan shows bulky hypermetabolic lymphadenopathy throughout the neck, chest, and abdomen. 1 cm hypermetabolic pelvic lymph node in right external iliac chain. Hypermetabolic masslike opacity in central right lung with multiple adjacent hypermetabolic nodules. This is suspicious for pulmonary involvement by lymphoma  Fatigue anorexia or night sweats-drenching, and weight loss suggest constitutional type B symptoms.  Hepatitis profile and HIV neg  Patient is s/p 4  cycles of CHOEP tolerated well thus far. PET/CT after 3 cycles of CHOEP showed excellent response to treatment.  Grade 1 fatigue Grade 2 nausea/vomiting and some dehydration after the first cycle of treatment  needing IV anti-emetics and IVF. Much improved after adjustment of her supportive medications .  Admitted with #2 neutropenic fever - likely due to mucositis/etiology of odynophagia. No other overt primary source of infection. Significant neutropenia despite using Neulasta. Patient is day 8 post cycle 4  of chemotherapy. ANC has increased from 100-->600 -->900 and now resolved with ANC 3800 Plan -will discontinue Granix -Continue cefepime for empiric gram-negative coverage pending culture results. -on fluconazole for empiric antifungal coverage -continue Valtrex for empiric rx of HSV in a significantly immunocompromised patient.   #3 significant thrombocytopenia - in the setting of neutropenic fever and significant mucositis with increase risk of GI bleeding goal is to keep the platelets more than 20k.  -Transfuse platelets when necessary to maintain platelets more than 20 k. Platelets starting to trend upwards.  #4  symptomatic anemia . No overt evidence of bleeding . No hematemesis no melena or hematochezia . Given mucositis and thrombocytopenia patient is at high risk of   bleeding . -Transfuse when necessary to maintain hemoglobin more than 8 . Hb today up to 9.1 and seems to be uptrending.  #5 esophageal discomfort with swallowing likely due to chemotherapy related mucositis versus esophageal candidiasis versus HSV infection versus reflux esophagitis. Plan -discontinued granix with resolution of neutropenia . Patient still has neulasta in her system. -Sucralfate liquid 4 times a day. -GI cocktail when necessary -No EGD at this time given risk of bacteremia. --Transfuse when necessary for hemoglobin less than 8 and platelets less than 20k -Would likely need to drop Etoposide and only do CHOP with next cycle of treatment. -f/u pending cultures.   #6 history of PVCs -On chronic beta blocker therapy.  #7 history of sleep apnea has been noncompliant with her CPAP use. She does have a CPAP machine after counseling has been using this regularly and has been doing a better job with this .  #5 severe anxiety - this appears somewhat improved -Has been using Xanax when necessary.  -continue on Cymbalta 30 mg by mouth daily.  DISPO - if resolved neutropenia ANC>1000 x 2 days, neg cultures,  not requiring active PRBC/PLT transfusions and esophageal pain controlled might be able to discharge in the next 2-3 days.  Will need outpatient clinic f/u as per her scheduled appointment on 02/01/2017 with Dr Kale with labs and appointment for next chemotherapy.   I spent 25 minutes counseling the patient face to face. The total time spent in the appointment was 35 minutes and more than 50% was on counseling and direct patient cares.    Gautam Kale MD MS AAHIVMS [facility] Hematology/Oncology Physician [facility]  (Office):       [phone] (Work cell):  [phone] (Fax):           [phone]  

## 2017-01-24 ENCOUNTER — Other Ambulatory Visit: Payer: Self-pay | Admitting: Hematology

## 2017-01-24 LAB — BASIC METABOLIC PANEL
Anion gap: 5 (ref 5–15)
BUN: 8 mg/dL (ref 6–20)
CALCIUM: 8 mg/dL — AB (ref 8.9–10.3)
CO2: 27 mmol/L (ref 22–32)
CREATININE: 0.46 mg/dL (ref 0.44–1.00)
Chloride: 103 mmol/L (ref 101–111)
GFR calc Af Amer: >60 mL/min (ref >60)
GFR calc non Af Amer: >60 mL/min (ref >60)
GLUCOSE: 137 mg/dL — AB (ref 65–99)
Potassium: 3.4 mmol/L — ABNORMAL LOW (ref 3.5–5.1)
Sodium: 135 mmol/L (ref 135–145)

## 2017-01-24 LAB — CBC WITH DIFFERENTIAL/PLATELET
BASOS PCT: 0 %
Basophils Absolute: 0 10*3/uL (ref 0.0–0.1)
EOS ABS: 0 10*3/uL (ref 0.0–0.7)
EOS PCT: 0 %
HEMATOCRIT: 24.1 % — AB (ref 36.0–46.0)
Hemoglobin: 8.4 g/dL — ABNORMAL LOW (ref 12.0–15.0)
LYMPHS ABS: 0.9 10*3/uL (ref 0.7–4.0)
Lymphocytes Relative: 10 %
MCH: 29.1 pg (ref 26.0–34.0)
MCHC: 34.9 g/dL (ref 30.0–36.0)
MCV: 83.4 fL (ref 78.0–100.0)
MONO ABS: 0.4 10*3/uL (ref 0.1–1.0)
Monocytes Relative: 5 %
Neutro Abs: 7.3 10*3/uL (ref 1.7–7.7)
Neutrophils Relative %: 85 %
Platelets: 42 10*3/uL — ABNORMAL LOW (ref 150–400)
RBC: 2.89 MIL/uL — ABNORMAL LOW (ref 3.87–5.11)
RDW: 18.2 % — AB (ref 11.5–15.5)
WBC: 8.6 10*3/uL (ref 4.0–10.5)

## 2017-01-24 MED ORDER — VALACYCLOVIR HCL 1 G PO TABS: 1000.0000 mg | ORAL_TABLET | Freq: Two times a day (BID) | ORAL | 0 refills | Status: DC

## 2017-01-24 MED ORDER — FLUCONAZOLE 100 MG PO TABS: 100.0000 mg | ORAL_TABLET | Freq: Every day | ORAL | 0 refills | Status: DC

## 2017-01-24 MED ORDER — POTASSIUM CHLORIDE CRYS ER 20 MEQ PO TBCR
20.0000 meq | EXTENDED_RELEASE_TABLET | Freq: Once | ORAL | Status: AC
Start: 2017-01-24 — End: 2017-01-24
  Administered 2017-01-24: 20 meq via ORAL
  Filled 2017-01-24: qty 1

## 2017-01-24 MED ORDER — ALUM & MAG HYDROXIDE-SIMETH 200-200-20 MG/5ML PO SUSP: 30.0000 mL | ORAL | 0 refills | Status: DC | PRN

## 2017-01-24 MED ORDER — HEPARIN SOD (PORK) LOCK FLUSH 100 UNIT/ML IV SOLN
500.0000 [IU] | INTRAVENOUS | Status: DC
  Filled 2017-01-24: qty 5

## 2017-01-24 MED ORDER — SUCRALFATE 1 GM/10ML PO SUSP: 1.0000 g | Freq: Three times a day (TID) | ORAL | 0 refills | Status: DC

## 2017-01-24 MED ORDER — HEPARIN SOD (PORK) LOCK FLUSH 100 UNIT/ML IV SOLN
500.0000 [IU] | INTRAVENOUS | Status: DC | PRN
  Administered 2017-01-24: 500 [IU]
  Filled 2017-01-24: qty 5

## 2017-01-24 MED ORDER — NYSTATIN 100000 UNIT/ML MT SUSP: 5.0000 mL | Freq: Four times a day (QID) | OROMUCOSAL | 2 refills | Status: DC | PRN

## 2017-01-24 NOTE — Progress Notes (Signed)
Pharmacy Antibiotic Note  Denise [name] is a 61 y.o. female with T-cell Lymphoma receiving chemo (last given 01/13/17), admitted on 01/18/2017 with Febrile Neutropenia.  Pharmacy has been consulted for Cefepime dosing.  Day #7 cefepime,  WBC WNL now. AF. Blood cultures no growth final  Plan: Cefepime 2g IV q8 hr ? DC abx or transition to PO?  Height: 5\' 4"  (162.6 cm) Weight: 172 lb 11.2 oz (78.3 kg) IBW/kg (Calculated) : 54.7  Temp (24hrs), Avg:98.2 F (36.8 C), Min:98 F (36.7 C), Max:98.4 F (36.9 C)   Recent Labs Lab 01/18/17 2258 01/19/17 0205  01/20/17 0449  01/21/17 0459 01/21/17 1740 01/22/17 0304 01/22/17 1942 01/23/17 0318 01/24/17 0544  WBC  --   --   < > 0.1*  < > 0.3* 0.9* 1.4* 4.4 6.2 8.6  CREATININE  --   --   < > 0.36*  --  0.39*  --  0.55  --  0.42* 0.46  LATICACIDVEN 2.2* 2.2*  --   --   --   --   --   --   --   --   --   < > = values in this interval not displayed.  Estimated Creatinine Clearance: 74.7 mL/min (by C-G formula based on SCr of 0.46 mg/dL).    Allergies  Allergen Reactions  . Morphine And Related    6/11 BCx: NGF  6/11 Cefepime >> 6/12 Fluconazole >> 6/13 Valtrex/Onc >>  Thank you for allowing pharmacy to be a part of this patient's care. Eudelia Bunch, Pharm.D. [phone] 01/24/2017 9:37 AM

## 2017-01-24 NOTE — Discharge Summary (Signed)
Physician Discharge Summary  Denise [name] XTG:[id number] DOB: Jan 23, 1956 DOA: 01/18/2017  PCP: Denita Lung, MD  Admit date: 01/18/2017 Discharge date: 01/24/2017  Time spent: 35  minutes  Recommendations for Outpatient Follow-up:  1. Follow up PCP in one week 2. 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 Patient found to have neutropenic fever and chest pain felt to be secondary to erosive esophagitis versus mucositis from chemotherapy and unable to get endoscopy at this time due to neutropenia. Patient started on broad-spectrum antibiotics. Other infectious workup unrevealing to this date.      Hospital Course:  1. Pancytopenia with neutropenic fever- chemotherapy-induced, patient presented with sever neutropenia with WBC 0.1 was started on Granix and WBC steadily improved. WBC is 8.6, was 6.2 yesterday. Platelets also improved to 42,000. Blood cultures for 5 days showed no growth. Patient received IV cefepime for 7 days. Patient also got fluconazole, Valtrex .  Will discharge patient home on fluconazole for 7 more days along with Valtrex for 2 more days. Patient has been afebrile for past 4 days. 2. Peripheral T-cell lymphoma- recently finished  chemotherapy. Oncology is following as outpatient. Continue home medications. 3. Esophagitis- suspect Candida esophagitis versus erosive esophagitis. Patient was started on  GI cocktail when necessary, nystatin swish and swallow 4 times a day. will discharge patient home on nystatin, fluconazole, Maalox when necessary. 4. Hypokalemia- potassium is 3.4, will give Kdur 20 meq po x1  Procedures:  None   Consultations:  Oncology  Discharge Exam: Vitals:   01/23/17 2017 01/24/17 0453  BP: 138/70 (!) 147/71  Pulse: 68 (!) 59  Resp: 18 18  Temp: 98 F (36.7 C) 98.4 F (36.9 C)    General: Appears in no acute distress Cardiovascular:  S1-S2, regular Respiratory:  Clear to auscultation bilaterally  Discharge Instructions   Discharge Instructions    Care order/instruction    Complete by:  As directed    Transfuse Parameters   Complete patient signature process for consent form    Complete by:  As directed    Diet - low sodium heart healthy    Complete by:  As directed    Increase activity slowly    Complete by:  As directed    Practitioner attestation of consent    Complete by:  As directed    I, the ordering practitioner, attest that I have discussed with the patient the benefits, risks, side effects, alternatives, likelihood of achieving goals and potential problems during recovery for the procedure listed.   Procedure:  Blood Product(s)     Current Discharge Medication List    START taking these medications   Details  alum & mag hydroxide-simeth (MAALOX/MYLANTA) 200-200-20 MG/5ML suspension Take 30 mLs by mouth every 4 (  four) hours as needed for indigestion. Qty: 355 mL, Refills: 0    sucralfate (CARAFATE) 1 GM/10ML suspension Take 10 mLs (1 g total) by mouth 4 (four) times daily -  with meals and at bedtime. Qty: 420 mL, Refills: 0    valACYclovir (VALTREX) 1000 MG tablet Take 1 tablet (1,000 mg total) by mouth 2 (two) times daily. Qty: 4 tablet, Refills: 0    Associated Diagnoses: Oropharyngeal mucositis      CONTINUE these medications which have CHANGED   Details  fluconazole (DIFLUCAN) 100 MG tablet Take 1 tablet (100 mg total) by mouth daily. Take 2 tabs on day 1 then 1 tab po daily x 12 days Qty: 14 tablet, Refills: 0    nystatin (MYCOSTATIN) 100000 UNIT/ML suspension Take 5 mLs (500,000 Units total) by mouth 4 (four) times daily as needed (thrush). Qty: 100 mL, Refills: 2   Associated Diagnoses: Thrush of mouth and esophagus ([place])      CONTINUE these medications which have NOT CHANGED   Details  atenolol (TENORMIN) 50 MG tablet TAKE 1 TABLET BY MOUTH TWICE DAILY. Qty: 180 tablet, Refills: 1    dexamethasone (DECADRON) 4 MG tablet TAKE 1 TABLET(4 MG) BY MOUTH DAILY WITH LUNCH Qty: 30 tablet, Refills: 0    DULoxetine (CYMBALTA) 30 MG capsule Take 1 capsule (30 mg total) by mouth daily. Qty: 30 capsule, Refills: 3    esomeprazole (NEXIUM) 40 MG capsule Take 1 capsule (40 mg total) by mouth daily before breakfast. Qty: 100 capsule, Refills: 0    hydrochlorothiazide (MICROZIDE) 12.5 MG capsule TAKE ONE CAPSULE BY MOUTH EVERY DAY Qty: 30 capsule, Refills: 11    ondansetron (ZOFRAN ODT) 8 MG disintegrating tablet Take 1 tablet (8 mg total) by mouth every 8 (eight) hours as needed for nausea or vomiting. Qty: 20 tablet, Refills: 0    ADVAIR DISKUS 100-50 MCG/DOSE AEPB Inhale 1 puff into the lungs daily as needed (sob and wheezing).     albuterol (PROVENTIL HFA;VENTOLIN HFA) 108 (90 Base) MCG/ACT inhaler Inhale 2 puffs into the lungs every 6 (six) hours as needed for wheezing or shortness of breath. Qty: 1 Inhaler, Refills: 0   Associated Diagnoses: Extrinsic asthma, mild persistent, uncomplicated    ALPRAZolam (XANAX) 0.25 MG tablet TAKE 1 TABLET BY MOUTH TWICE A DAY AS NEEDED FOR ANXIETY Qty: 50 tablet, Refills: 0   Associated Diagnoses: Stress due to family tension    aspirin 81 MG chewable tablet Chew 81 mg by mouth daily as  needed for moderate pain.     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 Qty: 200 mL, Refills: 0    meloxicam (MOBIC) 15 MG tablet TK 1 T PO QD PRN FOR MUSCLE SPASMS Refills: 0    predniSONE (DELTASONE) 20 MG tablet Take 4 tablets (80 mg total) with breakfast. Take days 1-5 with chemotherapy. Qty: 20 tablet, Refills: 2    prochlorperazine (COMPAZINE) 10 MG tablet Take 1 tablet (10 mg total) by mouth every 6 (six) hours as needed (Nausea or vomiting). Qty: 30 tablet, Refills: 6   Associated Diagnoses: Peripheral T cell lymphoma of lymph nodes of multiple sites ([place])      STOP taking these medications     aspirin 325 MG EC tablet  ondansetron (ZOFRAN) 8 MG tablet        Allergies  Allergen Reactions  . Morphine And Related       The results of significant diagnostics from this hospitalization (including imaging, microbiology, ancillary and laboratory) are listed below for reference.    Significant Diagnostic Studies: Dg Chest 2 View  Result Date: 01/18/2017 CLINICAL DATA:  Lymphoma, chest pain EXAM: CHEST  2 VIEW COMPARISON:  10/15/2016 FINDINGS: Heart size and vascularity normal. Negative for pneumonia. Negative for mass or effusion. Interval improvement in mediastinal and hilar adenopathy compared with the prior study. Port-A-Cath tip in the mid right atrium. IMPRESSION: No active cardiopulmonary disease. Electronically Signed   By: Franchot Gallo M.D.   On: 01/18/2017 16:09   Nm Pet Image Restag (ps) Skull Base To Thigh  Result Date: 01/08/2017 CLINICAL DATA:  Subsequent treatment strategy for T-cell lymphoma. EXAM: NUCLEAR MEDICINE PET SKULL BASE TO THIGH TECHNIQUE: 8.3 mCi F-18 FDG was injected intravenously. Full-ring PET imaging was  performed from the skull base to thigh after the radiotracer. CT data was obtained and used for attenuation correction and anatomic localization. FASTING BLOOD GLUCOSE:  Value: 139 mg/dl COMPARISON:  Multiple exams, including 10/27/2016 FINDINGS: NECK The previous extensive hypermetabolic adenopathy in the neck has completely resolved (Deauville 0). Calcification along the left posterior oropharynx, not a usual location for a sialolith, faintly accentuated metabolic activity surrounding this calcification with maximum SUV 7.4 (formerly 4.0), probably inflammatory. This is in the vicinity of the left stylopharyngeus and palatopharyngeus musculature. CHEST The previous extensive hypermetabolic thoracic adenopathy has nearly completely resolved. Adjacent to the descending thoracic aorta near the hiatus there is some indistinct nodal tissue measuring up to 8 mm in short axis on image 97/4, maximum SUV 1.7. Previously at this site there was a 2.8 cm lymph node with maximum SUV 15.4 .Background mediastinal blood pool activity is 2.9. Accordingly this is Deauville 1, although the vast majority of the previous adenopathy is Deauville 0. Trace bilateral pleural effusions. The previous right perihilar opacity has resolved. ABDOMEN/PELVIS The extensive previous retroperitoneal and mesenteric adenopathy has resolved no hypermetabolic or pathologic adenopathy in the abdomen or pelvis is present. Suspected mild hemochromatosis with noncontrast hepatic density 75 Hounsfield units. Dextroconvex lumbar scoliosis. Uterus absent. Mild sigmoid colon diverticulosis. SKELETON No focal hypermetabolic activity to suggest skeletal metastasis. IMPRESSION: 1. Nearly completely resolved adenopathy in the neck, chest, abdomen, and pelvis, indicating excellent response to therapy. There is no residual hypermetabolic adenopathy, and in a previously large periaortic lymph node in the lower thorax, there is only some indistinct nodal tissue with  activity below the mediastinal blood pool (Deauville 1). The vast majority of the previous adenopathy and right perihilar involvement is now Deauville 0. 2. Trace bilateral pleural effusions. 3. There is accentuated activity along a calcification adjacent to the left oropharynx, in the vicinity of the stylopharyngeus and palatopharyngeus musculature. This is a bit posterior to be a sialolith, and may represent a dystrophic calcification. There is some localized hypermetabolic activity around this calcification, probably due to inflammation. Electronically Signed   By: Van Clines M.D.   On: 01/08/2017 16:59    Microbiology: Recent Results (from the past 240 hour(s))  Blood culture (routine x 2)     Status: None   Collection Time: 01/18/17  6:30 PM  Result Value Ref Range Status   Specimen Description RIGHT ANTECUBITAL  Final   Special Requests   Final    BOTTLES DRAWN AEROBIC AND ANAEROBIC Blood Culture adequate  volume   Culture   Final    NO GROWTH 5 DAYS Performed at [facility], [place] [address], [place], [place] [address]    Report Status 01/23/2017 FINAL  Final  Blood culture (routine x 2)     Status: None   Collection Time: 01/18/17  6:35 PM  Result Value Ref Range Status   Specimen Description LEFT ANTECUBITAL  Final   Special Requests   Final    BOTTLES DRAWN AEROBIC AND ANAEROBIC Blood Culture adequate volume   Culture   Final    NO GROWTH 5 DAYS Performed at [facility], [place] [address] [place], [place] [address]    Report Status 01/23/2017 FINAL  Final     Labs: Basic Metabolic Panel:  Recent Labs Lab 01/20/17 0449 01/21/17 0459 01/22/17 0304 01/23/17 0318 01/24/17 0544  NA 130* 131* 133* 134* 135  K 4.0 3.7 3.3* 3.5 3.4*  CL 98* 101 102 103 103  CO2 25 24 25 23 27   GLUCOSE 195* 135* 142* 180* 137*  BUN 9 7 9 6 8   CREATININE 0.36* 0.39* 0.55 0.42* 0.46  CALCIUM 8.1* 7.9* 7.6* 7.9* 8.0*  MG 1.5*  --   --   --   --    Liver Function  Tests:  Recent Labs Lab 01/21/17 0459  AST 9*  ALT 11*  ALKPHOS 53  BILITOT 0.8  PROT 5.0*  ALBUMIN 2.5*   No results for input(s): LIPASE, AMYLASE in the last 168 hours. No results for input(s): AMMONIA in the last 168 hours. CBC:  Recent Labs Lab 01/21/17 1740 01/22/17 0304 01/22/17 1942 01/23/17 0318 01/24/17 0544  WBC 0.9* 1.4* 4.4 6.2 8.6  NEUTROABS 0.6* 0.9* 3.8 5.2 7.3  HGB 8.4* 8.2* 9.1* 8.8* 8.4*  HCT 23.7* 23.0* 25.6* 24.7* 24.1*  MCV 80.6 82.7 82.6 82.9 83.4  PLT 23* 24* 26* 33* 42*   Cardiac Enzymes:  Recent Labs Lab 01/18/17 2258 01/19/17 0615 01/19/17 1007  TROPONINI 0.03* <0.03 0.03*   BNP: BNP (last 3 results)  Recent Labs  10/15/16 0946  BNP 89.0        Signed:  Navpreet Szczygiel S MD.  [facility] 01/24/2017, 10:05 AM

## 2017-01-25 ENCOUNTER — Encounter: Payer: Self-pay | Admitting: *Deleted

## 2017-01-25 ENCOUNTER — Telehealth: Payer: Self-pay | Admitting: Family Medicine

## 2017-01-25 NOTE — Telephone Encounter (Signed)
Called pt for transition of care/hospital follow up and she states she is doing better, she is in good spirits and has all her meds and doesn't need any help with anything at the present.  She scheduled hosp f/up appt for Friday.

## 2017-01-26 NOTE — Progress Notes (Signed)
Marland Kitchen    HEMATOLOGY/ONCOLOGY CLINIC NOTE  Date of Service: .01/11/2017  Patient Care Team: Denita Lung, MD as PCP - General (Family Medicine)  CHIEF COMPLAINTS/PURPOSE OF CONSULTATION:  f/u for PTCL NOS  HISTORY OF PRESENTING ILLNESS:  plz see previous note for HPI  INTERVAL HISTORY  Denise [name] is here with her partner for her scheduled follow-up prior to her 4th cycle of chemotherapy. We reviewed her PET scan images and results in details which showed nearly completely resolved lymphadenopathy with no residual hypermetabolic adenopathy. Patient is understandably glad with these results. No other overt prohibitive toxicities from chemotherapy at this time. We shall proceed with her planned fourth cycle of CHOEP with neulasta support. No fevers no chills no night sweats. She seems to generally be in good spirits.   MEDICAL HISTORY:  Past Medical History:  Diagnosis Date  . Allergy   . Asthma   . Bursitis of left hip   . Fibroids    TAH in 1999  . GERD (gastroesophageal reflux disease)   . Lymphoma ([place])    T cell lymphoma  . PVC's (premature ventricular contractions)   . Sleep apnea    does not use CPAP    SURGICAL HISTORY: Past Surgical History:  Procedure Laterality Date  . CARDIAC CATH  05/2007   J. BERRY  . HAMMER TOE SURGERY Bilateral 2016  . IR GENERIC HISTORICAL  11/05/2016   IR US GUIDE VASC ACCESS RIGHT 11/05/2016 [name], MD [facility]  . IR GENERIC HISTORICAL  11/05/2016   IR FLUORO GUIDE PORT INSERTION RIGHT 11/05/2016 [name], MD [facility]  . TOTAL ABDOMINAL HYSTERECTOMY  05/15/1998   secondary to fibroids  . WISDOM TOOTH EXTRACTION  mid teens    SOCIAL HISTORY: Social History   Social History  . Marital status: Significant Other    Spouse name: N/A  . Number of children: 0  . Years of education: N/A   Occupational History  . School Education officer, museum at [employer] History Main Topics  . Smoking  status: Never Smoker  . Smokeless tobacco: Never Used  . Alcohol use 0.6 - 1.2 oz/week    1 - 2 Standard drinks or equivalent per week     Comment: only if out to dinner, maybe once a month  . Drug use: No  . Sexual activity: Yes    Partners: Female    Birth control/ protection: None   Other Topics Concern  . Not on file   Social History Narrative  . No narrative on file    FAMILY HISTORY: Family History  Problem Relation Age of Onset  . Cancer Maternal Grandmother        GI cancer in 45's  . Dementia Mother   . Heart disease Father   . Heart failure Father     ALLERGIES:  is allergic to morphine and related.  MEDICATIONS:  Current Outpatient Prescriptions  Medication Sig Dispense Refill  . ADVAIR DISKUS 100-50 MCG/DOSE AEPB Inhale 1 puff into the lungs daily as needed (sob and wheezing).     Marland Kitchen albuterol (PROVENTIL HFA;VENTOLIN HFA) 108 (90 Base) MCG/ACT inhaler Inhale 2 puffs into the lungs every 6 (six) hours as needed for wheezing or shortness of breath. 1 Inhaler 0  . ALPRAZolam (XANAX) 0.25 MG tablet TAKE 1 TABLET BY MOUTH TWICE A DAY AS NEEDED FOR ANXIETY 50 tablet 0  . aspirin 81 MG chewable tablet Chew 81 mg by mouth daily  as needed for moderate pain.     Marland Kitchen atenolol (TENORMIN) 50 MG tablet TAKE 1 TABLET BY MOUTH TWICE DAILY. 180 tablet 1  . dexamethasone (DECADRON) 4 MG tablet TAKE 1 TABLET(4 MG) BY MOUTH DAILY WITH LUNCH 30 tablet 0  . DULoxetine (CYMBALTA) 30 MG capsule Take 1 capsule (30 mg total) by mouth daily. 30 capsule 3  . esomeprazole (NEXIUM) 40 MG capsule Take 1 capsule (40 mg total) by mouth daily before breakfast. (Patient taking differently: Take 40 mg by mouth daily as needed (indigestion). ) 100 capsule 0  . hydrochlorothiazide (MICROZIDE) 12.5 MG capsule TAKE ONE CAPSULE BY MOUTH EVERY DAY 30 capsule 11  . lidocaine-prilocaine (EMLA) cream Apply to affected area once 30 g 3  . ondansetron (ZOFRAN ODT) 8 MG disintegrating tablet Take 1 tablet (8 mg  total) by mouth every 8 (eight) hours as needed for nausea or vomiting. 20 tablet 0  . predniSONE (DELTASONE) 20 MG tablet Take 4 tablets (80 mg total) with breakfast. Take days 1-5 with chemotherapy. (Patient taking differently: Take 60 mg by mouth daily with breakfast. Take 3 tablets (60 mg total) with breakfast. Take days 1-5 with chemotherapy.) 20 tablet 2  . prochlorperazine (COMPAZINE) 10 MG tablet Take 1 tablet (10 mg total) by mouth every 6 (six) hours as needed (Nausea or vomiting). 30 tablet 6  . alum & mag hydroxide-simeth (MAALOX/MYLANTA) 200-200-20 MG/5ML suspension Take 30 mLs by mouth every 4 (four) hours as needed for indigestion. 355 mL 0  . fluconazole (DIFLUCAN) 100 MG tablet Take 1 tablet (100 mg total) by mouth daily. Take 2 tabs on day 1 then 1 tab po daily x 12 days 14 tablet 0  . magic mouthwash w/lidocaine SOLN Take 5 mLs by mouth 4 (four) times daily as needed for mouth pain. 200 mL 0  . meloxicam (MOBIC) 15 MG tablet TK 1 T PO QD PRN FOR MUSCLE SPASMS  0  . nystatin (MYCOSTATIN) 100000 UNIT/ML suspension Take 5 mLs (500,000 Units total) by mouth 4 (four) times daily as needed (thrush). 100 mL 2  . sucralfate (CARAFATE) 1 GM/10ML suspension Take 10 mLs (1 g total) by mouth 4 (four) times daily -  with meals and at bedtime. 420 mL 0  . valACYclovir (VALTREX) 1000 MG tablet Take 1 tablet (1,000 mg total) by mouth 2 (two) times daily. 4 tablet 0   No current facility-administered medications for this visit.    Facility-Administered Medications Ordered in Other Visits  Medication Dose Route Frequency Provider Last Rate Last Dose  . sodium chloride flush (NS) 0.9 % injection 10 mL  10 mL Intracatheter PRN Brunetta Genera, MD   10 mL at 12/01/16 1731    REVIEW OF SYSTEMS:    10 Point review of Systems was done is negative except as noted above.  PHYSICAL EXAMINATION: ECOG PERFORMANCE STATUS: 1 - Symptomatic but completely ambulatory  . Vitals:   01/11/17 1100  BP:  133/68  Pulse: 64  Resp: 18  Temp: 98 F (36.7 C)   Filed Weights   01/11/17 1100  Weight: 175 lb 9.6 oz (79.7 kg)   .Body mass index is 28.34 kg/m.  GENERAL:alert, in no acute distress and comfortable SKIN: skin color, texture, turgor are normal, no rashes or significant lesions EYES: normal, conjunctiva are pink and non-injected, sclera clear OROPHARYNX:no exudate, no erythema and lips, buccal mucosa, and tongue normal  NECK: supple, no JVD,No inspiratory stridor, no facial swelling or upper extremity swelling. LYMPH: Extensive  bilateral supraclavicular lymphadenopathy, no palpable inguinal or axillary lymphadenopathy noted. LUNGS: clear to auscultation with normal respiratory effort HEART: regular rate & rhythm,  no murmurs and no lower extremity edema ABDOMEN: abdomen soft, non-tender, normoactive bowel sounds  Musculoskeletal: no cyanosis of digits and no clubbing  PSYCH: alert & oriented x 3 with fluent speech NEURO: no focal motor/sensory deficits  LABORATORY DATA:  I have reviewed the data as listed  Component     Latest Ref Rng & Units 01/11/2017  WBC     4.0 - 10.5 K/uL 10.4 (H)  NEUT#     1.7 - 7.7 K/uL 9.3 (H)  Hemoglobin     12.0 - 15.0 g/dL 9.7 (L)  HCT     36.0 - 46.0 % 29.5 (L)  Platelets     150 - 400 K/uL 267  MCV     78.0 - 100.0 fL 82.9  MCH     26.0 - 34.0 pg 27.2  MCHC     30.0 - 36.0 g/dL 32.9  RBC     3.87 - 5.11 MIL/uL 3.56 (L)  RDW     11.5 - 15.5 % 21.7 (H)  lymph#     0.9 - 3.3 10e3/uL 0.8 (L)  MONO#     0.1 - 0.9 10e3/uL 0.3  Eosinophils Absolute     0.0 - 0.7 K/uL 0.0  Basophils Absolute     0.0 - 0.1 K/uL 0.0  NEUT%     38.4 - 76.8 % 89.2 (H)  LYMPH%     14.0 - 49.7 % 7.5 (L)  MONO%     0.0 - 14.0 % 3.2  EOS%     0.0 - 7.0 % 0.0  BASO%     0.0 - 2.0 % 0.1  Retic %     0.70 - 2.10 % 5.46 (H)  Retic Ct Abs     33.70 - 90.70 10e3/uL 194.38 (H)  Immature Retic Fract     1.60 - 10.00 % 19.40 (H)  Neutrophils     %     Lymphocytes     %   Monocytes Relative     %   Eosinophil     %   Basophil     %   Lymphocyte #     0.7 - 4.0 K/uL   Monocyte #     0.1 - 1.0 K/uL   RBC Morphology        WBC Morphology        Sodium     136 - 145 mEq/L 138  Potassium     3.5 - 5.1 mEq/L 3.8  Chloride     98 - 109 mEq/L 103  CO2     22 - 29 mEq/L 28  Glucose     70 - 140 mg/dl 160 (H)  BUN     7.0 - 26.0 mg/dL 11.7  Creatinine     0.6 - 1.1 mg/dL 0.6  Total Bilirubin     0.20 - 1.20 mg/dL 0.34  Alkaline Phosphatase     40 - 150 U/L 73  AST     5 - 34 U/L 13  ALT     0 - 55 U/L 20  Total Protein     6.4 - 8.3 g/dL 5.9 (L)  Albumin     3.5 - 5.0 g/dL 3.4 (L)  Calcium     8.4 - 10.4 mg/dL 8.8  Anion gap     3 -  11 mEq/L 7  EGFR     >90 ml/min/1.73 m2 >90  LDH     125 - 245 U/L 357 (H)        RADIOGRAPHIC STUDIES: I have personally reviewed the radiological images as listed and agreed with the findings in the report. Dg Chest 2 View  Result Date: 01/18/2017 CLINICAL DATA:  Lymphoma, chest pain EXAM: CHEST  2 VIEW COMPARISON:  10/15/2016 FINDINGS: Heart size and vascularity normal. Negative for pneumonia. Negative for mass or effusion. Interval improvement in mediastinal and hilar adenopathy compared with the prior study. Port-A-Cath tip in the mid right atrium. IMPRESSION: No active cardiopulmonary disease. Electronically Signed   By: Franchot Gallo M.D.   On: 01/18/2017 16:09   Nm Pet Image Restag (ps) Skull Base To Thigh  Result Date: 01/08/2017 CLINICAL DATA:  Subsequent treatment strategy for T-cell lymphoma. EXAM: NUCLEAR MEDICINE PET SKULL BASE TO THIGH TECHNIQUE: 8.3 mCi F-18 FDG was injected intravenously. Full-ring PET imaging was performed from the skull base to thigh after the radiotracer. CT data was obtained and used for attenuation correction and anatomic localization. FASTING BLOOD GLUCOSE:  Value: 139 mg/dl COMPARISON:  Multiple exams, including 10/27/2016 FINDINGS: NECK  The previous extensive hypermetabolic adenopathy in the neck has completely resolved (Deauville 0). Calcification along the left posterior oropharynx, not a usual location for a sialolith, faintly accentuated metabolic activity surrounding this calcification with maximum SUV 7.4 (formerly 4.0), probably inflammatory. This is in the vicinity of the left stylopharyngeus and palatopharyngeus musculature. CHEST The previous extensive hypermetabolic thoracic adenopathy has nearly completely resolved. Adjacent to the descending thoracic aorta near the hiatus there is some indistinct nodal tissue measuring up to 8 mm in short axis on image 97/4, maximum SUV 1.7. Previously at this site there was a 2.8 cm lymph node with maximum SUV 15.4 .Background mediastinal blood pool activity is 2.9. Accordingly this is Deauville 1, although the vast majority of the previous adenopathy is Deauville 0. Trace bilateral pleural effusions. The previous right perihilar opacity has resolved. ABDOMEN/PELVIS The extensive previous retroperitoneal and mesenteric adenopathy has resolved no hypermetabolic or pathologic adenopathy in the abdomen or pelvis is present. Suspected mild hemochromatosis with noncontrast hepatic density 75 Hounsfield units. Dextroconvex lumbar scoliosis. Uterus absent. Mild sigmoid colon diverticulosis. SKELETON No focal hypermetabolic activity to suggest skeletal metastasis. IMPRESSION: 1. Nearly completely resolved adenopathy in the neck, chest, abdomen, and pelvis, indicating excellent response to therapy. There is no residual hypermetabolic adenopathy, and in a previously large periaortic lymph node in the lower thorax, there is only some indistinct nodal tissue with activity below the mediastinal blood pool (Deauville 1). The vast majority of the previous adenopathy and right perihilar involvement is now Deauville 0. 2. Trace bilateral pleural effusions. 3. There is accentuated activity along a calcification  adjacent to the left oropharynx, in the vicinity of the stylopharyngeus and palatopharyngeus musculature. This is a bit posterior to be a sialolith, and may represent a dystrophic calcification. There is some localized hypermetabolic activity around this calcification, probably due to inflammation. Electronically Signed   By: Van Clines M.D.   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   PET/CT scan shows bulky hypermetabolic lymphadenopathy throughout the neck, chest, and abdomen. 1 cm hypermetabolic pelvic lymph node in right external iliac chain. Hypermetabolic masslike opacity in central right  lung with multiple adjacent hypermetabolic nodules. This is suspicious for pulmonary involvement by lymphoma  Fatigue anorexia or night sweats-drenching, and weight loss suggest constitutional type B symptoms.  Hepatitis profile and HIV neg  Patient is s/p 3 cycles of CHOEP tolerated well overall. PET/CT on 01/08/2017 - showed near completely resolved LNadenopathy. Grade 1 fatigue Grade 2 nausea/vomiting and some dehydration after the first cycle of treatment  needing IV anti-emetics and IVF. Much improved after adjustment of her supportive medications . Plan -labs are stable and patient appears to have recovered her counts -continue current supportive medications with Emend D1 and additional aloxi D3 from her 2nd cycle to optimize anti-emetics since patient is particular sensitive to nausea from Ctx -has prn zofran and compazine at home. Patient has been using twice daily Compazine which made her sleepy .she was recommended to take this only as needed.  -Proceed with her 4th cycle of treatment from today . --has been evaluated by Dr Dellis Filbert at [facility] on 12/01/2016. 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 -In the setting of asthma with her primary care physician consider transitioning to a calcium channel blocker if appropriate.  #4 history of sleep apnea has been noncompliant with her CPAP use. She does have a CPAP machine after counseling has been using this regularly and has been doing a better job with this .  #5 severe anxiety - this appears somewhat improved -Has been using Xanax when necessary.  -continue on Cymbalta 30 mg by mouth daily. -recommended staying as physically active as possible.  #6 persistent dry cough likely due to airway irritation from mediastinal adenopathy.  Appears to have resolved with starting treatment.  -Continue treatment as per schedule. -RTC with Dr Irene Limbo in 3 weeks with labs C5D1  All of the patients questions were answered with apparent satisfaction. The patient knows to call the clinic with any problems, questions or concerns.  I spent 20 minutes counseling the patient face to face. The total time spent in the appointment was 25 minutes and more than 50% was on counseling and direct patient care and co-ordination of cares.    Sullivan Lone MD [place] AAHIVMS [facility] Hematology/Oncology Physician [facility]  (Office):       [phone] (Work cell):  [phone] (Fax):           [phone]

## 2017-01-29 ENCOUNTER — Ambulatory Visit (INDEPENDENT_AMBULATORY_CARE_PROVIDER_SITE_OTHER): Payer: BC Managed Care – PPO | Admitting: Family Medicine

## 2017-01-29 VITALS — BP 120/80 | Wt 172.0 lb

## 2017-01-29 DIAGNOSIS — C8448 Peripheral T-cell lymphoma, not classified, lymph nodes of multiple sites: Secondary | ICD-10-CM

## 2017-01-29 DIAGNOSIS — D709 Neutropenia, unspecified: Secondary | ICD-10-CM

## 2017-01-29 DIAGNOSIS — R5081 Fever presenting with conditions classified elsewhere: Secondary | ICD-10-CM | POA: Diagnosis not present

## 2017-01-29 DIAGNOSIS — E099 Drug or chemical induced diabetes mellitus without complications: Secondary | ICD-10-CM

## 2017-01-29 DIAGNOSIS — T451X5A Adverse effect of antineoplastic and immunosuppressive drugs, initial encounter: Secondary | ICD-10-CM | POA: Diagnosis not present

## 2017-01-29 LAB — CBC WITH DIFFERENTIAL/PLATELET
Basophils Absolute: 0 cells/uL (ref 0–200)
Basophils Relative: 0 %
EOS PCT: 0 %
Eosinophils Absolute: 0 cells/uL — ABNORMAL LOW (ref 15–500)
HCT: 29.4 % — ABNORMAL LOW (ref 35.0–45.0)
HEMOGLOBIN: 10.1 g/dL — AB (ref 11.7–15.5)
LYMPHS ABS: 1313 {cells}/uL (ref 850–3900)
Lymphocytes Relative: 13 %
MCH: 29.4 pg (ref 27.0–33.0)
MCHC: 34.4 g/dL (ref 32.0–36.0)
MCV: 85.7 fL (ref 80.0–100.0)
MPV: 9.1 fL (ref 7.5–12.5)
Monocytes Absolute: 404 cells/uL (ref 200–950)
Monocytes Relative: 4 %
NEUTROS ABS: 8383 {cells}/uL — AB (ref 1500–7800)
Neutrophils Relative %: 83 %
Platelets: 167 10*3/uL (ref 140–400)
RBC: 3.43 MIL/uL — AB (ref 3.80–5.10)
RDW: 19.9 % — ABNORMAL HIGH (ref 11.0–15.0)
WBC: 10.1 10*3/uL (ref 4.0–10.5)

## 2017-01-29 LAB — COMPREHENSIVE METABOLIC PANEL
ALBUMIN: 3.6 g/dL (ref 3.6–5.1)
ALT: 18 U/L (ref 6–29)
AST: 13 U/L (ref 10–35)
Alkaline Phosphatase: 72 U/L (ref 33–130)
BILIRUBIN TOTAL: 0.3 mg/dL (ref 0.2–1.2)
BUN: 14 mg/dL (ref 7–25)
CO2: 25 mmol/L (ref 20–31)
CREATININE: 0.45 mg/dL — AB (ref 0.50–0.99)
Calcium: 8.6 mg/dL (ref 8.6–10.4)
Chloride: 99 mmol/L (ref 98–110)
Glucose, Bld: 148 mg/dL — ABNORMAL HIGH (ref 65–99)
Potassium: 3.7 mmol/L (ref 3.5–5.3)
SODIUM: 135 mmol/L (ref 135–146)
TOTAL PROTEIN: 5.5 g/dL — AB (ref 6.1–8.1)

## 2017-01-29 NOTE — Progress Notes (Signed)
   Subjective:    Patient ID: Denise [name], female    DOB: July 30, 1956, 61 y.o.   MRN: [id number]  HPI She is here for follow-up on recent hospitalization for treatment of neutropenic fever. She is being treated for esophagitis with antibiotics as well as statin. EGD was not done due to her neutropenia. Last white blood count was 8.6. Presently she is having no chest pain, sore throat, dysphagia. She is now eating regularly no fever, chills, fatigue or malaise   Review of Systems     Objective:   Physical Exam Alert and in no distress. Tympanic membranes and canals are normal. Pharyngeal area is normal. Neck is supple without adenopathy or thyromegaly. Cardiac exam shows a regular sinus rhythm without murmurs or gallops. Lungs are clear to auscultation. Review of blood work does show several elevated blood sugars. Hemoglobin A1c today is 7.2.       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)  She is scheduled for chemotherapy on Monday and apparently will finish chemotherapy at the end of July. I discussed the diabetes diagnosis with her and at this point we will be involved in active surveillance, keeping track of her blood sugars but do not feel the need to place her on any diabetes related medications. She is comfortable with this. She will getting blood work on a regular basis so we will be able to follow the blood sugars and if they start to get too high, medications can be administered. Otherwise I will see her in about 2 months. Over 25 minutes, greater than 50% spent in counseling and coordination of care.

## 2017-02-01 ENCOUNTER — Telehealth: Payer: Self-pay | Admitting: Hematology

## 2017-02-01 ENCOUNTER — Other Ambulatory Visit: Payer: Self-pay

## 2017-02-01 ENCOUNTER — Encounter: Payer: Self-pay | Admitting: Hematology

## 2017-02-01 ENCOUNTER — Other Ambulatory Visit (HOSPITAL_BASED_OUTPATIENT_CLINIC_OR_DEPARTMENT_OTHER): Payer: BC Managed Care – PPO

## 2017-02-01 ENCOUNTER — Ambulatory Visit (HOSPITAL_BASED_OUTPATIENT_CLINIC_OR_DEPARTMENT_OTHER): Payer: BC Managed Care – PPO

## 2017-02-01 ENCOUNTER — Other Ambulatory Visit: Payer: Self-pay | Admitting: Hematology

## 2017-02-01 ENCOUNTER — Ambulatory Visit (HOSPITAL_BASED_OUTPATIENT_CLINIC_OR_DEPARTMENT_OTHER): Payer: BC Managed Care – PPO | Admitting: Hematology

## 2017-02-01 VITALS — BP 152/79 | HR 63 | Temp 98.2°F | Resp 17 | Ht 64.0 in | Wt 173.3 lb

## 2017-02-01 DIAGNOSIS — C8448 Peripheral T-cell lymphoma, not classified, lymph nodes of multiple sites: Secondary | ICD-10-CM

## 2017-02-01 DIAGNOSIS — Z5111 Encounter for antineoplastic chemotherapy: Secondary | ICD-10-CM

## 2017-02-01 DIAGNOSIS — Z5189 Encounter for other specified aftercare: Secondary | ICD-10-CM

## 2017-02-01 DIAGNOSIS — D61818 Other pancytopenia: Secondary | ICD-10-CM

## 2017-02-01 DIAGNOSIS — C8478 Anaplastic large cell lymphoma, ALK-negative, lymph nodes of multiple sites: Secondary | ICD-10-CM

## 2017-02-01 LAB — CBC & DIFF AND RETIC
BASO%: 0 % (ref 0.0–2.0)
BASOS ABS: 0 10*3/uL (ref 0.0–0.1)
EOS%: 0 % (ref 0.0–7.0)
Eosinophils Absolute: 0 10*3/uL (ref 0.0–0.5)
HCT: 30.4 % — ABNORMAL LOW (ref 34.8–46.6)
HEMOGLOBIN: 10.2 g/dL — AB (ref 11.6–15.9)
Immature Retic Fract: 21.8 % — ABNORMAL HIGH (ref 1.60–10.00)
LYMPH#: 0.6 10*3/uL — AB (ref 0.9–3.3)
LYMPH%: 6.4 % — ABNORMAL LOW (ref 14.0–49.7)
MCH: 29.5 pg (ref 25.1–34.0)
MCHC: 33.6 g/dL (ref 31.5–36.0)
MCV: 87.9 fL (ref 79.5–101.0)
MONO#: 0.2 10*3/uL (ref 0.1–0.9)
MONO%: 2.3 % (ref 0.0–14.0)
NEUT%: 91.3 % — ABNORMAL HIGH (ref 38.4–76.8)
NEUTROS ABS: 8.9 10*3/uL — AB (ref 1.5–6.5)
Platelets: 171 10*3/uL (ref 145–400)
RBC: 3.46 10*6/uL — AB (ref 3.70–5.45)
RDW: 19.1 % — AB (ref 11.2–14.5)
RETIC %: 6.11 % — AB (ref 0.70–2.10)
RETIC CT ABS: 211.41 10*3/uL — AB (ref 33.70–90.70)
WBC: 9.7 10*3/uL (ref 3.9–10.3)
nRBC: 0 % (ref 0–0)

## 2017-02-01 MED ORDER — DOXORUBICIN HCL CHEMO IV INJECTION 2 MG/ML
50.0000 mg/m2 | Freq: Once | INTRAVENOUS | Status: AC
  Administered 2017-02-01: 100 mg via INTRAVENOUS
  Filled 2017-02-01: qty 50

## 2017-02-01 MED ORDER — SODIUM CHLORIDE 0.9 % IV SOLN
2.0000 mg | Freq: Once | INTRAVENOUS | Status: AC
  Administered 2017-02-01: 2 mg via INTRAVENOUS
  Filled 2017-02-01: qty 2

## 2017-02-01 MED ORDER — DEXAMETHASONE 4 MG PO TABS: ORAL_TABLET | ORAL | 0 refills | Status: DC

## 2017-02-01 MED ORDER — SODIUM CHLORIDE 0.9 % IV SOLN
Freq: Once | INTRAVENOUS | Status: AC
  Administered 2017-02-01: 14:00:00 via INTRAVENOUS

## 2017-02-01 MED ORDER — PEGFILGRASTIM 6 MG/0.6ML ~~LOC~~ PSKT
6.0000 mg | PREFILLED_SYRINGE | Freq: Once | SUBCUTANEOUS | Status: AC
  Administered 2017-02-01: 6 mg via SUBCUTANEOUS
  Filled 2017-02-01: qty 0.6

## 2017-02-01 MED ORDER — SODIUM CHLORIDE 0.9 % IV SOLN
750.0000 mg/m2 | Freq: Once | INTRAVENOUS | Status: AC
  Administered 2017-02-01: 1500 mg via INTRAVENOUS
  Filled 2017-02-01: qty 75

## 2017-02-01 MED ORDER — SODIUM CHLORIDE 0.9 % IV SOLN
Freq: Once | INTRAVENOUS | Status: AC
  Administered 2017-02-01: 14:00:00 via INTRAVENOUS
  Filled 2017-02-01: qty 5

## 2017-02-01 MED ORDER — PALONOSETRON HCL INJECTION 0.25 MG/5ML
INTRAVENOUS | Status: AC
  Filled 2017-02-01: qty 5

## 2017-02-01 MED ORDER — HEPARIN SOD (PORK) LOCK FLUSH 100 UNIT/ML IV SOLN
500.0000 [IU] | Freq: Once | INTRAVENOUS | Status: AC | PRN
  Administered 2017-02-01: 500 [IU]
  Filled 2017-02-01: qty 5

## 2017-02-01 MED ORDER — PALONOSETRON HCL INJECTION 0.25 MG/5ML
0.2500 mg | Freq: Once | INTRAVENOUS | Status: AC
  Administered 2017-02-01: 0.25 mg via INTRAVENOUS

## 2017-02-01 MED ORDER — SODIUM CHLORIDE 0.9% FLUSH
10.0000 mL | INTRAVENOUS | Status: DC | PRN
  Administered 2017-02-01: 10 mL
  Filled 2017-02-01: qty 10

## 2017-02-01 NOTE — Telephone Encounter (Signed)
Scheduled appt per 6/25 los - patient to get new schedule in the treatment area.

## 2017-02-01 NOTE — Telephone Encounter (Signed)
Medication reorder required.

## 2017-02-01 NOTE — Patient Instructions (Signed)
Thank you for choosing [facility] to provide your oncology and hematology care.  To afford each patient quality time with our providers, please arrive 30 minutes before your scheduled appointment time.  If you arrive late for your appointment, you may be asked to reschedule.  We strive to give you quality time with our providers, and arriving late affects you and other patients whose appointments are after yours.  If you are a no show for multiple scheduled visits, you may be dismissed from the clinic at the providers discretion.   Again, thank you for choosing [facility], our hope is that these requests will decrease the amount of time that you wait before being seen by our physicians.  ______________________________________________________________________ Should you have questions after your visit to the [facility], please contact our office at [phone] between the hours of 8:30 and 4:30 p.m.    Voicemails left after 4:30p.m will not be returned until the following business day.   For prescription refill requests, please have your pharmacy contact us directly.  Please also try to allow 48 hours for prescription requests.   Please contact the scheduling department for questions regarding scheduling.  For scheduling of procedures such as PET scans, CT scans, MRI, Ultrasound, etc please contact central scheduling at [phone].   Resources For Cancer Patients and Caregivers:  American Cancer Society:  800-227-2345  Can help patients locate various types of support and financial assistance Cancer Care: 1-800-813-HOPE (4673) Provides financial assistance, online support groups, medication/co-pay assistance.   [place] DSS:  336-641-3447 Where to apply for food stamps, Medicaid, and utility assistance Medicare Rights Center: 800-333-4114 Helps people with Medicare understand their rights and benefits, navigate the Medicare system, and secure the  quality healthcare they deserve SCAT: 336-333-6589 [place] [employer]'s shared-ride transportation service for eligible riders who have a disability that prevents them from riding the fixed route bus.   For additional information on assistance programs please contact our social worker:   Grier Hock/Abigail Elmore:  [phone] 

## 2017-02-01 NOTE — Patient Instructions (Signed)
[facility] Discharge Instructions for Patients Receiving Chemotherapy  Today you received the following chemotherapy agents Adriamycin, Cytoxan, Vincristine  To help prevent nausea and vomiting after your treatment, we encourage you to take your nausea medication as directed If you develop nausea and vomiting that is not controlled by your nausea medication, call the clinic.   BELOW ARE SYMPTOMS THAT SHOULD BE REPORTED IMMEDIATELY:  *FEVER GREATER THAN 100.5 F  *CHILLS WITH OR WITHOUT FEVER  NAUSEA AND VOMITING THAT IS NOT CONTROLLED WITH YOUR NAUSEA MEDICATION  *UNUSUAL SHORTNESS OF BREATH  *UNUSUAL BRUISING OR BLEEDING  TENDERNESS IN MOUTH AND THROAT WITH OR WITHOUT PRESENCE OF ULCERS  *URINARY PROBLEMS  *BOWEL PROBLEMS  UNUSUAL RASH Items with * indicate a potential emergency and should be followed up as soon as possible.  Feel free to call the clinic you have any questions or concerns. The clinic phone number is [phone].  Please show the [place] at check-in to the Emergency Department and triage nurse.

## 2017-02-01 NOTE — Progress Notes (Signed)
No etoposide tx today or tomorrow per Dr. Irene Limbo. Onpro to be administered today.

## 2017-02-02 ENCOUNTER — Ambulatory Visit: Payer: BC Managed Care – PPO

## 2017-02-02 NOTE — Progress Notes (Signed)
Denise [name]    HEMATOLOGY/ONCOLOGY CLINIC NOTE  Date of Service: .02/01/2017  Patient Care Team: Denita Lung, MD as PCP - General (Family Medicine)  CHIEF COMPLAINTS/PURPOSE OF CONSULTATION:  f/u for PTCL NOS  HISTORY OF PRESENTING ILLNESS:  plz see previous note for HPI  INTERVAL HISTORY  Denise [name] is here with her partner for her scheduled follow-up prior to her 5th cycle of chemotherapy. The neutropenia has resolved and she is feeling much better. No more mucositis. Overall feels quite well. Given her significant cytopenias we discussed and decided to drop the etoposide and go with CHOP alone. She is agreeable with this plan.  No fevers no chills no night sweats. No other palpable new lymph nodes.   MEDICAL HISTORY:  Past Medical History:  Diagnosis Date  . Allergy   . Asthma   . Bursitis of left hip   . Fibroids    TAH in 1999  . GERD (gastroesophageal reflux disease)   . Lymphoma ([place])    T cell lymphoma  . PVC's (premature ventricular contractions)   . Sleep apnea    does not use CPAP    SURGICAL HISTORY: Past Surgical History:  Procedure Laterality Date  . CARDIAC CATH  05/2007   J. BERRY  . HAMMER TOE SURGERY Bilateral 2016  . IR GENERIC HISTORICAL  11/05/2016   IR US GUIDE VASC ACCESS RIGHT 11/05/2016 [name], MD [facility]  . IR GENERIC HISTORICAL  11/05/2016   IR FLUORO GUIDE PORT INSERTION RIGHT 11/05/2016 [name], MD [facility]  . TOTAL ABDOMINAL HYSTERECTOMY  05/15/1998   secondary to fibroids  . WISDOM TOOTH EXTRACTION  mid teens    SOCIAL HISTORY: Social History   Social History  . Marital status: Significant Other    Spouse name: N/A  . Number of children: 0  . Years of education: N/A   Occupational History  . 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 Smoking status: Never Smoker  . Smokeless tobacco: Never Used  . Alcohol use 0.6 - 1.2 oz/week    1 - 2 Standard drinks or  equivalent per week     Comment: only if out to dinner, maybe once a month  . Drug use: No  . Sexual activity: Yes    Partners: Female    Birth control/ protection: None   Other Topics Concern  . Not on file   Social History Narrative  . No narrative on file    FAMILY HISTORY: Family History  Problem Relation Age of Onset  . Cancer Maternal Grandmother        GI cancer in 99's  . Dementia Mother   . Heart disease Father   . Heart failure Father     ALLERGIES:  is allergic to morphine and related.  MEDICATIONS:  Current Outpatient Prescriptions  Medication Sig Dispense Refill  . acyclovir (ZOVIRAX) 400 MG tablet TAKE 1 TABLET(400 MG) BY MOUTH TWICE DAILY (Patient not taking: Reported on 01/29/2017) 60 tablet 0  . ADVAIR DISKUS 100-50 MCG/DOSE AEPB Inhale 1 puff into the lungs daily as needed (sob and wheezing).     Denise [name] albuterol (PROVENTIL HFA;VENTOLIN HFA) 108 (90 Base) MCG/ACT inhaler Inhale 2 puffs into the lungs every 6 (six) hours as needed for wheezing or shortness of breath. 1 Inhaler 0  . ALPRAZolam (XANAX) 0.25 MG tablet TAKE 1 TABLET BY MOUTH TWICE A DAY AS NEEDED FOR ANXIETY 50 tablet 0  .  alum & mag hydroxide-simeth (MAALOX/MYLANTA) 200-200-20 MG/5ML suspension Take 30 mLs by mouth every 4 (four) hours as needed for indigestion. 355 mL 0  . aspirin 81 MG chewable tablet Chew 81 mg by mouth daily as needed for moderate pain.     Denise [name] atenolol (TENORMIN) 50 MG tablet TAKE 1 TABLET BY MOUTH TWICE DAILY. 180 tablet 1  . dexamethasone (DECADRON) 4 MG tablet TAKE 1 TABLET(4 MG) BY MOUTH DAILY WITH LUNCH 30 tablet 0  . DULoxetine (CYMBALTA) 30 MG capsule Take 1 capsule (30 mg total) by mouth daily. 30 capsule 3  . esomeprazole (NEXIUM) 40 MG capsule Take 1 capsule (40 mg total) by mouth daily before breakfast. (Patient taking differently: Take 40 mg by mouth daily as needed (indigestion). ) 100 capsule 0  . fluconazole (DIFLUCAN) 100 MG tablet Take 1 tablet (100 mg total) by  mouth daily. Take 2 tabs on day 1 then 1 tab po daily x 12 days 14 tablet 0  . hydrochlorothiazide (MICROZIDE) 12.5 MG capsule TAKE ONE CAPSULE BY MOUTH EVERY DAY 30 capsule 11  . lidocaine-prilocaine (EMLA) cream Apply to affected area once (Patient not taking: Reported on 01/29/2017) 30 g 3  . magic mouthwash w/lidocaine SOLN Take 5 mLs by mouth 4 (four) times daily as needed for mouth pain. 200 mL 0  . meloxicam (MOBIC) 15 MG tablet TK 1 T PO QD PRN FOR MUSCLE SPASMS  0  . nystatin (MYCOSTATIN) 100000 UNIT/ML suspension Take 5 mLs (500,000 Units total) by mouth 4 (four) times daily as needed (thrush). 100 mL 2  . ondansetron (ZOFRAN ODT) 8 MG disintegrating tablet Take 1 tablet (8 mg total) by mouth every 8 (eight) hours as needed for nausea or vomiting. 20 tablet 0  . predniSONE (DELTASONE) 20 MG tablet Take 4 tablets (80 mg total) with breakfast. Take days 1-5 with chemotherapy. (Patient taking differently: Take 60 mg by mouth daily with breakfast. Take 3 tablets (60 mg total) with breakfast. Take days 1-5 with chemotherapy.) 20 tablet 2  . predniSONE (DELTASONE) 20 MG tablet TAKE 4 TABLETS BY MOUTH WITH BREAKFAST. TAKE DAYS 1-5 WITH CHEMOTHERAPY 20 tablet 0  . prochlorperazine (COMPAZINE) 10 MG tablet Take 1 tablet (10 mg total) by mouth every 6 (six) hours as needed (Nausea or vomiting). 30 tablet 6  . sucralfate (CARAFATE) 1 GM/10ML suspension Take 10 mLs (1 g total) by mouth 4 (four) times daily -  with meals and at bedtime. 420 mL 0  . valACYclovir (VALTREX) 1000 MG tablet Take 1 tablet (1,000 mg total) by mouth 2 (two) times daily. (Patient not taking: Reported on 01/29/2017) 4 tablet 0   No current facility-administered medications for this visit.    Facility-Administered Medications Ordered in Other Visits  Medication Dose Route Frequency Provider Last Rate Last Dose  . sodium chloride flush (NS) 0.9 % injection 10 mL  10 mL Intracatheter PRN Brunetta Genera, MD   10 mL at 12/01/16  1731    REVIEW OF SYSTEMS:    10 Point review of Systems was done is negative except as noted above.  PHYSICAL EXAMINATION: ECOG PERFORMANCE STATUS: 1 - Symptomatic but completely ambulatory  . Vitals:   02/01/17 1235  BP: (!) 152/79  Pulse: 63  Resp: 17  Temp: 98.2 F (36.8 C)   Filed Weights   02/01/17 1235  Weight: 173 lb 4.8 oz (78.6 kg)   .Body mass index is 29.75 kg/m.  GENERAL:alert, in no acute distress and comfortable SKIN: skin  color, texture, turgor are normal, no rashes or significant lesions EYES: normal, conjunctiva are pink and non-injected, sclera clear OROPHARYNX:no exudate, no erythema and lips, buccal mucosa, and tongue normal  NECK: supple, no JVD,No inspiratory stridor, no facial swelling or upper extremity swelling. LYMPH: Extensive bilateral supraclavicular lymphadenopathy, no palpable inguinal or axillary lymphadenopathy noted. LUNGS: clear to auscultation with normal respiratory effort HEART: regular rate & rhythm,  no murmurs and no lower extremity edema ABDOMEN: abdomen soft, non-tender, normoactive bowel sounds  Musculoskeletal: no cyanosis of digits and no clubbing  PSYCH: alert & oriented x 3 with fluent speech NEURO: no focal motor/sensory deficits  LABORATORY DATA:  I have reviewed the data as listed  . CBC Latest Ref Rng & Units 02/01/2017 01/29/2017 01/24/2017  WBC 3.9 - 10.3 10e3/uL 9.7 10.1 8.6  Hemoglobin 11.6 - 15.9 g/dL 10.2(L) 10.1(L) 8.4(L)  Hematocrit 34.8 - 46.6 % 30.4(L) 29.4(L) 24.1(L)  Platelets 145 - 400 10e3/uL 171 167 42(L)   . CMP Latest Ref Rng & Units 01/29/2017 01/24/2017 01/23/2017  Glucose 65 - 99 mg/dL 148(H) 137(H) 180(H)  BUN 7 - 25 mg/dL '14 8 6  ' Creatinine 0.50 - 0.99 mg/dL 0.45(L) 0.46 0.42(L)  Sodium 135 - 146 mmol/L 135 135 134(L)  Potassium 3.5 - 5.3 mmol/L 3.7 3.4(L) 3.5  Chloride 98 - 110 mmol/L 99 103 103  CO2 20 - 31 mmol/L '25 27 23  ' Calcium 8.6 - 10.4 mg/dL 8.6 8.0(L) 7.9(L)  Total Protein 6.1 -  8.1 g/dL 5.5(L) - -  Total Bilirubin 0.2 - 1.2 mg/dL 0.3 - -  Alkaline Phos 33 - 130 U/L 72 - -  AST 10 - 35 U/L 13 - -  ALT 6 - 29 U/L 18 - -         RADIOGRAPHIC STUDIES: I have personally reviewed the radiological images as listed and agreed with the findings in the report. Dg Chest 2 View  Result Date: 01/18/2017 CLINICAL DATA:  Lymphoma, chest pain EXAM: CHEST  2 VIEW COMPARISON:  10/15/2016 FINDINGS: Heart size and vascularity normal. Negative for pneumonia. Negative for mass or effusion. Interval improvement in mediastinal and hilar adenopathy compared with the prior study. Port-A-Cath tip in the mid right atrium. IMPRESSION: No active cardiopulmonary disease. Electronically Signed   By: Franchot Gallo M.D.   On: 01/18/2017 16:09   Nm Pet Image Restag (ps) Skull Base To Thigh  Result Date: 01/08/2017 CLINICAL DATA:  Subsequent treatment strategy for T-cell lymphoma. EXAM: NUCLEAR MEDICINE PET SKULL BASE TO THIGH TECHNIQUE: 8.3 mCi F-18 FDG was injected intravenously. Full-ring PET imaging was performed from the skull base to thigh after the radiotracer. CT data was obtained and used for attenuation correction and anatomic localization. FASTING BLOOD GLUCOSE:  Value: 139 mg/dl COMPARISON:  Multiple exams, including 10/27/2016 FINDINGS: NECK The previous extensive hypermetabolic adenopathy in the neck has completely resolved (Deauville 0). Calcification along the left posterior oropharynx, not a usual location for a sialolith, faintly accentuated metabolic activity surrounding this calcification with maximum SUV 7.4 (formerly 4.0), probably inflammatory. This is in the vicinity of the left stylopharyngeus and palatopharyngeus musculature. CHEST The previous extensive hypermetabolic thoracic adenopathy has nearly completely resolved. Adjacent to the descending thoracic aorta near the hiatus there is some indistinct nodal tissue measuring up to 8 mm in short axis on image 97/4, maximum SUV 1.7.  Previously at this site there was a 2.8 cm lymph node with maximum SUV 15.4 .Background mediastinal blood pool activity is 2.9. Accordingly this is  Deauville 1, although the vast majority of the previous adenopathy is Deauville 0. Trace bilateral pleural effusions. The previous right perihilar opacity has resolved. ABDOMEN/PELVIS The extensive previous retroperitoneal and mesenteric adenopathy has resolved no hypermetabolic or pathologic adenopathy in the abdomen or pelvis is present. Suspected mild hemochromatosis with noncontrast hepatic density 75 Hounsfield units. Dextroconvex lumbar scoliosis. Uterus absent. Mild sigmoid colon diverticulosis. SKELETON No focal hypermetabolic activity to suggest skeletal metastasis. IMPRESSION: 1. Nearly completely resolved adenopathy in the neck, chest, abdomen, and pelvis, indicating excellent response to therapy. There is no residual hypermetabolic adenopathy, and in a previously large periaortic lymph node in the lower thorax, there is only some indistinct nodal tissue with activity below the mediastinal blood pool (Deauville 1). The vast majority of the previous adenopathy and right perihilar involvement is now Deauville 0. 2. Trace bilateral pleural effusions. 3. There is accentuated activity along a calcification adjacent to the left oropharynx, in the vicinity of the stylopharyngeus and palatopharyngeus musculature. This is a bit posterior to be a sialolith, and may represent a dystrophic calcification. There is some localized hypermetabolic activity around this calcification, probably due to inflammation. Electronically Signed   By: Van Clines M.D.   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   PET/CT scan shows bulky hypermetabolic lymphadenopathy throughout the neck, chest, and abdomen. 1 cm  hypermetabolic pelvic lymph node in right external iliac chain. Hypermetabolic masslike opacity in central right lung with multiple adjacent hypermetabolic nodules. This is suspicious for pulmonary involvement by lymphoma  Fatigue anorexia or night sweats-drenching, and weight loss suggest constitutional type B symptoms.  Hepatitis profile and HIV neg  Patient is s/p 4 cycles of CHOEP tolerated well overall. PET/CT on 01/08/2017 - showed near completely resolved LNadenopathy. Grade 1 fatigue Grade 2 nausea/vomiting and some dehydration after the first cycle of treatment  needing IV anti-emetics and IVF. Much improved after adjustment of her supportive medications .  #2 significant neutropenia and thrombocytopenia and anemia with neutropenic fevers leading to recent hospitalization. Cultures were unrevealing. This is now resolved. Plan -labs are stable and patient appears to have recovered from her significant recent pancytopenia and neutropenic fevers. -We discussed dropping the etoposide and ongoing with CHOP as opposed to CHOEP  for the last 2 planned cycles of treatment given her significant pancytopenia is and likely limiting bone marrow reserve at this point . She is agreeable with this plan . -She is stable to proceed with CHOP with G-CSF support today . -continue current supportive medications with Emend D1  -has prn zofran and compazine at home. Patient has been using twice daily Compazine which made her sleepy .she was recommended to take this only as needed.  -Proceed with her 5th cycle of treatment from today . --has been evaluated by Dr Dellis Filbert at [facility] on 12/01/2016. 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  She has a follow-up with Dr. Duanne Moron in the next week or so.   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  #4  history of sleep apnea has been noncompliant with her CPAP use. She does have a CPAP machine after counseling has been using this regularly and has been doing a better  job with this .  #5 severe anxiety - this appears somewhat improved -Has been using Xanax when necessary.  -continue on Cymbalta 30 mg by mouth daily. -recommended staying as physically active as possible.  #6 persistent dry cough likely due to airway irritation from mediastinal adenopathy.  Appears to have resolved with starting treatment.  Schedule for 6th cycle of CHOP (for 7/17 or 7/18) Cancel D2 and D3 of each cycle since Etoposide has been discontinued RTC with Dr Irene Limbo in 3 weeks with 6th cycle of CHOP.  All of the patients questions were answered with apparent satisfaction. The patient knows to call the clinic with any problems, questions or concerns.  I spent 20 minutes counseling the patient face to face. The total time spent in the appointment was 30 minutes and more than 50% was on counseling and direct patient care and co-ordination of cares.    Sullivan Lone MD [place] AAHIVMS [facility] Hematology/Oncology Physician [facility]  (Office):       [phone] (Work cell):  [phone] (Fax):           [phone]

## 2017-02-03 ENCOUNTER — Ambulatory Visit: Payer: BC Managed Care – PPO

## 2017-02-05 ENCOUNTER — Telehealth: Payer: Self-pay | Admitting: Family Medicine

## 2017-02-05 NOTE — Telephone Encounter (Signed)
[facility]  [phone] [place]  called for refill on Xanax 0.25 mg  Pt transferring to their pharmacy, so no previous refill info

## 2017-02-06 NOTE — Telephone Encounter (Signed)
ok 

## 2017-02-08 ENCOUNTER — Other Ambulatory Visit: Payer: Self-pay | Admitting: Hematology

## 2017-02-08 ENCOUNTER — Other Ambulatory Visit: Payer: Self-pay

## 2017-02-08 ENCOUNTER — Other Ambulatory Visit: Payer: Self-pay | Admitting: *Deleted

## 2017-02-08 ENCOUNTER — Telehealth: Payer: Self-pay | Admitting: *Deleted

## 2017-02-08 DIAGNOSIS — Z638 Other specified problems related to primary support group: Secondary | ICD-10-CM

## 2017-02-08 MED ORDER — FLUCONAZOLE 100 MG PO TABS: 100.0000 mg | ORAL_TABLET | Freq: Every day | ORAL | 0 refills | Status: DC

## 2017-02-08 MED ORDER — SUCRALFATE 1 GM/10ML PO SUSP: 1.0000 g | Freq: Three times a day (TID) | ORAL | 0 refills | Status: DC

## 2017-02-08 MED ORDER — ALPRAZOLAM 0.25 MG PO TABS: ORAL_TABLET | ORAL | 0 refills | Status: DC

## 2017-02-08 NOTE — Telephone Encounter (Signed)
Called in refill  

## 2017-02-08 NOTE — Telephone Encounter (Signed)
Pt called stating "my throat is starting to hurt again, and I don't want to end up in the hospital like last time."  Pt asking for refill of carafate and diflucan bc those are what helped her throat after recent hospital discharge.  This RN explained to pt that Dr. Irene Limbo was out of country for two weeks.  Pt stated Dr. Irene Limbo had ordered meds previously.  This statement was verified.  This RN explained to pt that 1 refill of each medication would be ordered, but if symptoms persisted, pt would need to be seen by PCP/ED.  Pt verbalized understanding/thankful for help.

## 2017-02-08 NOTE — Telephone Encounter (Signed)
Called in xanax per [employer]

## 2017-02-15 ENCOUNTER — Telehealth: Payer: Self-pay | Admitting: *Deleted

## 2017-02-15 NOTE — Telephone Encounter (Signed)
Returned patient's phone call regarding billing questions and needing an itemized list. Informed her to call [phone] or [phone] toll free.

## 2017-02-19 ENCOUNTER — Telehealth: Payer: Self-pay

## 2017-02-19 NOTE — Telephone Encounter (Signed)
Fax sent to [place] with magic mouthwash prescription. Confirmed fax receipt.

## 2017-02-19 NOTE — Telephone Encounter (Signed)
Error

## 2017-02-22 ENCOUNTER — Other Ambulatory Visit: Payer: Self-pay | Admitting: Family Medicine

## 2017-02-22 ENCOUNTER — Other Ambulatory Visit: Payer: Self-pay | Admitting: Hematology

## 2017-02-23 ENCOUNTER — Other Ambulatory Visit: Payer: Self-pay | Admitting: *Deleted

## 2017-02-23 ENCOUNTER — Other Ambulatory Visit: Payer: Self-pay

## 2017-02-23 DIAGNOSIS — C8448 Peripheral T-cell lymphoma, not classified, lymph nodes of multiple sites: Secondary | ICD-10-CM

## 2017-02-23 NOTE — Telephone Encounter (Signed)
Dr.Lalonde looks like this med was discontinued

## 2017-02-24 ENCOUNTER — Ambulatory Visit (HOSPITAL_BASED_OUTPATIENT_CLINIC_OR_DEPARTMENT_OTHER): Payer: BC Managed Care – PPO | Admitting: Hematology

## 2017-02-24 ENCOUNTER — Encounter: Payer: Self-pay | Admitting: Hematology

## 2017-02-24 ENCOUNTER — Ambulatory Visit (HOSPITAL_BASED_OUTPATIENT_CLINIC_OR_DEPARTMENT_OTHER): Payer: BC Managed Care – PPO

## 2017-02-24 ENCOUNTER — Telehealth: Payer: Self-pay | Admitting: Hematology

## 2017-02-24 ENCOUNTER — Other Ambulatory Visit (HOSPITAL_BASED_OUTPATIENT_CLINIC_OR_DEPARTMENT_OTHER): Payer: BC Managed Care – PPO

## 2017-02-24 VITALS — BP 158/70 | HR 63 | Temp 98.0°F | Resp 18 | Ht 64.0 in | Wt 179.8 lb

## 2017-02-24 DIAGNOSIS — Z5111 Encounter for antineoplastic chemotherapy: Secondary | ICD-10-CM | POA: Diagnosis not present

## 2017-02-24 DIAGNOSIS — C8478 Anaplastic large cell lymphoma, ALK-negative, lymph nodes of multiple sites: Secondary | ICD-10-CM | POA: Insufficient documentation

## 2017-02-24 DIAGNOSIS — C8448 Peripheral T-cell lymphoma, not classified, lymph nodes of multiple sites: Secondary | ICD-10-CM

## 2017-02-24 DIAGNOSIS — Z5189 Encounter for other specified aftercare: Secondary | ICD-10-CM

## 2017-02-24 DIAGNOSIS — Z8572 Personal history of non-Hodgkin lymphomas: Secondary | ICD-10-CM | POA: Insufficient documentation

## 2017-02-24 LAB — COMPREHENSIVE METABOLIC PANEL
ALBUMIN: 3.2 g/dL — AB (ref 3.5–5.0)
ALK PHOS: 90 U/L (ref 40–150)
ALT: 28 U/L (ref 0–55)
AST: 13 U/L (ref 5–34)
Anion Gap: 9 mEq/L (ref 3–11)
BILIRUBIN TOTAL: 0.38 mg/dL (ref 0.20–1.20)
BUN: 14.1 mg/dL (ref 7.0–26.0)
CALCIUM: 8.6 mg/dL (ref 8.4–10.4)
CO2: 26 mEq/L (ref 22–29)
CREATININE: 0.7 mg/dL (ref 0.6–1.1)
Chloride: 97 mEq/L — ABNORMAL LOW (ref 98–109)
EGFR: >90 mL/min/{1.73_m2} (ref >90)
GLUCOSE: 293 mg/dL — AB (ref 70–140)
POTASSIUM: 4.5 meq/L (ref 3.5–5.1)
SODIUM: 133 meq/L — AB (ref 136–145)
TOTAL PROTEIN: 5.5 g/dL — AB (ref 6.4–8.3)

## 2017-02-24 LAB — CBC WITH DIFFERENTIAL/PLATELET
BASO%: 0.3 % (ref 0.0–2.0)
BASOS ABS: 0 10*3/uL (ref 0.0–0.1)
EOS ABS: 0 10*3/uL (ref 0.0–0.5)
EOS%: 0 % (ref 0.0–7.0)
HEMATOCRIT: 29.9 % — AB (ref 34.8–46.6)
HEMOGLOBIN: 10.1 g/dL — AB (ref 11.6–15.9)
LYMPH%: 1.6 % — ABNORMAL LOW (ref 14.0–49.7)
MCH: 31.8 pg (ref 25.1–34.0)
MCHC: 33.6 g/dL (ref 31.5–36.0)
MCV: 94.7 fL (ref 79.5–101.0)
MONO#: 0.9 10*3/uL (ref 0.1–0.9)
MONO%: 7.3 % (ref 0.0–14.0)
NEUT%: 90.8 % — ABNORMAL HIGH (ref 38.4–76.8)
NEUTROS ABS: 10.9 10*3/uL — AB (ref 1.5–6.5)
Platelets: 158 10*3/uL (ref 145–400)
RBC: 3.16 10*6/uL — ABNORMAL LOW (ref 3.70–5.45)
RDW: 20.3 % — AB (ref 11.2–14.5)
WBC: 12 10*3/uL — AB (ref 3.9–10.3)
lymph#: 0.2 10*3/uL — ABNORMAL LOW (ref 0.9–3.3)

## 2017-02-24 MED ORDER — HEPARIN SOD (PORK) LOCK FLUSH 100 UNIT/ML IV SOLN
500.0000 [IU] | Freq: Once | INTRAVENOUS | Status: AC | PRN
Start: 2017-02-24 — End: 2017-02-24
  Administered 2017-02-24: 500 [IU]
  Filled 2017-02-24: qty 5

## 2017-02-24 MED ORDER — ALUM & MAG HYDROXIDE-SIMETH 200-200-20 MG/5ML PO SUSP: 30.0000 mL | ORAL | 0 refills | Status: DC | PRN

## 2017-02-24 MED ORDER — SODIUM CHLORIDE 0.9 % IV SOLN
Freq: Once | INTRAVENOUS | Status: AC
  Administered 2017-02-24: 13:00:00 via INTRAVENOUS
  Filled 2017-02-24: qty 5

## 2017-02-24 MED ORDER — PALONOSETRON HCL INJECTION 0.25 MG/5ML
INTRAVENOUS | Status: AC
Start: 2017-02-24 — End: 2017-02-24
  Filled 2017-02-24: qty 5

## 2017-02-24 MED ORDER — SODIUM CHLORIDE 0.9 % IV SOLN
750.0000 mg/m2 | Freq: Once | INTRAVENOUS | Status: AC
  Administered 2017-02-24: 1500 mg via INTRAVENOUS
  Filled 2017-02-24: qty 75

## 2017-02-24 MED ORDER — PEGFILGRASTIM 6 MG/0.6ML ~~LOC~~ PSKT
6.0000 mg | PREFILLED_SYRINGE | Freq: Once | SUBCUTANEOUS | Status: AC
  Administered 2017-02-24: 6 mg via SUBCUTANEOUS
  Filled 2017-02-24: qty 0.6

## 2017-02-24 MED ORDER — SODIUM CHLORIDE 0.9 % IV SOLN
Freq: Once | INTRAVENOUS | Status: AC
  Administered 2017-02-24: 13:00:00 via INTRAVENOUS

## 2017-02-24 MED ORDER — SODIUM CHLORIDE 0.9% FLUSH
10.0000 mL | INTRAVENOUS | Status: DC | PRN
  Administered 2017-02-24: 10 mL
  Filled 2017-02-24: qty 10

## 2017-02-24 MED ORDER — VINCRISTINE SULFATE CHEMO INJECTION 1 MG/ML
2.0000 mg | Freq: Once | INTRAVENOUS | Status: AC
  Administered 2017-02-24: 2 mg via INTRAVENOUS
  Filled 2017-02-24: qty 2

## 2017-02-24 MED ORDER — PALONOSETRON HCL INJECTION 0.25 MG/5ML
0.2500 mg | Freq: Once | INTRAVENOUS | Status: AC
  Administered 2017-02-24: 0.25 mg via INTRAVENOUS

## 2017-02-24 MED ORDER — SUCRALFATE 1 GM/10ML PO SUSP: 1.0000 g | Freq: Three times a day (TID) | ORAL | 0 refills | Status: DC

## 2017-02-24 MED ORDER — DOXORUBICIN HCL CHEMO IV INJECTION 2 MG/ML
50.0000 mg/m2 | Freq: Once | INTRAVENOUS | Status: AC
  Administered 2017-02-24: 100 mg via INTRAVENOUS
  Filled 2017-02-24: qty 50

## 2017-02-24 NOTE — Patient Instructions (Signed)
Thank you for choosing [facility] to provide your oncology and hematology care.  To afford each patient quality time with our providers, please arrive 30 minutes before your scheduled appointment time.  If you arrive late for your appointment, you may be asked to reschedule.  We strive to give you quality time with our providers, and arriving late affects you and other patients whose appointments are after yours.   If you are a no show for multiple scheduled visits, you may be dismissed from the clinic at the providers discretion.    Again, thank you for choosing [facility], our hope is that these requests will decrease the amount of time that you wait before being seen by our physicians.  ______________________________________________________________________  Should you have questions after your visit to the [facility], please contact our office at [phone] between the hours of 8:30 and 4:30 p.m.    Voicemails left after 4:30p.m will not be returned until the following business day.    For prescription refill requests, please have your pharmacy contact us directly.  Please also try to allow 48 hours for prescription requests.    Please contact the scheduling department for questions regarding scheduling.  For scheduling of procedures such as PET scans, CT scans, MRI, Ultrasound, etc please contact central scheduling at [phone].    Resources For Cancer Patients and Caregivers:   Oncolink.org:  A wonderful resource for patients and healthcare providers for information regarding your disease, ways to tract your treatment, what to expect, etc.     American Cancer Society:  800-227-2345  Can help patients locate various types of support and financial assistance  Cancer Care: 1-800-813-HOPE (4673) Provides financial assistance, online support groups, medication/co-pay assistance.    [place] DSS:  336-641-3447 Where to apply for food  stamps, Medicaid, and utility assistance  Medicare Rights Center: 800-333-4114 Helps people with Medicare understand their rights and benefits, navigate the Medicare system, and secure the quality healthcare they deserve  SCAT: 336-333-6589 [place] [employer]'s shared-ride transportation service for eligible riders who have a disability that prevents them from riding the fixed route bus.    For additional information on assistance programs please contact our social worker:   Grier Hock/Abigail Elmore:  [phone]            

## 2017-02-24 NOTE — Telephone Encounter (Signed)
Scheduled appt per 7/18 los - Gave patient AVS and calender per los. - lab and f.u in 3 months.

## 2017-02-25 ENCOUNTER — Other Ambulatory Visit: Payer: Self-pay | Admitting: Hematology

## 2017-02-25 NOTE — Progress Notes (Signed)
Denise [name] Kitchen    HEMATOLOGY/ONCOLOGY CLINIC NOTE  Date of Service: .02/24/2017  Patient Care Team: Denita Lung, MD as PCP - General (Family Medicine)  CHIEF COMPLAINTS/PURPOSE OF CONSULTATION:  f/u for PTCL NOS  HISTORY OF PRESENTING ILLNESS:  plz see previous note for HPI  INTERVAL HISTORY  Denise [name] is here with her partner for her scheduled follow-up prior to her 6th cycle of chemotherapy. She tolerated her last cycle of CHOP well without any acute concerns. Labs today are stable.  No fevers no chills no night sweats. No other palpable new lymph nodes. She has met with Dr. Duanne Moron and is currently undergoing a transplant evaluation and has been given a calendar which includes a day 0 for an autologous transplantation in the first week of September with stem cell collection prior to that. A PET/CT scan after completion of her sixth and final planned cycle of chemotherapy has been scheduled at [facility] with Dr. Duanne Moron.   MEDICAL HISTORY:  Past Medical History:  Diagnosis Date  . Allergy   . Asthma   . Bursitis of left hip   . Fibroids    TAH in 1999  . GERD (gastroesophageal reflux disease)   . Lymphoma ([place])    T cell lymphoma  . PVC's (premature ventricular contractions)   . Sleep apnea    does not use CPAP    SURGICAL HISTORY: Past Surgical History:  Procedure Laterality Date  . CARDIAC CATH  05/2007   J. BERRY  . HAMMER TOE SURGERY Bilateral 2016  . IR GENERIC HISTORICAL  11/05/2016   IR US GUIDE VASC ACCESS RIGHT 11/05/2016 [name], MD [facility]  . IR GENERIC HISTORICAL  11/05/2016   IR FLUORO GUIDE PORT INSERTION RIGHT 11/05/2016 [name], MD [facility]  . TOTAL ABDOMINAL HYSTERECTOMY  05/15/1998   secondary to fibroids  . WISDOM TOOTH EXTRACTION  mid teens    SOCIAL HISTORY: Social History   Social History  . Marital status: Significant Other    Spouse name: N/A  . Number of children: 0  . Years of education: N/A   Occupational  History  . 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 Smoking status: Never Smoker  . Smokeless tobacco: Never Used  . Alcohol use 0.6 - 1.2 oz/week    1 - 2 Standard drinks or equivalent per week     Comment: only if out to dinner, maybe once a month  . Drug use: No  . Sexual activity: Yes    Partners: Female    Birth control/ protection: None   Other Topics Concern  . Not on file   Social History Narrative  . No narrative on file    FAMILY HISTORY: Family History  Problem Relation Age of Onset  . Cancer Maternal Grandmother        GI cancer in 80's  . Dementia Mother   . Heart disease Father   . Heart failure Father     ALLERGIES:  is allergic to morphine and related.  MEDICATIONS:  Current Outpatient Prescriptions  Medication Sig Dispense Refill  . acyclovir (ZOVIRAX) 400 MG tablet TAKE 1 TABLET(400 MG) BY MOUTH TWICE DAILY 60 tablet 0  . ADVAIR DISKUS 100-50 MCG/DOSE AEPB Inhale 1 puff into the lungs daily as needed (sob and wheezing).     Denise [name] Kitchen albuterol (PROVENTIL HFA;VENTOLIN HFA) 108 (90 Base) MCG/ACT inhaler Inhale 2 puffs into the lungs every 6 (six)  hours as needed for wheezing or shortness of breath. 1 Inhaler 0  . ALPRAZolam (XANAX) 0.25 MG tablet TAKE 1 TABLET BY MOUTH TWICE A DAY AS NEEDED FOR ANXIETY 50 tablet 0  . alum & mag hydroxide-simeth (MAALOX/MYLANTA) 200-200-20 MG/5ML suspension Take 30 mLs by mouth every 4 (four) hours as needed for indigestion. 355 mL 0  . aspirin 81 MG chewable tablet Chew 81 mg by mouth daily as needed for moderate pain.     Denise [name] Kitchen atenolol (TENORMIN) 50 MG tablet TAKE 1 TABLET BY MOUTH TWICE DAILY. 180 tablet 1  . dexamethasone (DECADRON) 4 MG tablet TAKE 1 TABLET(4 MG) BY MOUTH DAILY WITH LUNCH 30 tablet 0  . DULoxetine (CYMBALTA) 30 MG capsule TAKE 1 CAPSULE(30 MG) BY MOUTH DAILY 30 capsule 0  . esomeprazole (NEXIUM) 40 MG capsule Take 1 capsule (40 mg total) by mouth daily  before breakfast. (Patient taking differently: Take 40 mg by mouth daily as needed (indigestion). ) 100 capsule 0  . fluconazole (DIFLUCAN) 100 MG tablet Take 1 tablet (100 mg total) by mouth daily. Take 2 tabs on day 1 then 1 tab po daily x 12 days 14 tablet 0  . hydrochlorothiazide (MICROZIDE) 12.5 MG capsule TAKE ONE CAPSULE BY MOUTH EVERY DAY 30 capsule 11  . lidocaine-prilocaine (EMLA) cream Apply to affected area once 30 g 3  . magic mouthwash w/lidocaine SOLN Take 5 mLs by mouth 4 (four) times daily as needed for mouth pain. 200 mL 0  . meloxicam (MOBIC) 15 MG tablet TK 1 T PO QD PRN FOR MUSCLE SPASMS  0  . nystatin (MYCOSTATIN) 100000 UNIT/ML suspension Take 5 mLs (500,000 Units total) by mouth 4 (four) times daily as needed (thrush). 100 mL 2  . ondansetron (ZOFRAN ODT) 8 MG disintegrating tablet Take 1 tablet (8 mg total) by mouth every 8 (eight) hours as needed for nausea or vomiting. 20 tablet 0  . predniSONE (DELTASONE) 20 MG tablet Take 4 tablets (80 mg total) with breakfast. Take days 1-5 with chemotherapy. (Patient taking differently: Take 60 mg by mouth daily with breakfast. Take 3 tablets (60 mg total) with breakfast. Take days 1-5 with chemotherapy.) 20 tablet 2  . predniSONE (DELTASONE) 20 MG tablet TAKE 4 TABLETS BY MOUTH WITH BREAKFAST. TAKE DAYS 1-5 WITH CHEMOTHERAPY 20 tablet 0  . prochlorperazine (COMPAZINE) 10 MG tablet Take 1 tablet (10 mg total) by mouth every 6 (six) hours as needed (Nausea or vomiting). 30 tablet 6  . sucralfate (CARAFATE) 1 GM/10ML suspension Take 10 mLs (1 g total) by mouth 4 (four) times daily -  with meals and at bedtime. 420 mL 0  . valACYclovir (VALTREX) 1000 MG tablet Take 1 tablet (1,000 mg total) by mouth 2 (two) times daily. 4 tablet 0   No current facility-administered medications for this visit.    Facility-Administered Medications Ordered in Other Visits  Medication Dose Route Frequency Provider Last Rate Last Dose  . sodium chloride flush  (NS) 0.9 % injection 10 mL  10 mL Intracatheter PRN Brunetta Genera, MD   10 mL at 12/01/16 1731    REVIEW OF SYSTEMS:    10 Point review of Systems was done is negative except as noted above.  PHYSICAL EXAMINATION: ECOG PERFORMANCE STATUS: 1 - Symptomatic but completely ambulatory  . Vitals:   02/24/17 1045  BP: (!) 158/70  Pulse: 63  Resp: 18  Temp: 98 F (36.7 C)   Filed Weights   02/24/17 1045  Weight: 179 lb  12.8 oz (81.6 kg)   .Body mass index is 30.86 kg/m.  GENERAL:alert, in no acute distress and comfortable SKIN: skin color, texture, turgor are normal, no rashes or significant lesions EYES: normal, conjunctiva are pink and non-injected, sclera clear OROPHARYNX:no exudate, no erythema and lips, buccal mucosa, and tongue normal  NECK: supple, no JVD,No inspiratory stridor, no facial swelling or upper extremity swelling. LYMPH: Extensive bilateral supraclavicular lymphadenopathy, no palpable inguinal or axillary lymphadenopathy noted. LUNGS: clear to auscultation with normal respiratory effort HEART: regular rate & rhythm,  no murmurs and no lower extremity edema ABDOMEN: abdomen soft, non-tender, normoactive bowel sounds  Musculoskeletal: no cyanosis of digits and no clubbing  PSYCH: alert & oriented x 3 with fluent speech NEURO: no focal motor/sensory deficits  LABORATORY DATA:  I have reviewed the data as listed  . CBC Latest Ref Rng & Units 02/24/2017 02/01/2017 01/29/2017  WBC 3.9 - 10.3 10e3/uL 12.0(H) 9.7 10.1  Hemoglobin 11.6 - 15.9 g/dL 10.1(L) 10.2(L) 10.1(L)  Hematocrit 34.8 - 46.6 % 29.9(L) 30.4(L) 29.4(L)  Platelets 145 - 400 10e3/uL 158 171 167   . CMP Latest Ref Rng & Units 02/24/2017 01/29/2017 01/24/2017  Glucose 70 - 140 mg/dl 293(H) 148(H) 137(H)  BUN 7.0 - 26.0 mg/dL 14._0 Creatinine 0.6 - 1.1 mg/dL 0.7 0.45(L) 0.46  Sodium 136 - 145 mEq/L 133(L) 135 135  Potassium 3.5 - 5.1 mEq/L 4.5 3.7 3.4(L)  Chloride 98 - 110 mmol/L - 99 103    CO2 22 - 29 mEq/L _1 Calcium 8.4 - 10.4 mg/dL 8.6 8.6 8.0(L)  Total Protein 6.4 - 8.3 g/dL 5.5(L) 5.5(L) -  Total Bilirubin 0.20 - 1.20 mg/dL 0.38 0.3 -  Alkaline Phos 40 - 150 U/L 90 72 -  AST 5 - 34 U/L 13 13 -  ALT 0 - 55 U/L 28 18 -         RADIOGRAPHIC STUDIES: I have personally reviewed the radiological images as listed and agreed with the findings in the report. No results found.  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   PET/CT scan shows bulky hypermetabolic lymphadenopathy throughout the neck, chest, and abdomen. 1 cm hypermetabolic pelvic lymph node in right external iliac chain. Hypermetabolic masslike opacity in central right lung with multiple adjacent hypermetabolic nodules. This is suspicious for pulmonary involvement by lymphoma  Fatigue anorexia or night sweats-drenching, and weight loss suggest constitutional type B symptoms.  Hepatitis profile and HIV neg  Patient is s/p 4 cycles of CHOEP tolerated well overall and received CHOP for 5th cycle due to significant cytopenias after 4th cycle of CHOEP. PET/CT on 01/08/2017 - showed near completely resolved LNadenopathy. Grade 1 fatigue Grade 2 nausea/vomiting and some dehydration after the first cycle of treatment  needing IV anti-emetics and IVF. Much improved after adjustment of her supportive medications .  #2 significant neutropenia and thrombocytopenia and anemia with neutropenic fevers leading to recent hospitalization. Cultures were unrevealing. This is now resolved. Plan -- patient tolerated her fifth cycle of CHOP with G-CSF support without any acute issues. -Labs today are stable and she will proceed with her 6th cycle of treatment from today . -She has been scheduled for her reevaluation PET/CT scan with Dr. 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 She has a calendar for her transplant evaluation schedule stem cell collection with the planned day 0 and assessment of September. -We discussed that she will call us if there are any acute issues however we would defer the PET scan and transplant evaluation and post transplant cares for at least the first 100 days to her transplant team. -I shall plan to see her back in 3 months with labs to monitor her progress. She is aware that she can call us if she has any additional questions or concerns.   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  #4 history of sleep apnea has been noncompliant with her CPAP use. She does have a CPAP machine after counseling has been using this regularly and has been doing a better job with this .  #5 severe anxiety - this has improved significantly. -Has been using Xanax when necessary.  -continue on Cymbalta 30 mg by mouth daily. -recommended staying as physically active as possible.  #6 persistent dry cough likely due to airway irritation from mediastinal adenopathy.  Appears to have resolved with starting treatment.  -complete chemotherapy cycle 6 today -PET/CT at [facility] -RTC with Dr Irene Limbo in 3 months with labs  All of the patients questions were answered with apparent satisfaction. The patient knows to call the clinic with any problems, questions or concerns.  I spent 20 minutes counseling the patient face to face. The total time spent in the appointment was 30 minutes and more than 50% was on counseling and direct patient care and co-ordination of cares.    Sullivan Lone MD [place] AAHIVMS [facility] Hematology/Oncology Physician [facility]  (Office):       [phone] (Work cell):  [phone] (Fax):           [phone]

## 2017-02-26 ENCOUNTER — Telehealth: Payer: Self-pay

## 2017-02-26 MED ORDER — MELOXICAM 15 MG PO TABS: ORAL_TABLET | ORAL | 1 refills | Status: DC

## 2017-02-26 NOTE — Telephone Encounter (Signed)
Pt states that she needs refill of Mobic called to [facility] on [address]. She states she has been taking this intermittently for years and not sure why Dr. Irene Limbo d/c it in May, but she has been taking it. Please refill. Victorino December

## 2017-03-01 ENCOUNTER — Telehealth: Payer: Self-pay | Admitting: *Deleted

## 2017-03-01 NOTE — Telephone Encounter (Addendum)
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 [phone]).  A PET scan was ordered and authorized by Dr. Fabienne Bruns that will now be done here.  I need to speak with someone in charge of authorizations.  Your authorization needs to be cancelled in order for [facility] to obtain authorization here for our provider.  PET scheduled at [facility] on August 7 th, 2018.  Thanks."     Message left for Managed Care Team Lead with this request.

## 2017-03-02 ENCOUNTER — Other Ambulatory Visit: Payer: Self-pay | Admitting: Hematology

## 2017-03-02 ENCOUNTER — Other Ambulatory Visit: Payer: Self-pay

## 2017-03-03 ENCOUNTER — Telehealth: Payer: Self-pay

## 2017-03-03 ENCOUNTER — Other Ambulatory Visit: Payer: Self-pay

## 2017-03-03 NOTE — Telephone Encounter (Signed)
Per Dr. Irene Limbo, pt should not need any more steroids. Completed treatment.

## 2017-03-08 ENCOUNTER — Encounter: Payer: Self-pay | Admitting: Hematology

## 2017-03-08 NOTE — Progress Notes (Signed)
Pt called stating that she has met her out of pocket with her insurance and she has overpaid.  Her ins told her the hospital will reimburse her.  I gave her the numbers for customer service for hospital billing and for physician billing.  She will follow up with those two departments.  Documented in billing notes as well.

## 2017-03-09 ENCOUNTER — Telehealth: Payer: Self-pay | Admitting: Obstetrics & Gynecology

## 2017-03-09 NOTE — Telephone Encounter (Signed)
Left patient a message to call back to reschedule a future appointment that was cancelled by the provider for [facility].

## 2017-03-15 ENCOUNTER — Telehealth: Payer: Self-pay | Admitting: *Deleted

## 2017-03-15 ENCOUNTER — Other Ambulatory Visit: Payer: Self-pay | Admitting: *Deleted

## 2017-03-15 MED ORDER — CLINDAMYCIN PHOSPHATE 1 % EX GEL
Freq: Two times a day (BID) | CUTANEOUS | 0 refills | Status: DC
Start: 2017-03-15 — End: 2017-06-28

## 2017-03-15 NOTE — Telephone Encounter (Signed)
Pt called c/o acne on face only since Friday with no improvement.  Pt stated it is all over face, redness, no swelling.  Pt denies fever, chills, or any other concerns.  Discussed with Dr. Irene Limbo, most likely related to steroids.  Clindamycin topical gel sent to pts pharmacy.  Message left with pt to use gel BID in addition to an over the counter benzoyl peroxide.  Pt informed in message that if that does not help she will need to be seen for further evaluation.

## 2017-03-29 ENCOUNTER — Other Ambulatory Visit: Payer: Self-pay | Admitting: Hematology

## 2017-04-01 ENCOUNTER — Ambulatory Visit: Payer: BC Managed Care – PPO | Admitting: Family Medicine

## 2017-04-13 DIAGNOSIS — Z9484 Stem cells transplant status: Secondary | ICD-10-CM | POA: Insufficient documentation

## 2017-04-26 ENCOUNTER — Telehealth: Payer: Self-pay

## 2017-04-26 NOTE — Telephone Encounter (Signed)
Left 3 different voice mails concerning upcoming appointment for Wed.19th @10  am. Patient has not return call. Per 9/17 los

## 2017-04-27 ENCOUNTER — Telehealth: Payer: Self-pay | Admitting: Hematology

## 2017-04-27 NOTE — Telephone Encounter (Signed)
Spoke with patient and she cannot see Dr.Kale this week due to just getting out of the hospital.

## 2017-04-28 ENCOUNTER — Ambulatory Visit: Payer: BC Managed Care – PPO | Admitting: Hematology

## 2017-04-28 ENCOUNTER — Telehealth: Payer: Self-pay | Admitting: *Deleted

## 2017-04-28 ENCOUNTER — Other Ambulatory Visit: Payer: BC Managed Care – PPO

## 2017-04-28 ENCOUNTER — Other Ambulatory Visit: Payer: Self-pay | Admitting: *Deleted

## 2017-04-28 DIAGNOSIS — C8442 Peripheral T-cell lymphoma, not classified, intrathoracic lymph nodes: Secondary | ICD-10-CM

## 2017-04-28 NOTE — Telephone Encounter (Signed)
Pt coordinator for [facility] called to inquire abt requested pt apts.  Informed her that Ms. Chauncey did not show up for scheduled apts today and per recent phone note, stated to scheduler she did not need apt.  Coordinator will contact pt to have her come to [facility] tomorrow for blood work only.  Scheduling message sent.  Verified labs needed.

## 2017-04-29 ENCOUNTER — Telehealth: Payer: Self-pay

## 2017-04-29 ENCOUNTER — Other Ambulatory Visit (HOSPITAL_BASED_OUTPATIENT_CLINIC_OR_DEPARTMENT_OTHER): Payer: BC Managed Care – PPO

## 2017-04-29 DIAGNOSIS — C8442 Peripheral T-cell lymphoma, not classified, intrathoracic lymph nodes: Secondary | ICD-10-CM

## 2017-04-29 DIAGNOSIS — C8448 Peripheral T-cell lymphoma, not classified, lymph nodes of multiple sites: Secondary | ICD-10-CM | POA: Diagnosis not present

## 2017-04-29 LAB — CBC WITH DIFFERENTIAL/PLATELET
BASO%: 0.9 % (ref 0.0–2.0)
Basophils Absolute: 0 10*3/uL (ref 0.0–0.1)
EOS ABS: 0 10*3/uL (ref 0.0–0.5)
EOS%: 0.2 % (ref 0.0–7.0)
HCT: 28.6 % — ABNORMAL LOW (ref 34.8–46.6)
HGB: 9.5 g/dL — ABNORMAL LOW (ref 11.6–15.9)
LYMPH%: 25 % (ref 14.0–49.7)
MCH: 29.6 pg (ref 25.1–34.0)
MCHC: 33.2 g/dL (ref 31.5–36.0)
MCV: 89.1 fL (ref 79.5–101.0)
MONO#: 0.9 10*3/uL (ref 0.1–0.9)
MONO%: 20.3 % — AB (ref 0.0–14.0)
NEUT%: 53.6 % (ref 38.4–76.8)
NEUTROS ABS: 2.3 10*3/uL (ref 1.5–6.5)
PLATELETS: 62 10*3/uL — AB (ref 145–400)
RBC: 3.21 10*6/uL — AB (ref 3.70–5.45)
RDW: 14.7 % — ABNORMAL HIGH (ref 11.2–14.5)
WBC: 4.3 10*3/uL (ref 3.9–10.3)
lymph#: 1.1 10*3/uL (ref 0.9–3.3)
nRBC: 0 % (ref 0–0)

## 2017-04-29 LAB — COMPREHENSIVE METABOLIC PANEL
ALT: 12 U/L (ref 0–55)
ANION GAP: 10 meq/L (ref 3–11)
AST: 22 U/L (ref 5–34)
Albumin: 3.4 g/dL — ABNORMAL LOW (ref 3.5–5.0)
Alkaline Phosphatase: 93 U/L (ref 40–150)
BUN: 8.3 mg/dL (ref 7.0–26.0)
CHLORIDE: 104 meq/L (ref 98–109)
CO2: 25 meq/L (ref 22–29)
Calcium: 9.6 mg/dL (ref 8.4–10.4)
Creatinine: 0.7 mg/dL (ref 0.6–1.1)
Glucose: 122 mg/dl (ref 70–140)
Potassium: 3.8 mEq/L (ref 3.5–5.1)
Sodium: 139 mEq/L (ref 136–145)
Total Bilirubin: 0.26 mg/dL (ref 0.20–1.20)
Total Protein: 6.5 g/dL (ref 6.4–8.3)

## 2017-04-29 LAB — TECHNOLOGIST REVIEW

## 2017-04-29 LAB — LACTATE DEHYDROGENASE: LDH: 257 U/L — ABNORMAL HIGH (ref 125–245)

## 2017-04-29 LAB — MAGNESIUM: MAGNESIUM: 1.7 mg/dL (ref 1.5–2.5)

## 2017-04-29 NOTE — Telephone Encounter (Signed)
Left VM with pt to let her know that she will not require blood or platelet transfusion at this time. Dr. Irene Limbo reviewed lab work. Hgb 9.5 and Plt 62, which is low, but per MD pt lab work is "normalizing" based on her numbers previously.

## 2017-05-17 ENCOUNTER — Other Ambulatory Visit: Payer: Self-pay | Admitting: Family Medicine

## 2017-05-17 NOTE — Telephone Encounter (Signed)
Is this okay to refill? 

## 2017-05-18 ENCOUNTER — Other Ambulatory Visit: Payer: Self-pay | Admitting: Family Medicine

## 2017-05-18 NOTE — Telephone Encounter (Signed)
ok 

## 2017-05-18 NOTE — Telephone Encounter (Signed)
Called in med to pharmacy  

## 2017-05-18 NOTE — Telephone Encounter (Signed)
Is this okay to refill? 

## 2017-05-27 ENCOUNTER — Other Ambulatory Visit (HOSPITAL_BASED_OUTPATIENT_CLINIC_OR_DEPARTMENT_OTHER): Payer: BC Managed Care – PPO

## 2017-05-27 ENCOUNTER — Encounter: Payer: Self-pay | Admitting: Hematology

## 2017-05-27 ENCOUNTER — Telehealth: Payer: Self-pay | Admitting: Hematology

## 2017-05-27 ENCOUNTER — Ambulatory Visit (HOSPITAL_BASED_OUTPATIENT_CLINIC_OR_DEPARTMENT_OTHER): Payer: BC Managed Care – PPO | Admitting: Hematology

## 2017-05-27 VITALS — BP 136/78 | HR 76 | Temp 97.1°F | Resp 18 | Ht 64.0 in | Wt 172.0 lb

## 2017-05-27 DIAGNOSIS — Z9481 Bone marrow transplant status: Secondary | ICD-10-CM | POA: Diagnosis not present

## 2017-05-27 DIAGNOSIS — D696 Thrombocytopenia, unspecified: Secondary | ICD-10-CM | POA: Diagnosis not present

## 2017-05-27 DIAGNOSIS — C8448 Peripheral T-cell lymphoma, not classified, lymph nodes of multiple sites: Secondary | ICD-10-CM | POA: Diagnosis not present

## 2017-05-27 DIAGNOSIS — F329 Major depressive disorder, single episode, unspecified: Secondary | ICD-10-CM

## 2017-05-27 DIAGNOSIS — C8478 Anaplastic large cell lymphoma, ALK-negative, lymph nodes of multiple sites: Secondary | ICD-10-CM

## 2017-05-27 DIAGNOSIS — R432 Parageusia: Secondary | ICD-10-CM | POA: Diagnosis not present

## 2017-05-27 DIAGNOSIS — F418 Other specified anxiety disorders: Secondary | ICD-10-CM | POA: Diagnosis not present

## 2017-05-27 DIAGNOSIS — F32A Depression, unspecified: Secondary | ICD-10-CM

## 2017-05-27 LAB — CBC & DIFF AND RETIC
BASO%: 0.2 % (ref 0.0–2.0)
BASOS ABS: 0 10*3/uL (ref 0.0–0.1)
EOS%: 2.1 % (ref 0.0–7.0)
Eosinophils Absolute: 0.1 10*3/uL (ref 0.0–0.5)
HEMATOCRIT: 31.4 % — AB (ref 34.8–46.6)
HGB: 10.4 g/dL — ABNORMAL LOW (ref 11.6–15.9)
Immature Retic Fract: 11.3 % — ABNORMAL HIGH (ref 1.60–10.00)
LYMPH%: 8.8 % — AB (ref 14.0–49.7)
MCH: 29.5 pg (ref 25.1–34.0)
MCHC: 33.1 g/dL (ref 31.5–36.0)
MCV: 89.2 fL (ref 79.5–101.0)
MONO#: 0.6 10*3/uL (ref 0.1–0.9)
MONO%: 9.7 % (ref 0.0–14.0)
NEUT#: 4.6 10*3/uL (ref 1.5–6.5)
NEUT%: 79.2 % — AB (ref 38.4–76.8)
Platelets: 115 10*3/uL — ABNORMAL LOW (ref 145–400)
RBC: 3.52 10*6/uL — ABNORMAL LOW (ref 3.70–5.45)
RDW: 15.2 % — ABNORMAL HIGH (ref 11.2–14.5)
RETIC CT ABS: 56.32 10*3/uL (ref 33.70–90.70)
Retic %: 1.6 % (ref 0.70–2.10)
WBC: 5.8 10*3/uL (ref 3.9–10.3)
lymph#: 0.5 10*3/uL — ABNORMAL LOW (ref 0.9–3.3)

## 2017-05-27 LAB — COMPREHENSIVE METABOLIC PANEL
ALT: 16 U/L (ref 0–55)
AST: 23 U/L (ref 5–34)
Albumin: 3.3 g/dL — ABNORMAL LOW (ref 3.5–5.0)
Alkaline Phosphatase: 78 U/L (ref 40–150)
Anion Gap: 10 mEq/L (ref 3–11)
BILIRUBIN TOTAL: 0.34 mg/dL (ref 0.20–1.20)
BUN: 9.5 mg/dL (ref 7.0–26.0)
CO2: 25 meq/L (ref 22–29)
CREATININE: 0.8 mg/dL (ref 0.6–1.1)
Calcium: 9 mg/dL (ref 8.4–10.4)
Chloride: 104 mEq/L (ref 98–109)
GLUCOSE: 126 mg/dL (ref 70–140)
Potassium: 3.4 mEq/L — ABNORMAL LOW (ref 3.5–5.1)
SODIUM: 138 meq/L (ref 136–145)
TOTAL PROTEIN: 6.3 g/dL — AB (ref 6.4–8.3)

## 2017-05-27 LAB — LACTATE DEHYDROGENASE: LDH: 225 U/L (ref 125–245)

## 2017-05-27 NOTE — Patient Instructions (Signed)
Thank you for choosing [facility] to provide your oncology and hematology care.  To afford each patient quality time with our providers, please arrive 30 minutes before your scheduled appointment time.  If you arrive late for your appointment, you may be asked to reschedule.  We strive to give you quality time with our providers, and arriving late affects you and other patients whose appointments are after yours.   If you are a no show for multiple scheduled visits, you may be dismissed from the clinic at the providers discretion.    Again, thank you for choosing [facility], our hope is that these requests will decrease the amount of time that you wait before being seen by our physicians.  ______________________________________________________________________  Should you have questions after your visit to the [facility], please contact our office at [phone] between the hours of 8:30 and 4:30 p.m.    Voicemails left after 4:30p.m will not be returned until the following business day.    For prescription refill requests, please have your pharmacy contact us directly.  Please also try to allow 48 hours for prescription requests.    Please contact the scheduling department for questions regarding scheduling.  For scheduling of procedures such as PET scans, CT scans, MRI, Ultrasound, etc please contact central scheduling at [phone].    Resources For Cancer Patients and Caregivers:   Oncolink.org:  A wonderful resource for patients and healthcare providers for information regarding your disease, ways to tract your treatment, what to expect, etc.     American Cancer Society:  800-227-2345  Can help patients locate various types of support and financial assistance  Cancer Care: 1-800-813-HOPE (4673) Provides financial assistance, online support groups, medication/co-pay assistance.    [place] DSS:  336-641-3447 Where to apply for food  stamps, Medicaid, and utility assistance  Medicare Rights Center: 800-333-4114 Helps people with Medicare understand their rights and benefits, navigate the Medicare system, and secure the quality healthcare they deserve  SCAT: 336-333-6589 [place] [employer]'s shared-ride transportation service for eligible riders who have a disability that prevents them from riding the fixed route bus.    For additional information on assistance programs please contact our social worker:   Grier Hock/Abigail Elmore:  [phone]            

## 2017-05-27 NOTE — Progress Notes (Signed)
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 She presents today with her partner. Of note, the patient received autologous stem cell transplant performed at [facility] on 04/23/17. She tolerated this very well and has been recovering adequately over the past six weeks. Things have been doing well for her overall and she reports that she is ready to get back to her normal routine. WBC has normalized and without evidence of neutropenia. Labs are normalizing, platelets have improved to 115k. She does reports that she has been more depressed lately which she reports is because she has been contained more at home lately. This has been causing her some sleeping issues. She was taken off of her Cymbalta prior to her transplant and is wondering if she can be restarted on this. Additionally, she reports that she was placed on a potassium supplement related persistent hypokalemia. She has had some more loose bowel recently, but no overt problems with diarrhea. Chemistries are pending as of right now, we will follow up on this. Of note, she also states that she has had decreased appetite related to some ongoing ageusia and dysgeusia. Her partner relates this issue with her depressed mood and she has noticed that she tends not to eat during bouts of her depression. She has Day 100 follow up scheduled with Dr Duanne Moron on December 20th. She has PET/CT scheduled around that time to evaluate response.  On review of systems, pt denies fever, chills, rash, weight loss, urinary complaints. Denies pain. Pt denies abdominal pain, nausea,  vomiting. She endorses notable symptoms as listed in interval history as above.   MEDICAL HISTORY:  Past Medical History:  Diagnosis Date  . Allergy   . Asthma   . Bursitis of left hip   . Fibroids    TAH in 1999  . GERD (gastroesophageal reflux disease)   . Lymphoma ([place])    T cell lymphoma  . PVC's (premature ventricular contractions)   . Sleep apnea    does not use CPAP    SURGICAL HISTORY: Past Surgical History:  Procedure Laterality Date  . CARDIAC CATH  05/2007   J. BERRY  . HAMMER TOE SURGERY Bilateral 2016  . IR GENERIC HISTORICAL  11/05/2016   IR US GUIDE VASC ACCESS RIGHT 11/05/2016 [name], MD [facility]  . IR GENERIC HISTORICAL  11/05/2016   IR FLUORO GUIDE PORT INSERTION RIGHT 11/05/2016 [name], MD [facility]  . TOTAL ABDOMINAL HYSTERECTOMY  05/15/1998   secondary to fibroids  . WISDOM TOOTH EXTRACTION  mid teens    SOCIAL HISTORY: Social History   Social History  . Marital status: Significant Other    Spouse name: N/A  . Number of children: 0  . Years of education: N/A   Occupational History  . School Education officer, museum at [employer] History Main Topics  . Smoking status: Never Smoker  . Smokeless tobacco: Never Used  . Alcohol use 0.6 - 1.2 oz/week    1 - 2 Standard drinks or equivalent per week     Comment: only if out to dinner, maybe  once a month  . Drug use: No  . Sexual activity: Yes    Partners: Female    Birth control/ protection: None   Other Topics Concern  . Not on file   Social History Narrative  . No narrative on file    FAMILY HISTORY: Family History  Problem Relation Age of Onset  . Cancer Maternal Grandmother        GI cancer in 96's  . Dementia Mother   . Heart disease Father   . Heart failure Father     ALLERGIES:  is allergic to morphine and related.  MEDICATIONS:  Current Outpatient Prescriptions  Medication Sig Dispense Refill  . acyclovir (ZOVIRAX) 400 MG  tablet TAKE 1 TABLET(400 MG) BY MOUTH TWICE DAILY 60 tablet 0  . ADVAIR DISKUS 100-50 MCG/DOSE AEPB Inhale 1 puff into the lungs daily as needed (sob and wheezing).     Denise Kitchen albuterol (PROVENTIL HFA;VENTOLIN HFA) 108 (90 Base) MCG/ACT inhaler Inhale 2 puffs into the lungs every 6 (six) hours as needed for wheezing or shortness of breath. 1 Inhaler 0  . ALPRAZolam (XANAX) 0.25 MG tablet TAKE 1 TABLET BY MOUTH TWICE A DAY AS NEEDED FOR ANXIETY 50 tablet 0  . alum & mag hydroxide-simeth (MAALOX/MYLANTA) 200-200-20 MG/5ML suspension Take 30 mLs by mouth every 4 (four) hours as needed for indigestion. 355 mL 0  . aspirin 81 MG chewable tablet Chew 81 mg by mouth daily as needed for moderate pain.     Denise Kitchen atenolol (TENORMIN) 50 MG tablet TAKE 1 TABLET BY MOUTH TWICE DAILY. 180 tablet 1  . clindamycin (CLINDAGEL) 1 % gel Apply topically 2 (two) times daily. 30 g 0  . DULoxetine (CYMBALTA) 30 MG capsule TAKE 1 CAPSULE(30 MG) BY MOUTH DAILY 30 capsule 0  . esomeprazole (NEXIUM) 40 MG capsule Take 1 capsule (40 mg total) by mouth daily before breakfast. (Patient taking differently: Take 40 mg by mouth daily as needed (indigestion). ) 100 capsule 0  . fluconazole (DIFLUCAN) 100 MG tablet Take 1 tablet (100 mg total) by mouth daily. Take 2 tabs on day 1 then 1 tab po daily x 12 days 14 tablet 0  . hydrochlorothiazide (MICROZIDE) 12.5 MG capsule TAKE ONE CAPSULE BY MOUTH EVERY DAY 30 capsule 11  . lidocaine-prilocaine (EMLA) cream Apply to affected area once 30 g 3  . magic mouthwash w/lidocaine SOLN Take 5 mLs by mouth 4 (four) times daily as needed for mouth pain. 200 mL 0  . meloxicam (MOBIC) 15 MG tablet TAKE 1 TABLET BY MOUTH EVERY DAY AS NEEDED FOR MUSCLE SPASMS 30 tablet 1  . nystatin (MYCOSTATIN) 100000 UNIT/ML suspension Take 5 mLs (500,000 Units total) by mouth 4 (four) times daily as needed (thrush). 100 mL 2  . ondansetron (ZOFRAN ODT) 8 MG disintegrating tablet Take 1 tablet (8 mg total) by mouth every 8  (eight) hours as needed for nausea or vomiting. 20 tablet 0  . predniSONE (DELTASONE) 20 MG tablet TAKE 4 TABLETS BY MOUTH WITH BREAKFAST. TAKE DAYS 1-5 WITH CHEMOTHERAPY 20 tablet 0  . prochlorperazine (COMPAZINE) 10 MG tablet Take 1 tablet (10 mg total) by mouth every 6 (six) hours as needed (Nausea or vomiting). 30 tablet 6  . sucralfate (CARAFATE) 1 GM/10ML suspension Take 10 mLs (1 g total) by mouth 4 (four) times daily -  with meals and at bedtime. 420 mL 0  . triazolam (HALCION) 0.25 MG tablet TAKE 1 TABLET BY MOUTH EVERY NIGHT AT BEDTIME AS NEEDED  FOR SLEEP 30 tablet 0  . valACYclovir (VALTREX) 1000 MG tablet Take 1 tablet (1,000 mg total) by mouth 2 (two) times daily. 4 tablet 0   No current facility-administered medications for this visit.    Facility-Administered Medications Ordered in Other Visits  Medication Dose Route Frequency Provider Last Rate Last Dose  . sodium chloride flush (NS) 0.9 % injection 10 mL  10 mL Intracatheter PRN Brunetta Genera, MD   10 mL at 12/01/16 1731    REVIEW OF SYSTEMS:    A 10+ POINT REVIEW OF SYSTEMS WAS OBTAINED including neurology, dermatology, psychiatry, cardiac, respiratory, lymph, extremities, GI, GU, Musculoskeletal, constitutional, breasts, reproductive, HEENT.  All pertinent positives are noted in the HPI.  All others are negative.  PHYSICAL EXAMINATION: ECOG PERFORMANCE STATUS: 1 - Symptomatic but completely ambulatory  . Vitals:   05/27/17 0951  BP: 136/78  Pulse: 76  Resp: 18  Temp: (!) 97.1 F (36.2 C)  SpO2: 100%   Filed Weights   05/27/17 0951  Weight: 172 lb (78 kg)   .Body mass index is 29.52 kg/m.  GENERAL:alert, in no acute distress and comfortable SKIN: skin color, texture, turgor are normal, no rashes or significant lesions EYES: normal, conjunctiva are pink and non-injected, sclera clear OROPHARYNX:no exudate, no erythema and lips, buccal mucosa, and tongue normal  NECK: supple, no JVD,No inspiratory  stridor, no facial swelling or upper extremity swelling. LYMPH: Extensive bilateral supraclavicular lymphadenopathy, no palpable inguinal or axillary lymphadenopathy noted. LUNGS: clear to auscultation with normal respiratory effort HEART: regular rate & rhythm,  no murmurs and no lower extremity edema ABDOMEN: abdomen soft, non-tender, normoactive bowel sounds  Musculoskeletal: no cyanosis of digits and no clubbing  PSYCH: alert & oriented x 3 with fluent speech NEURO: no focal motor/sensory deficits  LABORATORY DATA:  I have reviewed the data as listed  . CBC Latest Ref Rng & Units 05/27/2017 04/29/2017 02/24/2017  WBC 3.9 - 10.3 10e3/uL 5.8 4.3 12.0(H)  Hemoglobin 11.6 - 15.9 g/dL 10.4(L) 9.5(L) 10.1(L)  Hematocrit 34.8 - 46.6 % 31.4(L) 28.6(L) 29.9(L)  Platelets 145 - 400 10e3/uL 115(L) 62(L) 158   . CMP Latest Ref Rng & Units 05/27/2017 04/29/2017 02/24/2017  Glucose 70 - 140 mg/dl 126 122 293(H)  BUN 7.0 - 26.0 mg/dL 9.5 8.3 14.1  Creatinine 0.6 - 1.1 mg/dL 0.8 0.7 0.7  Sodium 136 - 145 mEq/L 138 139 133(L)  Potassium 3.5 - 5.1 mEq/L 3.4(L) 3.8 4.5  Chloride 98 - 110 mmol/L - - -  CO2 22 - 29 mEq/L '25 25 26  ' Calcium 8.4 - 10.4 mg/dL 9.0 9.6 8.6  Total Protein 6.4 - 8.3 g/dL 6.3(L) 6.5 5.5(L)  Total Bilirubin 0.20 - 1.20 mg/dL 0.34 0.26 0.38  Alkaline Phos 40 - 150 U/L 78 93 90  AST 5 - 34 U/L '23 22 13  ' ALT 0 - 55 U/L '16 12 28         ' RADIOGRAPHIC STUDIES: I have personally reviewed the radiological images as listed and agreed with the findings in the report. No results found.  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   PET/CT scan shows bulky hypermetabolic lymphadenopathy throughout the neck, chest, and abdomen. 1 cm hypermetabolic pelvic lymph node in right external iliac chain. Hypermetabolic masslike opacity in central right  lung with multiple adjacent hypermetabolic nodules. This is suspicious for  pulmonary involvement by lymphoma  Fatigue anorexia or night sweats-drenching, and weight loss suggest constitutional type B symptoms.  Hepatitis profile and HIV neg  Patient is s/p 5 cycles of CHOEP tolerated well overall and received CHOP for 6th cycle due to significant cytopenias after 5th cycle of CHOEP. PET/CT on 01/08/2017 - showed near completely resolved LNadenopathy.  #2 significant neutropenia and thrombocytopenia and anemia with neutropenic fevers leading to recent hospitalization. Cultures were unrevealing. This is now resolved. Plan -- patient tolerated five cycles of CHOEP with G-CSF support and one cycle of CHOP without any acute issues. -She received her autologous stem cell transplant with Dr Duanne Moron at [facility] on 04/13/2017 which she has tolerated well and is recovering well and resolving cytopenias. -Labs today are stable and improving well. Her platelets have improved to 115k and WBC has normalized to 5.8 with no neutropenia. We discussed with her that she should still wear her mask in crowded areas and take sanitary precautions as normal.  -She has Day 100 f/u with Dr Duanne Moron on Dec. 20th.  -I informed her to hold off on influenza vaccination as of right now secondary to concern for lack of response.  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 As of now, I am hesitant to give her a definitive answer, but as long as she continues to heal well and her counts normalize, I project she could return as long as she has reasonable accommodations at work.   -I shall plan to see her back in 1 months with labs to monitor her progress. She is aware that she can call us if she has any additional questions or concerns.   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  #4 history of sleep apnea has been noncompliant with her CPAP  use. She does have a CPAP machine after counseling has been using this regularly and has been doing a better job with this .  #5 severe anxiety - this has improved significantly. -Has been using Xanax when necessary.  -Cymbalta was stopped prior to transplant, we will restart this.  -recommended staying as physically active as possible.  #6 persistent dry cough likely due to airway irritation from mediastinal adenopathy.  Appears to have resolved with starting treatment.  #7 ageusia and dysgeusia residual from treatment -I informed her to begin trying to season her foods more to improve the taste qualities. -I also encouraged her to begin gargling with baking soda to also help with this. -Refer to outpt rehab to hopefully assist with this in combination with Cymbalta to improve her depressed mood.    #8 depressed mood -Previously on Cymbalta prior to transplant. We will refer her to outpatient rehab in combination with Cymbalta to hopefully help normalize her mood.   -RTC with Dr Irene Limbo in 1 months with labs -outpatient rehab referral   All of the patients questions were answered with apparent satisfaction. The patient knows to call the clinic with any problems, questions or concerns.  I spent 30 minutes counseling the patient face to face. The total time spent in the appointment was 40 minutes and more than 50% was on counseling and direct patient care and co-ordination of cares.    Sullivan Lone MD [place] AAHIVMS [facility] Hematology/Oncology Physician [facility]  (Office):       [phone] (Work cell):  [phone] (Fax):  [phone]  This document serves as a record of services personally performed by Sullivan Lone, MD. It was created on his behalf by Reola Mosher, a trained medical scribe. The creation of this record is based on the scribe's personal observations and the provider's statements to them. This document has been checked and approved by the  attending provider.

## 2017-05-27 NOTE — Telephone Encounter (Signed)
Scheduled appt per 10/18 los - Gave patient AVS and calender per los.  

## 2017-05-28 MED ORDER — DULOXETINE HCL 30 MG PO CPEP: ORAL_CAPSULE | ORAL | 2 refills | Status: DC

## 2017-06-01 ENCOUNTER — Other Ambulatory Visit: Payer: Self-pay | Admitting: Family Medicine

## 2017-06-03 ENCOUNTER — Ambulatory Visit: Payer: BC Managed Care – PPO | Attending: Hematology | Admitting: Physical Therapy

## 2017-06-03 DIAGNOSIS — R29898 Other symptoms and signs involving the musculoskeletal system: Secondary | ICD-10-CM | POA: Diagnosis present

## 2017-06-03 DIAGNOSIS — M545 Low back pain, unspecified: Secondary | ICD-10-CM

## 2017-06-03 DIAGNOSIS — R2689 Other abnormalities of gait and mobility: Secondary | ICD-10-CM | POA: Insufficient documentation

## 2017-06-03 DIAGNOSIS — M6281 Muscle weakness (generalized): Secondary | ICD-10-CM | POA: Diagnosis present

## 2017-06-03 NOTE — Therapy (Signed)
[place], [place], [address] Phone: [phone]   Fax:  [phone]  Physical Therapy Evaluation  Patient Details  Name: Denise [name] MRN: [id number] Date of Birth: [birth year] Referring Provider: Dr. Sullivan Lone  Encounter Date: 06/03/2017      PT End of Session - 06/03/17 1750    Visit Number 1   Number of Visits 9   Date for PT Re-Evaluation 07/16/17   PT Start Time 1525   PT Stop Time 1610   PT Time Calculation (min) 45 min   Activity Tolerance Patient tolerated treatment well   Behavior During Therapy [facility] for tasks assessed/performed      Past Medical History:  Diagnosis Date  . Allergy   . Asthma   . Bursitis of left hip   . Fibroids    TAH in 1999  . GERD (gastroesophageal reflux disease)   . Lymphoma ([place])    T cell lymphoma  . PVC's (premature ventricular contractions)   . Sleep apnea    does not use CPAP    Past Surgical History:  Procedure Laterality Date  . CARDIAC CATH  05/2007   J. BERRY  . HAMMER TOE SURGERY Bilateral 2016  . IR GENERIC HISTORICAL  11/05/2016   IR US GUIDE VASC ACCESS RIGHT 11/05/2016 [name], MD [facility]  . IR GENERIC HISTORICAL  11/05/2016   IR FLUORO GUIDE PORT INSERTION RIGHT 11/05/2016 [name], MD [facility]  . TOTAL ABDOMINAL HYSTERECTOMY  05/15/1998   secondary to fibroids  . WISDOM TOOTH EXTRACTION  mid teens    There were no vitals filed for this visit.       Subjective Assessment - 06/03/17 1528    Subjective I saw Dr. Posey Pronto at [facility] did the bone marrow transplant--Dr. Posey Pronto was a physical person; I can't stand up without using my arms. Not being able to stand up and maneuver like she wanted me to is why I'm here.  Sometimes it hurts back here in my low back.  "I'm 50 days out from my bone marrow transplant and the first 100 days is important." Also has constant pain at distomedial aspect of right shoulder  blade. Has always had trouble climbing stairs.    Pertinent History Diagnosed with lymphoma 09/23/16. Had CT scan and met with oncologist within two days, then [place] and chemotherapy, 6 rounds at [facility]. 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 Dellis Filbert at [facility].  I'm 50 days out from that and my immune system's kind of a baby.  HCTZ pill is for bloating, not high blood pressure; atenolol for irregular heartbeat since 2008. No back surgeries or joint replacements; had hysterectomy when she was 39; had two hammer toes fixed two years ago.   Patient Stated Goals get stronger, move better, not be uncomfortable   Currently in Pain? Yes   Pain Score 8    Pain Location Scapula   Pain Orientation Right;Lower;Medial   Pain Descriptors / Indicators Sharp   Aggravating Factors  taking a breath in   Pain Relieving Factors lying down            [facility] PT Assessment - 06/03/17 0001      Assessment   Medical Diagnosis lymphoma s/p bone marrow transplant   Referring Provider Dr. Sullivan Lone   Onset Date/Surgical Date 09/23/16   Hand Dominance Right   Prior Therapy none     Precautions  Precautions Other (comment)   Precaution Comments still with compromised immune system cancer precautions     Restrictions   Weight Bearing Restrictions No     Balance Screen   Has the patient fallen in the past 6 months No   Has the patient had a decrease in activity level because of a fear of falling?  No   Is the patient reluctant to leave their home because of a fear of falling?  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; does some office work   Leisure no regular exercise     Cognition   Overall Cognitive Status Within Functional Limits for tasks assessed     Functional Tests   Functional tests Sit to Stand     Sit to Stand   Comments couldn't stand up from 17 inch tall mat without using hands, but could from 19 inch tall mat     Posture/Postural Control   Posture/Postural Control Postural limitations   Postural Limitations Forward head     ROM / Strength   AROM / PROM / Strength AROM;Strength     AROM   Overall AROM Comments neck, UEs and LEs grossly WFL throughout; also trunk AROM in standing WFL with slight limitations     Strength   Strength Assessment Site Hip;Knee;Ankle   Right/Left Hip Right;Left   Right Hip Flexion 4-/5   Right Hip Extension 3/5   Right Hip ABduction 3+/5   Right Hip ADduction 4/5   Left Hip Flexion 4-/5   Left Hip Extension 3/5   Left Hip ABduction 3+/5   Left Hip ADduction 4/5   Right/Left Knee Right;Left   Right Knee Flexion 4/5   Right Knee Extension 5/5   Left Knee Flexion 5/5   Left Knee Extension 5/5   Right/Left Ankle Right;Left   Right Ankle Dorsiflexion 4/5   Right Ankle Plantar Flexion --  able to toe walk   Left Ankle Dorsiflexion 4/5   Left Ankle Plantar Flexion --  able to toe walk     Palpation   Palpation comment tenderness at right scapula, distal medial border area     Ambulation/Gait   Ambulation/Gait Yes   Ambulation/Gait Assistance 7: Independent   Assistive device None   Gait Comments slight Trendelenberg (Rt?) or unevenness with walking            Objective measurements completed on examination: See above findings.                          [facility] Goals - 06/03/17 1801      CC Long Term Goal  #1   Title Pt. will be independent with home exercise program for LE and core strengthening   Time 4   Period  Weeks   Status New     CC Long Term Goal  #2   Title Pt. will report at least 60% improvement in pain at right periscapular  area   Time 4   Period Weeks   Status New     CC Long Term Goal  #3   Title Pt. will be able to stand without pushing off with UEs from 17 inch high mat or chair.   Time 4   Period Weeks     CC Long Term Goal  #4   Title Pt. will report being able to climb the stairs at home using railing but not needing to lean on the wall as well   Time 4   Period Weeks   Status New             Plan - 06/03/17 1750    Clinical Impression Statement This is a pleasant woman with diagnosis of lymphoma who is 50 days out from stem cell bone marrow transplant.  She reports significant right upper back (distal medical scapula) pain and lower back pain; she also has decreased strength in her legs which affects her ability to stand from sitting, to climb stairs, and to have a normal gait.   History and Personal Factors relevant to plan of care: Immune system still recovering from bone marrow transplant   Clinical Presentation Evolving   Clinical Presentation due to: recent completion of lymphoma treatment including chemotherapy   Clinical Decision Making Moderate   Rehab Potential Excellent   PT Frequency 2x / week   PT Duration 4 weeks   PT Treatment/Interventions ADLs/Self Care Home Management;Gait training;Stair training;Functional mobility training;Therapeutic exercise;Patient/family education;Manual techniques;Passive range of motion;Dry needling   PT Next Visit Plan Plan is to see her for four weeks, then decide whether she can manage independently with HEP or needs to continue therapy.  Begin with soft tissue work/trigger point release to right distal medial scapula border area pain and on strengthening for LEs.  Include Nustep, bike, treadmill, or elliptical later; also core strengthening.  Consider UE strengthening as well.    Consulted and Agree with Plan of Care  Patient      Patient will benefit from skilled therapeutic intervention in order to improve the following deficits and impairments:  Abnormal gait, Decreased strength, Pain  Visit Diagnosis: Bilateral low back pain without sciatica, unspecified chronicity - Plan: PT plan of care cert/re-cert  Other symptoms and signs involving the musculoskeletal system - Plan: PT plan of care cert/re-cert  Muscle weakness (generalized) - Plan: PT plan of care cert/re-cert  Other abnormalities of gait and mobility - Plan: PT plan of care cert/re-cert     Problem List Patient Active Problem List   Diagnosis Date Noted  . Symptomatic anemia   . Thrombocytopenia ([place])   . Chest pain 01/19/2017  . Dysphagia 01/19/2017  . Pancytopenia ([place]) 01/19/2017  . Hyperglycemia 01/19/2017  . Neutropenic fever ([place]) 01/18/2017  . Peripheral T cell lymphoma of lymph nodes of multiple sites ([place]) 10/28/2016  . Herpes zoster without complication 19/91/4445  . Family history of heart disease in female family member before age 16 07/06/2016  . Arthritis 09/06/2014  . Obesity (BMI 30-39.9) 09/06/2014  . Extrinsic asthma 08/20/2011  . Allergic rhinitis 08/20/2011  . Sleep apnea 08/20/2011  . PVC's (premature ventricular contractions) 08/20/2011  . GERD (gastroesophageal reflux disease) 08/20/2011    SALISBURY,DONNA 06/03/2017, 6:04 PM  [place], [place], [address] Phone: [phone]   Fax:  [phone]  Name: Denise [name] MRN: [id number] Date of Birth: [birth year]  Serafina Royals, PT 06/03/17 6:04 PM

## 2017-06-07 ENCOUNTER — Ambulatory Visit: Payer: BC Managed Care – PPO | Admitting: Physical Therapy

## 2017-06-07 ENCOUNTER — Encounter: Payer: Self-pay | Admitting: Physical Therapy

## 2017-06-07 DIAGNOSIS — R29898 Other symptoms and signs involving the musculoskeletal system: Secondary | ICD-10-CM

## 2017-06-07 DIAGNOSIS — M545 Low back pain: Secondary | ICD-10-CM | POA: Diagnosis not present

## 2017-06-07 NOTE — Therapy (Signed)
[place], [place], [address] Phone: [phone]   Fax:  [phone]  Physical Therapy Treatment  Patient Details  Name: Denise [name] MRN: [id number] Date of Birth: 21-Nov-1955 Referring Provider: Dr. Sullivan [name]  Encounter Date: 06/07/2017      PT End of Session - 06/07/17 1519    Visit Number 2   Number of Visits 9   Date for PT Re-Evaluation 07/16/17   PT Start Time 8250   PT Stop Time 1519   PT Time Calculation (min) 40 min   Activity Tolerance Patient tolerated treatment well   Behavior During Therapy [facility] for tasks assessed/performed      Past Medical History:  Diagnosis Date  . Allergy   . Asthma   . Bursitis of left hip   . Fibroids    TAH in 1999  . GERD (gastroesophageal reflux disease)   . Lymphoma ([place])    T cell lymphoma  . PVC's (premature ventricular contractions)   . Sleep apnea    does not use CPAP    Past Surgical History:  Procedure Laterality Date  . CARDIAC CATH  05/2007   Denise [name]  . HAMMER TOE SURGERY Bilateral 2016  . IR GENERIC HISTORICAL  11/05/2016   IR US GUIDE VASC ACCESS RIGHT 11/05/2016 Denise [name] [facility]  . IR GENERIC HISTORICAL  11/05/2016   IR FLUORO GUIDE PORT INSERTION RIGHT 11/05/2016 Denise [name] [facility]  . TOTAL ABDOMINAL HYSTERECTOMY  05/15/1998   secondary to fibroids  . WISDOM TOOTH EXTRACTION  mid teens    There were no vitals filed for this visit.      Subjective Assessment - 06/07/17 1441    Subjective I am pretty good today. I am not 100% yet. This is my first time back since my evaluation.    Pertinent History Diagnosed with lymphoma 09/23/16. Had CT scan and met with oncologist within two days, then [place] and chemotherapy, 6 rounds at [facility]. PET scan was clear and she went for stem cell bone marrow transplant with Denise [name] at [facility].  I'm 50 days out from that and my immune system's kind of a baby.   HCTZ pill is for bloating, not high blood pressure; atenolol for irregular heartbeat since 2008. No back surgeries or joint replacements; had hysterectomy when she was 39; had two hammer toes fixed two years ago.   Patient Stated Goals get stronger, move better, not be uncomfortable   Currently in Pain? Yes   Pain Score 5    Pain Location Scapula   Pain Orientation Right;Lower;Medial   Pain Descriptors / Indicators Sharp   Multiple Pain Sites Yes   Pain Score 0                         [facility] Adult PT Treatment/Exercise - 06/07/17 0001      Manual Therapy   Manual Therapy Soft tissue mobilization   Soft tissue mobilization to right scapular area: paraspinals, rhomboids, teres major, upper traps, pt had knots and increased tightness at beginning of session that went away with massage  pt started with 5/10 pain and after had 0/10 pain                        [facility] Term Clinic Goals - 06/03/17 1801      CC Long Term Goal  #1   Title Pt. will  be independent with home exercise program for LE and core strengthening   Time 4   Period Weeks   Status New     CC Long Term Goal  #2   Title Pt. will report at least 60% improvement in pain at right periscapular area   Time 4   Period Weeks   Status New     CC Long Term Goal  #3   Title Pt. will be able to stand without pushing off with UEs from 17 inch high mat or chair.   Time 4   Period Weeks     CC Long Term Goal  #4   Title Pt. will report being able to climb the stairs at home using railing but not needing to lean on the wall as well   Time 4   Period Weeks   Status New            Plan - 06/07/17 1520    Clinical Impression Statement Since pt has had increased right pain in her scapula decided to focus on this today. She reports difficulty laying on her left side due to right upper back pain. Performed soft tissue mobilization to this area today. Increased tightness was noted at beginning of  session especially around medial scapula. Following soft tissue mobilization this area relaxed and no knot was palpated. At end of session pt had 0/10 pain in her upper back.    Rehab Potential Excellent   PT Frequency 2x / week   PT Duration 4 weeks   PT Treatment/Interventions ADLs/Self Care Home Management;Gait training;Stair training;Functional mobility training;Therapeutic exercise;Patient/family education;Manual techniques;Passive range of motion;Dry needling   PT Next Visit Plan Plan is to see her for four weeks, then decide whether she can manage independently with HEP or needs to continue therapy.  Begin with soft tissue work/trigger point release to right distal medial scapula border area pain and on strengthening for LEs.  Include Nustep, bike, treadmill, or elliptical later; also core strengthening.  Consider UE strengthening as well.    Consulted and Agree with Plan of Care Patient      Patient will benefit from skilled therapeutic intervention in order to improve the following deficits and impairments:  Abnormal gait, Decreased strength, Pain  Visit Diagnosis: Other symptoms and signs involving the musculoskeletal system     Problem List Patient Active Problem List   Diagnosis Date Noted  . Symptomatic anemia   . Thrombocytopenia ([place])   . Chest pain 01/19/2017  . Dysphagia 01/19/2017  . Pancytopenia ([place]) 01/19/2017  . Hyperglycemia 01/19/2017  . Neutropenic fever ([place]) 01/18/2017  . 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 Herpes zoster without complication 52/84/1324  . Family history of heart disease in female family member before age 68 07/06/2016  . Arthritis 09/06/2014  . Obesity (BMI 30-39.9) 09/06/2014  . Extrinsic asthma 08/20/2011  . Allergic rhinitis 08/20/2011  . Sleep apnea 08/20/2011  . PVC's (premature ventricular contractions) 08/20/2011  . GERD (gastroesophageal reflux disease) 08/20/2011    Denise [name]  Denise [name] 06/07/2017, 3:23 PM  [place], [place], [address] Phone: [phone]   Fax:  [phone]  Name: Denise [name] MRN: [id number] Date of Birth: March 22, 1956  Denise [name] [place], PT 06/07/17 3:23 PM

## 2017-06-14 ENCOUNTER — Ambulatory Visit: Payer: BC Managed Care – PPO | Attending: Hematology | Admitting: Physical Therapy

## 2017-06-14 VITALS — HR 95

## 2017-06-14 DIAGNOSIS — R29898 Other symptoms and signs involving the musculoskeletal system: Secondary | ICD-10-CM | POA: Insufficient documentation

## 2017-06-14 DIAGNOSIS — R2689 Other abnormalities of gait and mobility: Secondary | ICD-10-CM | POA: Insufficient documentation

## 2017-06-14 DIAGNOSIS — M6281 Muscle weakness (generalized): Secondary | ICD-10-CM | POA: Insufficient documentation

## 2017-06-14 DIAGNOSIS — M545 Low back pain, unspecified: Secondary | ICD-10-CM

## 2017-06-14 NOTE — Patient Instructions (Signed)
Do these exercises once or twice a day:  1) Stand with your back against the wall, feet forward a foot or so and feet about shoulder width apart.  Squat partway down, without going far enough to cause knee pain.  Hold for 10 counts and do 10 repetitions.  Do 1-3 sets of 10.  2) Stand facing a countertop and with your hands on the edge of the countertop.  Have your feet back a ways so that you are leaning toward the counter with body straight.  Do pushups like that, keeping your trunk and hips straight, 10 reps.

## 2017-06-14 NOTE — Therapy (Signed)
[place], [place], [address] Phone: [phone]   Fax:  [phone]  Physical Therapy Treatment  Patient Details  Name: Denise [name] MRN: [id number] Date of Birth: [birth year] Referring Provider: Dr. Sullivan Lone   Encounter Date: 06/14/2017  PT End of Session - 06/14/17 2014    Visit Number  3    Number of Visits  9    Date for PT Re-Evaluation  07/16/17       Past Medical History:  Diagnosis Date  . Allergy   . Asthma   . Bursitis of left hip   . Fibroids    TAH in 1999  . GERD (gastroesophageal reflux disease)   . Lymphoma ([place])    T cell lymphoma  . PVC's (premature ventricular contractions)   . Sleep apnea    does not use CPAP    Past Surgical History:  Procedure Laterality Date  . CARDIAC CATH  05/2007   J. BERRY  . HAMMER TOE SURGERY Bilateral 2016  . IR GENERIC HISTORICAL  11/05/2016   IR US GUIDE VASC ACCESS RIGHT 11/05/2016 [name], MD [facility]  . IR GENERIC HISTORICAL  11/05/2016   IR FLUORO GUIDE PORT INSERTION RIGHT 11/05/2016 [name], MD [facility]  . TOTAL ABDOMINAL HYSTERECTOMY  05/15/1998   secondary to fibroids  . WISDOM TOOTH EXTRACTION  mid teens    Vitals:   06/14/17 1501  Pulse: 95  SpO2: (!) 84%    Subjective Assessment - 06/14/17 1439    Subjective  Blaire helped my back--she really did.  It doesn't hurt today, and it didn't yesterday either. I have a concern: Initially that day she said that she was going to put me on the treadmill and the elliptical.  What I need you to understand is that if I walk to the mailbox, I'm done in." I can't do the elliptical. I don't know why my lower back is better, but I'll take it.    Pertinent History  Diagnosed with lymphoma 09/23/16. Had CT scan and met with oncologist within two days, then [place] and chemotherapy, 6 rounds at [facility]. PET scan was clear and she went for stem cell bone marrow transplant  with Dr. Dellis Filbert at [facility].  I'm 50 days out from that and my immune system's kind of a baby.  HCTZ pill is for bloating, not high blood pressure; atenolol for irregular heartbeat since 2008. No back surgeries or joint replacements; had hysterectomy when she was 39; had two hammer toes fixed two years ago.    Currently in Pain?  No/denies                      [facility] Adult PT Treatment/Exercise - 06/14/17 0001      Self-Care   Self-Care  Other Self-Care Comments    Other Self-Care Comments   [facility] [employer] [facility] [employer] [facility] and how to participate      Knee/Hip Exercises: Standing   Forward Lunges Limitations  forward lunge walking 24 feet x 2; needed 1 hand support on wall for balance and contact guard of therapist SpO2 dropped to 85; came up to 96 in a couple minutes   SpO2 dropped to 85; came up to 96 in a couple minutes   Lateral Step Up  Right;Left;10 reps;Hand Hold: 1;Step Height: 6"    Lateral Step Up Limitations  2 hands with left leg going up hands on back on  bike seat   hands on back on bike seat   Forward Step Up  Right;Left;Hand Hold: 1;Step Height: 6";10 reps    Forward Step Up Limitations  -- SpO2 was low (84) after this--see vitals--but came up   SpO2 was low (84) after this--see vitals--but came up   Wall Squat  10 reps then 10 second holds x 10 more   then 10 second holds x 10 more   Wall Squat Limitations  limited squat due to knee pain    Other Standing Knee Exercises  yellow Theraband around ankles, walked 24 feet to left and to right with band for resistance SpO2 dropped to 84 again, came up to mid-90s in 2 mins.   SpO2 dropped to 84 again, came up to mid-90s in 2 mins.     Knee/Hip Exercises: Seated   Ball Squeeze  count of 3 10 reps      Shoulder Exercises: Standing   Other Standing Exercises  countertop pushupx x 10             PT Education - 06/14/17 2014    Education provided  Yes    Education Details  wall slides, countertop  pushups; also about [employer] at the [employer]) Educated  Patient    Methods  Explanation;Verbal cues;Handout;Demonstration    Comprehension  Verbalized understanding;Returned demonstration             [facility] Goals - 06/03/17 1801      CC Long Term Goal  #1   Title  Pt. will be independent with home exercise program for LE and core strengthening    Time  4    Period  Weeks    Status  New      CC Long Term Goal  #2   Title  Pt. will report at least 60% improvement in pain at right periscapular area    Time  4    Period  Weeks    Status  New      CC Long Term Goal  #3   Title  Pt. will be able to stand without pushing off with UEs from 17 inch high mat or chair.    Time  4    Period  Weeks      CC Long Term Goal  #4   Title  Pt. will report being able to climb the stairs at home using railing but not needing to lean on the wall as well    Time  4    Period  Weeks    Status  New         Plan - 06/14/17 2015    Clinical Impression Statement  Pt. came in reporting no pain today; she has felt much better since last session focused on that.  She stated clearly that she does not want to do the elliptical. She did well with a focus on strengthening exercise today.  She was challenged by exercises and reported only mild dyspnea, though SpO2 dropped into the mid-80s after several exercises.  It came back up into the 90s with her resting in sitting and taking deep breaths for just a couple of minutes.  She did not feel overly tired at the end of today's session, but had felt challenged.    Rehab Potential  Excellent    PT Frequency  2x / week    PT Duration  4 weeks    PT Treatment/Interventions  ADLs/Self Care Home  Management;Gait training;Stair training;Functional mobility training;Therapeutic exercise;Patient/family education;Manual techniques;Passive range of motion;Dry needling    PT Next Visit Plan  If having pain, do more soft tissue work, which  resolved it at last session.  Otherwise, focus on strengthening and endurance exercise, with monitoring of O2 saturation, as this dipped into the 80s today.  Check HEP given today and add to it.    PT Home Exercise Plan  wall slides and countertop pushups    Consulted and Agree with Plan of Care  Patient       Patient will benefit from skilled therapeutic intervention in order to improve the following deficits and impairments:  Abnormal gait, Decreased strength, Pain  Visit Diagnosis: Other symptoms and signs involving the musculoskeletal system  Bilateral low back pain without sciatica, unspecified chronicity  Muscle weakness (generalized)  Other abnormalities of gait and mobility     Problem List Patient Active Problem List   Diagnosis Date Noted  . Symptomatic anemia   . Thrombocytopenia ([place])   . Chest pain 01/19/2017  . Dysphagia 01/19/2017  . Pancytopenia ([place]) 01/19/2017  . Hyperglycemia 01/19/2017  . Neutropenic fever ([place]) 01/18/2017  . 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 Herpes zoster without complication 85/50/1586  . Family history of heart disease in female family member before age 54 07/06/2016  . Arthritis 09/06/2014  . Obesity (BMI 30-39.9) 09/06/2014  . Extrinsic asthma 08/20/2011  . Allergic rhinitis 08/20/2011  . Sleep apnea 08/20/2011  . PVC's (premature ventricular contractions) 08/20/2011  . GERD (gastroesophageal reflux disease) 08/20/2011    SALISBURY,DONNA 06/14/2017, 8:24 PM  [place], [place], [address] Phone: [phone]   Fax:  [phone]  Name: Denise [name] MRN: [id number] Date of Birth: 09-09-1955  Serafina Royals, PT 06/14/17 8:24 PM

## 2017-06-16 ENCOUNTER — Ambulatory Visit: Payer: BC Managed Care – PPO | Admitting: Physical Therapy

## 2017-06-16 VITALS — HR 88

## 2017-06-16 DIAGNOSIS — R29898 Other symptoms and signs involving the musculoskeletal system: Secondary | ICD-10-CM | POA: Diagnosis not present

## 2017-06-16 DIAGNOSIS — M545 Low back pain, unspecified: Secondary | ICD-10-CM

## 2017-06-16 DIAGNOSIS — R2689 Other abnormalities of gait and mobility: Secondary | ICD-10-CM

## 2017-06-16 DIAGNOSIS — M6281 Muscle weakness (generalized): Secondary | ICD-10-CM

## 2017-06-16 NOTE — Therapy (Signed)
[place], [place], [address] Phone: [phone]   Fax:  [phone]  Physical Therapy Treatment  Patient Details  Name: Denise [name] MRN: [id number] Date of Birth: 20-Feb-1956 Referring Provider: Dr. Sullivan Lone   Encounter Date: 06/16/2017  PT End of Session - 06/16/17 1526    Visit Number  4    Number of Visits  9    Date for PT Re-Evaluation  07/16/17    PT Start Time  1430    PT Stop Time  1521    PT Time Calculation (min)  51 min    Activity Tolerance  Patient tolerated treatment well    Behavior During Therapy  [facility] for tasks assessed/performed       Past Medical History:  Diagnosis Date  . Allergy   . Asthma   . Bursitis of left hip   . Fibroids    TAH in 1999  . GERD (gastroesophageal reflux disease)   . Lymphoma ([place])    T cell lymphoma  . PVC's (premature ventricular contractions)   . Sleep apnea    does not use CPAP    Past Surgical History:  Procedure Laterality Date  . CARDIAC CATH  05/2007   J. BERRY  . HAMMER TOE SURGERY Bilateral 2016  . IR GENERIC HISTORICAL  11/05/2016   IR US GUIDE VASC ACCESS RIGHT 11/05/2016 [name], MD [facility]  . IR GENERIC HISTORICAL  11/05/2016   IR FLUORO GUIDE PORT INSERTION RIGHT 11/05/2016 [name], MD [facility]  . TOTAL ABDOMINAL HYSTERECTOMY  05/15/1998   secondary to fibroids  . WISDOM TOOTH EXTRACTION  mid teens    Vitals:   06/16/17 1437  Pulse: 88  SpO2: 96%    Subjective Assessment - 06/16/17 1432    Subjective  Got sore after last visit in knees and arms, but it has subsided.  It just felt like new exercise soreness. Lower and upper back are still good. Lunging down the hall scared me.    Pertinent History  Diagnosed with lymphoma 09/23/16. Had CT scan and met with oncologist within two days, then [place] and chemotherapy, 6 rounds at [facility]. PET scan was clear and she went for stem cell bone marrow  transplant with Dr. Dellis Filbert at [facility].  I'm 50 days out from that and my immune system's kind of a baby.  HCTZ pill is for bloating, not high blood pressure; atenolol for irregular heartbeat since 2008. No back surgeries or joint replacements; had hysterectomy when she was 39; had two hammer toes fixed two years ago.    Currently in Pain?  No/denies                      [facility] Adult PT Treatment/Exercise - 06/16/17 0001      Knee/Hip Exercises: Aerobic   Nustep  @ L2, arms at #10, x 10 mins total.  SpO2 checked at 3 1/2 minutes and it was 91 so had patient slow her pace from 67 steps per minute to ~55; SpO2 then checked every 2 minutes and stayed at 91 with HR at 88 for the rest of the time SpO2 increased quickly after patient stopped   SpO2 increased quickly after patient stopped     Knee/Hip Exercises: Standing   Lateral Step Up  Right;Left;10 reps;Step Height: 6";Hand Hold: 2 SpO2 dropped to 83 after this; moderate SOB perceived   SpO2 dropped to 83 after this; moderate SOB  perceived   Forward Step Up  Right;Left;Hand Hold: 1;Step Height: 6";10 reps SpO2 83, HR 100 after this; sat and rested   SpO2 83, HR 100 after this; sat and rested   Other Standing Knee Exercises  yellow Theraband around ankles, walked 24 feet to left and to right with band for resistance SpO2 dropped to 84, HR 101; SpO2 came up to 94 shortly   SpO2 dropped to 84, HR 101; SpO2 came up to 94 shortly     Shoulder Exercises: Seated   Row  Both;Strengthening;20 reps;Theraband    Theraband Level (Shoulder Row)  Level 2 (Red)    Other Seated Exercises  pt. pushing physioball forward against resistance given by therapist x 20      Shoulder Exercises: Standing   Other Standing Exercises  3-way arm raises with 1 lb. weight in each hand x 10 each way; SpO2 87, HR 94 after this   SpO2 87, HR 94 after this                    [place] [facility] Goals - 06/03/17 1801      CC Long Term Goal  #1    Title  Pt. will be independent with home exercise program for LE and core strengthening    Time  4    Period  Weeks    Status  New      CC Long Term Goal  #2   Title  Pt. will report at least 60% improvement in pain at right periscapular area    Time  4    Period  Weeks    Status  New      CC Long Term Goal  #3   Title  Pt. will be able to stand without pushing off with UEs from 17 inch high mat or chair.    Time  4    Period  Weeks      CC Long Term Goal  #4   Title  Pt. will report being able to climb the stairs at home using railing but not needing to lean on the wall as well    Time  4    Period  Weeks    Status  New         Plan - 06/16/17 1526    Clinical Impression Statement  Pt. again worked hard in therapy but needed frequent rests because of mild-moderate shortness of breath and SpO2 dropping to mid-80s.  With rest, her saturation comes back up quickly. A bit more focus on UE strengthening today than last time.    Rehab Potential  Excellent    PT Frequency  2x / week    PT Duration  4 weeks    PT Treatment/Interventions  ADLs/Self Care Home Management;Gait training;Stair training;Functional mobility training;Therapeutic exercise;Patient/family education;Manual techniques;Passive range of motion;Dry needling    PT Next Visit Plan  Add to HEP next visit.  Check goals.  Continue UE and LE strengthening and balance.    PT Home Exercise Plan  wall slides and countertop pushups    Consulted and Agree with Plan of Care  Patient       Patient will benefit from skilled therapeutic intervention in order to improve the following deficits and impairments:  Abnormal gait, Decreased strength, Pain  Visit Diagnosis: Other symptoms and signs involving the musculoskeletal system  Bilateral low back pain without sciatica, unspecified chronicity  Muscle weakness (generalized)  Other abnormalities of gait and mobility  Problem List Patient Active Problem List    Diagnosis Date Noted  . Symptomatic anemia   . Thrombocytopenia ([place])   . Chest pain 01/19/2017  . Dysphagia 01/19/2017  . Pancytopenia ([place]) 01/19/2017  . Hyperglycemia 01/19/2017  . Neutropenic fever ([place]) 01/18/2017  . 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 Herpes zoster without complication 09/32/6712  . Family history of heart disease in female family member before age 69 07/06/2016  . Arthritis 09/06/2014  . Obesity (BMI 30-39.9) 09/06/2014  . Extrinsic asthma 08/20/2011  . Allergic rhinitis 08/20/2011  . Sleep apnea 08/20/2011  . PVC's (premature ventricular contractions) 08/20/2011  . GERD (gastroesophageal reflux disease) 08/20/2011    SALISBURY,DONNA 06/16/2017, 3:29 PM  [place], [place], [address] Phone: [phone]   Fax:  [phone]  Name: Denise [name] MRN: [id number] Date of Birth: 1955-08-29  Serafina Royals, PT 06/16/17 3:29 PM

## 2017-06-21 ENCOUNTER — Ambulatory Visit: Payer: BC Managed Care – PPO | Admitting: Physical Therapy

## 2017-06-21 DIAGNOSIS — R29898 Other symptoms and signs involving the musculoskeletal system: Secondary | ICD-10-CM | POA: Diagnosis not present

## 2017-06-21 DIAGNOSIS — M6281 Muscle weakness (generalized): Secondary | ICD-10-CM

## 2017-06-21 DIAGNOSIS — M545 Low back pain, unspecified: Secondary | ICD-10-CM

## 2017-06-21 NOTE — Patient Instructions (Signed)
NECK TENSION: Assisted Stretch    Reach right arm around head and hold slightly above ear. Gently bring right ear toward right shoulder. Hold position for _30__secondss. Repeat with other arm. Repeat _1-2__ times, alternating arms. Do _2__ times per day.  Copyright  VHI. All rights reserved.    Standing Shoulder Motion    With hands shoulder-width apart on table top, raise shoulders (arching back) and hold for 30 seconds, then lower shoulders. Repeat __1-2__ times. Do _2___ sessions per day.  http://cc.exer.us/59   Copyright  VHI. All rights reserved.

## 2017-06-21 NOTE — Therapy (Signed)
[place], [place], [address] Phone: [phone]   Fax:  [phone]  Physical Therapy Treatment  Patient Details  Name: Denise [name] MRN: [id number] Date of Birth: September 25, 1955 Referring Provider: Dr. Sullivan Lone   Encounter Date: 06/21/2017  PT End of Session - 06/21/17 2040    Visit Number  5    Number of Visits  9    Date for PT Re-Evaluation  07/16/17    PT Start Time  1302    PT Stop Time  8366    PT Time Calculation (min)  45 min    Activity Tolerance  Patient tolerated treatment well    Behavior During Therapy  [facility] for tasks assessed/performed       Past Medical History:  Diagnosis Date  . Allergy   . Asthma   . Bursitis of left hip   . Fibroids    TAH in 1999  . GERD (gastroesophageal reflux disease)   . Lymphoma ([place])    T cell lymphoma  . PVC's (premature ventricular contractions)   . Sleep apnea    does not use CPAP    Past Surgical History:  Procedure Laterality Date  . CARDIAC CATH  05/2007   J. BERRY  . HAMMER TOE SURGERY Bilateral 2016  . IR GENERIC HISTORICAL  11/05/2016   IR US GUIDE VASC ACCESS RIGHT 11/05/2016 [name], MD [facility]  . IR GENERIC HISTORICAL  11/05/2016   IR FLUORO GUIDE PORT INSERTION RIGHT 11/05/2016 [name], MD [facility]  . TOTAL ABDOMINAL HYSTERECTOMY  05/15/1998   secondary to fibroids  . WISDOM TOOTH EXTRACTION  mid teens    There were no vitals filed for this visit.  Subjective Assessment - 06/21/17 1303    Subjective  "My breathing's bad--I don't know if it's what we've been doing here or my allergies." Got sore from last time.  I haven't done my homework--I just haven't.    Pertinent History  Diagnosed with lymphoma 09/23/16. Had CT scan and met with oncologist within two days, then [place] and chemotherapy, 6 rounds at [facility]. 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 Dellis Filbert at  [facility].  I'm 50 days out from that and my immune system's kind of a baby.  HCTZ pill is for bloating, not high blood pressure; atenolol for irregular heartbeat since 2008. No back surgeries or joint replacements; had hysterectomy when she was 39; had two hammer toes fixed two years ago.    Currently in Pain?  Yes    Pain Score  3     Pain Location  Scapula    Pain Orientation  Right    Pain Descriptors / Indicators  Other (Comment) with breathing in    Aggravating Factors   taking a breath in    Pain Relieving Factors  lying down; manual therapy work                      [facility] Adult PT Treatment/Exercise - 06/21/17 0001      Knee/Hip Exercises: Standing   Heel Raises  Both 30 reps; SpO2 92% after wall slides and heel raises    Wall Squat  1 set;10 reps;10 seconds    Wall Squat Limitations  limited squat due to knee pain      Shoulder Exercises: Seated   Other Seated Exercises  pt. pushing physioball forward against resistance given by therapist  x 20      Shoulder Exercises: Standing   Extension  Strengthening;Both;20 reps;Theraband    Theraband Level (Shoulder Extension)  Level 2 (Red)    Other Standing Exercises  3-way arm raises with 1 lb. weight in each hand x 10 each way; SpO2 87, HR 94 after this    Other Standing Exercises  wall pushups x 10 SpO2 at 89% after these two      Shoulder Exercises: Stretch   Other Shoulder Stretches  rhomboid stretches with hands clasped,pushing arms forward and moving hands to different heights; lean forward in sitting and have hands under feet, then arch back; right arm across chest in horizontal adduction, left arm pulling arm in to chest;  right upper trap stretch with arm stabilized at chair seat on right and using left hand to pull head left      Manual Therapy   Soft tissue mobilization  in sitting: to right scapular area: paraspinals, rhomboids, teres major, upper traps                     Long Term Clinic  Goals - 06/21/17 1306      CC Long Term Goal  #1   Title  Pt. will be independent with home exercise program for LE and core strengthening    Status  On-going      CC Long Term Goal  #2   Title  Pt. will report at least 60% improvement in pain at right periscapular area    Status  Achieved      CC Long Term Goal  #3   Title  Pt. will be able to stand without pushing off with UEs from 17 inch high mat or chair.    Status  On-going      CC Long Term Goal  #4   Title  Pt. will report being able to climb the stairs at home using railing but not needing to lean on the wall as well    Status  On-going         Plan - 06/21/17 2041    Clinical Impression Statement  Pt. came in and was mildly short of breath from walking in to clinic and down our hallway.  She has not been doing home exercise program, so no additional exercises were added to it yet.  She had some return of back pain today, so time was spent doing soft tissue work for that, and she did report benefit from that treatment.    Rehab Potential  Excellent    PT Frequency  2x / week    PT Duration  4 weeks    PT Treatment/Interventions  ADLs/Self Care Home Management;Gait training;Stair training;Functional mobility training;Therapeutic exercise;Patient/family education;Manual techniques;Passive range of motion;Dry needling    PT Next Visit Plan  Add to HEP next visit. Continue UE and LE strengthening and balance.    PT Home Exercise Plan  wall slides and countertop pushups    Consulted and Agree with Plan of Care  Patient       Patient will benefit from skilled therapeutic intervention in order to improve the following deficits and impairments:     Visit Diagnosis: Other symptoms and signs involving the musculoskeletal system  Bilateral low back pain without sciatica, unspecified chronicity  Muscle weakness (generalized)     Problem List Patient Active Problem List   Diagnosis Date Noted  . Symptomatic anemia   .  Thrombocytopenia ([place])   . Chest pain  01/19/2017  . Dysphagia 01/19/2017  . Pancytopenia ([place]) 01/19/2017  . Hyperglycemia 01/19/2017  . Neutropenic fever ([place]) 01/18/2017  . 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 Herpes zoster without complication 14/05/3012  . Family history of heart disease in female family member before age 27 07/06/2016  . Arthritis 09/06/2014  . Obesity (BMI 30-39.9) 09/06/2014  . Extrinsic asthma 08/20/2011  . Allergic rhinitis 08/20/2011  . Sleep apnea 08/20/2011  . PVC's (premature ventricular contractions) 08/20/2011  . GERD (gastroesophageal reflux disease) 08/20/2011    Ifeanyi Mickelson 06/21/2017, 8:46 PM  [place], [place], [address] Phone: [phone]   Fax:  [phone]  Name: ANUREET BRUINGTON MRN: [id number] Date of Birth: 07/20/1956  Serafina Royals, PT 06/21/17 8:46 PM

## 2017-06-23 ENCOUNTER — Ambulatory Visit: Payer: BC Managed Care – PPO | Admitting: Physical Therapy

## 2017-06-23 DIAGNOSIS — R29898 Other symptoms and signs involving the musculoskeletal system: Secondary | ICD-10-CM

## 2017-06-23 DIAGNOSIS — M6281 Muscle weakness (generalized): Secondary | ICD-10-CM

## 2017-06-23 NOTE — Therapy (Signed)
[place], [place], [address] Phone: [phone]   Fax:  [phone]  Physical Therapy Treatment  Patient Details  Name: Denise [name] MRN: [id number] Date of Birth: Nov 25, 1955 Referring Provider: Dr. Sullivan Lone   Encounter Date: 06/23/2017  PT End of Session - 06/23/17 1717    Visit Number  6    Number of Visits  9    Date for PT Re-Evaluation  07/16/17    PT Start Time  3149    PT Stop Time  1518    PT Time Calculation (min)  42 min    Activity Tolerance  Patient tolerated treatment well    Behavior During Therapy  [facility] for tasks assessed/performed       Past Medical History:  Diagnosis Date  . Allergy   . Asthma   . Bursitis of left hip   . Fibroids    TAH in 1999  . GERD (gastroesophageal reflux disease)   . Lymphoma ([place])    T cell lymphoma  . PVC's (premature ventricular contractions)   . Sleep apnea    does not use CPAP    Past Surgical History:  Procedure Laterality Date  . CARDIAC CATH  05/2007   J. BERRY  . HAMMER TOE SURGERY Bilateral 2016  . IR GENERIC HISTORICAL  11/05/2016   IR US GUIDE VASC ACCESS RIGHT 11/05/2016 [name], MD [facility]  . IR GENERIC HISTORICAL  11/05/2016   IR FLUORO GUIDE PORT INSERTION RIGHT 11/05/2016 [name], MD [facility]  . TOTAL ABDOMINAL HYSTERECTOMY  05/15/1998   secondary to fibroids  . WISDOM TOOTH EXTRACTION  mid teens    There were no vitals filed for this visit.  Subjective Assessment - 06/23/17 1437    Subjective  "[birth year] my [birth year]!"     Pertinent History  Diagnosed with lymphoma 09/23/16. Had CT scan and met with oncologist within two days, then [place] and chemotherapy, 6 rounds at [facility]. 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 Dellis Filbert at [facility].  I'm 62 days out from that and my immune system's kind of a baby.  HCTZ pill is for bloating, not high blood pressure; atenolol for  irregular heartbeat since 2008. No back surgeries or joint replacements; had hysterectomy when she was 61; had two hammer toes fixed two years ago.                      [place] Adult PT Treatment/Exercise - 06/23/17 0001      Knee/Hip Exercises: Aerobic   Nustep  _0 , arms at 10, with arms and legs x10  mins.; SpO2 stayed at 96, checked at 5 and 10 mins., with pace at 60-70 spm      Knee/Hip Exercises: Standing   Hip Abduction  Stengthening;Right;Left;Knee straight yellow Theraband, 10x 2 left, 10 right (more difficulty Rt.)    Lateral Step Up  Right;Left;10 reps;Hand Hold: 2;Step Height: 6" +Airex foam pad on top of step; SpO2 85% after, to 95% quick    Forward Step Up  Right;Left;10 reps;Hand Hold: 1;Step Height: 6" + Airex foam pad on top of step; SpO2 85% after, 95% quickly      Shoulder Exercises: Standing   Extension  Strengthening;Both;20 reps;Theraband    Theraband Level (Shoulder Extension)  Level 2 (Red)    Other Standing Exercises  3-way arm raises with 1 lb. weight in each hand x 10 each  way;    Other Standing Exercises  bicep curl to overhead press with 2 lb. weight in each hand x 10; wall pushups x 10 SpO2 at 93% after arm raises and curls/presses                     Long Term Clinic Goals - 06/21/17 1306      CC Long Term Goal  #1   Title  Pt. will be independent with home exercise program for LE and core strengthening    Status  On-going      CC Long Term Goal  #2   Title  Pt. will report at least 60% improvement in pain at right periscapular area    Status  Achieved      CC Long Term Goal  #3   Title  Pt. will be able to stand without pushing off with UEs from 17 inch high mat or chair.    Status  On-going      CC Long Term Goal  #4   Title  Pt. will report being able to climb the stairs at home using railing but not needing to lean on the wall as well    Status  On-going         Plan - 06/23/17 1718    Clinical Impression  Statement  Pt. seemed better today.  She still needs rests between exercises and gets short of breath, but her SpO2 levels generally stayed higher today and she recovered very quickly at times they did drop down into the 80s. She feels she may have noticed a little increased ease of negotiating stairs at home.    Rehab Potential  Excellent    PT Frequency  2x / week    PT Duration  4 weeks    PT Treatment/Interventions  ADLs/Self Care Home Management;Gait training;Stair training;Functional mobility training;Therapeutic exercise;Patient/family education;Manual techniques;Passive range of motion;Dry needling    PT Next Visit Plan  Add to HEP next visit. Continue UE and LE strengthening and balance.    PT Home Exercise Plan  wall slides and countertop pushups    Consulted and Agree with Plan of Care  Patient       Patient will benefit from skilled therapeutic intervention in order to improve the following deficits and impairments:  Abnormal gait, Decreased strength, Pain  Visit Diagnosis: Other symptoms and signs involving the musculoskeletal system  Muscle weakness (generalized)     Problem List Patient Active Problem List   Diagnosis Date Noted  . Symptomatic anemia   . Thrombocytopenia ([place])   . Chest pain 01/19/2017  . Dysphagia 01/19/2017  . Pancytopenia ([place]) 01/19/2017  . Hyperglycemia 01/19/2017  . Neutropenic fever ([place]) 01/18/2017  . 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 Herpes zoster without complication 58/30/9407  . Family history of heart disease in female family member before age 62 07/06/2016  . Arthritis 09/06/2014  . Obesity (BMI 30-39.9) 09/06/2014  . Extrinsic asthma 08/20/2011  . Allergic rhinitis 08/20/2011  . Sleep apnea 08/20/2011  . PVC's (premature ventricular contractions) 08/20/2011  . GERD (gastroesophageal reflux disease) 08/20/2011    Denise [name] 06/23/2017, 5:20 PM  [place], [place], [address] Phone: [phone]   Fax:  [phone]  Name: Denise [name] MRN: [id number] Date of Birth: [birth year]  Serafina Royals, PT 06/23/17 5:20 PM

## 2017-06-28 ENCOUNTER — Telehealth: Payer: Self-pay | Admitting: Hematology

## 2017-06-28 ENCOUNTER — Encounter: Payer: Self-pay | Admitting: Hematology

## 2017-06-28 ENCOUNTER — Other Ambulatory Visit (HOSPITAL_BASED_OUTPATIENT_CLINIC_OR_DEPARTMENT_OTHER): Payer: BC Managed Care – PPO

## 2017-06-28 ENCOUNTER — Ambulatory Visit (HOSPITAL_BASED_OUTPATIENT_CLINIC_OR_DEPARTMENT_OTHER): Payer: BC Managed Care – PPO | Admitting: Hematology

## 2017-06-28 VITALS — BP 132/76 | HR 73 | Temp 98.1°F | Resp 18 | Ht 64.0 in | Wt 166.2 lb

## 2017-06-28 DIAGNOSIS — C8478 Anaplastic large cell lymphoma, ALK-negative, lymph nodes of multiple sites: Secondary | ICD-10-CM

## 2017-06-28 DIAGNOSIS — Z8572 Personal history of non-Hodgkin lymphomas: Secondary | ICD-10-CM | POA: Diagnosis not present

## 2017-06-28 DIAGNOSIS — Z9481 Bone marrow transplant status: Secondary | ICD-10-CM

## 2017-06-28 DIAGNOSIS — C8448 Peripheral T-cell lymphoma, not classified, lymph nodes of multiple sites: Secondary | ICD-10-CM

## 2017-06-28 LAB — COMPREHENSIVE METABOLIC PANEL
ALT: 8 U/L (ref 0–55)
ANION GAP: 9 meq/L (ref 3–11)
AST: 14 U/L (ref 5–34)
Albumin: 3.3 g/dL — ABNORMAL LOW (ref 3.5–5.0)
Alkaline Phosphatase: 74 U/L (ref 40–150)
BILIRUBIN TOTAL: 0.31 mg/dL (ref 0.20–1.20)
BUN: 8.5 mg/dL (ref 7.0–26.0)
CALCIUM: 9.2 mg/dL (ref 8.4–10.4)
CHLORIDE: 105 meq/L (ref 98–109)
CO2: 25 mEq/L (ref 22–29)
CREATININE: 0.7 mg/dL (ref 0.6–1.1)
EGFR: >60 mL/min/{1.73_m2} (ref >60)
Glucose: 123 mg/dl (ref 70–140)
Potassium: 3.9 mEq/L (ref 3.5–5.1)
Sodium: 139 mEq/L (ref 136–145)
Total Protein: 6.3 g/dL — ABNORMAL LOW (ref 6.4–8.3)

## 2017-06-28 LAB — CBC & DIFF AND RETIC
BASO%: 0.2 % (ref 0.0–2.0)
Basophils Absolute: 0 10*3/uL (ref 0.0–0.1)
EOS ABS: 0.1 10*3/uL (ref 0.0–0.5)
EOS%: 3 % (ref 0.0–7.0)
HCT: 31.2 % — ABNORMAL LOW (ref 34.8–46.6)
HGB: 10.2 g/dL — ABNORMAL LOW (ref 11.6–15.9)
Immature Retic Fract: 9.8 % (ref 1.60–10.00)
LYMPH%: 16.4 % (ref 14.0–49.7)
MCH: 29.2 pg (ref 25.1–34.0)
MCHC: 32.7 g/dL (ref 31.5–36.0)
MCV: 89.4 fL (ref 79.5–101.0)
MONO#: 0.4 10*3/uL (ref 0.1–0.9)
MONO%: 8.5 % (ref 0.0–14.0)
NEUT%: 71.9 % (ref 38.4–76.8)
NEUTROS ABS: 3.1 10*3/uL (ref 1.5–6.5)
Platelets: 129 10*3/uL — ABNORMAL LOW (ref 145–400)
RBC: 3.49 10*6/uL — AB (ref 3.70–5.45)
RDW: 14.2 % (ref 11.2–14.5)
RETIC %: 2.44 % — AB (ref 0.70–2.10)
Retic Ct Abs: 85.16 10*3/uL (ref 33.70–90.70)
WBC: 4.3 10*3/uL (ref 3.9–10.3)
lymph#: 0.7 10*3/uL — ABNORMAL LOW (ref 0.9–3.3)

## 2017-06-28 NOTE — Telephone Encounter (Signed)
Scheduled appt per 11/19 los - Gave patient AVS and calender per los.  

## 2017-06-28 NOTE — Progress Notes (Signed)
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 She is accompanied by her partner. Her hgb is 10.2 today 06/28/2017 and platelets have improved to 129k. She states she is doing well overall. Her hair is starting to grow back and she has started PT which she notes has been helpful. She states she would like to return to work in January 2019 to which I recommended delaying but ultimately if she feels well, she may go back since she will be 4-6 months out of her transplant. [place]  On review of systems, pt reports SOB could be due to allergy and denies mouth sores, diarrhea, changes in BM, dysuria, changes in appetite, fever, chills, night sweats and any acute new symptoms or concerns.    MEDICAL HISTORY:  Past Medical History:  Diagnosis Date  . Allergy   . Asthma   . Bursitis of left hip   . Fibroids    TAH in 1999  . GERD (gastroesophageal reflux disease)   . Lymphoma ([place])    T cell lymphoma  . PVC's (premature ventricular contractions)   . Sleep apnea    does not use CPAP    SURGICAL HISTORY: Past Surgical History:  Procedure Laterality Date  . CARDIAC CATH  05/2007   Denise [name]  . HAMMER TOE SURGERY Bilateral 2016  . IR GENERIC HISTORICAL  11/05/2016   IR US GUIDE VASC ACCESS RIGHT 11/05/2016 Denise Killings, MD [facility]  . IR GENERIC HISTORICAL  11/05/2016   IR FLUORO GUIDE PORT INSERTION RIGHT 11/05/2016 Denise Killings, MD [facility]  . TOTAL ABDOMINAL HYSTERECTOMY  05/15/1998   secondary to fibroids  . WISDOM TOOTH EXTRACTION  mid teens     SOCIAL HISTORY: Social History   Socioeconomic History  . Marital status: Significant Other    Spouse name: Not on file  . Number of children: 0  . Years of education: Not on file  . Highest education level: Not on file  Social Needs  . Financial resource strain: Not on file  . Food insecurity - worry: Not on file  . Food insecurity - inability: Not on file  . Transportation needs - medical: Not on file  . Transportation needs - non-medical: Not on file  Occupational History  . 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[employer]: Denise [name] [facility]  Tobacco Use  . Smoking status: Never Smoker  . Smokeless tobacco: Never Used  Substance and Sexual Activity  . Alcohol use: No    Frequency: Never  . Drug use: No  . Sexual activity: Yes    Partners: Female    Birth control/protection: None  Other Topics Concern  . Not on file  Social History Narrative  . Not on file    FAMILY HISTORY: Family History  Problem Relation Age of Onset  . Cancer Maternal Grandmother        GI cancer in 56's  . Dementia Mother   . Heart disease Father   . Heart failure Father     ALLERGIES:  is allergic to  morphine and related.  MEDICATIONS:  Current Outpatient Medications  Medication Sig Dispense Refill  . acyclovir (ZOVIRAX) 400 MG tablet TAKE 1 TABLET(400 MG) BY MOUTH TWICE DAILY 60 tablet 0  . ADVAIR DISKUS 100-50 MCG/DOSE AEPB Inhale 1 puff into the lungs daily as needed (sob and wheezing).     Denise [name] albuterol (PROVENTIL HFA;VENTOLIN HFA) 108 (90 Base) MCG/ACT inhaler Inhale 2 puffs into the lungs every 6 (six) hours as needed for wheezing or shortness of breath. 1 Inhaler 0  . ALPRAZolam (XANAX) 0.25 MG tablet TAKE 1 TABLET BY MOUTH TWICE A DAY AS NEEDED FOR ANXIETY 50 tablet 0  . aspirin 81 MG chewable tablet Chew 81 mg by mouth daily as needed for moderate pain.     Denise [name] atenolol (TENORMIN) 50 MG tablet TAKE 1 TABLET BY MOUTH TWICE DAILY. 180 tablet 0  . DULoxetine  (CYMBALTA) 30 MG capsule TAKE 1 CAPSULE(30 MG) BY MOUTH DAILY 30 capsule 2  . esomeprazole (NEXIUM) 40 MG capsule Take 1 capsule (40 mg total) by mouth daily before breakfast. (Patient taking differently: Take 40 mg by mouth daily as needed (indigestion). ) 100 capsule 0  . fluconazole (DIFLUCAN) 100 MG tablet Take 1 tablet (100 mg total) by mouth daily. Take 2 tabs on day 1 then 1 tab po daily x 12 days 14 tablet 0  . hydrochlorothiazide (MICROZIDE) 12.5 MG capsule TAKE ONE CAPSULE BY MOUTH EVERY DAY 30 capsule 11  . meloxicam (MOBIC) 15 MG tablet TAKE 1 TABLET BY MOUTH EVERY DAY AS NEEDED FOR MUSCLE SPASMS 30 tablet 1  . triazolam (HALCION) 0.25 MG tablet TAKE 1 TABLET BY MOUTH EVERY NIGHT AT BEDTIME AS NEEDED FOR SLEEP 30 tablet 0  . valACYclovir (VALTREX) 1000 MG tablet Take 1 tablet (1,000 mg total) by mouth 2 (two) times daily. (Patient not taking: Reported on 06/03/2017) 4 tablet 0   No current facility-administered medications for this visit.    Facility-Administered Medications Ordered in Other Visits  Medication Dose Route Frequency Provider Last Rate Last Dose  . sodium chloride flush (NS) 0.9 % injection 10 mL  10 mL Intracatheter PRN Denise Genera, MD   10 mL at 12/01/16 1731    REVIEW OF SYSTEMS:    A 10+ POINT REVIEW OF SYSTEMS WAS OBTAINED including neurology, dermatology, psychiatry, cardiac, respiratory, lymph, extremities, GI, GU, Musculoskeletal, constitutional, breasts, reproductive, HEENT.  All pertinent positives are noted in the HPI.  All others are negative.  PHYSICAL EXAMINATION: ECOG PERFORMANCE STATUS: 1 - Symptomatic but completely ambulatory  . Vitals:   06/28/17 1205  BP: 132/76  Pulse: 73  Resp: 18  Temp: 98.1 F (36.7 C)  SpO2: 97%   Filed Weights   06/28/17 1205  Weight: 166 lb 3.2 oz (75.4 kg)   .Body mass index is 28.53 kg/m.  GENERAL:alert, in no acute distress and comfortable SKIN: skin color, texture, turgor are normal, no rashes  or significant lesions EYES: normal, conjunctiva are pink and non-injected, sclera clear OROPHARYNX:no exudate, no erythema and lips, buccal mucosa, and tongue normal  NECK: supple, no JVD,No inspiratory stridor, no facial swelling or upper extremity swelling. LYMPH: Extensive bilateral supraclavicular lymphadenopathy, no palpable inguinal or axillary lymphadenopathy noted. LUNGS: clear to auscultation with normal respiratory effort HEART: regular rate & rhythm,  no murmurs and no lower extremity edema ABDOMEN: abdomen soft, non-tender, normoactive bowel sounds  Musculoskeletal: no cyanosis of digits and no clubbing  PSYCH: alert & oriented x 3 with fluent speech NEURO: no focal  motor/sensory deficits  LABORATORY DATA:  I have reviewed the data as listed  . CBC Latest Ref Rng & Units 06/28/2017 05/27/2017 04/29/2017  WBC 3.9 - 10.3 10e3/uL 4.3 5.8 4.3  Hemoglobin 11.6 - 15.9 g/dL 10.2(L) 10.4(L) 9.5(L)  Hematocrit 34.8 - 46.6 % 31.2(L) 31.4(L) 28.6(L)  Platelets 145 - 400 10e3/uL 129(L) 115(L) 62(L)   . CMP Latest Ref Rng & Units 06/28/2017 05/27/2017 04/29/2017  Glucose 70 - 140 mg/dl 123 126 122  BUN 7.0 - 26.0 mg/dL 8.5 9.5 8.3  Creatinine 0.6 - 1.1 mg/dL 0.7 0.8 0.7  Sodium 136 - 145 mEq/L 139 138 139  Potassium 3.5 - 5.1 mEq/L 3.9 3.4(L) 3.8  Chloride 98 - 110 mmol/L - - -  CO2 22 - 29 mEq/L _0 Calcium 8.4 - 10.4 mg/dL 9.2 9.0 9.6  Total Protein 6.4 - 8.3 g/dL 6.3(L) 6.3(L) 6.5  Total Bilirubin 0.20 - 1.20 mg/dL 0.31 0.34 0.26  Alkaline Phos 40 - 150 U/L 74 78 93  AST 5 - 34 U/L _1 ALT 0 - 55 U/L _2 RADIOGRAPHIC STUDIES: I have personally reviewed the radiological images as listed and agreed with the findings in the report. No results found.  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   PET/CT scan  shows bulky hypermetabolic lymphadenopathy throughout the neck, chest, and abdomen. 1 cm hypermetabolic pelvic lymph node in right external iliac chain. Hypermetabolic masslike opacity in central right [name] with multiple adjacent hypermetabolic nodules. This is suspicious for pulmonary involvement by lymphoma  Fatigue anorexia or night sweats-drenching, and weight loss suggest constitutional type B symptoms.  Hepatitis profile and HIV neg  Patient is s/p 5 cycles of CHOEP tolerated well overall and received CHOP for 6th cycle due to significant cytopenias after 5th cycle of CHOEP. PET/CT on 01/08/2017 - showed near completely resolved LNadenopathy.  She received her autologous stem cell transplant with Dr Duanne Moron at [facility] on 04/13/2017 which she has tolerated well and is recovering well and resolving cytopenias.  Plan --Labs today are stable and improving well. Her platelets have improved to 129k and WBC has normalized to 4.3k with no neutropenia. --no fevers/chillis/infectious issues/bleeding or significant fatigue. -She has Day 100 f/u with Dr Duanne Moron on Dec. 20th and a PET/CT scheduled. 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 As of now, I am hesitant to give her a definitive answer, but as long as she continues to feel well and her counts normalize, I project she could return as long as she has reasonable accommodations at work.   -I shall plan to see her back in 2 months with labs to monitor her progress. She is aware that she can call us if she has any additional questions or concerns.   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  #4 history of sleep apnea -continue CPAP use  #5 severe anxiety - this has improved significantly. -Has been using Xanax when necessary.  -Cymbalta  continue  #6 persistent dry cough likely due to airway irritation from mediastinal adenopathy.  Appears to have  resolved with starting treatment.  -no evidence of lymphoma recurrence at this time -fu pet scan with dr lamar at [place]  on 12/13 -will defer to Dr. Dellis Filbert on need for 100 day bone marrow assessment  -RTC with Dr Irene Limbo in 2 months with labs   All of the patients questions were answered with apparent satisfaction. The patient knows to call the clinic with any problems, questions or concerns.  I spent 20 minutes counseling the patient face to face. The total time spent in the appointment was 25 minutes and more than 50% was on counseling and direct patient care and co-ordination of cares.    Sullivan Lone MD [place] [facility] Hematology/Oncology Physician [place]  (Office):       [phone] (Work cell):  [phone] (Fax):           [phone]  This document serves as a record of services personally performed by Sullivan Lone, MD. It was created on his behalf by Alean Rinne, a trained medical scribe. The creation of this record is based on the scribe's personal observations and the provider's statements to them.   .I have reviewed the above documentation for accuracy and completeness, and I agree with the above. Denise Genera MD Denise

## 2017-06-29 ENCOUNTER — Ambulatory Visit: Payer: BC Managed Care – PPO | Admitting: Physical Therapy

## 2017-06-29 DIAGNOSIS — M6281 Muscle weakness (generalized): Secondary | ICD-10-CM

## 2017-06-29 DIAGNOSIS — R29898 Other symptoms and signs involving the musculoskeletal system: Secondary | ICD-10-CM | POA: Diagnosis not present

## 2017-06-29 NOTE — Therapy (Signed)
[place], [place], [address] Phone: [phone]   Fax:  [phone]  Physical Therapy Treatment  Patient Details  Name: Denise [name] MRN: [id number] Date of Birth: [birth year] Referring Provider: Dr. Sullivan Lone   Encounter Date: 06/29/2017  PT End of Session - 06/29/17 1643    Visit Number  7    Number of Visits  9    Date for PT Re-Evaluation  07/16/17    PT Start Time  4403    PT Stop Time  4742    PT Time Calculation (min)  44 min    Activity Tolerance  Patient tolerated treatment well    Behavior During Therapy  [facility] for tasks assessed/performed       Past Medical History:  Diagnosis Date  . Allergy   . Asthma   . Bursitis of left hip   . Fibroids    TAH in 1999  . GERD (gastroesophageal reflux disease)   . Lymphoma ([place])    T cell lymphoma  . PVC's (premature ventricular contractions)   . Sleep apnea    does not use CPAP    Past Surgical History:  Procedure Laterality Date  . CARDIAC CATH  05/2007   J. BERRY  . HAMMER TOE SURGERY Bilateral 2016  . IR GENERIC HISTORICAL  11/05/2016   IR US GUIDE VASC ACCESS RIGHT 11/05/2016 [name], MD [facility]  . IR GENERIC HISTORICAL  11/05/2016   IR FLUORO GUIDE PORT INSERTION RIGHT 11/05/2016 [name], MD [facility]  . TOTAL ABDOMINAL HYSTERECTOMY  05/15/1998   secondary to fibroids  . WISDOM TOOTH EXTRACTION  mid teens    There were no vitals filed for this visit.  Subjective Assessment - 06/29/17 1306    Subjective  [place] I exercised except for one day--doing the wall slides and countertop pushups and some stretches. "I'm doing better going up the stairs," now doing it with reciprocal pattern, though still with railing.    Pertinent History  Diagnosed with lymphoma 09/23/16. Had CT scan and met with oncologist within two days, then [place] and chemotherapy, 6 rounds at [facility]. 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 Dellis Filbert at [facility].  I'm 62 days out from that and my immune system's kind of a baby.  HCTZ pill is for bloating, not high blood pressure; atenolol for irregular heartbeat since 2008. No back surgeries or joint replacements; had hysterectomy when she was 39; had two hammer toes fixed two years ago.    Currently in Pain?  No/denies                      [facility] Adult PT Treatment/Exercise - 06/29/17 0001      Lumbar Exercises: Standing   Other Standing Lumbar Exercises  hold 3 lb. weight: rotate to right to hand it to therapist standing behind her, who moves it to left, and then patient rotates right again to hand it off.  10x each way.      Knee/Hip Exercises: Aerobic   Nustep  '@L3'  x 8 mins, with SpO2 measured at halfway and at end, which were 94 and 93% respectively      Knee/Hip Exercises: Standing   Lateral Step Up  Right;Left;10 reps;Hand Hold: 2;Step Height: 6" +Airex foam pad on top of step; SpO2 87% after, so rested    Forward Step Up  Right;Left;10 reps;Hand Hold: 1;Step Height: 6" + Airex foam pad on top of step; SpO2 90% after, mod. SOB    Step Down  Right;Left;Hand Hold: 1;Step Height: 4" 10x with left foot stepping down; 4x with right due to pain    Other Standing Knee Exercises  yellow Theraband around ankles, walked 24 feet to left and to right with band for resistance SpO2 at 91% after this      Shoulder Exercises: Standing   Extension  Strengthening;Both;20 reps;Theraband    Theraband Level (Shoulder Extension)  Level 2 (Red)    Other Standing Exercises  3-way arm raises with 2 lb. weight in each hand x 10 each way; then bicep curls to overhead presses x 10 96% SpO2 following these                     [facility] Goals - 06/29/17 1646      CC Long Term Goal  #1   Title  Pt. will be independent with home exercise program for LE and core strengthening    Status  Partially Met       CC Long Term Goal  #2   Title  Pt. will report at least 60% improvement in pain at right periscapular area    Status  Achieved         Plan - 06/29/17 1644    Clinical Impression Statement  Pt. had a good weekend and notes functional improvement in that she can ascend steps with reciprocal instead of step-to pattern.  She does still need the railing.  In therapy, she is able to exercise more without her SpO2 dropping into the 80s so easily.  Back pain remains generally better.    Rehab Potential  Excellent    PT Frequency  2x / week    PT Duration  4 weeks    PT Treatment/Interventions  ADLs/Self Care Home Management;Gait training;Stair training;Functional mobility training;Therapeutic exercise;Patient/family education;Manual techniques;Passive range of motion;Dry needling    PT Next Visit Plan  Add to HEP next visit. Recheck goals.  Discuss with patient whether to continue therapy or transition to community exercise. Continue UE and LE strengthening and balance.    PT Home Exercise Plan  wall slides and countertop pushups, posterior shoulder stretches    Consulted and Agree with Plan of Care  Patient       Patient will benefit from skilled therapeutic intervention in order to improve the following deficits and impairments:  Abnormal gait, Decreased strength, Pain  Visit Diagnosis: Other symptoms and signs involving the musculoskeletal system  Muscle weakness (generalized)     Problem List Patient Active Problem List   Diagnosis Date Noted  . Symptomatic anemia   . Thrombocytopenia ([place])   . Chest pain 01/19/2017  . Dysphagia 01/19/2017  . Pancytopenia ([place]) 01/19/2017  . Hyperglycemia 01/19/2017  . Neutropenic fever ([place]) 01/18/2017  . 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 Herpes zoster without complication 27/78/2423  . Family history of heart disease in female family member before age 19 07/06/2016  . Arthritis 09/06/2014  .  Obesity (BMI 30-39.9) 09/06/2014  . Extrinsic asthma 08/20/2011  . Allergic rhinitis 08/20/2011  . Sleep apnea 08/20/2011  . PVC's (premature ventricular contractions) 08/20/2011  . GERD (gastroesophageal reflux disease) 08/20/2011    SALISBURY,DONNA 06/29/2017, 4:47 PM  [place], [place], [address] Phone: [phone]   Fax:  [phone]  Name: CARLENA RUYBAL MRN: [id number] Date of Birth: [birth year]  Serafina Royals, PT 06/29/17 4:48 PM

## 2017-06-30 ENCOUNTER — Encounter: Payer: BC Managed Care – PPO | Admitting: Physical Therapy

## 2017-06-30 ENCOUNTER — Telehealth: Payer: Self-pay

## 2017-06-30 NOTE — Telephone Encounter (Signed)
Left VM for patient in regards to most recent WBC and ANC. Both within normal limits per Dr. Irene Limbo, and the patient should not worry about the labs at this time.

## 2017-07-05 ENCOUNTER — Ambulatory Visit: Payer: BC Managed Care – PPO | Admitting: Physical Therapy

## 2017-07-05 DIAGNOSIS — R29898 Other symptoms and signs involving the musculoskeletal system: Secondary | ICD-10-CM | POA: Diagnosis not present

## 2017-07-05 DIAGNOSIS — M545 Low back pain, unspecified: Secondary | ICD-10-CM

## 2017-07-05 DIAGNOSIS — M6281 Muscle weakness (generalized): Secondary | ICD-10-CM

## 2017-07-05 NOTE — Therapy (Signed)
[place], [place], [address] Phone: [phone]   Fax:  [phone]  Physical Therapy Treatment  Patient Details  Name: Denise [name] MRN: [id number] Date of Birth: Apr 15, 1956 Referring Provider: Dr. Sullivan Lone   Encounter Date: 07/05/2017  PT End of Session - 07/05/17 2059    Visit Number  8    Number of Visits  9    Date for PT Re-Evaluation  07/16/17    PT Start Time  1302    PT Stop Time  1346    PT Time Calculation (min)  44 min    Activity Tolerance  Patient tolerated treatment well    Behavior During Therapy  [facility] for tasks assessed/performed       Past Medical History:  Diagnosis Date  . Allergy   . Asthma   . Bursitis of left hip   . Fibroids    TAH in 1999  . GERD (gastroesophageal reflux disease)   . Lymphoma ([place])    T cell lymphoma  . PVC's (premature ventricular contractions)   . Sleep apnea    does not use CPAP    Past Surgical History:  Procedure Laterality Date  . CARDIAC CATH  05/2007   J. BERRY  . HAMMER TOE SURGERY Bilateral 2016  . IR GENERIC HISTORICAL  11/05/2016   IR US GUIDE VASC ACCESS RIGHT 11/05/2016 [name], MD [facility]  . IR GENERIC HISTORICAL  11/05/2016   IR FLUORO GUIDE PORT INSERTION RIGHT 11/05/2016 [name], MD [facility]  . TOTAL ABDOMINAL HYSTERECTOMY  05/15/1998   secondary to fibroids  . WISDOM TOOTH EXTRACTION  mid teens    There were no vitals filed for this visit.  Subjective Assessment - 07/05/17 1304    Subjective  Had a good Thanksgiving and then Friendsgiving on Friday night.  I didn't feel bad all weekend--I'm getting better, I can tell. Did the exercises.    Pertinent History  Diagnosed with lymphoma 09/23/16. Had CT scan and met with oncologist within two days, then [place] and chemotherapy, 6 rounds at [facility]. 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 Dellis Filbert at [facility].  I'm  50 days out from that and my immune system's kind of a baby.  HCTZ pill is for bloating, not high blood pressure; atenolol for irregular heartbeat since 2008. No back surgeries or joint replacements; had hysterectomy when she was 39; had two hammer toes fixed two years ago.    Currently in Pain?  No/denies                      [facility] Adult PT Treatment/Exercise - 07/05/17 0001      Knee/Hip Exercises: Aerobic   Nustep  '@L3x'  10 mins.; SpO2 at 92-93 after 5 amd after 10 mins.      Knee/Hip Exercises: Standing   Hip Flexion  Stengthening;Right;Left;10 reps;Knee straight;2 sets vs. red Theraband    Hip Abduction  Right;Left;10 reps;Knee straight;2 sets vs. red Theraband    Hip Extension  Stengthening;Right;Left;10 reps;Knee straight;2 sets vs. red Theraband    Other Standing Knee Exercises  sidestep squats x 24 feet each way    Other Standing Knee Exercises  heel raises x 30 with hands on back of bike seat      Shoulder Exercises: Standing   Row  Both;20 reps;Theraband;Strengthening    Theraband Level (Shoulder Row)  Level 3 (Green)  Other Standing Exercises  vs. green Theraband, overhead press x 10 each arm             PT Education - 07/05/17 2055    Education provided  Yes    Education Details  standing SLRs vs. red Theraband; standing rowing vs. green Theraband;     Person(s) Educated  Patient    Methods  Explanation             [facility] Goals - 06/29/17 1646      CC Long Term Goal  #1   Title  Pt. will be independent with home exercise program for LE and core strengthening    Status  Partially Met      CC Long Term Goal  #2   Title  Pt. will report at least 60% improvement in pain at right periscapular area    Status  Achieved         Plan - 07/05/17 2100    Clinical Impression Statement  Pt. continues to do well and show progress.  She is now tolerating exercise better, generally keeping her O2 saturations in the low 90s during  exercise.  Therapist and patient agreed today to continue therapy sessions in December to help patient make continued prgress with improved endurance.    Rehab Potential  Excellent    PT Frequency  2x / week    PT Duration  4 weeks    PT Treatment/Interventions  ADLs/Self Care Home Management;Gait training;Stair training;Functional mobility training;Therapeutic exercise;Patient/family education;Manual techniques;Passive range of motion;Dry needling    PT Next Visit Plan  Plan to continue therapy into the next month. Goals have been partially met.  Continue ROM and strengtening.    PT Home Exercise Plan  wall slides and countertop pushups, posterior shoulder stretches    Consulted and Agree with Plan of Care  Patient       Patient will benefit from skilled therapeutic intervention in order to improve the following deficits and impairments:  Abnormal gait, Decreased strength, Pain  Visit Diagnosis: Other symptoms and signs involving the musculoskeletal system  Muscle weakness (generalized)  Bilateral low back pain without sciatica, unspecified chronicity     Problem List Patient Active Problem List   Diagnosis Date Noted  . Symptomatic anemia   . Thrombocytopenia ([place])   . Chest pain 01/19/2017  . Dysphagia 01/19/2017  . Pancytopenia ([place]) 01/19/2017  . Hyperglycemia 01/19/2017  . Neutropenic fever ([place]) 01/18/2017  . 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 Herpes zoster without complication 03/75/4360  . Family history of heart disease in female family member before age 1 07/06/2016  . Arthritis 09/06/2014  . Obesity (BMI 30-39.9) 09/06/2014  . Extrinsic asthma 08/20/2011  . Allergic rhinitis 08/20/2011  . Sleep apnea 08/20/2011  . PVC's (premature ventricular contractions) 08/20/2011  . GERD (gastroesophageal reflux disease) 08/20/2011    Alysen Smylie 07/05/2017, 9:16 PM  [place], [place], [address] Phone: [phone]   Fax:  [phone]  Name: Denise [name] MRN: [id number] Date of Birth: 1955-11-08  Serafina Royals, PT 07/05/17 9:16 PM

## 2017-07-05 NOTE — Patient Instructions (Signed)
ABDUCTION: Standing - Resistance Band (Active)   Stand, feet flat. Against red resistance band, lift right leg out to side. Complete _2__ sets of __10_ repetitions. Perform _1__ sessions per day.    Strengthening: Hip Flexion - Resisted   With tubing around left ankle, anchor behind, bring leg forward, keeping knee straight. Repeat __10__ times per set. Do __2__ sets per session. Do __1__ sessions per day.    Strengthening: Hip Extension - Resisted   With tubing around right ankle, face anchor and pull leg straight back. Repeat __10__ times per set. Do __2__ sets per session. Do __1__ sessions per day.    Row: Mid-Range - Standing    With green band anchored around a doorknob, pull elbows backward, squeezing shoulder blades together. Keep head and spine neutral. Row __20_ times, __1_ times per day.  http://ss.exer.us/291   Copyright  VHI. All rights reserved.    Overhead press with Theraband:  Stand with one end of YELLOW Theraband underfoot, other end gripped in one hand.  Raise Theraband up, punching it straight up toward ceiling.  Do 10 reps, 1-2 sets/day, for each arm.

## 2017-07-07 ENCOUNTER — Ambulatory Visit: Payer: BC Managed Care – PPO | Admitting: Physical Therapy

## 2017-07-07 DIAGNOSIS — M545 Low back pain, unspecified: Secondary | ICD-10-CM

## 2017-07-07 DIAGNOSIS — M6281 Muscle weakness (generalized): Secondary | ICD-10-CM

## 2017-07-07 DIAGNOSIS — R29898 Other symptoms and signs involving the musculoskeletal system: Secondary | ICD-10-CM

## 2017-07-07 DIAGNOSIS — R2689 Other abnormalities of gait and mobility: Secondary | ICD-10-CM

## 2017-07-07 NOTE — Therapy (Signed)
[place], [place], [address] Phone: [phone]   Fax:  [phone]  Physical Therapy Treatment  Patient Details  Name: Denise [name] MRN: [id number] Date of Birth: 07/18/1956 Referring Provider: Dr. Sullivan Lone   Encounter Date: 07/07/2017  PT End of Session - 07/07/17 2026    Visit Number  9    Number of Visits  17    Date for PT Re-Evaluation  08/09/17    PT Start Time  1350    PT Stop Time  1433    PT Time Calculation (min)  43 min    Activity Tolerance  Patient tolerated treatment well    Behavior During Therapy  [facility] for tasks assessed/performed       Past Medical History:  Diagnosis Date  . Allergy   . Asthma   . Bursitis of left hip   . Fibroids    TAH in 1999  . GERD (gastroesophageal reflux disease)   . Lymphoma ([place])    T cell lymphoma  . PVC's (premature ventricular contractions)   . Sleep apnea    does not use CPAP    Past Surgical History:  Procedure Laterality Date  . CARDIAC CATH  05/2007   J. BERRY  . HAMMER TOE SURGERY Bilateral 2016  . IR GENERIC HISTORICAL  11/05/2016   IR US GUIDE VASC ACCESS RIGHT 11/05/2016 [name], MD [facility]  . IR GENERIC HISTORICAL  11/05/2016   IR FLUORO GUIDE PORT INSERTION RIGHT 11/05/2016 [name], MD [facility]  . TOTAL ABDOMINAL HYSTERECTOMY  05/15/1998   secondary to fibroids  . WISDOM TOOTH EXTRACTION  mid teens    There were no vitals filed for this visit.  Subjective Assessment - 07/07/17 1352    Subjective  Met a friend for lunch today and it was good.  Monday my knees were hurting and I couldn't sleep.  I got up and took tylenol and aspirin, and they didn't hurt yesterday or last night.  I did the original exercises that you gave me but I didn't do the new ones.      Pertinent History  Diagnosed with lymphoma 09/23/16. Had CT scan and met with oncologist within two days, then [place] and chemotherapy, 6 rounds  at [facility]. 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 Dellis Filbert at [facility].  I'm 50 days out from that and my immune system's kind of a baby.  HCTZ pill is for bloating, not high blood pressure; atenolol for irregular heartbeat since 2008. No back surgeries or joint replacements; had hysterectomy when she was 39; had two hammer toes fixed two years ago.    Currently in Pain?  No/denies                      [facility] Adult PT Treatment/Exercise - 07/07/17 0001      Neuro Re-ed    Neuro Re-ed Details   tandem stance, back in corner, x30 seconds with each foot forward; single leg stance x 30 seconds on right; could only do about 10 seconds x 3 on the left      Knee/Hip Exercises: Aerobic   Nustep  '@L4'  x 10 mins.; O2 sat stayed in 94-95 range throughout      Knee/Hip Exercises: Standing   Heel Raises  Both;10 reps;20 reps;1 second 3 lb. weight in each hand    Hip Flexion  Stengthening;Right;Left;10 reps;Knee straight;2  sets vs. red Theraband    Hip Abduction  Right;Left;10 reps;Knee straight;2 sets vs. red Theraband    Hip Extension  Stengthening;Right;Left;10 reps;Knee straight;2 sets vs. red Theraband    Wall Squat  10 reps;10 seconds      Shoulder Exercises: Standing   Extension  Strengthening;Both;20 reps;Theraband    Theraband Level (Shoulder Extension)  Level 1 (Yellow)    Row  Both;20 reps;Theraband;Strengthening    Theraband Level (Shoulder Row)  Level 3 (Green)    Other Standing Exercises  wall push-ups x 10; overhead press vs. yellow Theraband x 10 each Rt. and Lt.                     [facility] Goals - 07/07/17 1431      CC Long Term Goal  #1   Title  Pt. will be independent with home exercise program for LE and core strengthening    Status  Partially Met      CC Long Term Goal  #2   Title  Pt. will report at least 60% improvement in pain at right periscapular area    Status  Achieved      CC Long Term  Goal  #3   Title  Pt. will be able to stand without pushing off with UEs from 17 inch high mat or chair.    Status  Achieved      CC Long Term Goal  #4   Title  Pt. will report being able to climb the stairs at home using railing but not needing to lean on the wall as well    Status  Achieved      CC Long Term Goal  #5   Title  Pt. will tolerate at least 15 minutes of continuous exercise without needing a break and without O2 saturation dropping below 90%.    Time  4    Period  Weeks    Status  New         Plan - 07/07/17 2027    Clinical Impression Statement  Pt. met two of her goals today.  She continues to increase her exercise with oxygen saturation now staying in the 90s, and with fewer rests. She is pleased with the milestones she hit today. Because she is benefitting from therapy, the plan is to continue for another month.    Rehab Potential  Excellent    PT Frequency  2x / week    PT Duration  4 weeks    PT Treatment/Interventions  ADLs/Self Care Home Management;Gait training;Stair training;Functional mobility training;Therapeutic exercise;Patient/family education;Manual techniques;Passive range of motion;Dry needling    PT Next Visit Plan  Continue Nustep, UE and LE strength and conditioning, progressing resistance and repetitions as able.  Progress HEP as able as well; see if patient plans to go to a gym.    PT Home Exercise Plan  wall slides and countertop pushups, posterior shoulder stretches, hip strengthening vs. Theraband, some UE motions vs. Theraband    Consulted and Agree with Plan of Care  Patient       Patient will benefit from skilled therapeutic intervention in order to improve the following deficits and impairments:  Abnormal gait, Decreased strength, Pain  Visit Diagnosis: Other symptoms and signs involving the musculoskeletal system - Plan: PT plan of care cert/re-cert  Muscle weakness (generalized) - Plan: PT plan of care cert/re-cert  Bilateral low back  pain without sciatica, unspecified chronicity - Plan: PT plan of care cert/re-cert  Other abnormalities of gait and mobility - Plan: PT plan of care cert/re-cert     Problem List Patient Active Problem List   Diagnosis Date Noted  . Symptomatic anemia   . Thrombocytopenia ([place])   . Chest pain 01/19/2017  . Dysphagia 01/19/2017  . Pancytopenia ([place]) 01/19/2017  . Hyperglycemia 01/19/2017  . Neutropenic fever ([place]) 01/18/2017  . 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 Herpes zoster without complication 45/91/3685  . Family history of heart disease in female family member before age 39 07/06/2016  . Arthritis 09/06/2014  . Obesity (BMI 30-39.9) 09/06/2014  . Extrinsic asthma 08/20/2011  . Allergic rhinitis 08/20/2011  . Sleep apnea 08/20/2011  . PVC's (premature ventricular contractions) 08/20/2011  . GERD (gastroesophageal reflux disease) 08/20/2011    Denise [name] 07/07/2017, 8:37 PM  [place], [place], [address] Phone: [phone]   Fax:  [phone]  Name: Denise [name] MRN: [id number] Date of Birth: 04/16/1956 Serafina Royals, PT 07/07/17 8:37 PM

## 2017-07-15 ENCOUNTER — Other Ambulatory Visit: Payer: Self-pay | Admitting: Family Medicine

## 2017-07-16 ENCOUNTER — Ambulatory Visit: Payer: BC Managed Care – PPO | Attending: Hematology | Admitting: Physical Therapy

## 2017-07-16 DIAGNOSIS — R29898 Other symptoms and signs involving the musculoskeletal system: Secondary | ICD-10-CM | POA: Insufficient documentation

## 2017-07-16 DIAGNOSIS — M6281 Muscle weakness (generalized): Secondary | ICD-10-CM | POA: Diagnosis present

## 2017-07-16 DIAGNOSIS — R2689 Other abnormalities of gait and mobility: Secondary | ICD-10-CM | POA: Diagnosis present

## 2017-07-16 DIAGNOSIS — M545 Low back pain: Secondary | ICD-10-CM | POA: Diagnosis present

## 2017-07-16 NOTE — Therapy (Signed)
[place], [place], [address] Phone: [phone]   Fax:  [phone]  Physical Therapy Treatment  Patient Details  Name: Denise [name] MRN: [id number] Date of Birth: 10/07/1955 Referring Provider: Dr. Sullivan Lone   Encounter Date: 07/16/2017  PT End of Session - 07/16/17 1113    Visit Number  10    Number of Visits  17    Date for PT Re-Evaluation  08/09/17    PT Start Time  0848    PT Stop Time  0930    PT Time Calculation (min)  42 min    Activity Tolerance  Patient tolerated treatment well    Behavior During Therapy  [facility] for tasks assessed/performed       Past Medical History:  Diagnosis Date  . Allergy   . Asthma   . Bursitis of left hip   . Fibroids    TAH in 1999  . GERD (gastroesophageal reflux disease)   . Lymphoma ([place])    T cell lymphoma  . PVC's (premature ventricular contractions)   . Sleep apnea    does not use CPAP    Past Surgical History:  Procedure Laterality Date  . CARDIAC CATH  05/2007   J. BERRY  . HAMMER TOE SURGERY Bilateral 2016  . IR GENERIC HISTORICAL  11/05/2016   IR US GUIDE VASC ACCESS RIGHT 11/05/2016 [name], MD [facility]  . IR GENERIC HISTORICAL  11/05/2016   IR FLUORO GUIDE PORT INSERTION RIGHT 11/05/2016 [name], MD [facility]  . TOTAL ABDOMINAL HYSTERECTOMY  05/15/1998   secondary to fibroids  . WISDOM TOOTH EXTRACTION  mid teens    There were no vitals filed for this visit.  Subjective Assessment - 07/16/17 0850    Subjective  I'm doing well.    Pertinent History  Diagnosed with lymphoma 09/23/16. Had CT scan and met with oncologist within two days, then [place] and chemotherapy, 6 rounds at [facility]. 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 Dellis Filbert at [facility].  I'm 50 days out from that and my immune system's kind of a baby.  HCTZ pill is for bloating, not high blood pressure; atenolol for  irregular heartbeat since 2008. No back surgeries or joint replacements; had hysterectomy when she was 39; had two hammer toes fixed two years ago.    Currently in Pain?  No/denies                      [facility] Adult PT Treatment/Exercise - 07/16/17 0001      Neuro Re-ed    Neuro Re-ed Details   tandem stance, back in corner, x30 seconds with each foot forward; standing on Airex pad with eyes closed x 30 seconds      Knee/Hip Exercises: Aerobic   Nustep  '@L4'  x 12 mins.; O2 sat. at 97% at 10 minutes.      Knee/Hip Exercises: Standing   Heel Raises  Both;10 reps;20 reps;1 second 3 lb. weight in each hand    Other Standing Knee Exercises  yellow Theraband around ankles, walked 24 feet x2 to left and to right with band for resistance SpO2 at 95% after this      Shoulder Exercises: Standing   Other Standing Exercises  3-way arm raises with 3 lb. weight in each hand x 10 each way; then bicep curls to overhead presses x 10 96% SpO2 following these; HR  130 bpm, down quickly      Shoulder Exercises: Therapy Ball   Flexion  10 reps 3 lb. weights on wrist    Other Therapy Ball Exercises  hold ball out in front, slight squat as pt. lowers ball, then straighten knees and raise ball up overhead, engaging abdominals x 7                     [facility] Goals - 07/07/17 1431      CC Long Term Goal  #1   Title  Pt. will be independent with home exercise program for LE and core strengthening    Status  Partially Met      CC Long Term Goal  #2   Title  Pt. will report at least 60% improvement in pain at right periscapular area    Status  Achieved      CC Long Term Goal  #3   Title  Pt. will be able to stand without pushing off with UEs from 17 inch high mat or chair.    Status  Achieved      CC Long Term Goal  #4   Title  Pt. will report being able to climb the stairs at home using railing but not needing to lean on the wall as well    Status  Achieved      CC  Long Term Goal  #5   Title  Pt. will tolerate at least 15 minutes of continuous exercise without needing a break and without O2 saturation dropping below 90%.    Time  4    Period  Weeks    Status  New         Plan - 07/16/17 1113    Clinical Impression Statement  Pt. is doing well. She is more energetic, is tolerating increased exercise, and her O2 saturations are stying in the mid 90s even during exericse    Rehab Potential  Excellent    PT Frequency  2x / week    PT Duration  4 weeks    PT Treatment/Interventions  ADLs/Self Care Home Management;Gait training;Stair training;Functional mobility training;Therapeutic exercise;Patient/family education;Manual techniques;Passive range of motion;Dry needling    PT Next Visit Plan  Continue Nustep, UE and LE strength and conditioning, progressing resistance and repetitions as able.  Progress HEP as able as well; see if patient plans to go to a gym.    PT Home Exercise Plan  wall slides and countertop pushups, posterior shoulder stretches, hip strengthening vs. Theraband, some UE motions vs. Theraband    Consulted and Agree with Plan of Care  Patient       Patient will benefit from skilled therapeutic intervention in order to improve the following deficits and impairments:  Abnormal gait, Decreased strength, Pain  Visit Diagnosis: Other symptoms and signs involving the musculoskeletal system  Muscle weakness (generalized)     Problem List Patient Active Problem List   Diagnosis Date Noted  . Symptomatic anemia   . Thrombocytopenia ([place])   . Chest pain 01/19/2017  . Dysphagia 01/19/2017  . Pancytopenia ([place]) 01/19/2017  . Hyperglycemia 01/19/2017  . Neutropenic fever ([place]) 01/18/2017  . 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 Herpes zoster without complication 34/19/6222  . Family history of heart disease in female family member before age 65 07/06/2016  . Arthritis 09/06/2014  . Obesity  (BMI 30-39.9) 09/06/2014  . Extrinsic asthma 08/20/2011  . Allergic  rhinitis 08/20/2011  . Sleep apnea 08/20/2011  . PVC's (premature ventricular contractions) 08/20/2011  . GERD (gastroesophageal reflux disease) 08/20/2011    SALISBURY,DONNA 07/16/2017, 11:15 AM  [place], [place], [address] Phone: [phone]   Fax:  [phone]  Name: Denise [name] MRN: [id number] Date of Birth: July 20, 1956  Serafina Royals, PT 07/16/17 11:15 AM

## 2017-07-19 ENCOUNTER — Encounter: Payer: BC Managed Care – PPO | Admitting: Physical Therapy

## 2017-07-21 ENCOUNTER — Ambulatory Visit: Payer: BC Managed Care – PPO | Admitting: Physical Therapy

## 2017-07-21 DIAGNOSIS — R2689 Other abnormalities of gait and mobility: Secondary | ICD-10-CM

## 2017-07-21 DIAGNOSIS — M6281 Muscle weakness (generalized): Secondary | ICD-10-CM

## 2017-07-21 DIAGNOSIS — M545 Low back pain, unspecified: Secondary | ICD-10-CM

## 2017-07-21 DIAGNOSIS — R29898 Other symptoms and signs involving the musculoskeletal system: Secondary | ICD-10-CM | POA: Diagnosis not present

## 2017-07-21 NOTE — Therapy (Signed)
[place], [place], [address] Phone: [phone]   Fax:  [phone]  Physical Therapy Treatment  Patient Details  Name: Denise [name] MRN: [id number] Date of Birth: [birth year] Referring Provider: Dr. Sullivan Lone   Encounter Date: 07/21/2017  PT End of Session - 07/21/17 1735    Visit Number  11    Number of Visits  17    Date for PT Re-Evaluation  08/09/17    PT Start Time  3335    PT Stop Time  1514    PT Time Calculation (min)  42 min    Activity Tolerance  Patient tolerated treatment well    Behavior During Therapy  [facility] for tasks assessed/performed       Past Medical History:  Diagnosis Date  . Allergy   . Asthma   . Bursitis of left hip   . Fibroids    TAH in 1999  . GERD (gastroesophageal reflux disease)   . Lymphoma ([place])    T cell lymphoma  . PVC's (premature ventricular contractions)   . Sleep apnea    does not use CPAP    Past Surgical History:  Procedure Laterality Date  . CARDIAC CATH  05/2007   J. BERRY  . HAMMER TOE SURGERY Bilateral 2016  . IR GENERIC HISTORICAL  11/05/2016   IR US GUIDE VASC ACCESS RIGHT 11/05/2016 [name], MD [facility]  . IR GENERIC HISTORICAL  11/05/2016   IR FLUORO GUIDE PORT INSERTION RIGHT 11/05/2016 [name], MD [facility]  . TOTAL ABDOMINAL HYSTERECTOMY  05/15/1998   secondary to fibroids  . WISDOM TOOTH EXTRACTION  mid teens    There were no vitals filed for this visit.  Subjective Assessment - 07/21/17 1434    Subjective  I shoveled--that was exercise! I also did your exercise.    Pertinent History  Diagnosed with lymphoma 09/23/16. Had CT scan and met with oncologist within two days, then [place] and chemotherapy, 6 rounds at [facility]. 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 Dellis Filbert at [facility].  I'm 50 days out from that and my immune system's kind of a baby.  HCTZ pill is for bloating,  not high blood pressure; atenolol for irregular heartbeat since 2008. No back surgeries or joint replacements; had hysterectomy when she was 39; had two hammer toes fixed two years ago.    Currently in Pain?  Yes    Pain Score  3     Pain Location  Foot    Pain Orientation  Right;Left    Pain Descriptors / Indicators  Aching    Aggravating Factors   being out shopping and on her feet    Pain Relieving Factors  getting off her feet                      [facility] Adult PT Treatment/Exercise - 07/21/17 0001      Elbow Exercises   Elbow Flexion  Strengthening;Right;Left;Bar weights/barbell;20 reps      Knee/Hip Exercises: Aerobic   Nustep  '@L5'  x 12 mins.; O2 sat. at 96% at 5 and 10 minutes.      Knee/Hip Exercises: Standing   Heel Raises  Both;20 reps;1 second;2 sets 3 lb. weight in each hand    Hip Flexion  Stengthening;Right;Left;10 reps;Knee straight 3 lb. weight each leg    Hip Abduction  Stengthening;Right;Left;10 reps 3 lb. weights  Hip Extension  Stengthening;Right;Left;10 reps;Knee straight 3 lb. weight each leg    Lateral Step Up  Right;Left;10 reps;Hand Hold: 2;Step Height: 6" +Airex foam pad on top of step; SpO2 95% after; mod SOB    Forward Step Up  Right;Left;10 reps;Hand Hold: 1;Step Height: 6" + Airex foam pad on top of step; mod. dyspnea, 92% SpO2 p    Other Standing Knee Exercises  mini squat x 4, but left knee pop      Shoulder Exercises: Standing   Other Standing Exercises  3-way arm raises with 3 lb. weight in each hand x 10 each way; then bicep curls to overhead presses x 10 96% SpO2 following these; HR 130 bpm, down quickly    Other Standing Exercises  overhead presses 3 lbs. each hand x 20      Shoulder Exercises: Therapy Ball   Flexion  10 reps 3 lb. weights on wrist                     [facility] Goals - 07/07/17 1431      CC Long Term Goal  #1   Title  Pt. will be independent with home exercise program for LE and core  strengthening    Status  Partially Met      CC Long Term Goal  #2   Title  Pt. will report at least 60% improvement in pain at right periscapular area    Status  Achieved      CC Long Term Goal  #3   Title  Pt. will be able to stand without pushing off with UEs from 17 inch high mat or chair.    Status  Achieved      CC Long Term Goal  #4   Title  Pt. will report being able to climb the stairs at home using railing but not needing to lean on the wall as well    Status  Achieved      CC Long Term Goal  #5   Title  Pt. will tolerate at least 15 minutes of continuous exercise without needing a break and without O2 saturation dropping below 90%.    Time  4    Period  Weeks    Status  New         Plan - 07/21/17 1735    Clinical Impression Statement  Pt. continues to do well with increasing exercise and with SpO2 staying in the 90s throughout exercise. She even reported that she did some shoveling of our 11 inches of snow in the past few days.    Rehab Potential  Excellent    PT Frequency  2x / week    PT Duration  4 weeks    PT Treatment/Interventions  ADLs/Self Care Home Management;Gait training;Stair training;Functional mobility training;Therapeutic exercise;Patient/family education;Manual techniques;Passive range of motion;Dry needling    PT Next Visit Plan  Continue Nustep, UE and LE strength and conditioning, progressing resistance and repetitions as able.  Progress HEP as able as well; see if patient plans to go to a gym.    PT Home Exercise Plan  wall slides and countertop pushups, posterior shoulder stretches, hip strengthening vs. Theraband, some UE motions vs. Theraband    Consulted and Agree with Plan of Care  Patient       Patient will benefit from skilled therapeutic intervention in order to improve the following deficits and impairments:  Abnormal gait, Decreased strength, Pain  Visit Diagnosis: Other symptoms  and signs involving the musculoskeletal system  Muscle  weakness (generalized)  Bilateral low back pain without sciatica, unspecified chronicity  Other abnormalities of gait and mobility     Problem List Patient Active Problem List   Diagnosis Date Noted  . Symptomatic anemia   . Thrombocytopenia ([place])   . Chest pain 01/19/2017  . Dysphagia 01/19/2017  . Pancytopenia ([place]) 01/19/2017  . Hyperglycemia 01/19/2017  . Neutropenic fever ([place] [place]) 01/18/2017  . 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 Herpes zoster without complication 98/05/2547  . Family history of heart disease in female family member before age 31 07/06/2016  . Arthritis 09/06/2014  . Obesity (BMI 30-39.9) 09/06/2014  . Extrinsic asthma 08/20/2011  . Allergic rhinitis 08/20/2011  . Sleep apnea 08/20/2011  . PVC's (premature ventricular contractions) 08/20/2011  . GERD (gastroesophageal reflux disease) 08/20/2011    SALISBURY,DONNA 07/21/2017, 5:38 PM  [place], [place], [address] Phone: [phone]   Fax:  [phone]  Name: Denise [name] MRN: [id number] Date of Birth: [birth year]  Serafina Royals, PT 07/21/17 5:38 PM

## 2017-07-25 ENCOUNTER — Other Ambulatory Visit: Payer: Self-pay | Admitting: Family Medicine

## 2017-07-26 ENCOUNTER — Ambulatory Visit: Payer: BC Managed Care – PPO | Admitting: Physical Therapy

## 2017-07-26 DIAGNOSIS — M6281 Muscle weakness (generalized): Secondary | ICD-10-CM

## 2017-07-26 DIAGNOSIS — R29898 Other symptoms and signs involving the musculoskeletal system: Secondary | ICD-10-CM | POA: Diagnosis not present

## 2017-07-26 NOTE — Therapy (Signed)
[place], [place], [address] Phone: [phone]   Fax:  [phone]  Physical Therapy Treatment  Patient Details  Name: Denise [name] MRN: [id number] Date of Birth: 30-Aug-1955 Referring Provider: Dr. Sullivan Lone   Encounter Date: 07/26/2017  PT End of Session - 07/26/17 1729    Visit Number  12    Number of Visits  17    Date for PT Re-Evaluation  08/09/17    PT Start Time  1300    PT Stop Time  1346    PT Time Calculation (min)  46 min    Activity Tolerance  Patient tolerated treatment well    Behavior During Therapy  [facility] for tasks assessed/performed       Past Medical History:  Diagnosis Date  . Allergy   . Asthma   . Bursitis of left hip   . Fibroids    TAH in 1999  . GERD (gastroesophageal reflux disease)   . Lymphoma ([place])    T cell lymphoma  . PVC's (premature ventricular contractions)   . Sleep apnea    does not use CPAP    Past Surgical History:  Procedure Laterality Date  . CARDIAC CATH  05/2007   J. BERRY  . HAMMER TOE SURGERY Bilateral 2016  . IR GENERIC HISTORICAL  11/05/2016   IR US GUIDE VASC ACCESS RIGHT 11/05/2016 [name], MD [facility]  . IR GENERIC HISTORICAL  11/05/2016   IR FLUORO GUIDE PORT INSERTION RIGHT 11/05/2016 [name], MD [facility]  . TOTAL ABDOMINAL HYSTERECTOMY  05/15/1998   secondary to fibroids  . WISDOM TOOTH EXTRACTION  mid teens    There were no vitals filed for this visit.  Subjective Assessment - 07/26/17 1303    Subjective  I had a scan Thursday.  I got a call Friday and there's no lymphoma.    Pertinent History  Diagnosed with lymphoma 09/23/16. Had CT scan and met with oncologist within two days, then [place] and chemotherapy, 6 rounds at [facility]. 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 Dellis Filbert at [facility].  I'm 50 days out from that and my immune system's kind of a baby.  HCTZ pill is  for bloating, not high blood pressure; atenolol for irregular heartbeat since 2008. No back surgeries or joint replacements; had hysterectomy when she was 39; had two hammer toes fixed two years ago.    Currently in Pain?  No/denies                      [facility] Adult PT Treatment/Exercise - 07/26/17 0001      Lumbar Exercises: Supine   Bridge  10 reps;1 second bridge with knees apart/together 10x    Straight Leg Raise  10 reps;1 second each side, right and left    Other Supine Lumbar Exercises  crunches x 10; opposite arm lifting to opposite knee x 10 each;      Knee/Hip Exercises: Aerobic   Nustep  '@L5'  x 12 mins.; O2 sat. at 95% at the end      Knee/Hip Exercises: Standing   Heel Raises  Both;20 reps;1 second;2 sets 3 lb. weight in each hand    Other Standing Knee Exercises  step back back with one foot, then bring that knee forward and up in front x 30 seconds each, one hand on back of bike seat x 30 seconds each side.  Shoulder Exercises: Supine   Other Supine Exercises  pect flies 3 lbs. each hand x 10 x 2      Shoulder Exercises: Standing   Other Standing Exercises  3-way arm raises with 3 lb. weight in each hand x 10 each way;    Other Standing Exercises  modified jumping jacks (one leg only with both hands going overhead) x 30 seconds each.                     [facility] Goals - 07/07/17 1431      CC Long Term Goal  #1   Title  Pt. will be independent with home exercise program for LE and core strengthening    Status  Partially Met      CC Long Term Goal  #2   Title  Pt. will report at least 60% improvement in pain at right periscapular area    Status  Achieved      CC Long Term Goal  #3   Title  Pt. will be able to stand without pushing off with UEs from 17 inch high mat or chair.    Status  Achieved      CC Long Term Goal  #4   Title  Pt. will report being able to climb the stairs at home using railing but not needing to lean on  the wall as well    Status  Achieved      CC Long Term Goal  #5   Title  Pt. will tolerate at least 15 minutes of continuous exercise without needing a break and without O2 saturation dropping below 90%.    Time  4    Period  Weeks    Status  New         Plan - 07/26/17 1729    Clinical Impression Statement  Pt. continues to do well.  [facility] [employer] [facility].  She had good news with a clean scan this past week, but that good news was offset by sadness over the sudden death of her dog.    Rehab Potential  Excellent    PT Frequency  2x / week    PT Duration  4 weeks    PT Treatment/Interventions  ADLs/Self Care Home Management;Gait training;Stair training;Functional mobility training;Therapeutic exercise;Patient/family education;Manual techniques;Passive range of motion;Dry needling    PT Next Visit Plan  Continue Nustep, UE and LE strength and conditioning, progressing resistance and repetitions as able.  Progress HEP as able as well; see if patient plans to go to a gym.    PT Home Exercise Plan  wall slides and countertop pushups, posterior shoulder stretches, hip strengthening vs. Theraband, some UE motions vs. Theraband    Consulted and Agree with Plan of Care  Patient       Patient will benefit from skilled therapeutic intervention in order to improve the following deficits and impairments:  Abnormal gait, Decreased strength, Pain  Visit Diagnosis: Other symptoms and signs involving the musculoskeletal system  Muscle weakness (generalized)     Problem List Patient Active Problem List   Diagnosis Date Noted  . Symptomatic anemia   . Thrombocytopenia ([place])   . Chest pain 01/19/2017  . Dysphagia 01/19/2017  . Pancytopenia ([place]) 01/19/2017  . Hyperglycemia 01/19/2017  . Neutropenic fever ([place]) 01/18/2017  . 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 Herpes  zoster without  complication 14/97/0263  . Family history of heart disease in female family member before age 58 07/06/2016  . Arthritis 09/06/2014  . Obesity (BMI 30-39.9) 09/06/2014  . Extrinsic asthma 08/20/2011  . Allergic rhinitis 08/20/2011  . Sleep apnea 08/20/2011  . PVC's (premature ventricular contractions) 08/20/2011  . GERD (gastroesophageal reflux disease) 08/20/2011    Demontre Padin 07/26/2017, 5:31 PM  [place], [place], [address] Phone: [phone]   Fax:  [phone]  Name: Denise [name] MRN: [id number] Date of Birth: 1955-08-24  Serafina Royals, PT 07/26/17 5:31 PM

## 2017-07-28 ENCOUNTER — Ambulatory Visit: Payer: BC Managed Care – PPO | Admitting: Physical Therapy

## 2017-07-28 DIAGNOSIS — R29898 Other symptoms and signs involving the musculoskeletal system: Secondary | ICD-10-CM | POA: Diagnosis not present

## 2017-07-28 DIAGNOSIS — M6281 Muscle weakness (generalized): Secondary | ICD-10-CM

## 2017-07-28 NOTE — Therapy (Addendum)
[place], [place], [address] Phone: [phone]   Fax:  [phone]  Physical Therapy Treatment  Patient Details  Name: Denise [name] MRN: [id number] Date of Birth: 05/30/1956 Referring Provider: Dr. Sullivan Lone   Encounter Date: 07/28/2017  PT End of Session - 07/28/17 2025    Visit Number  13    Number of Visits  17    Date for PT Re-Evaluation  08/09/17    PT Start Time  1443    PT Stop Time  1517    PT Time Calculation (min)  46 min    Activity Tolerance  Patient tolerated treatment well    Behavior During Therapy  [facility] for tasks assessed/performed       Past Medical History:  Diagnosis Date  . Allergy   . Asthma   . Bursitis of left hip   . Fibroids    TAH in 1999  . GERD (gastroesophageal reflux disease)   . Lymphoma ([place])    T cell lymphoma  . PVC's (premature ventricular contractions)   . Sleep apnea    does not use CPAP    Past Surgical History:  Procedure Laterality Date  . CARDIAC CATH  05/2007   J. BERRY  . HAMMER TOE SURGERY Bilateral 2016  . IR GENERIC HISTORICAL  11/05/2016   IR US GUIDE VASC ACCESS RIGHT 11/05/2016 [name], MD [facility]  . IR GENERIC HISTORICAL  11/05/2016   IR FLUORO GUIDE PORT INSERTION RIGHT 11/05/2016 [name], MD [facility]  . TOTAL ABDOMINAL HYSTERECTOMY  05/15/1998   secondary to fibroids  . WISDOM TOOTH EXTRACTION  mid teens    There were no vitals filed for this visit.  Subjective Assessment - 07/28/17 1432    Subjective  Not crying over the dog all the time.    Pertinent History  Diagnosed with lymphoma 09/23/16. Had CT scan and met with oncologist within two days, then Deepstep and chemotherapy, 6 rounds at [facility]. 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 Dellis Filbert at [facility].  I'm 50 days out from that and my immune system's kind of a baby.  HCTZ pill is for bloating, not high blood  pressure; atenolol for irregular heartbeat since 2008. No back surgeries or joint replacements; had hysterectomy when she was 39; had two hammer toes fixed two years ago.    Currently in Pain?  No/denies                      [facility] Adult PT Treatment/Exercise - 07/28/17 0001      Neuro Re-ed    Neuro Re-ed Details   standing on dome part of BOSU ball (in treadmill for UE support on handles), release UE support and hold; then weight shift side to side; then briefly hold with eyes closed      Knee/Hip Exercises: Aerobic   Tread Mill  at 1.5 mph x 4 mins., then 1.7 mph x 6 more minutes    Nustep  _0  x 15 mins. SpO2 = 98% at end      Knee/Hip Exercises: Standing   Lateral Step Up  Right;Left;Hand Hold: 2;Step Height: 6" +Airex pad on top of step; 12 reps each way    Forward Step Up  Right;Left;Hand Hold: 1;Step Height: 6" +Airex pad on top of step; 12 reps each leg      Shoulder Exercises: Standing   Other  Standing Exercises  3-way arm raises with 3 lb. weight in each hand x 10 each way;                     [facility] Goals - 07/28/17 2024      CC Long Term Goal  #5   Title  Pt. will tolerate at least 15 minutes of continuous exercise without needing a break and without O2 saturation dropping below 90%.    Status  Achieved         Plan - 07/28/17 2026    Clinical Impression Statement  Pt. met the goal today to tolerate 15 minutes of continuous exercise.  She should be ready for discharge at next visit.    Rehab Potential  Excellent    PT Frequency  2x / week    PT Duration  4 weeks    PT Treatment/Interventions  ADLs/Self Care Home Management;Gait training;Stair training;Functional mobility training;Therapeutic exercise;Patient/family education;Manual techniques;Passive range of motion;Dry needling    PT Next Visit Plan  Check goals; probable discharge next visit. Check on HEP needs; continue general conditioning.    PT Home Exercise Plan  wall  slides and countertop pushups, posterior shoulder stretches, hip strengthening vs. Theraband, some UE motions vs. Theraband    Consulted and Agree with Plan of Care  Patient       Patient will benefit from skilled therapeutic intervention in order to improve the following deficits and impairments:  Abnormal gait, Decreased strength, Pain  Visit Diagnosis: Other symptoms and signs involving the musculoskeletal system  Muscle weakness (generalized)     Problem List Patient Active Problem List   Diagnosis Date Noted  . Symptomatic anemia   . Thrombocytopenia ([place])   . Chest pain 01/19/2017  . Dysphagia 01/19/2017  . Pancytopenia ([place]) 01/19/2017  . Hyperglycemia 01/19/2017  . Neutropenic fever ([place]) 01/18/2017  . 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 Herpes zoster without complication 07/25/2445  . Family history of heart disease in female family member before age 13 07/06/2016  . Arthritis 09/06/2014  . Obesity (BMI 30-39.9) 09/06/2014  . Extrinsic asthma 08/20/2011  . Allergic rhinitis 08/20/2011  . Sleep apnea 08/20/2011  . PVC's (premature ventricular contractions) 08/20/2011  . GERD (gastroesophageal reflux disease) 08/20/2011    SALISBURY,DONNA 07/28/2017, 8:30 PM  [place], [place], [address] Phone: [phone]   Fax:  [phone]  Name: Denise [name] MRN: [id number] Date of Birth: 07-09-1956  Serafina Royals, PT 07/28/17 8:30 PM  PHYSICAL THERAPY DISCHARGE SUMMARY  Visits from Start of Care: 13  Current functional level related to goals / functional outcomes: Goals met as noted above.   Remaining deficits: None noted. Patient was to come for one last treatment and did not, so final assessment was not done.   Education / Equipment: Home exercise program. Plan: Patient agrees to discharge.  Patient goals were met. Patient is  being discharged due to meeting the stated rehab goals.  ?????     Serafina Royals, PT 11/30/17 8:37 AM

## 2017-08-04 ENCOUNTER — Encounter: Payer: BC Managed Care – PPO | Admitting: Physical Therapy

## 2017-08-06 ENCOUNTER — Ambulatory Visit: Payer: BC Managed Care – PPO | Admitting: Physical Therapy

## 2017-08-18 ENCOUNTER — Other Ambulatory Visit: Payer: Self-pay | Admitting: Family Medicine

## 2017-08-19 NOTE — Telephone Encounter (Signed)
Is this okay to refill? 

## 2017-08-24 ENCOUNTER — Other Ambulatory Visit: Payer: Self-pay

## 2017-08-24 ENCOUNTER — Other Ambulatory Visit: Payer: Self-pay | Admitting: Hematology

## 2017-08-24 MED ORDER — DULOXETINE HCL 30 MG PO CPEP: ORAL_CAPSULE | ORAL | 2 refills | Status: DC

## 2017-08-26 ENCOUNTER — Inpatient Hospital Stay: Payer: BC Managed Care – PPO | Attending: Hematology | Admitting: Hematology

## 2017-08-26 ENCOUNTER — Telehealth: Payer: Self-pay | Admitting: Hematology

## 2017-08-26 ENCOUNTER — Inpatient Hospital Stay: Payer: BC Managed Care – PPO

## 2017-08-26 ENCOUNTER — Encounter: Payer: Self-pay | Admitting: Hematology

## 2017-08-26 VITALS — BP 143/70 | HR 68 | Temp 98.1°F | Resp 20 | Ht 64.0 in | Wt 172.5 lb

## 2017-08-26 DIAGNOSIS — C8448 Peripheral T-cell lymphoma, not classified, lymph nodes of multiple sites: Secondary | ICD-10-CM

## 2017-08-26 DIAGNOSIS — D696 Thrombocytopenia, unspecified: Secondary | ICD-10-CM

## 2017-08-26 DIAGNOSIS — Z8572 Personal history of non-Hodgkin lymphomas: Secondary | ICD-10-CM | POA: Diagnosis not present

## 2017-08-26 DIAGNOSIS — Z9484 Stem cells transplant status: Secondary | ICD-10-CM | POA: Diagnosis not present

## 2017-08-26 LAB — COMPREHENSIVE METABOLIC PANEL
ALK PHOS: 104 U/L (ref 40–150)
ALT: 14 U/L (ref 0–55)
AST: 18 U/L (ref 5–34)
Albumin: 3.8 g/dL (ref 3.5–5.0)
Anion gap: 9 (ref 3–11)
BUN: 11 mg/dL (ref 7–26)
CALCIUM: 9.1 mg/dL (ref 8.4–10.4)
CHLORIDE: 102 mmol/L (ref 98–109)
CO2: 29 mmol/L (ref 22–29)
CREATININE: 0.68 mg/dL (ref 0.60–1.10)
GFR calc Af Amer: >60 mL/min (ref >60)
GFR calc non Af Amer: >60 mL/min (ref >60)
GLUCOSE: 103 mg/dL (ref 70–140)
Potassium: 4 mmol/L (ref 3.3–4.7)
SODIUM: 140 mmol/L (ref 136–145)
Total Bilirubin: 0.3 mg/dL (ref 0.2–1.2)
Total Protein: 6.5 g/dL (ref 6.4–8.3)

## 2017-08-26 LAB — CBC WITH DIFFERENTIAL (CANCER CENTER ONLY)
Basophils Absolute: 0 10*3/uL (ref 0.0–0.1)
Basophils Relative: 0 %
Eosinophils Absolute: 0.1 10*3/uL (ref 0.0–0.5)
Eosinophils Relative: 2 %
HCT: 35.8 % (ref 34.8–46.6)
Hemoglobin: 11.8 g/dL (ref 11.6–15.9)
LYMPHS ABS: 0.8 10*3/uL — AB (ref 0.9–3.3)
LYMPHS PCT: 16 %
MCH: 29.1 pg (ref 25.1–34.0)
MCHC: 33 g/dL (ref 31.5–36.0)
MCV: 88.4 fL (ref 79.5–101.0)
MONO ABS: 0.4 10*3/uL (ref 0.1–0.9)
MONOS PCT: 8 %
Neutro Abs: 4 10*3/uL (ref 1.5–6.5)
Neutrophils Relative %: 74 %
PLATELETS: 135 10*3/uL — AB (ref 145–400)
RBC: 4.05 MIL/uL (ref 3.70–5.45)
RDW: 13.4 % (ref 11.2–16.1)
WBC Count: 5.4 10*3/uL (ref 3.9–10.3)

## 2017-08-26 LAB — RETICULOCYTES
RBC.: 4.05 MIL/uL (ref 3.70–5.45)
Retic Count, Absolute: 52.7 10*3/uL (ref 33.7–90.7)
Retic Ct Pct: 1.3 % (ref 0.7–2.1)

## 2017-08-26 LAB — LACTATE DEHYDROGENASE: LDH: 200 U/L (ref 125–245)

## 2017-08-26 NOTE — Progress Notes (Signed)
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 She is accompanied by her partner. She notes that she is doing well overall. She reports that she is back to work now. She is happy that she completed PT as it has been a tremendous help. She had a great holiday season and stayed local with family. She follows up with Dr. Dellis Filbert in March 2019 and she is unsure for a repeated bone marrow biopsy. She would like to know if she can obtain a mammogram or have a dental cleaning.   Her Hgb is 11.8 today, 08/26/17, platelets have decreased to 135k, and lymphs abs at 0.8. Comprehensive metabolic panel and reticulocytes WNL. LDH Pending today.   Since her last visit to the office, she underwent a PET scan on 07/22/2017 showed No FDG avid malignant disease .  On review of systems, she reports a mild cough and mild visual changes. She is going to an optometrist next Wednesday to have her eyes evaluated. Her last optometric evaluation was 4 years ago and she notes that she has issues with near and far vision. She denies dry eyes, eye redness, decreased appetite, back pain, abdominal pain, breast nodules, and any other symptoms.     MEDICAL HISTORY:  Past Medical History:  Diagnosis Date  . Allergy   . Asthma   . Bursitis of left hip   . Fibroids    TAH in 1999  . GERD (gastroesophageal reflux disease)   . Lymphoma ([place])    T cell lymphoma  . PVC's (premature ventricular contractions)   . Sleep apnea    does not use CPAP    SURGICAL HISTORY: Past Surgical History:  Procedure Laterality Date  . CARDIAC CATH  05/2007     J. BERRY  . HAMMER TOE SURGERY Bilateral 2016  . IR GENERIC HISTORICAL  11/05/2016   IR US GUIDE VASC ACCESS RIGHT 11/05/2016 [name], MD [facility]  . IR GENERIC HISTORICAL  11/05/2016   IR FLUORO GUIDE PORT INSERTION RIGHT 11/05/2016 [name], MD [facility]  . TOTAL ABDOMINAL HYSTERECTOMY  05/15/1998   secondary to fibroids  . WISDOM TOOTH EXTRACTION  mid teens    SOCIAL HISTORY: Social History   Socioeconomic History  . Marital status: Significant Other    Spouse name: Not on file  . Number of children: 0  . Years of education: Not on file  . Highest education level: Not on file  Social Needs  . Financial resource strain: Not on file  . Food insecurity - worry: Not on file  . Food insecurity - inability: Not on file  . Transportation needs - medical: Not on file  . Transportation needs - non-medical: Not on file  Occupational History  . 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[employer]: [employer]  Tobacco Use  . Smoking status: Never Smoker  . Smokeless tobacco: Never Used  Substance and Sexual Activity  . Alcohol use: No    Frequency: Never  . Drug use: No  . Sexual activity: Yes    Partners: Female  Birth control/protection: None  Other Topics Concern  . Not on file  Social History Narrative  . Not on file    FAMILY HISTORY: Family History  Problem Relation Age of Onset  . Cancer Maternal Grandmother        GI cancer in 84's  . Dementia Mother   . Heart disease Father   . Heart failure Father     ALLERGIES:  is allergic to morphine and related.  MEDICATIONS:  Current Outpatient Medications  Medication Sig Dispense Refill  . acyclovir (ZOVIRAX) 400 MG tablet TAKE 1 TABLET(400 MG) BY MOUTH TWICE DAILY 60 tablet 0  . ADVAIR DISKUS 100-50 MCG/DOSE AEPB Inhale 1 puff into the lungs daily as needed (sob and wheezing).     Denise Kitchen albuterol (PROVENTIL HFA;VENTOLIN HFA) 108 (90 Base) MCG/ACT inhaler Inhale 2 puffs into the  lungs every 6 (six) hours as needed for wheezing or shortness of breath. 1 Inhaler 0  . ALPRAZolam (XANAX) 0.25 MG tablet TAKE 1 TABLET BY MOUTH TWICE A DAY AS NEEDED FOR ANXIETY 50 tablet 0  . aspirin 81 MG chewable tablet Chew 81 mg by mouth daily as needed for moderate pain.     Denise Kitchen atenolol (TENORMIN) 50 MG tablet TAKE 1 TABLET BY MOUTH TWICE DAILY. 180 tablet 0  . DULoxetine (CYMBALTA) 30 MG capsule TAKE 1 CAPSULE(30 MG) BY MOUTH DAILY 30 capsule 2  . esomeprazole (NEXIUM) 40 MG capsule Take 1 capsule (40 mg total) by mouth daily before breakfast. (Patient taking differently: Take 40 mg by mouth daily as needed (indigestion). ) 100 capsule 0  . fluconazole (DIFLUCAN) 100 MG tablet Take 1 tablet (100 mg total) by mouth daily. Take 2 tabs on day 1 then 1 tab po daily x 12 days 14 tablet 0  . hydrochlorothiazide (MICROZIDE) 12.5 MG capsule TAKE 1 CAPSULE BY MOUTH EVERY DAY 30 capsule 0  . meloxicam (MOBIC) 15 MG tablet TAKE 1 TABLET BY MOUTH EVERY DAY AS NEEDED FOR MUSCLE SPASMS 30 tablet 2  . triazolam (HALCION) 0.25 MG tablet TAKE 1 TABLET BY MOUTH EVERY NIGHT AT BEDTIME AS NEEDED FOR SLEEP 30 tablet 0  . valACYclovir (VALTREX) 1000 MG tablet Take 1 tablet (1,000 mg total) by mouth 2 (two) times daily. 4 tablet 0   No current facility-administered medications for this visit.    Facility-Administered Medications Ordered in Other Visits  Medication Dose Route Frequency Provider Last Rate Last Dose  . sodium chloride flush (NS) 0.9 % injection 10 mL  10 mL Intracatheter PRN Brunetta Genera, MD   10 mL at 12/01/16 1731    REVIEW OF SYSTEMS:    A 10+ POINT REVIEW OF SYSTEMS WAS OBTAINED including neurology, dermatology, psychiatry, cardiac, respiratory, lymph, extremities, GI, GU, Musculoskeletal, constitutional, breasts, reproductive, HEENT.  All pertinent positives are noted in the HPI.  All others are negative.  PHYSICAL EXAMINATION:  ECOG PERFORMANCE STATUS: 1 - Symptomatic but  completely ambulatory  . Vitals:   08/26/17 0837  BP: (!) 143/70  Pulse: 68  Resp: 20  Temp: 98.1 F (36.7 C)  SpO2: 99%   Filed Weights   08/26/17 0837  Weight: 172 lb 8 oz (78.2 kg)   .Body mass index is 29.61 kg/m.  GENERAL:alert, in no acute distress and comfortable SKIN: skin color, texture, turgor are normal, no rashes or significant lesions EYES: normal, conjunctiva are pink and non-injected, sclera clear OROPHARYNX:no exudate, no erythema and lips, buccal mucosa, and tongue normal  NECK: supple, no JVD,No  inspiratory stridor, no facial swelling or upper extremity swelling. LYMPH: Extensive bilateral supraclavicular lymphadenopathy, no palpable inguinal or axillary lymphadenopathy noted. LUNGS: clear to auscultation with normal respiratory effort HEART: regular rate & rhythm,  no murmurs and no lower extremity edema ABDOMEN: abdomen soft, non-tender, normoactive bowel sounds  Musculoskeletal: no cyanosis of digits and no clubbing  PSYCH: alert & oriented x 3 with fluent speech NEURO: no focal motor/sensory deficits  LABORATORY DATA:  I have reviewed the data as listed  . CBC Latest Ref Rng & Units 08/26/2017 06/28/2017 05/27/2017  WBC 3.9 - 10.3 K/uL 5.4 4.3 5.8  Hemoglobin 11.6 - 15.9 g/dL - 10.2(L) 10.4(L)  Hematocrit 34.8 - 46.6 % 35.8 31.2(L) 31.4(L)  Platelets 145 - 400 K/uL 135(L) 129(L) 115(L)   . CMP Latest Ref Rng & Units 08/26/2017 06/28/2017 05/27/2017  Glucose 70 - 140 mg/dL 103 123 126  BUN 7 - 26 mg/dL 11 8.5 9.5  Creatinine 0.60 - 1.10 mg/dL 0.68 0.7 0.8  Sodium 136 - 145 mmol/L 140 139 138  Potassium 3.3 - 4.7 mmol/L 4.0 3.9 3.4(L)  Chloride 98 - 109 mmol/L 102 - -  CO2 22 - 29 mmol/L '29 25 25  ' Calcium 8.4 - 10.4 mg/dL 9.1 9.2 9.0  Total Protein 6.4 - 8.3 g/dL 6.5 6.3(L) 6.3(L)  Total Bilirubin 0.2 - 1.2 mg/dL 0.3 0.31 0.34  Alkaline Phos 40 - 150 U/L 104 74 78  AST 5 - 34 U/L '18 14 23  ' ALT 0 - 55 U/L '14 8 16         ' RADIOGRAPHIC  STUDIES: I have personally reviewed the radiological images as listed and agreed with the findings in the report. No results found.   PET Lymphoma Hodgkins (with/ low dose CT) , 07/22/17 IMPRESSION: 1. Mildly hypermetabolic bilateral pulmonary airspace opacities that are favored to represent infection versus complications of stem cell transplant. Follow-up with diagnostic CT may be helpful to distinguish. 2. No FDG avid malignant disease at present. Ancillary CT findings as above.  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   PET/CT scan shows bulky hypermetabolic lymphadenopathy throughout the neck, chest, and abdomen. 1 cm hypermetabolic pelvic lymph node in right external iliac chain. Hypermetabolic masslike opacity in central right lung with multiple adjacent hypermetabolic nodules. This is suspicious for pulmonary involvement by lymphoma  Fatigue anorexia or night sweats-drenching, and weight loss suggest constitutional type B symptoms.  Hepatitis profile and HIV neg  Patient is s/p 5 cycles of CHOEP tolerated well overall and received CHOP for 6th cycle due to significant cytopenias after 5th cycle of CHOEP. PET/CT on 01/08/2017 - showed near completely resolved LNadenopathy.  She received her autologous stem cell transplant with Dr Duanne Moron at [facility] on 04/13/2017 which she has tolerated well and is recovering well and resolving cytopenias.  -PET scan on 07/22/2017 with results of: No FDG avid malignant disease at present.  Plan -Labs today (08/26/17) are stable with hgb 11.8: PLT are at 135k, WBC at 5.4k with no neutropenia. - -no fevers/chillis/infectious issues/bleeding or significant fatigue LDH at 200 today.  -no clinical , lab or radiographic evidence of disease recurrence at this time. -Advised the patient today to wait until after 180 s/p stem  cell transplant to have a dental cleaning, to which the patient agreed today.   -Will order a mammogram to be completed at  [facility] later in the year.  -She has Day 180 f/u with Dr Dellis Filbert in March, 2019 -I shall plan to see her back in 3 months with labs to monitor her progress. She is aware that she can call us if she has any additional questions or concerns.   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  #4 history of sleep apnea -continue CPAP use  #5 severe anxiety - this has improved significantly. -Has been using Xanax when necessary.  -Cymbalta  continue  #6 persistent dry cough likely due to airway irritation from mediastinal adenopathy.  Appears to have resolved with starting treatment.  -RTC with Dr Irene Limbo in 3 months with labs   All of the patients questions were answered with apparent satisfaction. The patient knows to call the clinic with any problems, questions or concerns.  I spent 15 minutes counseling the patient face to face. The total time spent in the appointment was 20 minutes and more than 50% was on counseling and direct patient care and co-ordination of cares.    Sullivan Lone MD [place] [facility]AHIVMS [facility] Hematology/Oncology Physician [place]  (Office):       [phone] (Work cell):  [phone] (Fax):           [phone]  This document serves as a record of services personally performed by Sullivan Lone, MD. It was created on his behalf by Steva Colder, a trained medical scribe. The creation of this record is based on the scribe's personal observations and the provider's statements to them.   .I have reviewed the above documentation for accuracy and completeness, and I agree with the above. Brunetta Genera MD MS

## 2017-08-26 NOTE — Telephone Encounter (Signed)
Scheduled appt per 1/17 los - Gave patient AVS and calender per los. Lab and f.u in 3 months.

## 2017-09-02 ENCOUNTER — Telehealth: Payer: Self-pay | Admitting: *Deleted

## 2017-09-02 ENCOUNTER — Other Ambulatory Visit: Payer: Self-pay | Admitting: Hematology

## 2017-09-02 ENCOUNTER — Other Ambulatory Visit: Payer: Self-pay | Admitting: *Deleted

## 2017-09-02 MED ORDER — AZITHROMYCIN 250 MG PO TABS: ORAL_TABLET | ORAL | 0 refills | Status: DC

## 2017-09-02 NOTE — Telephone Encounter (Signed)
Pt called c/o symptoms of sinus infection, headache, sinus pressure, yellow mucus.  Pt denies fever/chills, denies chest congestion.  Reviewed with Dr. Irene Limbo.  Z-pak sent to pt pharmacy.

## 2017-09-07 ENCOUNTER — Telehealth: Payer: Self-pay

## 2017-09-07 NOTE — Telephone Encounter (Signed)
Pt called during off hours on 1/23 complaining of a sinus infection and requesting antibiotics. Pt refused triage per note. Called pt and left VM requesting f/u on symptoms.

## 2017-09-07 NOTE — Telephone Encounter (Signed)
Pt left VM that she did receive antibiotics from Dr. Irene Limbo and finished the course prescribed. She is now feeling much better.

## 2017-09-08 ENCOUNTER — Other Ambulatory Visit: Payer: Self-pay | Admitting: Family Medicine

## 2017-09-09 NOTE — Telephone Encounter (Signed)
Is this okay to refill? 

## 2017-09-13 ENCOUNTER — Other Ambulatory Visit: Payer: Self-pay | Admitting: Family Medicine

## 2017-10-09 ENCOUNTER — Other Ambulatory Visit: Payer: Self-pay | Admitting: Family Medicine

## 2017-10-25 ENCOUNTER — Ambulatory Visit: Payer: BC Managed Care – PPO | Admitting: Nurse Practitioner

## 2017-11-07 ENCOUNTER — Other Ambulatory Visit: Payer: Self-pay | Admitting: Family Medicine

## 2017-11-08 NOTE — Telephone Encounter (Signed)
[facility] on [address]  Is requesting to fill pt mobic. Please advise. [place]

## 2017-11-22 ENCOUNTER — Other Ambulatory Visit: Payer: Self-pay | Admitting: Hematology

## 2017-11-22 ENCOUNTER — Other Ambulatory Visit: Payer: Self-pay | Admitting: Medical

## 2017-11-22 DIAGNOSIS — Z638 Other specified problems related to primary support group: Secondary | ICD-10-CM

## 2017-11-22 NOTE — Telephone Encounter (Signed)
Please advise, if okay to refill? Thanks!

## 2017-11-22 NOTE — Telephone Encounter (Signed)
Your patient 

## 2017-12-05 ENCOUNTER — Other Ambulatory Visit: Payer: Self-pay | Admitting: Family Medicine

## 2017-12-05 NOTE — Progress Notes (Signed)
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 The patient's last visit with [name] was on 08/26/17. The pt reports that she is doing well overall. The pt completed CHOEP followed by a preparative regimen for transplant pril 2018-04/12/17. Autologous transplant was 04/13/17.   The pt reports that she is feeling very well. She last saw Dr. Dellis Filbert on 10/26/17 at [facility] for her 180 day follow up; she has been set up for her routine vaccinations. She continues taking Acyclovir, Cymbalta, and her inhaler as needed. She has stopped Fluconazole and Bacttrim.  Lab results today (12/06/17) of CBC is as follows: all values are WNL. CMP 12/06/17 is WNL.  LDH 12/06/17 is WNL at 216   On review of systems, pt reports good energy levels, being emotionally well, and denies fevers, rashes, problems breathing, tingling and numbness in her hands in her feet, mouth sores, skin rashes, abdominal pains, leg swelling, and any other symptoms.   MEDICAL HISTORY:  Past Medical History:  Diagnosis Date  . Allergy   . Asthma   . Bursitis of left hip   . Fibroids    TAH in 1999  . GERD (gastroesophageal reflux disease)   . Lymphoma ([place])    T cell lymphoma  . PVC's (premature ventricular contractions)   . Sleep apnea    does not use CPAP    SURGICAL HISTORY: Past Surgical History:  Procedure Laterality Date  . CARDIAC CATH  05/2007   J. BERRY  . HAMMER TOE SURGERY Bilateral 2016  . IR GENERIC HISTORICAL  11/05/2016   IR US GUIDE VASC ACCESS RIGHT 11/05/2016 [name], MD [facility]  . IR GENERIC HISTORICAL  11/05/2016   IR FLUORO  GUIDE PORT INSERTION RIGHT 11/05/2016 [name], MD [facility]  . TOTAL ABDOMINAL HYSTERECTOMY  05/15/1998   secondary to fibroids  . WISDOM TOOTH EXTRACTION  mid teens    SOCIAL HISTORY: Social History   Socioeconomic History  . Marital status: Significant Other    Spouse name: Not on file  . Number of children: 0  . Years of education: Not on file  . Highest education level: Not on file  Occupational History  . 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[employer]: [employer]  . Financial resource strain: Not on file  . Food insecurity:    Worry: Not on file    Inability: Not on file  . Transportation needs:    Medical: Not on file    Non-medical: Not on file  Tobacco Use  . Smoking status: Never Smoker  . Smokeless tobacco: Never Used  Substance and Sexual Activity  . Alcohol use: No    Frequency: Never  . Drug use: No  . Sexual activity: Yes    Partners: Female    Birth control/protection: None  Lifestyle  . Physical activity:    Days per week: Not on file    Minutes per session: Not on file  . Stress: Not on file  Relationships  . Social connections:    Talks on phone: Not on file  Gets together: Not on file    Attends religious service: Not on file    Active member of club or organization: Not on file    Attends meetings of clubs or organizations: Not on file    Relationship status: Not on file  . Intimate partner violence:    Fear of current or ex partner: Not on file    Emotionally abused: Not on file    Physically abused: Not on file    Forced sexual activity: Not on file  Other Topics Concern  . Not on file  Social History Narrative  . Not on file    FAMILY HISTORY: Family History  Problem Relation Age of Onset  . Cancer Maternal Grandmother        GI cancer in 21's  . Dementia Mother   . Heart disease Father   . Heart failure Father     ALLERGIES:  is allergic to morphine and  related.  MEDICATIONS:  Current Outpatient Medications  Medication Sig Dispense Refill  . acyclovir (ZOVIRAX) 400 MG tablet TAKE 1 TABLET(400 MG) BY MOUTH TWICE DAILY 60 tablet 0  . ADVAIR DISKUS 100-50 MCG/DOSE AEPB Inhale 1 puff into the lungs daily as needed (sob and wheezing).     Marland Kitchen albuterol (PROVENTIL HFA;VENTOLIN HFA) 108 (90 Base) MCG/ACT inhaler Inhale 2 puffs into the lungs every 6 (six) hours as needed for wheezing or shortness of breath. 1 Inhaler 0  . ALPRAZolam (XANAX) 0.25 MG tablet TAKE 1 TABLET BY MOUTH TWICE DAILY AS NEEDED 50 tablet 0  . aspirin 81 MG chewable tablet Chew 81 mg by mouth daily as needed for moderate pain.     Marland Kitchen atenolol (TENORMIN) 50 MG tablet TAKE 1 TABLET BY MOUTH TWICE DAILY. 180 tablet 0  . DULoxetine (CYMBALTA) 30 MG capsule TAKE 1 CAPSULE(30 MG) BY MOUTH DAILY 30 capsule 0  . esomeprazole (NEXIUM) 40 MG capsule Take 1 capsule (40 mg total) by mouth daily before breakfast. (Patient taking differently: Take 40 mg by mouth daily as needed (indigestion). ) 100 capsule 0  . hydrochlorothiazide (MICROZIDE) 12.5 MG capsule TAKE 1 CAPSULE BY MOUTH EVERY DAY 30 capsule 0  . meloxicam (MOBIC) 15 MG tablet TAKE 1 TABLET BY MOUTH EVERY DAY AS NEEDED FOR MUSCLE SPASMS 30 tablet 1  . triazolam (HALCION) 0.25 MG tablet TAKE 1 TABLET BY MOUTH EVERY NIGHT AT BEDTIME AS NEEDED FOR SLEEP 90 tablet 1  . valACYclovir (VALTREX) 1000 MG tablet Take 1 tablet (1,000 mg total) by mouth 2 (two) times daily. 4 tablet 0   No current facility-administered medications for this visit.    Facility-Administered Medications Ordered in Other Visits  Medication Dose Route Frequency Provider Last Rate Last Dose  . sodium chloride flush (NS) 0.9 % injection 10 mL  10 mL Intracatheter PRN Denise Genera, MD   10 mL at 12/01/16 1731    REVIEW OF SYSTEMS:    10 Point review of Systems was done is negative except as noted above.   PHYSICAL EXAMINATION:  ECOG PERFORMANCE STATUS: 1 -  Symptomatic but completely ambulatory  . Vitals:   12/06/17 0920  BP: (!) 151/63  Pulse: 60  Resp: 17  Temp: 97.8 F (36.6 C)  SpO2: 100%   Filed Weights   12/06/17 0920  Weight: 176 lb 11.2 oz (80.2 kg)   .Body mass index is 30.33 kg/m.  GENERAL:alert, in no acute distress and comfortable SKIN: no acute rashes, no significant lesions EYES: conjunctiva are pink  and non-injected, sclera anicteric OROPHARYNX: MMM, no exudates, no oropharyngeal erythema or ulceration NECK: supple, no JVD LYMPH:  no palpable lymphadenopathy in the cervical, axillary or inguinal regions LUNGS: clear to auscultation b/l with normal respiratory effort HEART: regular rate & rhythm ABDOMEN:  normoactive bowel sounds , non tender, not distended. Extremity: no pedal edema PSYCH: alert & oriented x 3 with fluent speech NEURO: no focal motor/sensory deficits   LABORATORY DATA:  I have reviewed the data as listed  . CBC Latest Ref Rng & Units 12/06/2017 08/26/2017 06/28/2017  WBC 3.9 - 10.3 K/uL 6.0 5.4 4.3  Hemoglobin 11.6 - 15.9 g/dL 13.3 11.8 10.2(L)  Hematocrit 34.8 - 46.6 % 39.8 35.8 31.2(L)  Platelets 145 - 400 K/uL 151 135(L) 129(L)   . CMP Latest Ref Rng & Units 12/06/2017 08/26/2017 06/28/2017  Glucose 70 - 140 mg/dL 107 103 123  BUN 7 - 26 mg/dL 17 11 8.5  Creatinine 0.60 - 1.10 mg/dL 0.72 0.68 0.7  Sodium 136 - 145 mmol/L 139 140 139  Potassium 3.5 - 5.1 mmol/L 4.3 4.0 3.9  Chloride 98 - 109 mmol/L 103 102 -  CO2 22 - 29 mmol/L _0 Calcium 8.4 - 10.4 mg/dL 9.7 9.1 9.2  Total Protein 6.4 - 8.3 g/dL 6.8 6.5 6.3(L)  Total Bilirubin 0.2 - 1.2 mg/dL 0.5 0.3 0.31  Alkaline Phos 40 - 150 U/L 110 104 74  AST 5 - 34 U/L _1 ALT 0 - 55 U/L _2 RADIOGRAPHIC STUDIES: I have personally reviewed the radiological images as listed and agreed with the findings in the report. No results found.   PET Lymphoma Hodgkins (with/ low dose CT) , 07/22/17 IMPRESSION: 1.  Mildly hypermetabolic bilateral pulmonary airspace opacities that are favored to represent infection versus complications of stem cell transplant. Follow-up with diagnostic CT may be helpful to distinguish. 2. No FDG avid malignant disease at present. Ancillary CT findings as above.  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   PET/CT scan shows bulky hypermetabolic lymphadenopathy throughout the neck, chest, and abdomen. 1 cm hypermetabolic pelvic lymph node in right external iliac chain. Hypermetabolic masslike opacity in central right lung with multiple adjacent hypermetabolic nodules. This is suspicious for pulmonary involvement by lymphoma  Fatigue anorexia or night sweats-drenching, and weight loss suggest constitutional type B symptoms.  Hepatitis profile and HIV neg  Patient is s/p 5 cycles of CHOEP tolerated well overall and received CHOP for 6th cycle due to significant cytopenias after 5th cycle of CHOEP. PET/CT on 01/08/2017 - showed near completely resolved LNadenopathy.  She received her autologous stem cell transplant with Dr Duanne Moron at [facility] on 04/13/2017 which she has tolerated well and has recovered well with resolved cytopenias.  -PET scan on 07/22/2017 with results of: No FDG avid malignant disease at present.  Plan -patients labs cbc, cmp and LDH WNL and cytopenias have resolved. She os 200+ post transplant at this time. -Continue scans every 3-4 months for the first year, continue prophylactic anti-virals for the first year, and suggested vaccinations- she has been setup for this with Dr Duanne Moron. -Continue follow up with PCP -Discussed continued reasonable precautions for infection prevention including hand sanitizer use, and avoiding large crowds when possible.   -I am happy to continue alternating visits for  the pt with Dr. Dellis Filbert and  continue watching labs. -The pt shows no clinical, radiographic, or lab progression at this time.   -We will see pt back in 4 months with labs.    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  #4 history of sleep apnea -continue CPAP use  #5 severe anxiety - this has improved significantly. -Has been using Xanax when necessary.  -Okay to stop Cymbalta at this time with much improved anxiety and no issues with neuropathy at this time.   RTC with Labs in 4 months with Dr Irene Limbo    All of the patients questions were answered with apparent satisfaction. The patient knows to call the clinic with any problems, questions or concerns.  . The total time spent in the appointment was 25 minutes and more than 50% was on counseling and direct patient cares.    Denise Lone MD [place] AAHIVMS [facility] Hematology/Oncology [facility] [place]  (Office):       [phone] (Work cell):  [phone] (Fax):           [phone]  This document serves as a record of services personally performed by Denise Lone, MD. It was created on his behalf by Baldwin Jamaica, a trained medical scribe. The creation of this record is based on the scribe's personal observations and the provider's statements to them.   .I have reviewed the above documentation for accuracy and completeness, and I agree with the above. Denise Genera MD Denise

## 2017-12-06 ENCOUNTER — Telehealth: Payer: Self-pay | Admitting: Hematology

## 2017-12-06 ENCOUNTER — Inpatient Hospital Stay: Payer: BC Managed Care – PPO | Attending: Hematology | Admitting: Hematology

## 2017-12-06 ENCOUNTER — Encounter: Payer: Self-pay | Admitting: Hematology

## 2017-12-06 ENCOUNTER — Inpatient Hospital Stay: Payer: BC Managed Care – PPO

## 2017-12-06 VITALS — BP 151/63 | HR 60 | Temp 97.8°F | Resp 17 | Ht 64.0 in | Wt 176.7 lb

## 2017-12-06 DIAGNOSIS — C8478 Anaplastic large cell lymphoma, ALK-negative, lymph nodes of multiple sites: Secondary | ICD-10-CM

## 2017-12-06 DIAGNOSIS — C8440 Peripheral T-cell lymphoma, not classified, unspecified site: Secondary | ICD-10-CM | POA: Diagnosis not present

## 2017-12-06 DIAGNOSIS — G4730 Sleep apnea, unspecified: Secondary | ICD-10-CM | POA: Diagnosis not present

## 2017-12-06 DIAGNOSIS — F419 Anxiety disorder, unspecified: Secondary | ICD-10-CM

## 2017-12-06 DIAGNOSIS — D696 Thrombocytopenia, unspecified: Secondary | ICD-10-CM

## 2017-12-06 DIAGNOSIS — C8448 Peripheral T-cell lymphoma, not classified, lymph nodes of multiple sites: Secondary | ICD-10-CM

## 2017-12-06 LAB — CBC WITH DIFFERENTIAL (CANCER CENTER ONLY)
BASOS ABS: 0 10*3/uL (ref 0.0–0.1)
BASOS PCT: 1 %
EOS ABS: 0.1 10*3/uL (ref 0.0–0.5)
Eosinophils Relative: 2 %
HCT: 39.8 % (ref 34.8–46.6)
HEMOGLOBIN: 13.3 g/dL (ref 11.6–15.9)
LYMPHS ABS: 0.9 10*3/uL (ref 0.9–3.3)
Lymphocytes Relative: 14 %
MCH: 29.6 pg (ref 25.1–34.0)
MCHC: 33.5 g/dL (ref 31.5–36.0)
MCV: 88.6 fL (ref 79.5–101.0)
Monocytes Absolute: 0.5 10*3/uL (ref 0.1–0.9)
Monocytes Relative: 8 %
NEUTROS PCT: 75 %
Neutro Abs: 4.5 10*3/uL (ref 1.5–6.5)
Platelet Count: 151 10*3/uL (ref 145–400)
RBC: 4.49 MIL/uL (ref 3.70–5.45)
RDW: 13.9 % (ref 11.2–14.5)
WBC: 6 10*3/uL (ref 3.9–10.3)

## 2017-12-06 LAB — CMP (CANCER CENTER ONLY)
ALK PHOS: 110 U/L (ref 40–150)
ALT: 14 U/L (ref 0–55)
ANION GAP: 7 (ref 3–11)
AST: 18 U/L (ref 5–34)
Albumin: 4.1 g/dL (ref 3.5–5.0)
BUN: 17 mg/dL (ref 7–26)
CALCIUM: 9.7 mg/dL (ref 8.4–10.4)
CO2: 29 mmol/L (ref 22–29)
Chloride: 103 mmol/L (ref 98–109)
Creatinine: 0.72 mg/dL (ref 0.60–1.10)
GFR, Estimated: >60 mL/min (ref >60)
Glucose, Bld: 107 mg/dL (ref 70–140)
Potassium: 4.3 mmol/L (ref 3.5–5.1)
SODIUM: 139 mmol/L (ref 136–145)
Total Bilirubin: 0.5 mg/dL (ref 0.2–1.2)
Total Protein: 6.8 g/dL (ref 6.4–8.3)

## 2017-12-06 LAB — LACTATE DEHYDROGENASE: LDH: 216 U/L (ref 125–245)

## 2017-12-06 NOTE — Telephone Encounter (Signed)
[facility] is requesting to fill pt meloxicam. Please advise [facility] 

## 2017-12-06 NOTE — Telephone Encounter (Signed)
Scheduled appt per 4/29 los - gave pt avs and calender per los.

## 2017-12-09 ENCOUNTER — Telehealth: Payer: Self-pay | Admitting: Hematology

## 2017-12-09 NOTE — Telephone Encounter (Signed)
Left message for patient about some paperwork she needed filled out for [employer] insurance. Gave her our fax number to send in paperwork, so we can determine what she needs.

## 2017-12-12 ENCOUNTER — Other Ambulatory Visit: Payer: Self-pay | Admitting: Family Medicine

## 2017-12-13 ENCOUNTER — Telehealth: Payer: Self-pay

## 2017-12-13 NOTE — Telephone Encounter (Signed)
Pt was called to schedule a med check or CPE . No answer lvm  [place] 12-13-17

## 2017-12-15 ENCOUNTER — Telehealth: Payer: Self-pay

## 2017-12-15 MED ORDER — FLUTICASONE-SALMETEROL 100-50 MCG/DOSE IN AEPB: 1.0000 | INHALATION_SPRAY | Freq: Every day | RESPIRATORY_TRACT | 0 refills | Status: DC | PRN

## 2017-12-15 NOTE — Telephone Encounter (Signed)
Pt called and made an med check appt and is in need of Advair inhaler Please advise if this is ok to fill. [place]

## 2017-12-20 ENCOUNTER — Other Ambulatory Visit: Payer: Self-pay | Admitting: Hematology

## 2017-12-22 ENCOUNTER — Telehealth: Payer: Self-pay | Admitting: Family Medicine

## 2017-12-22 ENCOUNTER — Other Ambulatory Visit: Payer: Self-pay | Admitting: Family Medicine

## 2017-12-22 NOTE — Telephone Encounter (Signed)
ok 

## 2017-12-22 NOTE — Telephone Encounter (Signed)
Called pt pharmacy and her insurance does not cover the generic advair. Pt says she will keep the advair. [place]

## 2017-12-22 NOTE — Telephone Encounter (Signed)
[facility] is requesting to fill pt advair. Please advise [facility]

## 2017-12-22 NOTE — Telephone Encounter (Signed)
Pt called and is wanting to switch from her  advair dikus to generic   [facility]) she pays 60 dollars a month for the advair so she want to see if this is cheaper, she uses [facility] [place] [place] [address] [place] pt can be reached at [phone] and pt has appt in June in [facility] informed pt that you was out of the office today

## 2018-01-12 ENCOUNTER — Other Ambulatory Visit: Payer: Self-pay | Admitting: Family Medicine

## 2018-01-13 IMAGING — US US BIOPSY
1 series · 9 of 9 positions shown · non-contrast
Comparison: none

CLINICAL DATA: Diffuse lymphadenopathy in the lower neck, chest and
upper abdomen by prior CT.

[Series 1: us biopsy · 0.06mm/px · 9 of 9 slices shown]
[im 1/9]
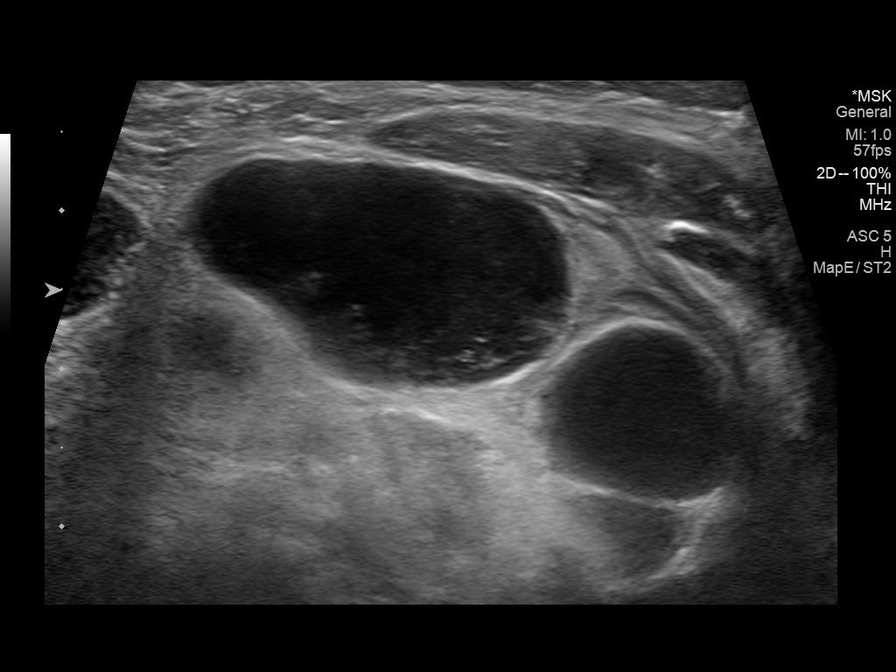
[im 2/9]
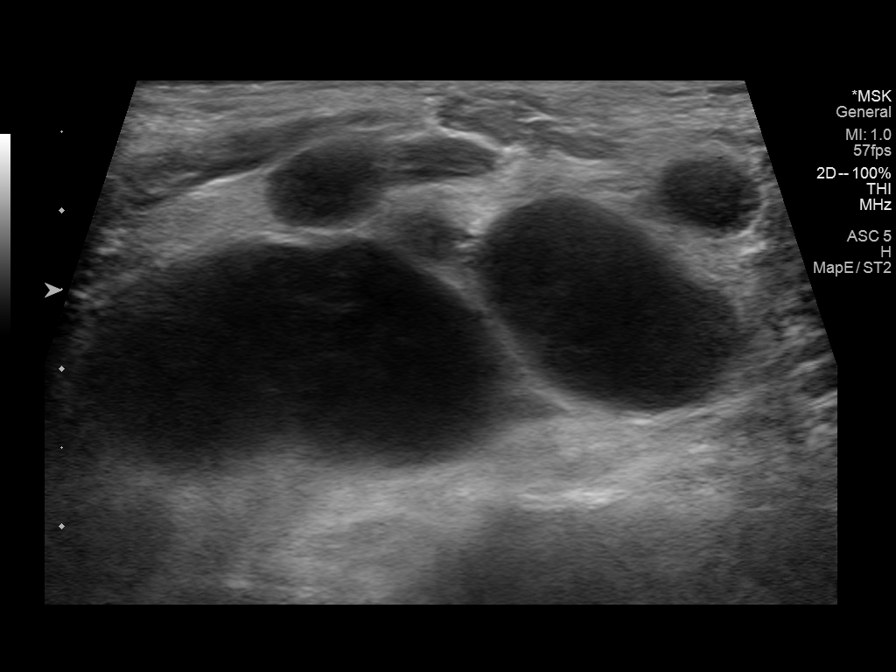
[im 3/9]
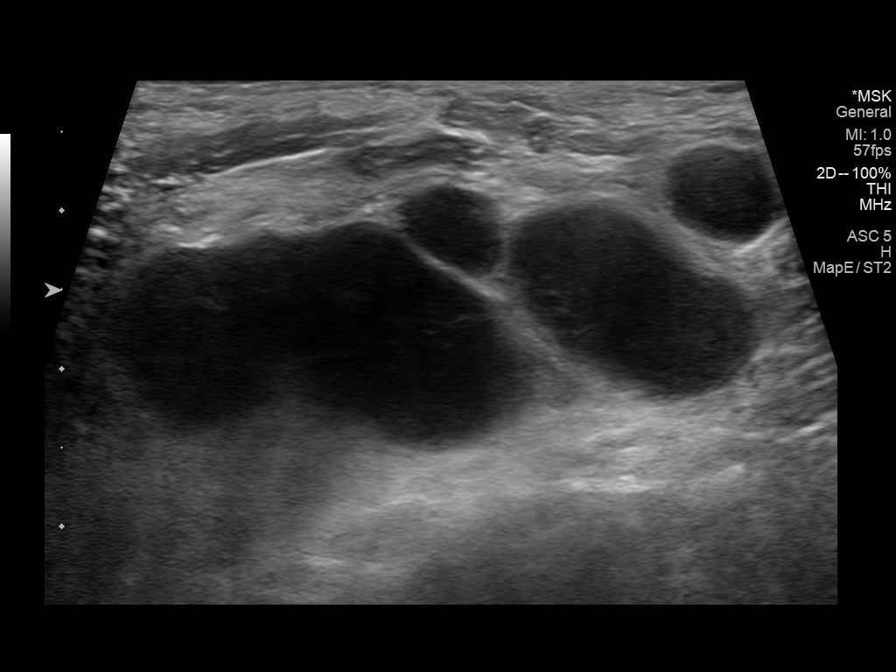
[im 4/9]
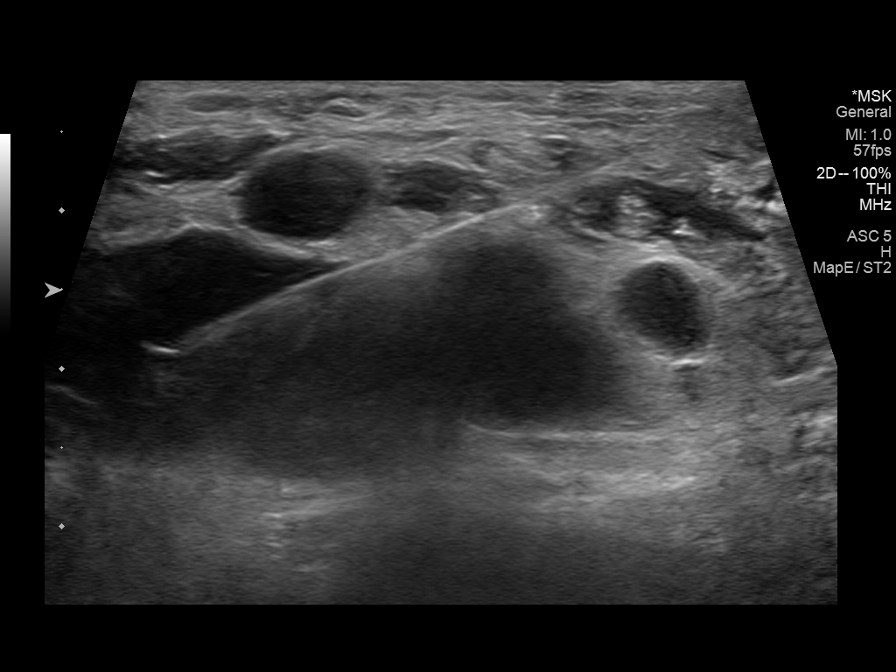
[im 5/9]
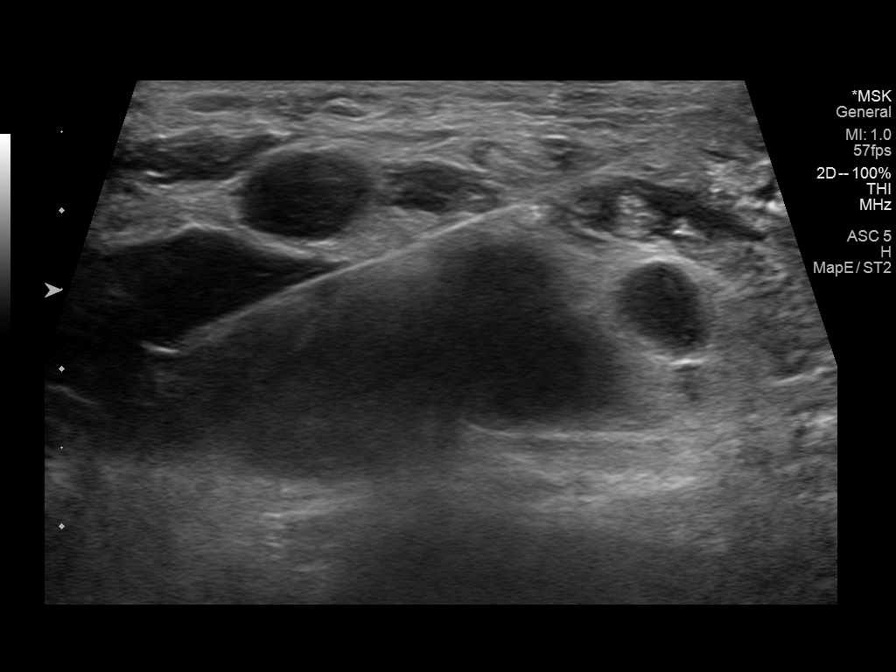
[im 6/9]
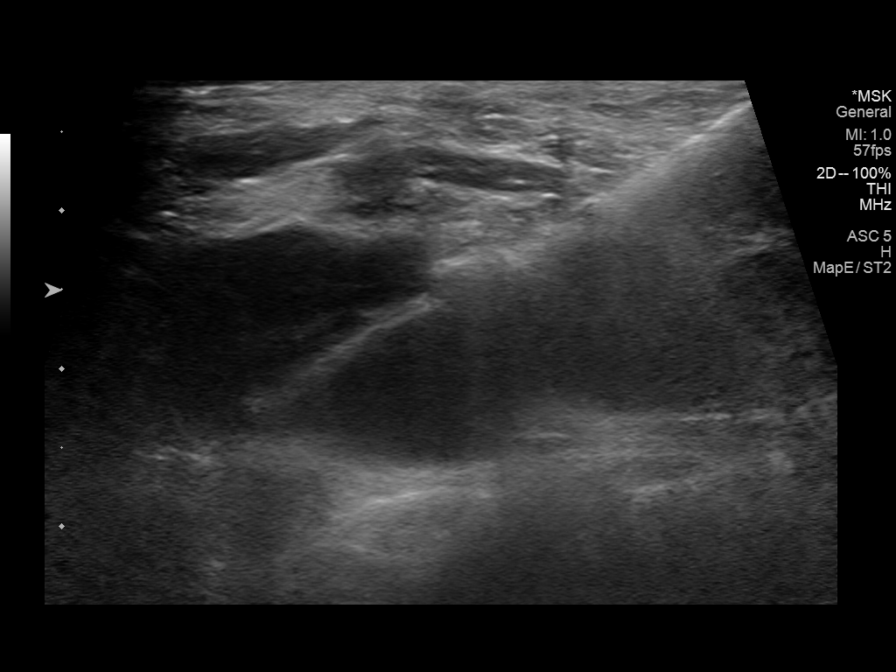
[im 7/9]
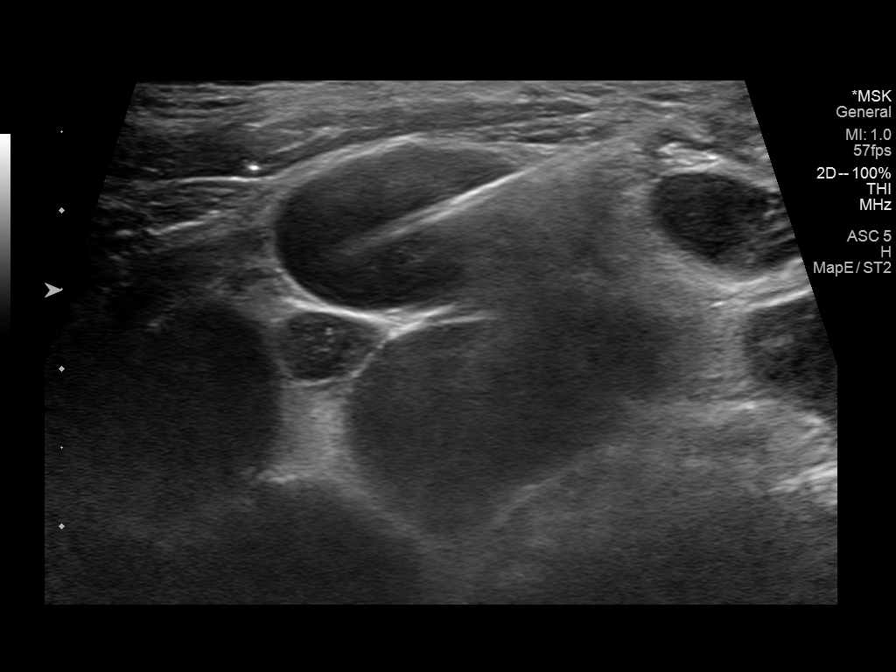
[im 8/9]
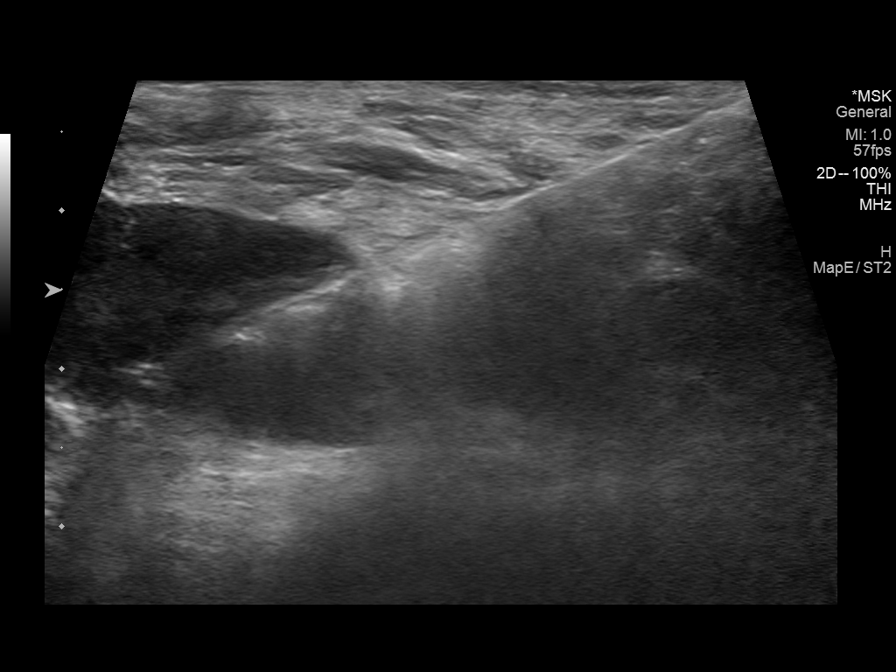
[im 9/9]
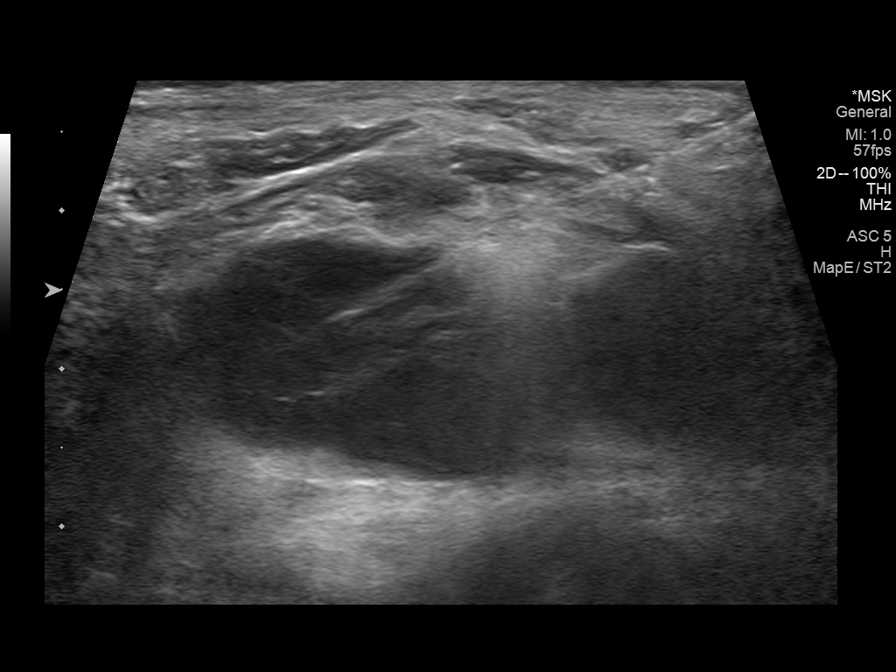

[9 of 9 positions shown; findings below may reference images not displayed]

EXAM:
ULTRASOUND GUIDED CORE BIOPSY OF LEFT SUPRACLAVICULAR LYMPH NODE

MEDICATIONS:
3.0 mg IV Versed; 100 mcg IV Fentanyl

Total Moderate Sedation Time: 13 minutes.

The patient's level of consciousness and physiologic status were
continuously monitored during the procedure by Radiology nursing.

PROCEDURE:
The procedure, risks, benefits, and alternatives were explained to
the patient. Questions regarding the procedure were encouraged and
answered. The patient understands and consents to the procedure. A
time out was performed prior to initiating the procedure.

Ultrasound was performed of both sides of the neck. The left neck
was prepped with chlorhexidine in a sterile fashion, and a sterile
drape was applied covering the operative field. A sterile gown and
sterile gloves were used for the procedure. Local anesthesia was
provided with 1% Lidocaine.

Under direct ultrasound guidance, a total of 5 separate 16 gauge
core biopsy samples were obtained of 2 adjacent lymph nodes in the
left supraclavicular region. Core biopsy samples were submitted in
saline.

COMPLICATIONS:
None.
FINDINGS: There are a multitude of enlarged lymph nodes in both sides of the
neck. The largest cluster of enlarged nodes was in the left
supraclavicular region. Core biopsy samples yielded solid tissue.
IMPRESSION: Ultrasound-guided core biopsy performed of left supraclavicular
lymphadenopathy.

## 2018-01-17 ENCOUNTER — Other Ambulatory Visit: Payer: Self-pay | Admitting: Family Medicine

## 2018-01-17 NOTE — Telephone Encounter (Signed)
[facility] is requesting to fill pt WIXELA  Inhaler. Please advise [facility]

## 2018-02-02 ENCOUNTER — Encounter: Payer: Self-pay | Admitting: Family Medicine

## 2018-02-09 ENCOUNTER — Ambulatory Visit: Payer: BC Managed Care – PPO | Admitting: Family Medicine

## 2018-02-09 ENCOUNTER — Encounter: Payer: Self-pay | Admitting: Family Medicine

## 2018-02-09 VITALS — BP 114/68 | HR 59 | Temp 97.8°F | Wt 180.8 lb

## 2018-02-09 DIAGNOSIS — K219 Gastro-esophageal reflux disease without esophagitis: Secondary | ICD-10-CM

## 2018-02-09 DIAGNOSIS — I493 Ventricular premature depolarization: Secondary | ICD-10-CM

## 2018-02-09 DIAGNOSIS — C8448 Peripheral T-cell lymphoma, not classified, lymph nodes of multiple sites: Secondary | ICD-10-CM | POA: Diagnosis not present

## 2018-02-09 DIAGNOSIS — J4530 Mild persistent asthma, uncomplicated: Secondary | ICD-10-CM

## 2018-02-09 DIAGNOSIS — R7309 Other abnormal glucose: Secondary | ICD-10-CM | POA: Diagnosis not present

## 2018-02-09 DIAGNOSIS — J301 Allergic rhinitis due to pollen: Secondary | ICD-10-CM | POA: Diagnosis not present

## 2018-02-09 DIAGNOSIS — R739 Hyperglycemia, unspecified: Secondary | ICD-10-CM | POA: Diagnosis not present

## 2018-02-09 DIAGNOSIS — E669 Obesity, unspecified: Secondary | ICD-10-CM

## 2018-02-09 DIAGNOSIS — Z8249 Family history of ischemic heart disease and other diseases of the circulatory system: Secondary | ICD-10-CM

## 2018-02-09 LAB — POCT GLYCOSYLATED HEMOGLOBIN (HGB A1C): Hemoglobin A1C: 6.1 % — AB (ref 4.0–5.6)

## 2018-02-09 LAB — HM MAMMOGRAPHY

## 2018-02-09 NOTE — Progress Notes (Addendum)
   Subjective:    Patient ID: Denise [name], female    DOB: 08-May-1956, 62 y.o.   MRN: [id number]  HPI She is here for an interval evaluation.  She does have underlying lymphoma and is followed both at [facility] and here in [place].  She was seen in [facility] June 6.  The lab work at that time was reviewed.  It did show slightly elevated blood sugar.  She does have allergies which seem to be under good control.  She continues on her asthma medication and rarely uses her albuterol.  Her weight is relatively stable.  She continues on Cymbalta and plans to stop sometime later this summer early fall.  She rarely uses Halcion or Xanax.  She continues on atenolol for treatment of her PVCs.  She does also use HCTZ.  Her reflux causes very little difficulty.  Family history was reviewed. She has a trip planned to [place] this fall and get married sometime later in the year.  Review of Systems     Objective:   Physical Exam Alert and in no distress. Tympanic membranes and canals are normal. Pharyngeal area is normal. Neck is supple without adenopathy or thyromegaly. Cardiac exam shows a regular sinus rhythm without murmurs or gallops. Lungs are clear to auscultation.  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  Her meds do not need to be renewed.  She will check with her oncologist concerning getting the shingles vaccine as well as pneumonia shots. I also explained to her that her A1c reading was 6.1  which puts her in the glucose intolerance category.

## 2018-02-14 ENCOUNTER — Telehealth: Payer: Self-pay

## 2018-02-14 NOTE — Telephone Encounter (Signed)
Pt wants to know if her diabetes diagnosis can be changed now that she is off chemo. She stated diabetes was in relation to chemo. She also stated if you don't agree with her she will come and steal your socks! Haha

## 2018-02-14 NOTE — Telephone Encounter (Signed)
Let her know that it is not on her problem list.  The next time she comes and we can also re check that

## 2018-02-14 NOTE — Telephone Encounter (Signed)
Called pt and lVM about diabetes and advised it will be dicussed at the next appt. [place]

## 2018-02-15 NOTE — Progress Notes (Signed)
other

## 2018-02-23 ENCOUNTER — Other Ambulatory Visit: Payer: Self-pay | Admitting: Family Medicine

## 2018-03-20 ENCOUNTER — Other Ambulatory Visit: Payer: Self-pay | Admitting: Hematology

## 2018-04-06 ENCOUNTER — Other Ambulatory Visit: Payer: Self-pay | Admitting: Family Medicine

## 2018-04-06 ENCOUNTER — Other Ambulatory Visit: Payer: BC Managed Care – PPO

## 2018-04-06 ENCOUNTER — Ambulatory Visit: Payer: BC Managed Care – PPO | Admitting: Hematology

## 2018-04-06 DIAGNOSIS — Z638 Other specified problems related to primary support group: Secondary | ICD-10-CM

## 2018-04-06 NOTE — Telephone Encounter (Signed)
[facility] is requesting to fill pt triazolam and xanax. Please advise [facility]

## 2018-04-12 IMAGING — CR DG CHEST 2V
2 series · 2 of 2 positions shown · non-contrast
Comparison: 10/15/2016

CLINICAL DATA: Lymphoma, chest pain

EXAM:
CHEST  2 VIEW

[w chest pa]
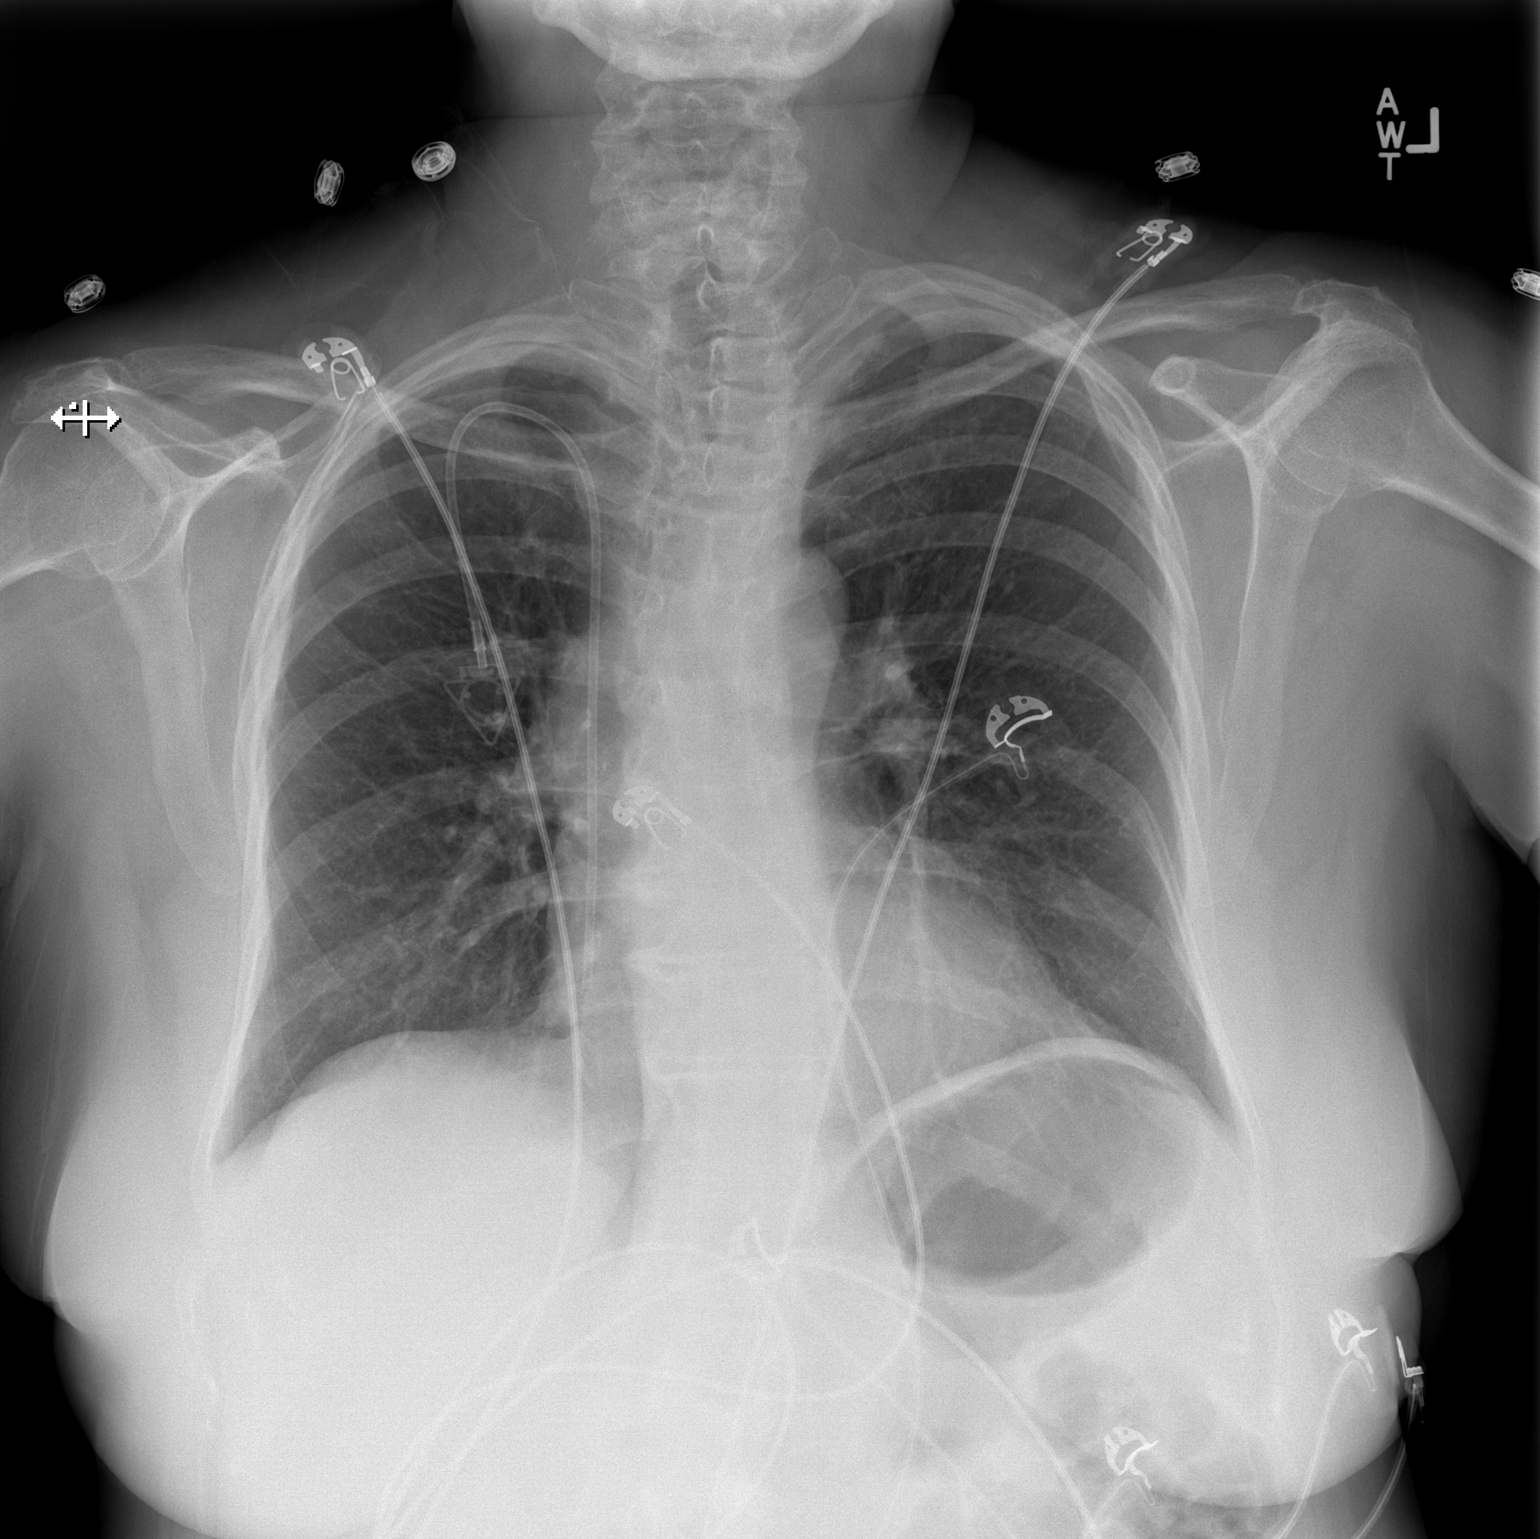

[w chest lat]
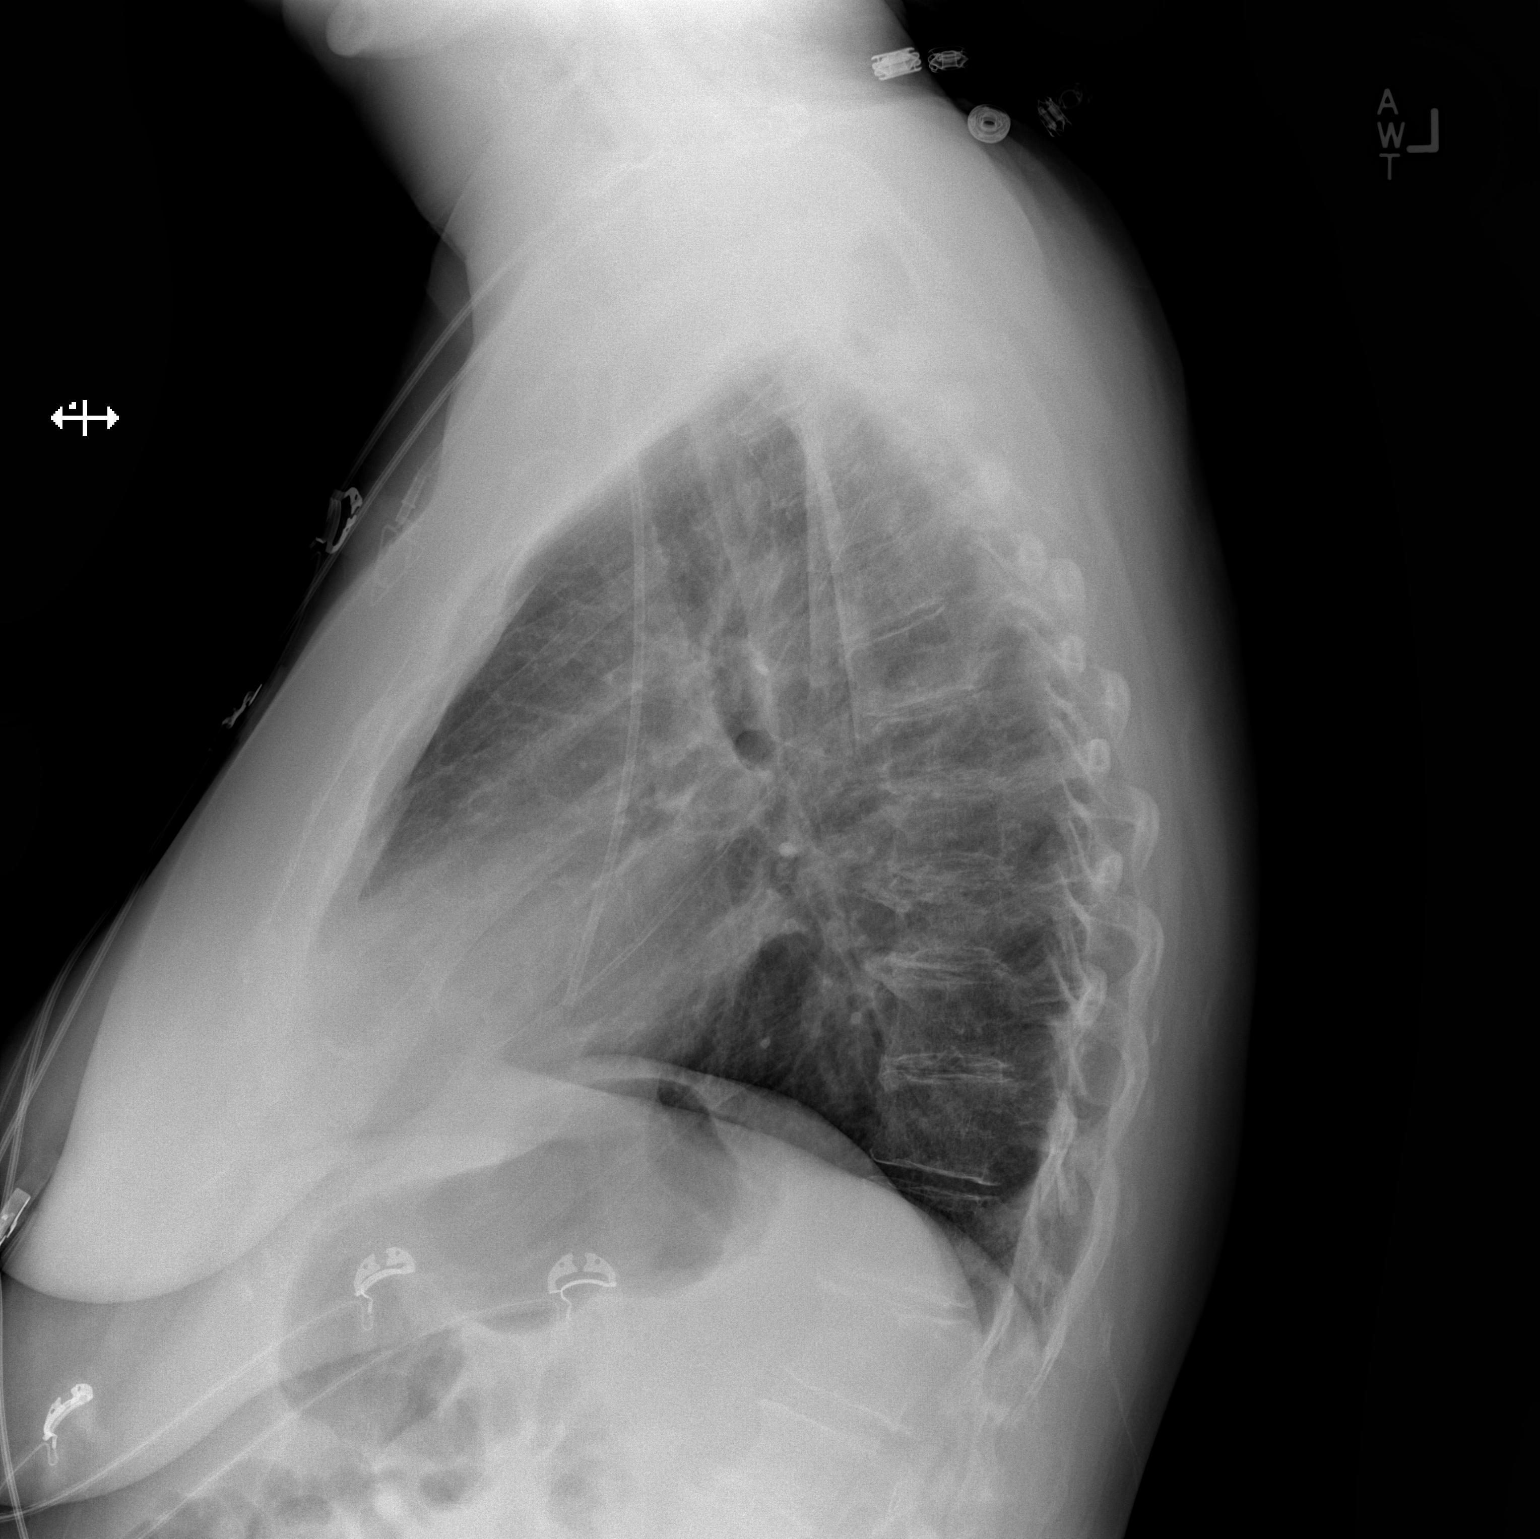

[2 of 2 positions shown; findings below may reference images not displayed]

FINDINGS: Heart size and vascularity normal. Negative for pneumonia. Negative
for mass or effusion. Interval improvement in mediastinal and hilar
adenopathy compared with the prior study. Port-A-Cath tip in the mid
right atrium.
IMPRESSION: No active cardiopulmonary disease.

## 2018-04-21 ENCOUNTER — Other Ambulatory Visit: Payer: Self-pay | Admitting: Nurse Practitioner

## 2018-05-10 ENCOUNTER — Ambulatory Visit: Payer: BC Managed Care – PPO | Admitting: Family Medicine

## 2018-05-10 ENCOUNTER — Encounter: Payer: Self-pay | Admitting: Family Medicine

## 2018-05-10 VITALS — BP 122/82 | HR 76 | Temp 98.1°F | Wt 183.4 lb

## 2018-05-10 DIAGNOSIS — C8448 Peripheral T-cell lymphoma, not classified, lymph nodes of multiple sites: Secondary | ICD-10-CM

## 2018-05-10 DIAGNOSIS — J209 Acute bronchitis, unspecified: Secondary | ICD-10-CM

## 2018-05-10 MED ORDER — AMOXICILLIN 875 MG PO TABS: 875.0000 mg | ORAL_TABLET | Freq: Two times a day (BID) | ORAL | 0 refills | Status: DC

## 2018-05-10 NOTE — Progress Notes (Signed)
   Subjective:    Patient ID: Denise [name], female    DOB: 06-Feb-1956, 62 y.o.   MRN: [id number]  HPI She complains of a 2-week history of started with cough, PND, rhinorrhea and sore throat.  Cough is become intermittently productive.  No earache, fever, chills.  She does not smoke.  She does have spring and fall allergies.  She continues to be followed for her underlying lymphoma and subsequent bone marrow transplant. She also would like to stop the Cymbalta.  This was given to her to help her deal with the lymphoma.   Review of Systems     Objective:   Physical Exam Alert and in no distress. Tympanic membranes and canals are normal. Pharyngeal area is normal. Neck is supple without adenopathy or thyromegaly. Cardiac exam shows a regular sinus rhythm without murmurs or gallops. Lungs are clear to auscultation.       Assessment & Plan:  Acute bronchitis, unspecified organism - Plan: amoxicillin (AMOXIL) 875 MG tablet She is to call me if not entirely better when she finishes the antibiotic. She will stop the Cymbalta.  Explained that she might note more mood swings and if she has difficulties, she will call me.

## 2018-05-18 ENCOUNTER — Inpatient Hospital Stay: Payer: BC Managed Care – PPO | Attending: Hematology

## 2018-05-18 ENCOUNTER — Encounter: Payer: Self-pay | Admitting: Hematology

## 2018-05-18 ENCOUNTER — Inpatient Hospital Stay (HOSPITAL_BASED_OUTPATIENT_CLINIC_OR_DEPARTMENT_OTHER): Payer: BC Managed Care – PPO | Admitting: Hematology

## 2018-05-18 ENCOUNTER — Telehealth: Payer: Self-pay | Admitting: Hematology

## 2018-05-18 VITALS — BP 162/68 | HR 57 | Temp 98.4°F | Resp 14 | Ht 64.0 in | Wt 188.8 lb

## 2018-05-18 DIAGNOSIS — F419 Anxiety disorder, unspecified: Secondary | ICD-10-CM | POA: Diagnosis not present

## 2018-05-18 DIAGNOSIS — Z8572 Personal history of non-Hodgkin lymphomas: Secondary | ICD-10-CM

## 2018-05-18 DIAGNOSIS — C8478 Anaplastic large cell lymphoma, ALK-negative, lymph nodes of multiple sites: Secondary | ICD-10-CM

## 2018-05-18 LAB — CBC WITH DIFFERENTIAL (CANCER CENTER ONLY)
Abs Immature Granulocytes: 0.02 10*3/uL (ref 0.00–0.07)
BASOS ABS: 0 10*3/uL (ref 0.0–0.1)
BASOS PCT: 0 %
EOS ABS: 0.1 10*3/uL (ref 0.0–0.5)
EOS PCT: 2 %
HCT: 37 % (ref 36.0–46.0)
HEMOGLOBIN: 12.4 g/dL (ref 12.0–15.0)
Immature Granulocytes: 0 %
LYMPHS PCT: 25 %
Lymphs Abs: 1.4 10*3/uL (ref 0.7–4.0)
MCH: 29.8 pg (ref 26.0–34.0)
MCHC: 33.5 g/dL (ref 30.0–36.0)
MCV: 88.9 fL (ref 80.0–100.0)
MONO ABS: 0.5 10*3/uL (ref 0.1–1.0)
Monocytes Relative: 8 %
Neutro Abs: 3.6 10*3/uL (ref 1.7–7.7)
Neutrophils Relative %: 65 %
PLATELETS: 155 10*3/uL (ref 150–400)
RBC: 4.16 MIL/uL (ref 3.87–5.11)
RDW: 12.3 % (ref 11.5–15.5)
WBC: 5.6 10*3/uL (ref 4.0–10.5)
nRBC: 0 % (ref 0.0–0.2)

## 2018-05-18 LAB — CMP (CANCER CENTER ONLY)
ALT: 15 U/L (ref 0–44)
AST: 18 U/L (ref 15–41)
Albumin: 3.6 g/dL (ref 3.5–5.0)
Alkaline Phosphatase: 120 U/L (ref 38–126)
Anion gap: 8 (ref 5–15)
BUN: 15 mg/dL (ref 8–23)
CHLORIDE: 105 mmol/L (ref 98–111)
CO2: 27 mmol/L (ref 22–32)
CREATININE: 0.73 mg/dL (ref 0.44–1.00)
Calcium: 8.9 mg/dL (ref 8.9–10.3)
Glucose, Bld: 106 mg/dL — ABNORMAL HIGH (ref 70–99)
Potassium: 4.8 mmol/L (ref 3.5–5.1)
Sodium: 140 mmol/L (ref 135–145)
TOTAL PROTEIN: 6.4 g/dL — AB (ref 6.5–8.1)
Total Bilirubin: 0.3 mg/dL (ref 0.3–1.2)

## 2018-05-18 LAB — RETICULOCYTES
Immature Retic Fract: 11.1 % (ref 2.3–15.9)
RBC.: 4.16 MIL/uL (ref 3.87–5.11)
RETIC CT PCT: 1.6 % (ref 0.4–3.1)
Retic Count, Absolute: 67 10*3/uL (ref 19.0–186.0)

## 2018-05-18 LAB — LACTATE DEHYDROGENASE: LDH: 224 U/L — AB (ref 98–192)

## 2018-05-18 NOTE — Progress Notes (Signed)
Marland Kitchen    HEMATOLOGY/ONCOLOGY CLINIC NOTE  Date of Service: 05/18/18     Patient Care Team: Denita Lung, MD as PCP - General (Family Medicine) Dr. 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 The pt completed CHOEP followed by a preparative regimen for transplant pril 2018-04/12/17. Autologous transplant was 04/13/17. The patient's last visit with [name] was on 12/06/17. She is accompanied today by her partner. The pt reports that she is doing well overall and recently returned from a fantastic trip to [place].   The pt reports that she is currently on an antibiotic course for her bronchitis which is resolving. She notes that she has been keeping very active and notes that overall she has much improved energy levels. She continues eating well and enjoying life.   The pt has stopped Acyclovir under the management of her transplant team at [facility] and last saw them on 04/14/18. She has also stopped Cymbalta. She is also now under the care of Dr. Shannan Harper at [facility].   Of note since the patient's last visit, pt has had CT C/A/P and Neck completed on 04/14/18 with results revealing No evidence suggest recurrent disease in the chest, abdomen, or pelvis and No abnormal lymph nodes or masses are identified in the neck.   Lab results today (05/18/18) of CBC w/diff, CMP, and Reticulocytes is as follows: all values are WNL except for Glucose at 106, Total Protein at 6.4. 05/18/18 LDH is p. Lab Results  Component Value Date   LDH 224 (H) 05/18/2018    On review of systems, pt reports good energy levels, staying active, resolving bronchitis, eating well, and denies mouth sores, back pains, abdominal pains,  leg swelling, and any other symptoms.     MEDICAL HISTORY:  Past Medical History:  Diagnosis Date  . Allergy   . Asthma   . Bursitis of left hip   . Fibroids    TAH in 1999  . GERD (gastroesophageal reflux disease)   . Lymphoma ([place])    T cell lymphoma  . PVC's (premature ventricular contractions)   . Sleep apnea    does not use CPAP    SURGICAL HISTORY: Past Surgical History:  Procedure Laterality Date  . CARDIAC CATH  05/2007   J. BERRY  . HAMMER TOE SURGERY Bilateral 2016  . IR GENERIC HISTORICAL  11/05/2016   IR US GUIDE VASC ACCESS RIGHT 11/05/2016 [name], MD [facility]INTER[facility]  . IR GENERIC HISTORICAL  11/05/2016   IR FLUORO GUIDE PORT INSERTION RIGHT 11/05/2016 [name], MD [facility]  . TOTAL ABDOMINAL HYSTERECTOMY  05/15/1998   secondary to fibroids  . WISDOM TOOTH EXTRACTION  mid teens    SOCIAL HISTORY: Social History   Socioeconomic History  . Marital status: Significant Other    Spouse name: Not on file  . Number of children: 0  . Years of education: Not on file  . Highest education level: Not on file  Occupational History  . 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[employer]: [employer]  . Financial resource strain: Not on file  . Food insecurity:    Worry: Not on file  Inability: Not on file  . Transportation needs:    Medical: Not on file    Non-medical: Not on file  Tobacco Use  . Smoking status: Never Smoker  . Smokeless tobacco: Never Used  Substance and Sexual Activity  . Alcohol use: No    Frequency: Never  . Drug use: No  . Sexual activity: Yes    Partners: Female    Birth control/protection: None  Lifestyle  . Physical activity:    Days per week: Not on file    Minutes per session: Not on file  . Stress: Not on file  Relationships  . Social connections:    Talks on phone: Not on file    Gets together: Not on file    Attends religious service: Not on file    Active member of  club or organization: Not on file    Attends meetings of clubs or organizations: Not on file    Relationship status: Not on file  . Intimate partner violence:    Fear of current or ex partner: Not on file    Emotionally abused: Not on file    Physically abused: Not on file    Forced sexual activity: Not on file  Other Topics Concern  . Not on file  Social History Narrative  . Not on file    FAMILY HISTORY: Family History  Problem Relation Age of Onset  . Cancer Maternal Grandmother        GI cancer in 1's  . Dementia Mother   . Heart disease Father   . Heart failure Father     ALLERGIES:  is allergic to morphine and related.  MEDICATIONS:  Current Outpatient Medications  Medication Sig Dispense Refill  . ADVAIR DISKUS 100-50 MCG/DOSE AEPB INHALE 1 PUFF INTO THE LUNGS DAILY AS NEEDED FOR SHORTNESS OF BREATH OR WHEEZING 3 each 3  . albuterol (PROVENTIL HFA;VENTOLIN HFA) 108 (90 Base) MCG/ACT inhaler Inhale 2 puffs into the lungs every 6 (six) hours as needed for wheezing or shortness of breath. 1 Inhaler 0  . ALPRAZolam (XANAX) 0.25 MG tablet TAKE 1 TABLET BY MOUTH TWICE DAILY AS NEEDED 50 tablet 0  . aspirin 81 MG chewable tablet Chew 81 mg by mouth daily as needed for moderate pain.     Marland Kitchen atenolol (TENORMIN) 50 MG tablet TAKE 1 TABLET BY MOUTH TWICE DAILY. 180 tablet 1  . esomeprazole (NEXIUM) 40 MG capsule Take 1 capsule (40 mg total) by mouth daily before breakfast. (Patient taking differently: Take 40 mg by mouth daily as needed (indigestion). ) 100 capsule 0  . Fluticasone-Salmeterol (WIXELA INHUB) 100-50 MCG/DOSE AEPB Inhale 1 puff into the lungs 2 (two) times daily. (Patient not taking: Reported on 05/18/2018) 1 each 5  . triazolam (HALCION) 0.25 MG tablet TAKE 1 TABLET BY MOUTH EVERY NIGHT AT BEDTIME AS NEEDED FOR SLEEP 90 tablet 0   No current facility-administered medications for this visit.     REVIEW OF SYSTEMS:    A 10+ POINT REVIEW OF SYSTEMS WAS OBTAINED  including neurology, dermatology, psychiatry, cardiac, respiratory, lymph, extremities, GI, GU, Musculoskeletal, constitutional, breasts, reproductive, HEENT.  All pertinent positives are noted in the HPI.  All others are negative.   PHYSICAL EXAMINATION:  ECOG PERFORMANCE STATUS: 1 - Symptomatic but completely ambulatory  . Vitals:   05/18/18 0839  BP: (!) 162/68  Pulse: (!) 57  Resp: 14  Temp: 98.4 F (36.9 C)  SpO2: 100%   Filed Weights   05/18/18 0839  Weight: 188 lb 12.8 oz (85.6 kg)   .Body mass index is 32.41 kg/m.  GENERAL:alert, in no acute distress and comfortable SKIN: no acute rashes, no significant lesions EYES: conjunctiva are pink and non-injected, sclera anicteric OROPHARYNX: MMM, no exudates, no oropharyngeal erythema or ulceration NECK: supple, no JVD LYMPH:  no palpable lymphadenopathy in the cervical, axillary or inguinal regions LUNGS: clear to auscultation b/l with normal respiratory effort HEART: regular rate & rhythm ABDOMEN:  normoactive bowel sounds , non tender, not distended. No palpable hepatosplenomegaly.  Extremity: no pedal edema PSYCH: alert & oriented x 3 with fluent speech NEURO: no focal motor/sensory deficits   LABORATORY DATA:  I have reviewed the data as listed  . CBC Latest Ref Rng & Units 05/18/2018 12/06/2017 08/26/2017  WBC 4.0 - 10.5 K/uL 5.6 6.0 5.4  Hemoglobin 12.0 - 15.0 g/dL 12.4 13.3 11.8  Hematocrit 36.0 - 46.0 % 37.0 39.8 35.8  Platelets 150 - 400 K/uL 155 151 135(L)   . CMP Latest Ref Rng & Units 05/18/2018 12/06/2017 08/26/2017  Glucose 70 - 99 mg/dL 106(H) 107 103  BUN 8 - 23 mg/dL '15 17 11  ' Creatinine 0.44 - 1.00 mg/dL 0.73 0.72 0.68  Sodium 135 - 145 mmol/L 140 139 140  Potassium 3.5 - 5.1 mmol/L 4.8 4.3 4.0  Chloride 98 - 111 mmol/L 105 103 102  CO2 22 - 32 mmol/L '27 29 29  ' Calcium 8.9 - 10.3 mg/dL 8.9 9.7 9.1  Total Protein 6.5 - 8.1 g/dL 6.4(L) 6.8 6.5  Total Bilirubin 0.3 - 1.2 mg/dL 0.3 0.5 0.3  Alkaline  Phos 38 - 126 U/L 120 110 104  AST 15 - 41 U/L '18 18 18  ' ALT 0 - 44 U/L '15 14 14         ' RADIOGRAPHIC STUDIES: I have personally reviewed the radiological images as listed and agreed with the findings in the report. No results found.   PET Lymphoma Hodgkins (with/ low dose CT) , 07/22/17 IMPRESSION: 1. Mildly hypermetabolic bilateral pulmonary airspace opacities that are favored to represent infection versus complications of stem cell transplant. Follow-up with diagnostic CT may be helpful to distinguish. 2. No FDG avid malignant disease at present. Ancillary CT findings as above.  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   PET/CT scan shows bulky hypermetabolic lymphadenopathy throughout the neck, chest, and abdomen. 1 cm hypermetabolic pelvic lymph node in right external iliac chain. Hypermetabolic masslike opacity in central right lung with multiple adjacent hypermetabolic nodules. This is suspicious for pulmonary involvement by lymphoma  Fatigue anorexia or night sweats-drenching, and weight loss suggest constitutional type B symptoms.  Hepatitis profile and HIV neg  Patient is s/p 5 cycles of CHOEP tolerated well overall and received CHOP for 6th cycle due to significant cytopenias after 5th cycle of CHOEP. PET/CT on 01/08/2017 - showed near completely resolved LNadenopathy.  She received her autologous stem cell transplant with Dr Duanne Moron at [facility] on 04/13/2017 which she has tolerated well and has recovered well with resolved cytopenias.  -PET scan on 07/22/2017 with results of: No FDG avid malignant disease at present.  PLAN:  -Pt completed prophylactic anti-virals for the first year and suggested vaccinations with Dr. Duanne Moron at [facility] -I am happy to continue alternating visits for the pt with  Dr.  Jolayne Haines and continue watching labs. -Discussed pt labwork today, 05/18/18; blood counts are normal, chemistries are stable.  -Discussed the 04/14/18 CT C/A/P and Neck results which revealed No evidence of disease return of progression  -Advised that the pt receive her annual flu shot -The pt shows no clinical, radiographic, or lab return or progression of T-cell lymphoma at this time.  -No indication for further treatment at this time.   -Recommended that the pt continue to eat well, drink at least 48-64 oz of water each day, and walk 20-30 minutes each day. -Will continue to monitor pt every 3 months as she is now more than a year post 04/13/17 transplant    #3 history of PVCs -On chronic beta blocker therapy.  #4 history of sleep apnea -continue CPAP use  #5 severe anxiety - this has improved significantly. -Has been using Xanax when necessary.  -Okay to stop Cymbalta at this time with much improved anxiety and no issues with neuropathy at this time.    RTC with Dr Irene Limbo in 3 months with labs   All of the patients questions were answered with apparent satisfaction. The patient knows to call the clinic with any problems, questions or concerns.  The total time spent in the appt was 30 minutes and more than 50% was on counseling and direct patient cares.      Sullivan Lone MD Denise AAHIVMS [facility] Hematology/Oncology Physician [facility]  (Office):       [phone] (Work cell):  [phone] (Fax):           [phone]  I, Baldwin Jamaica, am acting as a scribe for Dr. Irene Limbo  .I have reviewed the above documentation for accuracy and completeness, and I agree with the above. Brunetta Genera MD

## 2018-05-18 NOTE — Telephone Encounter (Signed)
Appts scheduled avs/calendar printed per 10/9 los °

## 2018-05-25 ENCOUNTER — Other Ambulatory Visit: Payer: Self-pay | Admitting: Family Medicine

## 2018-05-25 NOTE — Telephone Encounter (Signed)
Is This okay to refill?

## 2018-06-17 ENCOUNTER — Other Ambulatory Visit: Payer: Self-pay | Admitting: Hematology

## 2018-07-22 ENCOUNTER — Ambulatory Visit
Admission: RE | Admit: 2018-07-22 | Discharge: 2018-07-22 | Disposition: A | Discharge status: Home / Self Care | Payer: BC Managed Care – PPO | Source: Ambulatory Visit | Attending: Family Medicine | Admitting: Family Medicine

## 2018-07-22 ENCOUNTER — Encounter: Payer: Self-pay | Admitting: Family Medicine

## 2018-07-22 ENCOUNTER — Ambulatory Visit: Payer: BC Managed Care – PPO | Admitting: Family Medicine

## 2018-07-22 VITALS — BP 126/82 | HR 69 | Temp 97.6°F | Wt 196.6 lb

## 2018-07-22 DIAGNOSIS — Z9484 Stem cells transplant status: Secondary | ICD-10-CM | POA: Diagnosis not present

## 2018-07-22 DIAGNOSIS — C8448 Peripheral T-cell lymphoma, not classified, lymph nodes of multiple sites: Secondary | ICD-10-CM | POA: Diagnosis not present

## 2018-07-22 DIAGNOSIS — R222 Localized swelling, mass and lump, trunk: Secondary | ICD-10-CM

## 2018-07-22 NOTE — Progress Notes (Signed)
   Subjective:    Patient ID: Denise [name], female    DOB: [birth year], 62 y.o.   MRN: [id number]  HPI She is here for evaluation of left supraclavicular fullness.  She noticed this 2 days ago.  She has had no fever, chills, cough, congestion, shortness of breath.  She did have a stem cell transplant in September 2018.  She is followed regularly by Dr. Irene Limbo and at [facility].   Review of Systems     Objective:   Physical Exam Left supraclavicular fullness is noted but no mass.  No axillary or inguinal masses are palpable.       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

## 2018-07-25 ENCOUNTER — Other Ambulatory Visit: Payer: Self-pay | Admitting: Family Medicine

## 2018-07-25 DIAGNOSIS — Z638 Other specified problems related to primary support group: Secondary | ICD-10-CM

## 2018-07-25 NOTE — Telephone Encounter (Signed)
[facility] is requesting to fill pt meloxicam and xanax. Please advise [facility]

## 2018-08-08 ENCOUNTER — Other Ambulatory Visit: Payer: Self-pay

## 2018-08-08 MED ORDER — TRIAZOLAM 0.25 MG PO TABS: 0.2500 mg | ORAL_TABLET | Freq: Every evening | ORAL | 0 refills | Status: DC | PRN

## 2018-08-08 NOTE — Telephone Encounter (Signed)
Patient called to request a refill. Stated she's out.

## 2018-08-15 ENCOUNTER — Other Ambulatory Visit: Payer: Self-pay | Admitting: Family Medicine

## 2018-08-16 NOTE — Progress Notes (Signed)
Marland Kitchen    HEMATOLOGY/ONCOLOGY CLINIC NOTE  Date of Service: 08/17/18     Patient Care Team: Denita Lung, MD as PCP - General (Family Medicine) Dr. 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 The pt completed CHOEP followed by a preparative regimen for transplant April 2018-04/12/17. Autologous transplant was 04/13/17. The patient's last visit with [name] was on 05/18/18. The pt reports that she is doing well overall.   The pt reports that she noticed some swelling/puffiness in the left base of her neck 3 weeks ago, which has not changed, and did discuss this with her PCP, had a CXR which was not revealing. She notes that her partner was sick at the time that she noticed the new swelling, but did not herself become sick. She denies any fevers, chills, night sweats or unexpected weight loss. She notes that she has good energy levels and "remains mean as ever." She also denies any abdominal pains or leg swelling. She believes that she has gained weight over the holidays.   The pt notes that she will be having repeat imaging with [facility] in March 2020.  Lab results today (08/17/18) of CBC w/diff and CMP is as follows: all values are WNL except for PLT at 142k, Glucose at 116. 08/17/18 LDH at 233  On review of systems, pt reports good energy levels, some swelling in left base of neck, weight gain, and denies fevers, chills, night sweats, concerns for infections, unexpected weight loss, noticing any other lumps or bumps, abdominal pains, leg swelling, and any other symptoms.    MEDICAL HISTORY:  Past Medical History:  Diagnosis Date  . Allergy   . Asthma   . Bursitis of left hip   . Fibroids    TAH in 1999  .  GERD (gastroesophageal reflux disease)   . Lymphoma ([place])    T cell lymphoma  . PVC's (premature ventricular contractions)   . Sleep apnea    does not use CPAP    SURGICAL HISTORY: Past Surgical History:  Procedure Laterality Date  . CARDIAC CATH  05/2007   J. BERRY  . HAMMER TOE SURGERY Bilateral 2016  . IR GENERIC HISTORICAL  11/05/2016   IR US GUIDE VASC ACCESS RIGHT 11/05/2016 [name], MD [facility]  . IR GENERIC HISTORICAL  11/05/2016   IR FLUORO GUIDE PORT INSERTION RIGHT 11/05/2016 [name], MD [facility]  . TOTAL ABDOMINAL HYSTERECTOMY  05/15/1998   secondary to fibroids  . WISDOM TOOTH EXTRACTION  mid teens    SOCIAL HISTORY: Social History   Socioeconomic History  . Marital status: Significant Other    Spouse name: Not on file  . Number of children: 0  . Years of education: Not on file  . Highest education level: Not on file  Occupational History  . 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 Financial resource strain: Not on file  . Food insecurity:    Worry: Not on file    Inability: Not on file  . Transportation needs:    Medical: Not on file    Non-medical: Not on file  Tobacco Use  . Smoking status: Never  Smoker  . Smokeless tobacco: Never Used  Substance and Sexual Activity  . Alcohol use: No    Frequency: Never  . Drug use: No  . Sexual activity: Yes    Partners: Female    Birth control/protection: None  Lifestyle  . Physical activity:    Days per week: Not on file    Minutes per session: Not on file  . Stress: Not on file  Relationships  . Social connections:    Talks on phone: Not on file    Gets together: Not on file    Attends religious service: Not on file    Active member of club or organization: Not on file    Attends meetings of clubs or organizations: Not on file    Relationship status: Not on file  . Intimate partner violence:    Fear of current or ex  partner: Not on file    Emotionally abused: Not on file    Physically abused: Not on file    Forced sexual activity: Not on file  Other Topics Concern  . Not on file  Social History Narrative  . Not on file    FAMILY HISTORY: Family History  Problem Relation Age of Onset  . Cancer Maternal Grandmother        GI cancer in 38's  . Dementia Mother   . Heart disease Father   . Heart failure Father     ALLERGIES:  is allergic to morphine and related.  MEDICATIONS:  Current Outpatient Medications  Medication Sig Dispense Refill  . ADVAIR DISKUS 100-50 MCG/DOSE AEPB INHALE 1 PUFF INTO THE LUNGS DAILY AS NEEDED FOR SHORTNESS OF BREATH OR WHEEZING 3 each 3  . albuterol (PROVENTIL HFA;VENTOLIN HFA) 108 (90 Base) MCG/ACT inhaler Inhale 2 puffs into the lungs every 6 (six) hours as needed for wheezing or shortness of breath. 1 Inhaler 0  . ALPRAZolam (XANAX) 0.25 MG tablet TAKE 1 TABLET BY MOUTH TWICE DAILY AS NEEDED 50 tablet 0  . aspirin 81 MG chewable tablet Chew 81 mg by mouth daily as needed for moderate pain.     Marland Kitchen atenolol (TENORMIN) 50 MG tablet TAKE 1 TABLET BY MOUTH TWICE DAILY. 180 tablet 1  . esomeprazole (NEXIUM) 40 MG capsule Take 1 capsule (40 mg total) by mouth daily before breakfast. (Patient taking differently: Take 40 mg by mouth daily as needed (indigestion). ) 100 capsule 0  . Fluticasone-Salmeterol (WIXELA INHUB) 100-50 MCG/DOSE AEPB Inhale 1 puff into the lungs 2 (two) times daily. 1 each 5  . hydrochlorothiazide (MICROZIDE) 12.5 MG capsule TAKE 1 CAPSULE BY MOUTH EVERY DAY 30 capsule 0  . meloxicam (MOBIC) 15 MG tablet TAKE 1 TABLET BY MOUTH EVERY DAY AS NEEDED FOR MUSCLE SPASMS 90 tablet 0  . triazolam (HALCION) 0.25 MG tablet Take 1 tablet (0.25 mg total) by mouth at bedtime as needed. for sleep 90 tablet 0   No current facility-administered medications for this visit.     REVIEW OF SYSTEMS:    A 10+ POINT REVIEW OF SYSTEMS WAS OBTAINED including neurology,  dermatology, psychiatry, cardiac, respiratory, lymph, extremities, GI, GU, Musculoskeletal, constitutional, breasts, reproductive, HEENT.  All pertinent positives are noted in the HPI.  All others are negative.   PHYSICAL EXAMINATION:  ECOG PERFORMANCE STATUS: 1 - Symptomatic but completely ambulatory  . Vitals:   08/17/18 0840  BP: (!) 173/66  Pulse: 63  Resp: 18  Temp: 98.2 F (36.8 C)  SpO2: 100%   Filed Weights  08/17/18 0840  Weight: 195 lb 6.4 oz (88.6 kg)   .Body mass index is 33.54 kg/m.  GENERAL:alert, in no acute distress and comfortable SKIN: no acute rashes, no significant lesions EYES: conjunctiva are pink and non-injected, sclera anicteric OROPHARYNX: MMM, no exudates, no oropharyngeal erythema or ulceration NECK: supple, no JVD LYMPH:  no palpable lymphadenopathy in the cervical, axillary or inguinal regions LUNGS: clear to auscultation b/l with normal respiratory effort HEART: regular rate & rhythm ABDOMEN:  normoactive bowel sounds , non tender, not distended. No palpable hepatosplenomegaly.  Extremity: no pedal edema PSYCH: alert & oriented x 3 with fluent speech NEURO: no focal motor/sensory deficits   LABORATORY DATA:  I have reviewed the data as listed  . CBC Latest Ref Rng & Units 08/17/2018 05/18/2018 12/06/2017  WBC 4.0 - 10.5 K/uL 6.5 5.6 6.0  Hemoglobin 12.0 - 15.0 g/dL 12.7 12.4 13.3  Hematocrit 36.0 - 46.0 % 38.3 37.0 39.8  Platelets 150 - 400 K/uL 142(L) 155 151   . CMP Latest Ref Rng & Units 08/17/2018 05/18/2018 12/06/2017  Glucose 70 - 99 mg/dL 116(H) 106(H) 107  BUN 8 - 23 mg/dL '14 15 17  ' Creatinine 0.44 - 1.00 mg/dL 0.75 0.73 0.72  Sodium 135 - 145 mmol/L 141 140 139  Potassium 3.5 - 5.1 mmol/L 4.6 4.8 4.3  Chloride 98 - 111 mmol/L 104 105 103  CO2 22 - 32 mmol/L '28 27 29  ' Calcium 8.9 - 10.3 mg/dL 8.9 8.9 9.7  Total Protein 6.5 - 8.1 g/dL 6.7 6.4(L) 6.8  Total Bilirubin 0.3 - 1.2 mg/dL 0.5 0.3 0.5  Alkaline Phos 38 - 126 U/L 119 120  110  AST 15 - 41 U/L '19 18 18  ' ALT 0 - 44 U/L '18 15 14         ' RADIOGRAPHIC STUDIES: I have personally reviewed the radiological images as listed and agreed with the findings in the report. Dg Chest 2 View  Result Date: 07/22/2018 CLINICAL DATA:  Left supraclavicular fullness. History of non-Hodgkin's lymphoma. EXAM: CHEST - 2 VIEW COMPARISON:  01/18/2017 FINDINGS: Right Port-A-Cath remains in place, unchanged. Heart is normal size. Lungs are clear. No effusions. No acute bony abnormality. IMPRESSION: No active cardiopulmonary disease. Electronically Signed   By: Rolm Baptise M.D.   On: 07/22/2018 12:08     PET Lymphoma Hodgkins (with/ low dose CT) , 07/22/17 IMPRESSION: 1. Mildly hypermetabolic bilateral pulmonary airspace opacities that are favored to represent infection versus complications of stem cell transplant. Follow-up with diagnostic CT may be helpful to distinguish. 2. No FDG avid malignant disease at present. Ancillary CT findings as above.  04/14/18 CT C/A/P and NECK:        ASSESSMENT & PLAN:   63 y.o. 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   PET/CT scan shows bulky hypermetabolic lymphadenopathy throughout the neck, chest, and abdomen. 1 cm hypermetabolic pelvic lymph node in right external iliac chain. Hypermetabolic masslike opacity in central right lung with multiple adjacent hypermetabolic nodules. This is suspicious for pulmonary involvement by lymphoma  Fatigue anorexia or night sweats-drenching, and weight loss suggest constitutional type B symptoms.  Hepatitis profile and HIV neg  Patient is s/p 5 cycles of CHOEP tolerated well overall and received CHOP for 6th cycle due to significant cytopenias after 5th cycle of CHOEP. PET/CT on 01/08/2017 - showed near completely resolved LNadenopathy.  She received her autologous  stem cell transplant with Dr  Duanne Moron at [facility] on 04/13/2017 which she has tolerated well and has recovered well with resolved cytopenias.  -PET scan on 07/22/2017 with results of: No FDG avid malignant disease at present.  04/14/18 CT C/A/P and Neck revealed No evidence of disease return of progression    #2 history of PVCs -On chronic beta blocker therapy.  #3 history of sleep apnea -continue CPAP use  #4 severe anxiety - this has improved significantly. -Has been using Xanax when necessary.  -Okay to stop Cymbalta at this time with much improved anxiety and no issues with neuropathy at this time.  PLAN:  -Discussed pt labwork today, 08/17/18; WBC normal at 6.5k, HGB normal at 12.7, PLT stable at 142k. LDH stable compared to 3 months ago, at 66. -Did not palpate a discrete lymph node, and likely additional adipose tissue at left base of neck -Pt will let me know in the interim if she develops any additional concerns, or notices that the left puffiness in the base of her left neck changes  -The pt shows no clinical or lab progression/return of Peripheral T-Cell Lymphoma at this time. -No indication for further treatment at this time.   -Pt will obtain repeat CT C/A/P at [facility] in March 2020. If not, I will order a repeat scan.  -Pt knows to call my office with any new concerns in the interim and will monitor for any new lumps or bumps  -Pt completed prophylactic anti-virals for the first year and suggested vaccinations with Dr. Duanne Moron at [place] am happy to continue alternating visits for the pt with Dr. Jolayne Haines and continue watching labs. -Recommended that the pt continue to eat well, drink at least 48-64 oz of water each day, and walk 20-30 minutes each day.  -Will see the pt back in 6 weeks to re-evaluate any changes to the left neck findings and LDH   RTC with Dr Irene Limbo in 6 weeks with labs   All of the patients questions were answered with apparent satisfaction. The patient knows to call the clinic with any  problems, questions or concerns.  The total time spent in the appt was 25 minutes and more than 50% was on counseling and direct patient cares.     Sullivan Lone MD [place] AAHIVMS [facility] [place] Hematology/Oncology Physician [facility]  (Office):       [phone] (Work cell):  [phone] (Fax):           [phone]  I, Baldwin [place], am acting as a scribe for Dr. Sullivan Lone.   .I have reviewed the above documentation for accuracy and completeness, and I agree with the above. Brunetta Genera MD

## 2018-08-16 NOTE — Telephone Encounter (Signed)
Patient was informed that she needed to schedule a med check appt. She stated she was seen recently. Informed patient that was an Acute visit and not a med check. She insisted letting her provider know she don't want to schedule unless she absolutely needs to come in.

## 2018-08-16 NOTE — Telephone Encounter (Signed)
Have her set up for a med check appointment

## 2018-08-16 NOTE — Telephone Encounter (Signed)
This is not on patients med list

## 2018-08-17 ENCOUNTER — Inpatient Hospital Stay (HOSPITAL_BASED_OUTPATIENT_CLINIC_OR_DEPARTMENT_OTHER): Payer: BC Managed Care – PPO | Admitting: Hematology

## 2018-08-17 ENCOUNTER — Telehealth: Payer: Self-pay | Admitting: Hematology

## 2018-08-17 ENCOUNTER — Inpatient Hospital Stay: Payer: BC Managed Care – PPO | Attending: Hematology

## 2018-08-17 VITALS — BP 173/66 | HR 63 | Temp 98.2°F | Resp 18 | Ht 64.0 in | Wt 195.4 lb

## 2018-08-17 DIAGNOSIS — R918 Other nonspecific abnormal finding of lung field: Secondary | ICD-10-CM | POA: Insufficient documentation

## 2018-08-17 DIAGNOSIS — D696 Thrombocytopenia, unspecified: Secondary | ICD-10-CM

## 2018-08-17 DIAGNOSIS — Z8572 Personal history of non-Hodgkin lymphomas: Secondary | ICD-10-CM | POA: Insufficient documentation

## 2018-08-17 DIAGNOSIS — C8478 Anaplastic large cell lymphoma, ALK-negative, lymph nodes of multiple sites: Secondary | ICD-10-CM

## 2018-08-17 LAB — CMP (CANCER CENTER ONLY)
ALT: 18 U/L (ref 0–44)
AST: 19 U/L (ref 15–41)
Albumin: 3.8 g/dL (ref 3.5–5.0)
Alkaline Phosphatase: 119 U/L (ref 38–126)
Anion gap: 9 (ref 5–15)
BUN: 14 mg/dL (ref 8–23)
CO2: 28 mmol/L (ref 22–32)
Calcium: 8.9 mg/dL (ref 8.9–10.3)
Chloride: 104 mmol/L (ref 98–111)
Creatinine: 0.75 mg/dL (ref 0.44–1.00)
GFR, Est AFR Am: >60 mL/min (ref >60)
Glucose, Bld: 116 mg/dL — ABNORMAL HIGH (ref 70–99)
Potassium: 4.6 mmol/L (ref 3.5–5.1)
Sodium: 141 mmol/L (ref 135–145)
Total Bilirubin: 0.5 mg/dL (ref 0.3–1.2)
Total Protein: 6.7 g/dL (ref 6.5–8.1)

## 2018-08-17 LAB — CBC WITH DIFFERENTIAL/PLATELET
Abs Immature Granulocytes: 0.02 10*3/uL (ref 0.00–0.07)
Basophils Absolute: 0 10*3/uL (ref 0.0–0.1)
Basophils Relative: 1 %
Eosinophils Absolute: 0.1 10*3/uL (ref 0.0–0.5)
Eosinophils Relative: 2 %
HCT: 38.3 % (ref 36.0–46.0)
Hemoglobin: 12.7 g/dL (ref 12.0–15.0)
Immature Granulocytes: 0 %
Lymphocytes Relative: 25 %
Lymphs Abs: 1.6 10*3/uL (ref 0.7–4.0)
MCH: 29 pg (ref 26.0–34.0)
MCHC: 33.2 g/dL (ref 30.0–36.0)
MCV: 87.4 fL (ref 80.0–100.0)
Monocytes Absolute: 0.5 10*3/uL (ref 0.1–1.0)
Monocytes Relative: 8 %
Neutro Abs: 4.2 10*3/uL (ref 1.7–7.7)
Neutrophils Relative %: 64 %
Platelets: 142 10*3/uL — ABNORMAL LOW (ref 150–400)
RBC: 4.38 MIL/uL (ref 3.87–5.11)
RDW: 12.7 % (ref 11.5–15.5)
WBC: 6.5 10*3/uL (ref 4.0–10.5)
nRBC: 0 % (ref 0.0–0.2)

## 2018-08-17 LAB — LACTATE DEHYDROGENASE: LDH: 233 U/L — ABNORMAL HIGH (ref 98–192)

## 2018-08-17 NOTE — Telephone Encounter (Signed)
Patient declined calendar and avs. °

## 2018-09-04 ENCOUNTER — Telehealth: Payer: Self-pay | Admitting: Family Medicine

## 2018-09-04 NOTE — Telephone Encounter (Signed)
P.A. TRIAZOLAM completed

## 2018-09-05 ENCOUNTER — Other Ambulatory Visit: Payer: Self-pay | Admitting: Family Medicine

## 2018-09-05 NOTE — Telephone Encounter (Signed)
[facility] is requesting to fill pt Wixela. Please advise [facility]

## 2018-09-12 ENCOUNTER — Other Ambulatory Visit: Payer: Self-pay | Admitting: Family Medicine

## 2018-09-13 ENCOUNTER — Other Ambulatory Visit: Payer: Self-pay | Admitting: Family Medicine

## 2018-09-13 DIAGNOSIS — Z638 Other specified problems related to primary support group: Secondary | ICD-10-CM

## 2018-09-13 NOTE — Telephone Encounter (Signed)
[facility] is requesting to fill pt xanax . Please advise [facility] 

## 2018-09-20 ENCOUNTER — Other Ambulatory Visit: Payer: Self-pay | Admitting: Family Medicine

## 2018-09-20 NOTE — Telephone Encounter (Signed)
Called pt to see if she needs script for atenolol due to it being fill 30 days ago for 90 day with one refill. [place]

## 2018-09-27 NOTE — Progress Notes (Signed)
Denise [name]    HEMATOLOGY/ONCOLOGY CLINIC NOTE  Date of Service: 09/28/18     Patient Care Team: Denita Lung, MD as PCP - General (Family Medicine) Dr. 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 The pt completed CHOEP followed by a preparative regimen for transplant April 2018-04/12/17. Autologous transplant was 04/13/17. The patient's last visit with [name] was on 08/17/18. The pt reports that she is doing well overall.   The pt reports that she will be seeing Dr. Jolayne Haines on 10/13/18 with a repeat scan. She notes that her previously felt neck enlargement have improved in the interim, and she no longer notices this. She notes that she has been eating well and has enjoyed good energy levels. She denies developing any new concerns and denies constitutional symptoms.  Lab results today (09/28/18) of CBC w/diff and CMP is as follows: all values are WNL except for PLT at 143k, Glucose at 112, Calcium at 8.8. 09/28/18 LDH at 227  On review of systems, pt reports good energy levels, eating well, and denies noticing any new lumps or bumps, abdominal pains, fevers, chills, night sweats, and any other symptoms.    MEDICAL HISTORY:  Past Medical History:  Diagnosis Date  . Allergy   . Asthma   . Bursitis of left hip   . Fibroids    TAH in 1999  . GERD (gastroesophageal reflux disease)   . Lymphoma ([place])    T cell lymphoma  . PVC's (premature ventricular contractions)   . Sleep apnea    does not use CPAP    SURGICAL HISTORY: Past Surgical History:  Procedure Laterality Date  . CARDIAC CATH  05/2007   J. BERRY  . HAMMER TOE SURGERY Bilateral 2016  . IR GENERIC HISTORICAL  11/05/2016   IR US GUIDE VASC ACCESS  RIGHT 11/05/2016 [name], MD [facility]  . IR GENERIC HISTORICAL  11/05/2016   IR FLUORO GUIDE PORT INSERTION RIGHT 11/05/2016 [name], MD [facility]  . TOTAL ABDOMINAL HYSTERECTOMY  05/15/1998   secondary to fibroids  . WISDOM TOOTH EXTRACTION  mid teens    SOCIAL HISTORY: Social History   Socioeconomic History  . Marital status: Significant Other    Spouse name: Not on file  . Number of children: 0  . Years of education: Not on file  . Highest education level: Not on file  Occupational History  . 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[employer]: [employer]  . Financial resource strain: Not on file  . Food insecurity:    Worry: Not on file    Inability: Not on file  . Transportation needs:    Medical: Not on file    Non-medical: Not on file  Tobacco Use  . Smoking status: Never Smoker  . Smokeless tobacco: Never Used  Substance and Sexual Activity  . Alcohol use: No    Frequency: Never  . Drug use: No  . Sexual activity: Yes    Partners: Female    Birth control/protection: None  Lifestyle  . Physical activity:    Days per week: Not on file    Minutes per session: Not on file  . Stress: Not  on file  Relationships  . Social connections:    Talks on phone: Not on file    Gets together: Not on file    Attends religious service: Not on file    Active member of club or organization: Not on file    Attends meetings of clubs or organizations: Not on file    Relationship status: Not on file  . Intimate partner violence:    Fear of current or ex partner: Not on file    Emotionally abused: Not on file    Physically abused: Not on file    Forced sexual activity: Not on file  Other Topics Concern  . Not on file  Social History Narrative  . Not on file    FAMILY HISTORY: Family History  Problem Relation Age of Onset  . Cancer Maternal Grandmother        GI cancer in 27's  . Dementia Mother   . Heart disease  Father   . Heart failure Father     ALLERGIES:  is allergic to morphine and related.  MEDICATIONS:  Current Outpatient Medications  Medication Sig Dispense Refill  . ADVAIR DISKUS 100-50 MCG/DOSE AEPB INHALE 1 PUFF INTO THE LUNGS DAILY AS NEEDED FOR SHORTNESS OF BREATH OR WHEEZING 3 each 3  . albuterol (PROVENTIL HFA;VENTOLIN HFA) 108 (90 Base) MCG/ACT inhaler Inhale 2 puffs into the lungs every 6 (six) hours as needed for wheezing or shortness of breath. 1 Inhaler 0  . ALPRAZolam (XANAX) 0.25 MG tablet TAKE 1 TABLET BY MOUTH TWICE DAILY AS NEEDED 50 tablet 0  . aspirin 81 MG chewable tablet Chew 81 mg by mouth daily as needed for moderate pain.     Denise [name] atenolol (TENORMIN) 50 MG tablet TAKE 1 TABLET BY MOUTH TWICE DAILY. 180 tablet 1  . esomeprazole (NEXIUM) 40 MG capsule Take 1 capsule (40 mg total) by mouth daily before breakfast. (Patient taking differently: Take 40 mg by mouth daily as needed (indigestion). ) 100 capsule 0  . hydrochlorothiazide (MICROZIDE) 12.5 MG capsule TAKE 1 CAPSULE BY MOUTH EVERY DAY 30 capsule 0  . meloxicam (MOBIC) 15 MG tablet TAKE 1 TABLET BY MOUTH EVERY DAY AS NEEDED FOR MUSCLE SPASMS 90 tablet 0  . triazolam (HALCION) 0.25 MG tablet Take 1 tablet (0.25 mg total) by mouth at bedtime as needed. for sleep 90 tablet 0  . WIXELA INHUB 100-50 MCG/DOSE AEPB INHALE 1 PUFF INTO THE LUNGS TWICE DAILY 60 each 5   No current facility-administered medications for this visit.     REVIEW OF SYSTEMS:    A 10+ POINT REVIEW OF SYSTEMS WAS OBTAINED including neurology, dermatology, psychiatry, cardiac, respiratory, lymph, extremities, GI, GU, Musculoskeletal, constitutional, breasts, reproductive, HEENT.  All pertinent positives are noted in the HPI.  All others are negative.   PHYSICAL EXAMINATION:  ECOG PERFORMANCE STATUS: 1 - Symptomatic but completely ambulatory  . Vitals:   09/28/18 1524  BP: (!) 153/85  Pulse: 62  Resp: 17  Temp: 98.5 F (36.9 C)  SpO2: 100%    Filed Weights   09/28/18 1524  Weight: 197 lb (89.4 kg)   .Body mass index is 33.81 kg/m.  GENERAL:alert, in no acute distress and comfortable SKIN: no acute rashes, no significant lesions EYES: conjunctiva are pink and non-injected, sclera anicteric OROPHARYNX: MMM, no exudates, no oropharyngeal erythema or ulceration NECK: supple, no JVD LYMPH:  no palpable lymphadenopathy in the cervical, axillary or inguinal regions LUNGS: clear to auscultation b/l with normal respiratory effort  HEART: regular rate & rhythm ABDOMEN:  normoactive bowel sounds , non tender, not distended. No palpable hepatosplenomegaly.  Extremity: no pedal edema PSYCH: alert & oriented x 3 with fluent speech NEURO: no focal motor/sensory deficits   LABORATORY DATA:  I have reviewed the data as listed  . CBC Latest Ref Rng & Units 09/28/2018 08/17/2018 05/18/2018  WBC 4.0 - 10.5 K/uL 6.9 6.5 5.6  Hemoglobin 12.0 - 15.0 g/dL 13.2 12.7 12.4  Hematocrit 36.0 - 46.0 % 38.7 38.3 37.0  Platelets 150 - 400 K/uL 143(L) 142(L) 155   . CMP Latest Ref Rng & Units 09/28/2018 08/17/2018 05/18/2018  Glucose 70 - 99 mg/dL 112(H) 116(H) 106(H)  BUN 8 - 23 mg/dL _0 Creatinine 0.44 - 1.00 mg/dL 0.83 0.75 0.73  Sodium 135 - 145 mmol/L 140 141 140  Potassium 3.5 - 5.1 mmol/L 3.8 4.6 4.8  Chloride 98 - 111 mmol/L 104 104 105  CO2 22 - 32 mmol/L _1 Calcium 8.9 - 10.3 mg/dL 8.8(L) 8.9 8.9  Total Protein 6.5 - 8.1 g/dL 6.9 6.7 6.4(L)  Total Bilirubin 0.3 - 1.2 mg/dL 0.3 0.5 0.3  Alkaline Phos 38 - 126 U/L 125 119 120  AST 15 - 41 U/L _2 ALT 0 - 44 U/L _3 RADIOGRAPHIC STUDIES: I have personally reviewed the radiological images as listed and agreed with the findings in the report. No results found.   PET Lymphoma Hodgkins (with/ low dose CT) , 07/22/17 IMPRESSION: 1. Mildly hypermetabolic bilateral pulmonary airspace opacities that are favored to represent infection versus complications  of stem cell transplant. Follow-up with diagnostic CT may be helpful to distinguish. 2. No FDG avid malignant disease at present. Ancillary CT findings as above.  04/14/18 CT C/A/P and NECK:        ASSESSMENT & PLAN:   63 y.o. 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   PET/CT scan shows bulky hypermetabolic lymphadenopathy throughout the neck, chest, and abdomen. 1 cm hypermetabolic pelvic lymph node in right external iliac chain. Hypermetabolic masslike opacity in central right lung with multiple adjacent hypermetabolic nodules. This is suspicious for pulmonary involvement by lymphoma  Fatigue anorexia or night sweats-drenching, and weight loss suggest constitutional type B symptoms.  Hepatitis profile and HIV neg  Patient is s/p 5 cycles of CHOEP tolerated well overall and received CHOP for 6th cycle due to significant cytopenias after 5th cycle of CHOEP. PET/CT on 01/08/2017 - showed near completely resolved LNadenopathy.  She received her autologous stem cell transplant with Dr Duanne Moron at [facility] on 04/13/2017 which she has tolerated well and has recovered well with resolved cytopenias.  -PET scan on 07/22/2017 with results of: No FDG avid malignant disease at present.  04/14/18 CT C/A/P and Neck revealed No evidence of disease return of progression    #2 history of PVCs -On chronic beta blocker therapy.  #3 history of sleep apnea -continue CPAP use  #4 severe anxiety - this has improved significantly. -Has been using Xanax when necessary.  -Okay to stop Cymbalta at this time with much improved anxiety and no issues with neuropathy at this time.  PLAN:  -Discussed pt labwork today, 09/28/18; blood counts and chemistries are stable. LDH improved to 227. -The pt shows no clinical or lab progression of her T-Cell lymphoma at this  time.  -No indication for further treatment at this  time -Pt will obtain repeat CT C/A/P at [facility] with Dr. Jolayne Haines in March 2020 -Pt knows to call my office with any new concerns in the interim and will monitor for any new lumps or bumps  -Pt completed prophylactic anti-virals for the first year and suggested vaccinations with Dr. J[facility]ayne Haines at [facility]  -I am happy to continue alternating visits for the pt with Dr. Jolayne Haines and continue watching labs. -Recommended that the pt continue to eat well, drink at least 48-64 oz of water each day, and walk 20-30 minutes each day. -Will see the pt back in 4 months   RTC with Dr Irene Limbo with labs in 4 months   All of the patients questions were answered with apparent satisfaction. The patient knows to call the clinic with any problems, questions or concerns.  The total time spent in the appt was 20 minutes and more than 50% was on counseling and direct patient cares.    Sullivan Lone MD [place] AAHIVMS [facility] Hematology/Oncology Physician [facility]  (Office):       [phone] (Work cell):  [phone] (Fax):           [phone]  I, Baldwin Jamaica, am acting as a scribe for Dr. Sullivan Lone.   .I have reviewed the above documentation for accuracy and completeness, and I agree with the above. Brunetta Genera MD

## 2018-09-28 ENCOUNTER — Encounter: Payer: Self-pay | Admitting: Family Medicine

## 2018-09-28 ENCOUNTER — Telehealth: Payer: Self-pay | Admitting: Hematology

## 2018-09-28 ENCOUNTER — Inpatient Hospital Stay: Payer: BC Managed Care – PPO | Attending: Hematology

## 2018-09-28 ENCOUNTER — Inpatient Hospital Stay (HOSPITAL_BASED_OUTPATIENT_CLINIC_OR_DEPARTMENT_OTHER): Payer: BC Managed Care – PPO | Admitting: Hematology

## 2018-09-28 VITALS — BP 153/85 | HR 62 | Temp 98.5°F | Resp 17 | Ht 64.0 in | Wt 197.0 lb

## 2018-09-28 DIAGNOSIS — C8478 Anaplastic large cell lymphoma, ALK-negative, lymph nodes of multiple sites: Secondary | ICD-10-CM

## 2018-09-28 DIAGNOSIS — Z8572 Personal history of non-Hodgkin lymphomas: Secondary | ICD-10-CM | POA: Insufficient documentation

## 2018-09-28 LAB — CBC WITH DIFFERENTIAL/PLATELET
Abs Immature Granulocytes: 0.02 10*3/uL (ref 0.00–0.07)
Basophils Absolute: 0 10*3/uL (ref 0.0–0.1)
Basophils Relative: 0 %
Eosinophils Absolute: 0.1 10*3/uL (ref 0.0–0.5)
Eosinophils Relative: 2 %
HCT: 38.7 % (ref 36.0–46.0)
Hemoglobin: 13.2 g/dL (ref 12.0–15.0)
Immature Granulocytes: 0 %
Lymphocytes Relative: 26 %
Lymphs Abs: 1.8 10*3/uL (ref 0.7–4.0)
MCH: 29.1 pg (ref 26.0–34.0)
MCHC: 34.1 g/dL (ref 30.0–36.0)
MCV: 85.2 fL (ref 80.0–100.0)
MONO ABS: 0.7 10*3/uL (ref 0.1–1.0)
Monocytes Relative: 9 %
NEUTROS ABS: 4.3 10*3/uL (ref 1.7–7.7)
Neutrophils Relative %: 63 %
Platelets: 143 10*3/uL — ABNORMAL LOW (ref 150–400)
RBC: 4.54 MIL/uL (ref 3.87–5.11)
RDW: 12.8 % (ref 11.5–15.5)
WBC: 6.9 10*3/uL (ref 4.0–10.5)
nRBC: 0 % (ref 0.0–0.2)

## 2018-09-28 LAB — CMP (CANCER CENTER ONLY)
ALT: 17 U/L (ref 0–44)
AST: 17 U/L (ref 15–41)
Albumin: 3.9 g/dL (ref 3.5–5.0)
Alkaline Phosphatase: 125 U/L (ref 38–126)
Anion gap: 9 (ref 5–15)
BUN: 13 mg/dL (ref 8–23)
CO2: 27 mmol/L (ref 22–32)
Calcium: 8.8 mg/dL — ABNORMAL LOW (ref 8.9–10.3)
Chloride: 104 mmol/L (ref 98–111)
Creatinine: 0.83 mg/dL (ref 0.44–1.00)
GFR, Est AFR Am: >60 mL/min (ref >60)
GFR, Estimated: >60 mL/min (ref >60)
Glucose, Bld: 112 mg/dL — ABNORMAL HIGH (ref 70–99)
Potassium: 3.8 mmol/L (ref 3.5–5.1)
Sodium: 140 mmol/L (ref 135–145)
Total Bilirubin: 0.3 mg/dL (ref 0.3–1.2)
Total Protein: 6.9 g/dL (ref 6.5–8.1)

## 2018-09-28 LAB — LACTATE DEHYDROGENASE: LDH: 227 U/L — ABNORMAL HIGH (ref 98–192)

## 2018-09-28 NOTE — Telephone Encounter (Signed)
P.A. denied states plan only pays for #10 tablets of insomnia agents per month

## 2018-09-28 NOTE — Telephone Encounter (Signed)
Appeal letter typed and faxed to t# [phone]

## 2018-09-28 NOTE — Telephone Encounter (Signed)
Gave avs and calendar ° °

## 2018-10-08 NOTE — Telephone Encounter (Signed)
Appeal denied, [employer] covers maximum of 10 per month

## 2018-10-11 NOTE — Telephone Encounter (Signed)
Called pharmacy and pt got #30 08/22/18 and next fill will be 11/05/18 & he will try to run it for #36 pills which would be considered 90 days supply (#30 is 75 day) per what insurance will pay for.  So only option is for pt to pay out of pocket for the difference in pills and use [employer] discount card for cash pay.  Left message for pt

## 2018-10-14 ENCOUNTER — Other Ambulatory Visit: Payer: Self-pay | Admitting: Family Medicine

## 2018-11-02 ENCOUNTER — Other Ambulatory Visit: Payer: Self-pay | Admitting: Family Medicine

## 2018-11-02 DIAGNOSIS — Z638 Other specified problems related to primary support group: Secondary | ICD-10-CM

## 2018-11-02 NOTE — Telephone Encounter (Signed)
Is this okay to refill? 

## 2018-11-16 ENCOUNTER — Other Ambulatory Visit: Payer: Self-pay | Admitting: Family Medicine

## 2018-11-27 ENCOUNTER — Other Ambulatory Visit: Payer: Self-pay | Admitting: Family Medicine

## 2018-11-28 NOTE — Telephone Encounter (Signed)
[facility] is requesting to fill pt Mobic. Please advise [facility]

## 2018-12-13 ENCOUNTER — Telehealth: Payer: Self-pay | Admitting: Family Medicine

## 2018-12-13 MED ORDER — ESOMEPRAZOLE MAGNESIUM 40 MG PO CPDR: 40.0000 mg | DELAYED_RELEASE_CAPSULE | Freq: Every day | ORAL | 1 refills | Status: DC

## 2018-12-13 NOTE — Telephone Encounter (Signed)
Pt called & request a prescription for Nexium states the OTC isn't working as well (thinks is only 20mg ) and her insurance will pay for the 40mg  that she was on before.  Please send to [facility]

## 2018-12-14 ENCOUNTER — Other Ambulatory Visit: Payer: Self-pay | Admitting: Family Medicine

## 2018-12-29 ENCOUNTER — Other Ambulatory Visit: Payer: Self-pay | Admitting: Family Medicine

## 2018-12-29 DIAGNOSIS — Z638 Other specified problems related to primary support group: Secondary | ICD-10-CM

## 2018-12-29 NOTE — Telephone Encounter (Signed)
[facility] is requesting to fill pt xanax and triazolam. Please advise [facility]

## 2019-01-10 ENCOUNTER — Other Ambulatory Visit: Payer: Self-pay | Admitting: Family Medicine

## 2019-01-26 NOTE — Progress Notes (Signed)
Marland Kitchen    HEMATOLOGY/ONCOLOGY CLINIC NOTE  Date of Service: 01/27/19     Patient Care Team: Denita Lung, MD as PCP - General (Family Medicine) Dr. 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 The pt completed CHOEP followed by a preparative regimen for transplant April 2018-04/12/17. Autologous transplant was 04/13/17. The patient's last visit with [name] was on 09/28/18. The pt reports that she is doing well overall.  The pt reports that she has been staying active but has been social distancing and denies any concerns for infections at this time. She notes that she has not developed any new concerns in the interim. She has been enjoying good energy levels. She notes that she has been eating well and has gained a few pounds since our last visit 4 months ago.  Lab results today (01/27/19) of CBC w/diff and CMP is as follows: all values are WNL except for PLT at 147k, Glucose at 110, Calcium at 8.7. 01/27/19 LDH at 193  On review of systems, pt reports good energy levels, eating well, mild weight gain, staying active, and denies concerns for infections, noticing any new lumps or bumps, pain along the spine, abdominal pains, leg swelling, and any other symptoms.    MEDICAL HISTORY:  Past Medical History:  Diagnosis Date  . Allergy   . Asthma   . Bursitis of left hip   . Fibroids    TAH in 1999  . GERD (gastroesophageal reflux disease)   . Lymphoma ([place])    T cell lymphoma  . PVC's (premature ventricular contractions)   . Sleep apnea    does not use CPAP    SURGICAL HISTORY: Past Surgical History:  Procedure Laterality Date  . CARDIAC CATH  05/2007   J. BERRY  . HAMMER TOE SURGERY Bilateral 2016   . IR GENERIC HISTORICAL  11/05/2016   IR US GUIDE VASC ACCESS RIGHT 11/05/2016 [name], MD [facility]  . IR GENERIC HISTORICAL  11/05/2016   IR FLUORO GUIDE PORT INSERTION RIGHT 11/05/2016 [name], MD [facility]  . TOTAL ABDOMINAL HYSTERECTOMY  05/15/1998   secondary to fibroids  . WISDOM TOOTH EXTRACTION  mid teens    SOCIAL HISTORY: Social History   Socioeconomic History  . Marital status: Significant Other    Spouse name: Not on file  . Number of children: 0  . Years of education: Not on file  . Highest education level: Not on file  Occupational History  . 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[employer]: [employer]  . Financial resource strain: Not on file  . Food insecurity    Worry: Not on file    Inability: Not on file  . Transportation needs    Medical: Not on file    Non-medical: Not on file  Tobacco Use  . Smoking status: Never Smoker  . Smokeless tobacco: Never Used  Substance and Sexual Activity  . Alcohol use: No    Frequency: Never  . Drug use: No  . Sexual activity: Yes    Partners: Female    Birth control/protection: None  Lifestyle  . Physical activity    Days per week: Not on  file    Minutes per session: Not on file  . Stress: Not on file  Relationships  . Social Herbalist on phone: Not on file    Gets together: Not on file    Attends religious service: Not on file    Active member of club or organization: Not on file    Attends meetings of clubs or organizations: Not on file    Relationship status: Not on file  . Intimate partner violence    Fear of current or ex partner: Not on file    Emotionally abused: Not on file    Physically abused: Not on file    Forced sexual activity: Not on file  Other Topics Concern  . Not on file  Social History Narrative  . Not on file    FAMILY HISTORY: Family History  Problem Relation Age of Onset  . Cancer Maternal Grandmother         GI cancer in 75's  . Dementia Mother   . Heart disease Father   . Heart failure Father     ALLERGIES:  is allergic to morphine and related.  MEDICATIONS:  Current Outpatient Medications  Medication Sig Dispense Refill  . albuterol (PROVENTIL HFA;VENTOLIN HFA) 108 (90 Base) MCG/ACT inhaler Inhale 2 puffs into the lungs every 6 (six) hours as needed for wheezing or shortness of breath. 1 Inhaler 0  . ALPRAZolam (XANAX) 0.25 MG tablet TAKE 1 TABLET BY MOUTH TWICE DAILY AS NEEDED 50 tablet 0  . aspirin 81 MG chewable tablet Chew 81 mg by mouth daily as needed for moderate pain.     Marland Kitchen atenolol (TENORMIN) 50 MG tablet TAKE 1 TABLET BY MOUTH TWICE DAILY. 180 tablet 1  . esomeprazole (NEXIUM) 40 MG capsule Take 1 capsule (40 mg total) by mouth daily before breakfast. 90 capsule 1  . hydrochlorothiazide (MICROZIDE) 12.5 MG capsule TAKE 1 CAPSULE BY MOUTH EVERY DAY 30 capsule 0  . meloxicam (MOBIC) 15 MG tablet TAKE 1 TABLET BY MOUTH EVERY DAY AS NEEDED FOR MUSCLE SPASMS 90 tablet 0  . triazolam (HALCION) 0.25 MG tablet TAKE 1 TABLET(0.25 MG) BY MOUTH AT BEDTIME AS NEEDED FOR SLEEP 90 tablet 0  . WIXELA INHUB 100-50 MCG/DOSE AEPB INHALE 1 PUFF INTO THE LUNGS TWICE DAILY 60 each 5  . ADVAIR DISKUS 100-50 MCG/DOSE AEPB INHALE 1 PUFF INTO THE LUNGS DAILY AS NEEDED FOR SHORTNESS OF BREATH OR WHEEZING (Patient not taking: Reported on 09/29/2018) 3 each 3   Current Facility-Administered Medications  Medication Dose Route Frequency Provider Last Rate Last Dose  . sodium chloride flush (NS) 0.9 % injection 10 mL  10 mL Intravenous Once Irene Limbo, Cloria [place], MD        REVIEW OF SYSTEMS:    A 10+ POINT REVIEW OF SYSTEMS WAS OBTAINED including neurology, dermatology, psychiatry, cardiac, respiratory, lymph, extremities, GI, GU, Musculoskeletal, constitutional, breasts, reproductive, HEENT.  All pertinent positives are noted in the HPI.  All others are negative.   PHYSICAL EXAMINATION:  ECOG PERFORMANCE  STATUS: 1 - Symptomatic but completely ambulatory  Vitals:   01/27/19 0851  BP: 140/60  Pulse: 62  Resp: 17  Temp: 97.8 F (36.6 C)  SpO2: 99%   Filed Weights   01/27/19 0851  Weight: 203 lb 11.2 oz (92.4 kg)   Body mass index is 34.97 kg/m.  GENERAL:alert, in no acute distress and comfortable SKIN: no acute rashes, no significant lesions EYES: conjunctiva are pink and non-injected, sclera anicteric  OROPHARYNX: MMM, no exudates, no oropharyngeal erythema or ulceration NECK: supple, no JVD LYMPH:  no palpable lymphadenopathy in the cervical, axillary or inguinal regions LUNGS: clear to auscultation b/l with normal respiratory effort HEART: regular rate & rhythm ABDOMEN:  normoactive bowel sounds , non tender, not distended. No palpable hepatosplenomegaly.  Extremity: no pedal edema PSYCH: alert & oriented x 3 with fluent speech NEURO: no focal motor/sensory deficits   LABORATORY DATA:  I have reviewed the data as listed  . CBC Latest Ref Rng & Units 01/27/2019 09/28/2018 08/17/2018  WBC 4.0 - 10.5 K/uL 6.2 6.9 6.5  Hemoglobin 12.0 - 15.0 g/dL 13.2 13.2 12.7  Hematocrit 36.0 - 46.0 % 40.3 38.7 38.3  Platelets 150 - 400 K/uL 147(L) 143(L) 142(L)   . CMP Latest Ref Rng & Units 01/27/2019 09/28/2018 08/17/2018  Glucose 70 - 99 mg/dL 110(H) 112(H) 116(H)  BUN 8 - 23 mg/dL '16 13 14  ' Creatinine 0.44 - 1.00 mg/dL 0.82 0.83 0.75  Sodium 135 - 145 mmol/L 137 140 141  Potassium 3.5 - 5.1 mmol/L 4.3 3.8 4.6  Chloride 98 - 111 mmol/L 100 104 104  CO2 22 - 32 mmol/L '27 27 28  ' Calcium 8.9 - 10.3 mg/dL 8.7(L) 8.8(L) 8.9  Total Protein 6.5 - 8.1 g/dL 7.1 6.9 6.7  Total Bilirubin 0.3 - 1.2 mg/dL 0.5 0.3 0.5  Alkaline Phos 38 - 126 U/L 114 125 119  AST 15 - 41 U/L '19 17 19  ' ALT 0 - 44 U/L '21 17 18         ' RADIOGRAPHIC STUDIES: I have personally reviewed the radiological images as listed and agreed with the findings in the report. No results found.   PET Lymphoma Hodgkins (with/  low dose CT) , 07/22/17 IMPRESSION: 1. Mildly hypermetabolic bilateral pulmonary airspace opacities that are favored to represent infection versus complications of stem cell transplant. Follow-up with diagnostic CT may be helpful to distinguish. 2. No FDG avid malignant disease at present. Ancillary CT findings as above.  04/14/18 CT C/A/P and NECK:        10/13/18 CT C/A/P and Neck:      ASSESSMENT & PLAN:   63 y.o. 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   PET/CT scan shows bulky hypermetabolic lymphadenopathy throughout the neck, chest, and abdomen. 1 cm hypermetabolic pelvic lymph node in right external iliac chain. Hypermetabolic masslike opacity in central right lung with multiple adjacent hypermetabolic nodules. This is suspicious for pulmonary involvement by lymphoma  Fatigue anorexia or night sweats-drenching, and weight loss suggest constitutional type B symptoms.  Hepatitis profile and HIV neg  Patient is s/p 5 cycles of CHOEP tolerated well overall and received CHOP for 6th cycle due to significant cytopenias after 5th cycle of CHOEP. PET/CT on 01/08/2017 - showed near completely resolved LNadenopathy.  She received her autologous stem cell transplant with Dr Duanne Moron at [facility] on 04/13/2017 which she has tolerated well and has recovered well with resolved cytopenias.  -PET scan on 07/22/2017 with results of: No FDG avid malignant disease at present.  04/14/18 CT C/A/P and Neck revealed No evidence of disease return of progression    #2 history of PVCs -On chronic beta blocker therapy.  #3 history of sleep apnea -continue CPAP use  #4 severe anxiety - this has improved significantly. -Has been using Xanax when necessary.  -Okay to stop Cymbalta at this time  with much improved anxiety and no issues with neuropathy at this time.  PLAN:  -Discussed pt labwork today,  01/27/19; blood counts and chemistries are stable. LDH normal at 193. -Discussed the 10/13/18 CT C/A/P which revealed "No CT evidence to suggest residual or recurrent metastatic disease in the chest, abdomen, or pelvis in this patient with treated prior lymphoma. 2. Bilateral upper lobe subcentimeter pulmonary parenchymal nodules, not significantly changed in size. Continued attention on follow-up imaging. 3. Other ancillary findings, as described above, not significantly changed in the interim when compared to prior study dated 04/14/2018." -Discussed the 10/13/18 CT Neck which revealed "No evidence of progressive disease. No pathologically enlarged or suspicious lymph nodes in the neck." -The pt shows no clinical, radiographic, or lab progression/return of T-Cell Lymphoma at this time.  -No indication for further treatment at this time.  -Pt completed prophylactic anti-virals for the first year and suggested vaccinations with Dr. J[facility]ayne Haines at [facility]. -I am happy to continue alternating visits for the pt with Dr. Jolayne Haines and continue watching labs. -Pt has continued with port flushes every 3 months. She is close to two years post transplant, and we will order its removal in December 2020 -Recommended that the pt continue to eat well, drink at least 48-64 oz of water each day, and walk 20-30 minutes each day. -Will see the pt back in 6 months   RTC with Dr Irene Limbo with labs and port flush in 6 months Patient had f/u appointment in 3 months @ [facility] and will have next prt flush there   All of the patients questions were answered with apparent satisfaction. The patient knows to call the clinic with any problems, questions or concerns.  The total time spent in the appt was 25 minutes and more than 50% was on counseling and direct patient cares.    Sullivan Lone MD [place] AAHIVMS [facility] Hematology/Oncology Physician [facility]  (Office):       [phone] (Work cell):  [phone] (Fax):            [phone]  I, Baldwin Jamaica, am acting as a scribe for Dr. Sullivan Lone.   .I have reviewed the above documentation for accuracy and completeness, and I agree with the above. Brunetta Genera MD

## 2019-01-27 ENCOUNTER — Inpatient Hospital Stay: Payer: BC Managed Care – PPO | Attending: Hematology | Admitting: Hematology

## 2019-01-27 ENCOUNTER — Inpatient Hospital Stay: Payer: BC Managed Care – PPO

## 2019-01-27 ENCOUNTER — Other Ambulatory Visit: Payer: Self-pay

## 2019-01-27 ENCOUNTER — Telehealth: Payer: Self-pay | Admitting: Hematology

## 2019-01-27 ENCOUNTER — Encounter: Payer: Self-pay | Admitting: Hematology

## 2019-01-27 VITALS — BP 140/60 | HR 62 | Temp 97.8°F | Resp 17 | Ht 64.0 in | Wt 203.7 lb

## 2019-01-27 DIAGNOSIS — C8478 Anaplastic large cell lymphoma, ALK-negative, lymph nodes of multiple sites: Secondary | ICD-10-CM

## 2019-01-27 DIAGNOSIS — Z8572 Personal history of non-Hodgkin lymphomas: Secondary | ICD-10-CM

## 2019-01-27 DIAGNOSIS — Z95828 Presence of other vascular implants and grafts: Secondary | ICD-10-CM

## 2019-01-27 DIAGNOSIS — Z452 Encounter for adjustment and management of vascular access device: Secondary | ICD-10-CM | POA: Insufficient documentation

## 2019-01-27 LAB — CBC WITH DIFFERENTIAL/PLATELET
Abs Immature Granulocytes: 0.02 10*3/uL (ref 0.00–0.07)
Basophils Absolute: 0 10*3/uL (ref 0.0–0.1)
Basophils Relative: 1 %
Eosinophils Absolute: 0.1 10*3/uL (ref 0.0–0.5)
Eosinophils Relative: 2 %
HCT: 40.3 % (ref 36.0–46.0)
Hemoglobin: 13.2 g/dL (ref 12.0–15.0)
Immature Granulocytes: 0 %
Lymphocytes Relative: 28 %
Lymphs Abs: 1.7 10*3/uL (ref 0.7–4.0)
MCH: 28.9 pg (ref 26.0–34.0)
MCHC: 32.8 g/dL (ref 30.0–36.0)
MCV: 88.2 fL (ref 80.0–100.0)
Monocytes Absolute: 0.5 10*3/uL (ref 0.1–1.0)
Monocytes Relative: 8 %
Neutro Abs: 3.8 10*3/uL (ref 1.7–7.7)
Neutrophils Relative %: 61 %
Platelets: 147 10*3/uL — ABNORMAL LOW (ref 150–400)
RBC: 4.57 MIL/uL (ref 3.87–5.11)
RDW: 13 % (ref 11.5–15.5)
WBC: 6.2 10*3/uL (ref 4.0–10.5)
nRBC: 0 % (ref 0.0–0.2)

## 2019-01-27 LAB — CMP (CANCER CENTER ONLY)
ALT: 21 U/L (ref 0–44)
AST: 19 U/L (ref 15–41)
Albumin: 3.7 g/dL (ref 3.5–5.0)
Alkaline Phosphatase: 114 U/L (ref 38–126)
Anion gap: 10 (ref 5–15)
BUN: 16 mg/dL (ref 8–23)
CO2: 27 mmol/L (ref 22–32)
Calcium: 8.7 mg/dL — ABNORMAL LOW (ref 8.9–10.3)
Chloride: 100 mmol/L (ref 98–111)
Creatinine: 0.82 mg/dL (ref 0.44–1.00)
GFR, Est AFR Am: >60 mL/min (ref >60)
GFR, Estimated: >60 mL/min (ref >60)
Glucose, Bld: 110 mg/dL — ABNORMAL HIGH (ref 70–99)
Potassium: 4.3 mmol/L (ref 3.5–5.1)
Sodium: 137 mmol/L (ref 135–145)
Total Bilirubin: 0.5 mg/dL (ref 0.3–1.2)
Total Protein: 7.1 g/dL (ref 6.5–8.1)

## 2019-01-27 LAB — LACTATE DEHYDROGENASE: LDH: 193 U/L — ABNORMAL HIGH (ref 98–192)

## 2019-01-27 MED ORDER — SODIUM CHLORIDE 0.9% FLUSH
10.0000 mL | Freq: Once | INTRAVENOUS | Status: DC
  Filled 2019-01-27: qty 10

## 2019-01-27 MED ORDER — HEPARIN SOD (PORK) LOCK FLUSH 100 UNIT/ML IV SOLN
500.0000 [IU] | Freq: Once | INTRAVENOUS | Status: AC
  Administered 2019-01-27: 09:00:00 500 [IU] via INTRAVENOUS
  Filled 2019-01-27: qty 5

## 2019-01-27 NOTE — Telephone Encounter (Signed)
Scheduled appt per 6/19 los. Left a voice message of appt date and time

## 2019-02-08 ENCOUNTER — Telehealth: Payer: Self-pay | Admitting: *Deleted

## 2019-02-08 NOTE — Telephone Encounter (Signed)
Patient had first shingles vaccine [the new one] one week ago (has had shingles twice in past). Today developed tingling sensation on right shoulder blade, traveling around under arm and onto left breast. No blistering at this point, just tingling. Started same way shingles has in past. Would like Dr.Kale's advice.

## 2019-02-08 NOTE — Telephone Encounter (Signed)
Advise patient per Dr. Irene Limbo regarding tingling sensation following shingles vaccine: monitor for worsening symptoms. If only reaction to vaccine, should clear/subside. Contact office if worse.  Patient contacted with this information and verbalizerd understanding.

## 2019-02-09 ENCOUNTER — Other Ambulatory Visit: Payer: Self-pay | Admitting: Hematology

## 2019-02-09 ENCOUNTER — Telehealth: Payer: Self-pay | Admitting: *Deleted

## 2019-02-09 ENCOUNTER — Other Ambulatory Visit: Payer: Self-pay | Admitting: Family Medicine

## 2019-02-09 DIAGNOSIS — B029 Zoster without complications: Secondary | ICD-10-CM

## 2019-02-09 MED ORDER — VALACYCLOVIR HCL 1 G PO TABS: 1000.0000 mg | ORAL_TABLET | Freq: Three times a day (TID) | ORAL | 0 refills | Status: AC

## 2019-02-09 MED ORDER — ACYCLOVIR 5 % EX OINT: 1.0000 "application " | TOPICAL_OINTMENT | CUTANEOUS | 0 refills | Status: DC

## 2019-02-09 NOTE — Telephone Encounter (Signed)
Patient left message with on call service nurse that she had developed some red spots in the area of tingling. No blisters and no pain.  Per Dr. Irene Limbo, contact patient with this information - since the office is closed tomorrow, he sent prescriptions to her pharmacy: acyclovir ointment to be used 4-5 times a day over area of rash if needed and if rash worsens and develops into a shingles appearing rash - valacyclovir tablets can be taken as directed.  She can watch and wait and if the rash does not worsen, she does not need either of these. Attempted to contact patient. LVM with information as above.

## 2019-02-09 NOTE — Progress Notes (Signed)
Patient had first shingles vaccine a week ago [the new one]. Wondered if this is related to vaccine. Today, delveloped tingling over right should ber blade, around under right arm and onto right breast. Has had shingles twice in past. This started like shingles in the past, but only tingling and no blisters. Would appreciate your advice had first shingles vaccine a week ago [the new one]. Wondered if this is related to vaccine. Today, delveloped tingling over right should ber blade, around under right arm and onto right breast. Has had shingles twice in past. This started like shingles in the past, but only tingling and no blisters. Would appreciate your advice    Ordered acyclovir ointment to be apply over area of rash 4-5 times a day for 1 week. If she has worsening rash representing shingles she can start taking the Valtrex PO i have sent to her pharmacy and call us or her PCP for evaluation of rash.  Denise [name]

## 2019-02-13 ENCOUNTER — Telehealth: Payer: Self-pay | Admitting: Internal Medicine

## 2019-02-13 MED ORDER — ONDANSETRON HCL 4 MG PO TABS: 4.0000 mg | ORAL_TABLET | Freq: Three times a day (TID) | ORAL | 0 refills | Status: DC | PRN

## 2019-02-13 MED ORDER — HYDROCODONE-ACETAMINOPHEN 10-325 MG PO TABS: 1.0000 | ORAL_TABLET | Freq: Three times a day (TID) | ORAL | 0 refills | Status: AC | PRN

## 2019-02-13 NOTE — Telephone Encounter (Signed)
Pt advised. KH 

## 2019-02-13 NOTE — Addendum Note (Signed)
Addended by: Denita Lung on: 02/13/2019 02:37 PM   Modules accepted: Orders

## 2019-02-13 NOTE — Telephone Encounter (Signed)
I already sent it in

## 2019-02-13 NOTE — Telephone Encounter (Signed)
Pt called back and also wanted to know if she can get something for nausea called in to the same pharmacy.

## 2019-02-13 NOTE — Telephone Encounter (Signed)
Pt would like to know if you are going to send in the Hydrocodone. Please advise [facility]

## 2019-02-13 NOTE — Telephone Encounter (Signed)
Have her use Emetrol for the nausea and if continued difficulty have her set up an appointment to see me

## 2019-02-13 NOTE — Telephone Encounter (Signed)
I called in ondansetron

## 2019-02-13 NOTE — Telephone Encounter (Signed)
Pt called and states she got the shingles shot recently and then developed shingles Wednesday. Her oncologist prescribed her valtrex for the shingles but she is wanting hydrocodone 5-325mg  for the pain. She has it on her back on right side and spot on her right breast.  Send to [facility]- [address]

## 2019-02-13 NOTE — Telephone Encounter (Signed)
Spoke to pt and she advised that she is out of the [employer]. Pt took some pepto. Please advise . [place]

## 2019-02-25 ENCOUNTER — Other Ambulatory Visit: Payer: Self-pay | Admitting: Family Medicine

## 2019-02-27 NOTE — Telephone Encounter (Signed)
[facility] is requesting to fill pt meloxicam. Please advise [facility] 

## 2019-03-08 LAB — HM MAMMOGRAPHY

## 2019-03-09 ENCOUNTER — Other Ambulatory Visit: Payer: Self-pay | Admitting: Family Medicine

## 2019-03-11 ENCOUNTER — Other Ambulatory Visit: Payer: Self-pay | Admitting: Family Medicine

## 2019-03-11 DIAGNOSIS — Z638 Other specified problems related to primary support group: Secondary | ICD-10-CM

## 2019-03-13 NOTE — Telephone Encounter (Signed)
[facility] is requesting to fill pt xanax . Please advise [facility] 

## 2019-04-07 ENCOUNTER — Other Ambulatory Visit: Payer: Self-pay | Admitting: Family Medicine

## 2019-04-27 ENCOUNTER — Telehealth: Payer: Self-pay | Admitting: Internal Medicine

## 2019-04-27 ENCOUNTER — Other Ambulatory Visit: Payer: Self-pay | Admitting: Family Medicine

## 2019-04-27 NOTE — Telephone Encounter (Signed)
Pt called and states that she was told she needs the high dose flu shot since she had cancer. Pharmacy will not get one without a script

## 2019-04-27 NOTE — Telephone Encounter (Signed)
Pt is coming in 05-05-19. [place]

## 2019-04-27 NOTE — Telephone Encounter (Signed)
She can come by here and we will give her a shot or I can write a prescription

## 2019-05-04 ENCOUNTER — Other Ambulatory Visit: Payer: Self-pay | Admitting: Family Medicine

## 2019-05-04 NOTE — Telephone Encounter (Signed)
Is this okay to refill? 

## 2019-05-05 ENCOUNTER — Other Ambulatory Visit: Payer: Self-pay

## 2019-05-05 ENCOUNTER — Other Ambulatory Visit (INDEPENDENT_AMBULATORY_CARE_PROVIDER_SITE_OTHER): Payer: BC Managed Care – PPO

## 2019-05-05 DIAGNOSIS — Z23 Encounter for immunization: Secondary | ICD-10-CM

## 2019-05-07 ENCOUNTER — Other Ambulatory Visit: Payer: Self-pay | Admitting: Family Medicine

## 2019-05-31 ENCOUNTER — Other Ambulatory Visit: Payer: Self-pay | Admitting: Family Medicine

## 2019-06-12 ENCOUNTER — Other Ambulatory Visit: Payer: Self-pay | Admitting: Family Medicine

## 2019-06-12 NOTE — Telephone Encounter (Signed)
Pt. Is requesting a refill on her Pro Air inhaler to [facility] on [address]. Pt. Last seen 07/22/18

## 2019-06-20 ENCOUNTER — Telehealth: Payer: Self-pay | Admitting: Family Medicine

## 2019-06-20 DIAGNOSIS — J4530 Mild persistent asthma, uncomplicated: Secondary | ICD-10-CM

## 2019-06-20 MED ORDER — ALBUTEROL SULFATE HFA 108 (90 BASE) MCG/ACT IN AERS: 2.0000 | INHALATION_SPRAY | Freq: Four times a day (QID) | RESPIRATORY_TRACT | 0 refills | Status: DC | PRN

## 2019-06-20 NOTE — Telephone Encounter (Signed)
Pt left message stating she needs a new prescription for her pro air inhaler sent to the [facility] on [address]

## 2019-06-29 ENCOUNTER — Other Ambulatory Visit: Payer: Self-pay | Admitting: Family Medicine

## 2019-07-09 ENCOUNTER — Other Ambulatory Visit: Payer: Self-pay | Admitting: Family Medicine

## 2019-07-09 DIAGNOSIS — Z638 Other specified problems related to primary support group: Secondary | ICD-10-CM

## 2019-07-10 ENCOUNTER — Telehealth: Payer: Self-pay

## 2019-07-10 ENCOUNTER — Ambulatory Visit (INDEPENDENT_AMBULATORY_CARE_PROVIDER_SITE_OTHER): Payer: BC Managed Care – PPO | Admitting: Family Medicine

## 2019-07-10 ENCOUNTER — Encounter: Payer: Self-pay | Admitting: Family Medicine

## 2019-07-10 ENCOUNTER — Other Ambulatory Visit: Payer: Self-pay

## 2019-07-10 VITALS — BP 138/80 | HR 55 | Temp 98.0°F | Wt 206.8 lb

## 2019-07-10 DIAGNOSIS — I493 Ventricular premature depolarization: Secondary | ICD-10-CM

## 2019-07-10 DIAGNOSIS — E785 Hyperlipidemia, unspecified: Secondary | ICD-10-CM

## 2019-07-10 DIAGNOSIS — Z8249 Family history of ischemic heart disease and other diseases of the circulatory system: Secondary | ICD-10-CM

## 2019-07-10 DIAGNOSIS — J301 Allergic rhinitis due to pollen: Secondary | ICD-10-CM

## 2019-07-10 DIAGNOSIS — M1990 Unspecified osteoarthritis, unspecified site: Secondary | ICD-10-CM

## 2019-07-10 DIAGNOSIS — C8478 Anaplastic large cell lymphoma, ALK-negative, lymph nodes of multiple sites: Secondary | ICD-10-CM

## 2019-07-10 DIAGNOSIS — Z9484 Stem cells transplant status: Secondary | ICD-10-CM

## 2019-07-10 DIAGNOSIS — K219 Gastro-esophageal reflux disease without esophagitis: Secondary | ICD-10-CM | POA: Diagnosis not present

## 2019-07-10 DIAGNOSIS — G4730 Sleep apnea, unspecified: Secondary | ICD-10-CM

## 2019-07-10 DIAGNOSIS — J4530 Mild persistent asthma, uncomplicated: Secondary | ICD-10-CM

## 2019-07-10 DIAGNOSIS — R739 Hyperglycemia, unspecified: Secondary | ICD-10-CM | POA: Diagnosis not present

## 2019-07-10 DIAGNOSIS — E669 Obesity, unspecified: Secondary | ICD-10-CM

## 2019-07-10 LAB — POCT GLYCOSYLATED HEMOGLOBIN (HGB A1C): Hemoglobin A1C: 6.6 % — AB (ref 4.0–5.6)

## 2019-07-10 NOTE — Patient Instructions (Signed)
20 minutes of something physical daily or 150 minutes a week of something Cut back on carbohydrates and the easiest way to remember is "white food"

## 2019-07-10 NOTE — Telephone Encounter (Signed)
Pt. Called back to let us know that she has only 1 xanax left and would be needing a refill on it to [facility] on [address]. Pt. Was told to call back.

## 2019-07-10 NOTE — Telephone Encounter (Signed)
[facility] is requesting to fill pt xanax and triazolam. Please advise [facility]

## 2019-07-10 NOTE — Progress Notes (Signed)
   Subjective:    Patient ID: Denise [name], female    DOB: [birth year], 63 y.o.   MRN: [id number]  HPI She is here for an interval evaluation.  She gets regular follow-up concerning her stem cell transplant and underlying history of lymphoma.  She is seen both here and in [place].  She uses Mobic daily for control of her arthritis type symptoms.  Her allergies seem to be under good control.  She rarely needs to use her asthma med.  She is now using a PPI only twice per week to control her reflux symptoms.  She does have underlying OSA but has not been using her CPAP.  She does use a sleep med and does also use Xanax for anxiety symptoms.  Review of the record indicates that while she was getting chemotherapy her blood sugars did go up.  Most recent hemoglobin A1c is look pretty good.  She has a bicycle and is now starting to use this regularly.  Her weight has been fairly stable.  She does have a history of PVCs but has not had any difficulty with that.   Review of Systems  All other systems reviewed and are negative.      Objective:   Physical Exam Alert and in no distress. Tympanic membranes and canals are normal. Pharyngeal area is normal. Neck is supple without adenopathy or thyromegaly. Cardiac exam shows a regular sinus rhythm without murmurs or gallops. Lungs are clear to auscultation. 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  Family history of heart disease in female family member before age 70: May place her on a statin pending results.  Obesity (BMI 30-39.9)  Hyperglycemia: I discussed the diagnosis of diabetes with her and will hold off making that call.  Strongly encouraged her to make diet and exercise changes and I will follow up on this in about 4 months.  Gastroesophageal  reflux disease without esophagitis: Continue to wean off of the PPI. Arthritis: Recommend Tylenol first for her pain and then use the Mobic if needed.  PVC's (premature ventricular contractions)  Sleep apnea, unspecified type: Strongly encouraged her to use the CPAP again and have them send me results.  Explained the fact that she needs to use this at least 80% of time and get least 4 hours of benefit per night before we can really truly decide the benefit.  Allergic rhinitis due to pollen, unspecified seasonality  Mild persistent extrinsic asthma without complication  Hyperlipidemia, unspecified hyperlipidemia type - Plan: Lipid panel  I discussed the use of the sleep med for the OSA and also strongly encouraged her to minimize use of Xanax and only use it on an as-needed basis rather than on an anticipated need. Over 45 minutes, greater than 50% spent in counseling and coordination of care

## 2019-07-10 NOTE — Addendum Note (Signed)
Addended by: Elyse Jarvis on: 07/10/2019 02:38 PM   Modules accepted: Orders

## 2019-07-11 LAB — LIPID PANEL
Chol/HDL Ratio: 2.7 ratio (ref 0.0–4.4)
Cholesterol, Total: 198 mg/dL (ref 100–199)
HDL: 74 mg/dL (ref >39)
LDL Chol Calc (NIH): 109 mg/dL — ABNORMAL HIGH (ref 0–99)
Triglycerides: 85 mg/dL (ref 0–149)
VLDL Cholesterol Cal: 15 mg/dL (ref 5–40)

## 2019-07-16 ENCOUNTER — Other Ambulatory Visit: Payer: Self-pay | Admitting: Family Medicine

## 2019-07-16 DIAGNOSIS — J4530 Mild persistent asthma, uncomplicated: Secondary | ICD-10-CM

## 2019-07-17 NOTE — Telephone Encounter (Signed)
[facility] is requesting to fill pt ventolin inhaler. Please advise KH °

## 2019-07-27 ENCOUNTER — Other Ambulatory Visit: Payer: Self-pay | Admitting: Family Medicine

## 2019-07-28 ENCOUNTER — Inpatient Hospital Stay: Payer: BC Managed Care – PPO | Attending: Hematology

## 2019-07-28 ENCOUNTER — Telehealth: Payer: Self-pay | Admitting: Hematology

## 2019-07-28 ENCOUNTER — Inpatient Hospital Stay (HOSPITAL_BASED_OUTPATIENT_CLINIC_OR_DEPARTMENT_OTHER): Payer: BC Managed Care – PPO | Admitting: Hematology

## 2019-07-28 ENCOUNTER — Other Ambulatory Visit: Payer: BC Managed Care – PPO

## 2019-07-28 ENCOUNTER — Other Ambulatory Visit: Payer: Self-pay

## 2019-07-28 VITALS — BP 123/70 | HR 56 | Temp 97.8°F | Resp 18 | Ht 64.0 in | Wt 204.3 lb

## 2019-07-28 DIAGNOSIS — F419 Anxiety disorder, unspecified: Secondary | ICD-10-CM | POA: Diagnosis not present

## 2019-07-28 DIAGNOSIS — C8448 Peripheral T-cell lymphoma, not classified, lymph nodes of multiple sites: Secondary | ICD-10-CM | POA: Insufficient documentation

## 2019-07-28 DIAGNOSIS — C8478 Anaplastic large cell lymphoma, ALK-negative, lymph nodes of multiple sites: Secondary | ICD-10-CM

## 2019-07-28 DIAGNOSIS — G4730 Sleep apnea, unspecified: Secondary | ICD-10-CM | POA: Insufficient documentation

## 2019-07-28 DIAGNOSIS — Z95828 Presence of other vascular implants and grafts: Secondary | ICD-10-CM

## 2019-07-28 LAB — CBC WITH DIFFERENTIAL/PLATELET
Abs Immature Granulocytes: 0.01 10*3/uL (ref 0.00–0.07)
Basophils Absolute: 0 10*3/uL (ref 0.0–0.1)
Basophils Relative: 1 %
Eosinophils Absolute: 0.1 10*3/uL (ref 0.0–0.5)
Eosinophils Relative: 2 %
HCT: 40.6 % (ref 36.0–46.0)
Hemoglobin: 13.4 g/dL (ref 12.0–15.0)
Immature Granulocytes: 0 %
Lymphocytes Relative: 27 %
Lymphs Abs: 1.8 10*3/uL (ref 0.7–4.0)
MCH: 28.8 pg (ref 26.0–34.0)
MCHC: 33 g/dL (ref 30.0–36.0)
MCV: 87.3 fL (ref 80.0–100.0)
Monocytes Absolute: 0.6 10*3/uL (ref 0.1–1.0)
Monocytes Relative: 9 %
Neutro Abs: 4.2 10*3/uL (ref 1.7–7.7)
Neutrophils Relative %: 61 %
Platelets: 160 10*3/uL (ref 150–400)
RBC: 4.65 MIL/uL (ref 3.87–5.11)
RDW: 13.6 % (ref 11.5–15.5)
WBC: 6.7 10*3/uL (ref 4.0–10.5)
nRBC: 0 % (ref 0.0–0.2)

## 2019-07-28 LAB — CMP (CANCER CENTER ONLY)
ALT: 21 U/L (ref 0–44)
AST: 22 U/L (ref 15–41)
Albumin: 3.8 g/dL (ref 3.5–5.0)
Alkaline Phosphatase: 104 U/L (ref 38–126)
Anion gap: 8 (ref 5–15)
BUN: 12 mg/dL (ref 8–23)
CO2: 29 mmol/L (ref 22–32)
Calcium: 8.9 mg/dL (ref 8.9–10.3)
Chloride: 101 mmol/L (ref 98–111)
Creatinine: 0.8 mg/dL (ref 0.44–1.00)
GFR, Est AFR Am: >60 mL/min (ref >60)
GFR, Estimated: >60 mL/min (ref >60)
Glucose, Bld: 108 mg/dL — ABNORMAL HIGH (ref 70–99)
Potassium: 4.4 mmol/L (ref 3.5–5.1)
Sodium: 138 mmol/L (ref 135–145)
Total Bilirubin: 0.5 mg/dL (ref 0.3–1.2)
Total Protein: 6.9 g/dL (ref 6.5–8.1)

## 2019-07-28 LAB — LACTATE DEHYDROGENASE: LDH: 198 U/L — ABNORMAL HIGH (ref 98–192)

## 2019-07-28 NOTE — Progress Notes (Signed)
HEMATOLOGY/ONCOLOGY CLINIC NOTE  Date of Service: 07/28/19     Patient Care Team: Denita Lung, MD as PCP - General (Family Medicine) Dr. 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 Valtrex resolved the shingles but she continued to have progressive adenopathy, cough and an unintentional weight loss. On 10/15/2016, a CT scan showed bulky adenopathy in the supraclavicular, mediastinum, hilar and abdomen. On 10/21/16, an ultrasound guided core biopsy of the left supraclavicular lymph node showed a T cell lymphoproliferative process with the differential diagnosis most suggestive of a CD30 positive PTCL, NOS or ALK negative anaplastic lymphoma. A PET scan on 10/27/16, redemonstrated bulky hypermetabolic lymphadenopathy throughout the body and a hypermetabolic mass in the central lung with multiple nodules and bilateral pleural effusions. The highest SUV was in the left para-aortic region at 21.4.   In early April 2018, she started chemotherapy with CHOEP. Interim scans showed nearly completely resolved adenopathy, [place] 1. Completed 5 cycles with CR1 prior to transplant.  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 Hydration and BCNU 300 mg/m2 in 500 ml D5W over 2 hours today. 08/30 - Day -5.  Cytarabine 200 mg/m2 in 250 ml NS over 1 hour and etoposide 200 mg/m2 in 1 L NS over 3 hours. 08/31 - Day -4. Cytarabine 200 mg/m2 in 250 ml NS over 1 hour and etoposide 200 mg/m2 in 1 L NS over 3 hours. 09/01 - Day -3. Cytarabine 200 mg/m2 in 250 ml NS over 1 hour and etoposide 200 mg/m2 in 1 L NS over 3 hours. 09/02 - Day -2. Cytarabine 200 mg/m2 in 250 ml NS over 1 hour and etoposide 200 mg/m2 in 1 L NS over 3 hours. 09/03 - Day -1. Melphalan 140 mg/m2 in 100 ml NS over 30 minutes.   04/13/2017 Transplant  Outpatient Autologous stem cell transplant - Day 0 on 04/13/2017  ASBMT risk stratification: low risk ECOG/KPS: 1/Able to carry on normal activities; minor signs/symptoms of disease HSCT-CI score: CR1 Preparative regimen/treatment protocol: BEAM  Engraftment:  WBC on 04/23/2017 with WBC 1.8 and ANC 1.1 Platelets: Anticipated for 9/21 - last platelet transfusion on 1/56/1537   04/14/3275 Complications  Transplant complications: Cx negative neutropenic fever - admitted to inpatient service and started on broad spectrum Zosyn which she continued until WBC engraftment on 04/23/2017  Mucositis: Grade II - managed with MMW    INTERVAL HISTORY  Denise [name] is here for her scheduled follow-up for her 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 The pt completed CHOEP followed by a preparative regimen for transplant April 2018-04/12/17. Autologous transplant was 04/13/17. The patient's last visit with [name] was on 01/27/2019. The pt reports that she is doing well overall.  The pt reports she has some slight pain on the side of her left breast. No rash. No redness or acute tenderness.  She had her last port removed in September 2020.  Lab results today (07/28/19) of CBC w/diff and CMP is as follows: all values are WNL except for Glucose Bld at 108, LDH at 198 .  On review of systems, pt reports no new concerns and denies, mouth sores, back pain, abdominal pain, leg swelling and any other  symptoms.   MEDICAL HISTORY:  Past Medical History:  Diagnosis Date  . Allergy   . Asthma   . Bursitis of left hip   . Fibroids    TAH in 1999  . GERD (gastroesophageal reflux disease)   . Lymphoma ([place])    T cell lymphoma  . PVC's (premature ventricular contractions)   . Sleep apnea    does not use CPAP    SURGICAL HISTORY: Past Surgical History:  Procedure Laterality Date  . CARDIAC CATH  05/2007   J. BERRY  . HAMMER TOE SURGERY Bilateral 2016  . IR GENERIC HISTORICAL  11/05/2016   IR US GUIDE VASC ACCESS RIGHT 11/05/2016 [name], MD [facility]-INTER[facility] RAD  . IR GENERIC HISTORICAL  11/05/2016   IR FLUORO GUIDE PORT INSERTION RIGHT 11/05/2016 [name], MD [facility]INTER[facility]  . TOTAL ABDOMINAL HYSTERECTOMY  05/15/1998   secondary to fibroids  . WISDOM TOOTH EXTRACTION  mid teens    SOCIAL HISTORY: Social History   Socioeconomic History  . Marital status: Significant Other    Spouse name: Not on file  . Number of children: 0  . Years of education: Not on file  . Highest education level: Not on file  Occupational History  . 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[employer]: [employer]  Tobacco Use  . Smoking status: Never Smoker  . Smokeless tobacco: Never Used  Substance and Sexual Activity  . Alcohol use: No  . Drug use: No  . Sexual activity: Yes    Partners: Female    Birth control/protection: None  Other Topics Concern  . Not on file  Social History Narrative  . Not on file   Social Determinants of Health   Financial Resource Strain:   . Difficulty of Paying Living Expenses: Not on file  Food Insecurity:   . Worried About Charity fundraiser in the Last Year: Not on file  . Ran Out of Food in the Last Year: Not on file  Transportation Needs:   . Lack of Transportation (Medical): Not on file  . Lack of Transportation (Non-Medical): Not on file  Physical Activity:   . Days of Exercise per Week: Not on file  . Minutes of Exercise  per Session: Not on file  Stress:   . Feeling of Stress : Not on file  Social Connections:   . Frequency of Communication with Friends and Family: Not on file  . Frequency of Social Gatherings with Friends and Family: Not on file  . Attends Religious Services: Not on file  . Active Member of Clubs or Organizations: Not on file  . Attends Archivist Meetings: Not on file  . Marital Status: Not on file  Intimate Partner Violence:   . Fear of Current or Ex-Partner: Not on file  . Emotionally Abused: Not on file  . Physically Abused: Not on file  . Sexually Abused: Not on file    FAMILY HISTORY: Family History  Problem Relation Age of Onset  . Cancer Maternal Grandmother        GI cancer in 65's  . Dementia Mother   .  Heart disease Father   . Heart failure Father     ALLERGIES:  is allergic to morphine and related.  MEDICATIONS:  Current Outpatient Medications  Medication Sig Dispense Refill  . acyclovir ointment (ZOVIRAX) 5 % Apply 1 application topically every 4 (four) hours. To lesions over chest wall (Patient not taking: Reported on 07/10/2019) 15 g 0  . ADVAIR DISKUS 100-50 MCG/DOSE AEPB INHALE 1 PUFF INTO THE LUNGS DAILY AS NEEDED FOR SHORTNESS OF BREATH OR WHEEZING (Patient not taking: Reported on 09/29/2018) 3 each 3  . albuterol (VENTOLIN HFA) 108 (90 Base) MCG/ACT inhaler INHALE 2 PUFFS INTO THE LUNGS EVERY 6 HOURS AS NEEDED FOR WHEEZING OR SHORTNESS OF BREATH 18 g 0  . ALPRAZolam (XANAX) 0.25 MG tablet TAKE 1 TABLET BY MOUTH TWICE DAILY AS NEEDED 50 tablet 0  . aspirin 81 MG chewable tablet Chew 81 mg by mouth daily as needed for moderate pain.     Marland Kitchen atenolol (TENORMIN) 50 MG tablet TAKE 1 TABLET BY MOUTH TWICE DAILY 180 tablet 1  . esomeprazole (NEXIUM) 40 MG capsule TAKE 1 CAPSULE(40 MG) BY MOUTH DAILY BEFORE BREAKFAST 90 capsule 1  . fluticasone (FLONASE) 50 MCG/ACT nasal spray Place into the nose.    . hydrochlorothiazide (MICROZIDE) 12.5 MG capsule TAKE 1  CAPSULE BY MOUTH EVERY DAY 90 capsule 0  . meloxicam (MOBIC) 15 MG tablet TAKE 1 TABLET BY MOUTH EVERY DAY AS NEEDED FOR MUSCLE SPASMS 90 tablet 1  . ondansetron (ZOFRAN) 4 MG tablet TAKE 1 TABLET(4 MG) BY MOUTH EVERY 8 HOURS AS NEEDED FOR NAUSEA OR VOMITING 12 tablet 0  . triazolam (HALCION) 0.25 MG tablet TAKE 1 TABLET(0.25 MG) BY MOUTH AT BEDTIME AS NEEDED FOR SLEEP 90 tablet 1  . WIXELA INHUB 100-50 MCG/DOSE AEPB INHALE 1 PUFF INTO THE LUNGS TWICE DAILY 60 each 5   No current facility-administered medications for this visit.    REVIEW OF SYSTEMS:   A 10+ POINT REVIEW OF SYSTEMS WAS OBTAINED including neurology, dermatology, psychiatry, cardiac, respiratory, lymph, extremities, GI, GU, Musculoskeletal, constitutional, breasts, reproductive, HEENT.  All pertinent positives are noted in the HPI.  All others are negative.     PHYSICAL EXAMINATION:  ECOG FS:0 - Asymptomatic  Vitals:   07/28/19 0948  BP: 123/70  Pulse: (!) 56  Resp: 18  Temp: 97.8 F (36.6 C)  SpO2: 100%   Wt Readings from Last 3 Encounters:  07/28/19 204 lb 4.8 oz (92.7 kg)  07/10/19 206 lb 12.8 oz (93.8 kg)  01/27/19 203 lb 11.2 oz (92.4 kg)   Body mass index is 35.07 kg/m.    GENERAL:alert, in no acute distress and comfortable SKIN: no acute rashes, no significant lesions EYES: conjunctiva are pink and non-injected, sclera anicteric OROPHARYNX: MMM, no exudates, no oropharyngeal erythema or ulceration NECK: supple, no JVD LYMPH:  no palpable lymphadenopathy in the cervical, axillary or inguinal regions LUNGS: clear to auscultation b/l with normal respiratory effort HEART: regular rate & rhythm ABDOMEN:  normoactive bowel sounds , non tender, not distended. Extremity: no pedal edema PSYCH: alert & oriented x 3 with fluent speech NEURO: no focal motor/sensory deficits   LABORATORY DATA:  I have reviewed the data as listed  . CBC Latest Ref Rng & Units 07/28/2019 01/27/2019 09/28/2018  WBC 4.0 - 10.5  K/uL 6.7 6.2 6.9  Hemoglobin 12.0 - 15.0 g/dL 13.4 13.2 13.2  Hematocrit 36.0 - 46.0 % 40.6 40.3 38.7  Platelets 150 - 400 K/uL 160 147(L) 143(L)   .  CMP Latest Ref Rng & Units 07/28/2019 01/27/2019 09/28/2018  Glucose 70 - 99 mg/dL 108(H) 110(H) 112(H)  BUN 8 - 23 mg/dL '12 16 13  ' Creatinine 0.44 - 1.00 mg/dL 0.80 0.82 0.83  Sodium 135 - 145 mmol/L 138 137 140  Potassium 3.5 - 5.1 mmol/L 4.4 4.3 3.8  Chloride 98 - 111 mmol/L 101 100 104  CO2 22 - 32 mmol/L '29 27 27  ' Calcium 8.9 - 10.3 mg/dL 8.9 8.7(L) 8.8(L)  Total Protein 6.5 - 8.1 g/dL 6.9 7.1 6.9  Total Bilirubin 0.3 - 1.2 mg/dL 0.5 0.5 0.3  Alkaline Phos 38 - 126 U/L 104 114 125  AST 15 - 41 U/L '22 19 17  ' ALT 0 - 44 U/L '21 21 17         ' RADIOGRAPHIC STUDIES: I have personally reviewed the radiological images as listed and agreed with the findings in the report. No results found.   PET Lymphoma Hodgkins (with/ low dose CT) , 07/22/17 IMPRESSION: 1. Mildly hypermetabolic bilateral pulmonary airspace opacities that are favored to represent infection versus complications of stem cell transplant. Follow-up with diagnostic CT may be helpful to distinguish. 2. No FDG avid malignant disease at present. Ancillary CT findings as above.  04/14/18 CT C/A/P and NECK:        10/13/18 CT C/A/P and Neck:      ASSESSMENT & PLAN:   63 y.o. 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   PET/CT scan shows bulky hypermetabolic lymphadenopathy throughout the neck, chest, and abdomen. 1 cm hypermetabolic pelvic lymph node in right external iliac chain. Hypermetabolic masslike opacity in central right lung with multiple adjacent hypermetabolic nodules. This is suspicious for pulmonary involvement by lymphoma  Fatigue anorexia or night sweats-drenching, and weight loss suggest constitutional type B symptoms.  Hepatitis profile and HIV  neg  Patient is s/p 5 cycles of CHOEP tolerated well overall and received CHOP for 6th cycle due to significant cytopenias after 5th cycle of CHOEP. PET/CT on 01/08/2017 - showed near completely resolved LNadenopathy.  She received her autologous stem cell transplant with Dr Duanne Moron at [facility] on 04/13/2017 which she has tolerated well and has recovered well with resolved cytopenias.  -PET scan on 07/22/2017 with results of: No FDG avid malignant disease at present.  04/14/18 CT C/A/P and Neck revealed No evidence of disease return of progression    #2 history of PVCs -On chronic beta blocker therapy.  #3 history of sleep apnea -continue CPAP use  #4 severe anxiety - this has improved   PLAN: -Discussed pt labwork today, 07/28/19; all values are WNL except for Glucose Bld at 108, LDH WNL at 198 -Discussed 07/28/19 platelets at 160 -Discussed 07/28/19 Glucose Bld at 108. -Discussed her follow up with Dr. Jolayne Haines -patient has no lab or clinical evidence of lymphoma recurrence at this time. -Will see back in 6 months  FOLLOW UP: RTC with Dr Irene Limbo with labs in 6 months  The total time spent in the appt was 20 minutes and more than 50% was on counseling and direct patient cares.  All of the patient's questions were answered with apparent satisfaction. The patient knows to call the clinic with any problems, questions or concerns.     Sullivan Lone MD [place] AAHIVMS [facility] Hematology/Oncology Physician [facility]  (Office):       [phone] (Work cell):  [phone] (Fax):  [phone]  I, Scot Dock, am acting as a scribe for Dr. Sullivan Lone.   .I have reviewed the above documentation for accuracy and completeness, and I agree with the above. Brunetta Genera MD

## 2019-07-28 NOTE — Telephone Encounter (Signed)
Scheduled appt per 12/18 los.  Spoke with pt and they are aware of the appt date and time.

## 2019-07-31 ENCOUNTER — Encounter: Payer: Self-pay | Admitting: Hematology

## 2019-08-10 ENCOUNTER — Other Ambulatory Visit: Payer: Self-pay | Admitting: Family Medicine

## 2019-08-10 DIAGNOSIS — J4530 Mild persistent asthma, uncomplicated: Secondary | ICD-10-CM

## 2019-08-28 ENCOUNTER — Other Ambulatory Visit: Payer: Self-pay | Admitting: Family Medicine

## 2019-08-28 NOTE — Telephone Encounter (Signed)
Is this okay to refill? 

## 2019-09-04 ENCOUNTER — Other Ambulatory Visit: Payer: Self-pay | Admitting: Family Medicine

## 2019-09-04 DIAGNOSIS — Z638 Other specified problems related to primary support group: Secondary | ICD-10-CM

## 2019-09-04 NOTE — Telephone Encounter (Signed)
[facility] is requesting to fill pt xanax . Please advise [facility] 

## 2019-09-25 ENCOUNTER — Other Ambulatory Visit: Payer: Self-pay | Admitting: Family Medicine

## 2019-09-25 NOTE — Telephone Encounter (Signed)
Pt states she is coming off nexium and is making her stomach sensitive and would like this refilled

## 2019-10-11 ENCOUNTER — Telehealth: Payer: Self-pay | Admitting: *Deleted

## 2019-10-11 NOTE — Telephone Encounter (Signed)
Patient called - wants to know if ok for her to take probiotics for digestion. Dr. Irene Limbo informed. Dr. Irene Limbo response: yes that would be okay... she is out a fairly long time post transplant Contacted patient with Dr. Grier Mitts response - she verbalized understanding

## 2019-10-12 ENCOUNTER — Other Ambulatory Visit: Payer: Self-pay

## 2019-10-12 ENCOUNTER — Encounter: Payer: Self-pay | Admitting: Family Medicine

## 2019-10-12 ENCOUNTER — Ambulatory Visit: Payer: BC Managed Care – PPO | Admitting: Family Medicine

## 2019-10-12 VITALS — Temp 97.4°F | Wt 194.0 lb

## 2019-10-12 DIAGNOSIS — R1110 Vomiting, unspecified: Secondary | ICD-10-CM

## 2019-10-12 DIAGNOSIS — R197 Diarrhea, unspecified: Secondary | ICD-10-CM

## 2019-10-12 NOTE — Progress Notes (Signed)
   Subjective:    Patient ID: Denise [name], female    DOB: October 16, 1955, 64 y.o.   MRN: [id number]  HPI Documentation for virtual telephone encounter.  Documentation for virtual audio and video telecommunications through [place] encounter:  The patient was located at home. The provider was located in the office. The patient did consent to this visit and is aware of possible charges through their insurance for this visit. The other persons participating in this telemedicine service were none. This virtual service is not related to other E/M service within previous 7 days. She states that she has a 1 year history of difficulty with intermittent vomiting and diarrhea.  This usually occurs in the evening.  She states that she has had 3 or 4 days within the last year.  She cannot relate this to anything other than when she lies down.  She has not kept a record of any foods that might be doing this, stress.  She does have a history of reflux disease and is trying to cut down on the use of Nexium.  Those symptoms are mainly acid reflux type symptoms and she cannot relate these to the vomiting and diarrhea.  Review of Systems     Objective:   Physical Exam Alert and in no distress otherwise not examined       Assessment & Plan:  Vomiting and diarrhea I explained that at this time there is no clear reason for this and they are not occurring with any increased frequency.  She will start on a probiotic and keep track to see if there is a pattern to this especially in regard to eating and the types of foods.  She was comfortable with that.

## 2019-10-31 ENCOUNTER — Other Ambulatory Visit: Payer: Self-pay | Admitting: Family Medicine

## 2019-10-31 DIAGNOSIS — Z638 Other specified problems related to primary support group: Secondary | ICD-10-CM

## 2019-10-31 NOTE — Telephone Encounter (Signed)
Pt. Does not have a future apt. And is requesting refills on these medications is ok to refill or does she need to be scheduled first?

## 2019-10-31 NOTE — Telephone Encounter (Signed)
Have her set up a virtual visit so we can talk about her sleep issues and anxiety.

## 2019-11-01 NOTE — Telephone Encounter (Signed)
I will discuss this with her when she comes in

## 2019-11-01 NOTE — Telephone Encounter (Signed)
Pt appt was set up for Monday . Please advise if xanax will be filled. [place]

## 2019-11-02 ENCOUNTER — Other Ambulatory Visit: Payer: Self-pay | Admitting: Family Medicine

## 2019-11-02 NOTE — Telephone Encounter (Signed)
[facility] is requesting to fill pt wixela. Please advise [facility]

## 2019-11-06 ENCOUNTER — Other Ambulatory Visit: Payer: Self-pay

## 2019-11-06 ENCOUNTER — Ambulatory Visit: Payer: BC Managed Care – PPO | Admitting: Family Medicine

## 2019-11-06 ENCOUNTER — Encounter: Payer: Self-pay | Admitting: Family Medicine

## 2019-11-06 VITALS — Temp 97.7°F | Wt 190.0 lb

## 2019-11-06 DIAGNOSIS — K219 Gastro-esophageal reflux disease without esophagitis: Secondary | ICD-10-CM | POA: Diagnosis not present

## 2019-11-06 DIAGNOSIS — E669 Obesity, unspecified: Secondary | ICD-10-CM

## 2019-11-06 DIAGNOSIS — Z638 Other specified problems related to primary support group: Secondary | ICD-10-CM

## 2019-11-06 MED ORDER — ALPRAZOLAM 0.25 MG PO TABS: 0.2500 mg | ORAL_TABLET | Freq: Two times a day (BID) | ORAL | 0 refills | Status: DC | PRN

## 2019-11-06 NOTE — Progress Notes (Signed)
   Subjective:    Patient ID: Denise [name], female    DOB: 1956-07-15, 64 y.o.   MRN: [id number]  HPI  Documentation for virtual audio and video telecommunications through [place] encounter:  The patient was located at home. The provider was located in the office. The patient did consent to this visit and is aware of possible charges through their insurance for this visit. The other persons participating in this telemedicine service were none. Time spent on call was 10 minutes and in review of previous records >15 minutes total. This virtual service is not related to other E/M service within previous 7 days. She states that she has not used Nexium in 3 weeks for treatment of her reflux and is very happy with this.  She has made dietary changes as well as increased her physical activity has lost 10 pounds.  She continues to have anxiety dealing with basic life stresses, dealing with Covid, dealing with her mother etc.  She is using roughly 2/day.    Review of Systems     Objective:   Physical Exam Alert and in no distress otherwise not examined       Assessment & Plan:  Obesity (BMI 30-39.9)  Stress due to family tension - Plan: ALPRAZolam (XANAX) 0.25 MG tablet  Gastroesophageal reflux disease without esophagitis I congratulated her on her weight loss and encouraged her to continue with this as well as her physical activity and dietary changes.  She and her life partner have a goal in mind in terms of weight loss and plan to reward themselves with something noncaloric at that time. I then discussed stress and anxiety.  Explained that she was not certainly abusing the medication but probably allowing various life stresses to affect her more than she should.  Discussed the use of the serenity prayer to put all of this in proper perspective.

## 2020-01-17 ENCOUNTER — Other Ambulatory Visit: Payer: Self-pay | Admitting: Family Medicine

## 2020-01-17 NOTE — Telephone Encounter (Signed)
[facility] is requesting to fill pt ondansetron. Please advise [facility]

## 2020-01-27 ENCOUNTER — Other Ambulatory Visit: Payer: Self-pay | Admitting: Family Medicine

## 2020-01-29 ENCOUNTER — Other Ambulatory Visit: Payer: Self-pay

## 2020-01-29 ENCOUNTER — Inpatient Hospital Stay: Payer: BC Managed Care – PPO | Attending: Hematology

## 2020-01-29 ENCOUNTER — Telehealth: Payer: Self-pay | Admitting: *Deleted

## 2020-01-29 ENCOUNTER — Inpatient Hospital Stay (HOSPITAL_BASED_OUTPATIENT_CLINIC_OR_DEPARTMENT_OTHER): Payer: BC Managed Care – PPO | Admitting: Hematology

## 2020-01-29 VITALS — BP 155/53 | HR 55 | Temp 97.7°F | Resp 18 | Ht 64.0 in | Wt 190.0 lb

## 2020-01-29 DIAGNOSIS — Z8572 Personal history of non-Hodgkin lymphomas: Secondary | ICD-10-CM | POA: Diagnosis present

## 2020-01-29 DIAGNOSIS — C8478 Anaplastic large cell lymphoma, ALK-negative, lymph nodes of multiple sites: Secondary | ICD-10-CM

## 2020-01-29 LAB — CBC WITH DIFFERENTIAL/PLATELET
Abs Immature Granulocytes: 0.01 10*3/uL (ref 0.00–0.07)
Basophils Absolute: 0.1 10*3/uL (ref 0.0–0.1)
Basophils Relative: 1 %
Eosinophils Absolute: 0.1 10*3/uL (ref 0.0–0.5)
Eosinophils Relative: 1 %
HCT: 40 % (ref 36.0–46.0)
Hemoglobin: 13.3 g/dL (ref 12.0–15.0)
Immature Granulocytes: 0 %
Lymphocytes Relative: 26 %
Lymphs Abs: 1.5 10*3/uL (ref 0.7–4.0)
MCH: 29.7 pg (ref 26.0–34.0)
MCHC: 33.3 g/dL (ref 30.0–36.0)
MCV: 89.3 fL (ref 80.0–100.0)
Monocytes Absolute: 0.5 10*3/uL (ref 0.1–1.0)
Monocytes Relative: 8 %
Neutro Abs: 3.7 10*3/uL (ref 1.7–7.7)
Neutrophils Relative %: 64 %
Platelets: 145 10*3/uL — ABNORMAL LOW (ref 150–400)
RBC: 4.48 MIL/uL (ref 3.87–5.11)
RDW: 13.7 % (ref 11.5–15.5)
WBC: 5.8 10*3/uL (ref 4.0–10.5)
nRBC: 0 % (ref 0.0–0.2)

## 2020-01-29 LAB — CMP (CANCER CENTER ONLY)
ALT: 17 U/L (ref 0–44)
AST: 17 U/L (ref 15–41)
Albumin: 3.7 g/dL (ref 3.5–5.0)
Alkaline Phosphatase: 88 U/L (ref 38–126)
Anion gap: 9 (ref 5–15)
BUN: 11 mg/dL (ref 8–23)
CO2: 28 mmol/L (ref 22–32)
Calcium: 9 mg/dL (ref 8.9–10.3)
Chloride: 102 mmol/L (ref 98–111)
Creatinine: 0.76 mg/dL (ref 0.44–1.00)
GFR, Est AFR Am: >60 mL/min (ref >60)
GFR, Estimated: >60 mL/min (ref >60)
Glucose, Bld: 116 mg/dL — ABNORMAL HIGH (ref 70–99)
Potassium: 4.2 mmol/L (ref 3.5–5.1)
Sodium: 139 mmol/L (ref 135–145)
Total Bilirubin: 0.6 mg/dL (ref 0.3–1.2)
Total Protein: 6.7 g/dL (ref 6.5–8.1)

## 2020-01-29 LAB — LACTATE DEHYDROGENASE: LDH: 195 U/L — ABNORMAL HIGH (ref 98–192)

## 2020-01-29 NOTE — Telephone Encounter (Signed)
Opened in error

## 2020-01-29 NOTE — Progress Notes (Signed)
HEMATOLOGY/ONCOLOGY CLINIC NOTE  Date of Service: 01/29/20     Patient Care Team: Denita Lung, MD as PCP - General (Family Medicine) Dr. 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 Valtrex resolved the shingles but she continued to have progressive adenopathy, cough and an unintentional weight loss. On 10/15/2016, a CT scan showed bulky adenopathy in the supraclavicular, mediastinum, hilar and abdomen. On 10/21/16, an ultrasound guided core biopsy of the left supraclavicular lymph node showed a T cell lymphoproliferative process with the differential diagnosis most suggestive of a CD30 positive PTCL, NOS or ALK negative anaplastic lymphoma. A PET scan on 10/27/16, redemonstrated bulky hypermetabolic lymphadenopathy throughout the body and a hypermetabolic mass in the central lung with multiple nodules and bilateral pleural effusions. The highest SUV was in the left para-aortic region at 21.4.   In early April 2018, she started chemotherapy with CHOEP. Interim scans showed nearly completely resolved adenopathy, [place] 1. Completed 5 cycles with CR1 prior to transplant.  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 Hydration and BCNU 300 mg/m2 in 500 ml D5W over 2 hours today. 08/30 - Day -5.  Cytarabine 200 mg/m2 in 250 ml NS over 1 hour and etoposide 200 mg/m2 in 1 L NS over 3 hours. 08/31 - Day -4. Cytarabine 200 mg/m2 in 250 ml NS over 1 hour and etoposide 200 mg/m2 in 1 L NS over 3 hours. 09/01 - Day -3. Cytarabine 200 mg/m2 in 250 ml NS over 1 hour and etoposide 200 mg/m2 in 1 L NS over 3 hours. 09/02 - Day -2. Cytarabine 200 mg/m2 in 250 ml NS over 1 hour and etoposide 200 mg/m2 in 1 L NS over 3 hours. 09/03 - Day -1. Melphalan 140 mg/m2 in 100 ml NS over 30 minutes.   04/13/2017 Transplant  Outpatient Autologous stem cell transplant - Day 0 on 04/13/2017  ASBMT risk stratification: low risk ECOG/KPS: 1/Able to carry on normal activities; minor signs/symptoms of disease HSCT-CI score: CR1 Preparative regimen/treatment protocol: BEAM  Engraftment:  WBC on 04/23/2017 with WBC 1.8 and ANC 1.1 Platelets: Anticipated for 9/21 - last platelet transfusion on 11/09/270   12/11/6642 Complications  Transplant complications: Cx negative neutropenic fever - admitted to inpatient service and started on broad spectrum Zosyn which she continued until WBC engraftment on 04/23/2017  Mucositis: Grade II - managed with MMW    INTERVAL HISTORY  Denise [name] is here for her scheduled follow-up for her 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 The pt completed CHOEP followed by a preparative regimen for transplant April 2018-04/12/17. Autologous transplant was 04/13/17. The patient's last visit with [name] was on 07/28/2019. The pt reports that she is doing well overall.  The pt reports that she has been experiencing intermittent tingling in her left foot for the last two months. Pt denies any pain  or injury to her back. She has received both doses of the COVID19 vaccine. She has felt well in the interim and denies any constitutional symptoms.   Lab results today (01/29/20) of CBC w/diff and CMP is as follows: all values are WNL except for PLT at 145K, Glucose at 116. 01/29/2020 LDH at 195  On  review of systems, pt reports bruising, left foot tingling and denies abnormal/excessive bleeding, fevers, chills, night sweats, unexpected weight loss, constipation and any other symptoms.    MEDICAL HISTORY:  Past Medical History:  Diagnosis Date  . Allergy   . Asthma   . Bursitis of left hip   . Fibroids    TAH in 1999  . GERD (gastroesophageal reflux disease)   . Lymphoma ([place])    T cell lymphoma  . PVC's (premature ventricular contractions)   . Sleep apnea    does not use CPAP    SURGICAL HISTORY: Past Surgical History:  Procedure Laterality Date  . CARDIAC CATH  05/2007   J. BERRY  . HAMMER TOE SURGERY Bilateral 2016  . IR GENERIC HISTORICAL  11/05/2016   IR US GUIDE VASC ACCESS RIGHT 11/05/2016 [name], MD [facility]INTER[facility]  . IR GENERIC HISTORICAL  11/05/2016   IR FLUORO GUIDE PORT INSERTION RIGHT 11/05/2016 [name], MD [facility]  . TOTAL ABDOMINAL HYSTERECTOMY  05/15/1998   secondary to fibroids  . WISDOM TOOTH EXTRACTION  mid teens    SOCIAL HISTORY: Social History   Socioeconomic History  . Marital status: Significant Other    Spouse name: Not on file  . Number of children: 0  . Years of education: Not on file  . Highest education level: Not on file  Occupational History  . 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[employer]: [employer]  Tobacco Use  . Smoking status: Never Smoker  . Smokeless tobacco: Never Used  Vaping Use  . Vaping Use: Never used  Substance and Sexual Activity  . Alcohol use: No  . Drug use: No  . Sexual activity: Yes    Partners: Female    Birth control/protection: None  Other Topics Concern  . Not on file  Social History Narrative  . Not on file   Social Determinants of Health   Financial Resource Strain:   . Difficulty of Paying Living Expenses:   Food Insecurity:   . Worried About Charity fundraiser in the Last Year:   . Arboriculturist in the Last Year:   Transportation Needs:   .  Film/video editor (Medical):   Marland Kitchen Lack of Transportation (Non-Medical):   Physical Activity:   . Days of Exercise per Week:   . Minutes of Exercise per Session:   Stress:   . Feeling of Stress :   Social Connections:   . Frequency of Communication with Friends and Family:   . Frequency of Social Gatherings with Friends and Family:   . Attends Religious Services:   . Active Member of Clubs or Organizations:   . Attends Archivist Meetings:   Marland Kitchen Marital Status:   Intimate Partner Violence:   . Fear of Current or Ex-Partner:   . Emotionally Abused:   Marland Kitchen Physically Abused:   . Sexually Abused:     FAMILY HISTORY: Family History  Problem Relation Age of Onset  . Cancer Maternal Grandmother        GI cancer in 31's  . Dementia Mother   . Heart  disease Father   . Heart failure Father     ALLERGIES:  is allergic to morphine and related.  MEDICATIONS:  Current Outpatient Medications  Medication Sig Dispense Refill  . ondansetron (ZOFRAN) 4 MG tablet TAKE 1 TABLET(4 MG) BY MOUTH EVERY 8 HOURS AS NEEDED FOR NAUSEA OR VOMITING 12 tablet 0  . acyclovir ointment (ZOVIRAX) 5 % Apply 1 application topically every 4 (four) hours. To lesions over chest wall (Patient not taking: Reported on 07/10/2019) 15 g 0  . ADVAIR DISKUS 100-50 MCG/DOSE AEPB INHALE 1 PUFF INTO THE LUNGS DAILY AS NEEDED FOR SHORTNESS OF BREATH OR WHEEZING (Patient not taking: Reported on 09/29/2018) 3 each 3  . albuterol (VENTOLIN HFA) 108 (90 Base) MCG/ACT inhaler INHALE 2 PUFFS INTO THE LUNGS EVERY 6 HOURS AS NEEDED FOR WHEEZING OR SHORTNESS OF BREATH 18 g 0  . ALPRAZolam (XANAX) 0.25 MG tablet Take 1 tablet (0.25 mg total) by mouth 2 (two) times daily as needed. 50 tablet 0  . aspirin 81 MG chewable tablet Chew 81 mg by mouth daily as needed for moderate pain.     Marland Kitchen atenolol (TENORMIN) 50 MG tablet TAKE 1 TABLET BY MOUTH TWICE DAILY 180 tablet 1  . esomeprazole (NEXIUM) 40 MG capsule TAKE 1 CAPSULE(40 MG) BY  MOUTH DAILY BEFORE BREAKFAST 90 capsule 0  . fluticasone (FLONASE) 50 MCG/ACT nasal spray Place into the nose.    . hydrochlorothiazide (MICROZIDE) 12.5 MG capsule TAKE 1 CAPSULE BY MOUTH EVERY DAY 90 capsule 0  . meloxicam (MOBIC) 15 MG tablet TAKE 1 TABLET BY MOUTH EVERY DAY AS NEEDED FOR MUSCLE SPASMS 90 tablet 1  . triazolam (HALCION) 0.25 MG tablet TAKE 1 TABLET(0.25 MG) BY MOUTH AT BEDTIME AS NEEDED FOR SLEEP 90 tablet 1  . WIXELA INHUB 100-50 MCG/DOSE AEPB INHALE 1 PUFF INTO THE LUNGS TWICE DAILY 60 each 5   No current facility-administered medications for this visit.    REVIEW OF SYSTEMS:   A 10+ POINT REVIEW OF SYSTEMS WAS OBTAINED including neurology, dermatology, psychiatry, cardiac, respiratory, lymph, extremities, GI, GU, Musculoskeletal, constitutional, breasts, reproductive, HEENT.  All pertinent positives are noted in the HPI.  All others are negative.     PHYSICAL EXAMINATION:  ECOG FS:0 - Asymptomatic  Vitals:   01/29/20 0909  BP: (!) 155/53  Pulse: (!) 55  Resp: 18  Temp: 97.7 F (36.5 C)  SpO2: 100%   Wt Readings from Last 3 Encounters:  01/29/20 190 lb (86.2 kg)  11/06/19 190 lb (86.2 kg)  10/12/19 194 lb (88 kg)   Body mass index is 32.61 kg/m.    GENERAL:alert, in no acute distress and comfortable SKIN: no acute rashes, no significant lesions EYES: conjunctiva are pink and non-injected, sclera anicteric OROPHARYNX: MMM, no exudates, no oropharyngeal erythema or ulceration NECK: supple, no JVD LYMPH:  no palpable lymphadenopathy in the cervical, axillary or inguinal regions LUNGS: clear to auscultation b/l with normal respiratory effort HEART: regular rate & rhythm ABDOMEN:  normoactive bowel sounds , non tender, not distended. No palpable hepatosplenomegaly.  Extremity: no pedal edema PSYCH: alert & oriented x 3 with fluent speech NEURO: no focal motor/sensory deficits  LABORATORY DATA:  I have reviewed the data as listed  . CBC Latest Ref Rng  & Units 01/29/2020 07/28/2019 01/27/2019  WBC 4.0 - 10.5 K/uL 5.8 6.7 6.2  Hemoglobin 12.0 - 15.0 g/dL 13.3 13.4 13.2  Hematocrit 36 - 46 % 40.0 40.6 40.3  Platelets 150 - 400 K/uL 145(L) 160  147(L)   . CMP Latest Ref Rng & Units 01/29/2020 07/28/2019 01/27/2019  Glucose 70 - 99 mg/dL 116(H) 108(H) 110(H)  BUN 8 - 23 mg/dL '11 12 16  ' Creatinine 0.44 - 1.00 mg/dL 0.76 0.80 0.82  Sodium 135 - 145 mmol/L 139 138 137  Potassium 3.5 - 5.1 mmol/L 4.2 4.4 4.3  Chloride 98 - 111 mmol/L 102 101 100  CO2 22 - 32 mmol/L '28 29 27  ' Calcium 8.9 - 10.3 mg/dL 9.0 8.9 8.7(L)  Total Protein 6.5 - 8.1 g/dL 6.7 6.9 7.1  Total Bilirubin 0.3 - 1.2 mg/dL 0.6 0.5 0.5  Alkaline Phos 38 - 126 U/L 88 104 114  AST 15 - 41 U/L '17 22 19  ' ALT 0 - 44 U/L '17 21 21         ' RADIOGRAPHIC STUDIES: I have personally reviewed the radiological images as listed and agreed with the findings in the report. No results found.   PET Lymphoma [place] (with/ low dose CT) , 07/22/17 IMPRESSION: 1. Mildly hypermetabolic bilateral pulmonary airspace opacities that are favored to represent infection versus complications of stem cell transplant. Follow-up with diagnostic CT may be helpful to distinguish. 2. No FDG avid malignant disease at present. Ancillary CT findings as above.  04/14/18 CT C/A/P and NECK:        10/13/18 CT C/A/P and Neck:     04/13/2019   04/13/2019   ASSESSMENT & PLAN:   64 y.o. 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   PET/CT scan shows bulky hypermetabolic lymphadenopathy throughout the neck, chest, and abdomen. 1 cm hypermetabolic pelvic lymph node in right external iliac chain. Hypermetabolic masslike opacity in central right lung with multiple adjacent hypermetabolic nodules. This is suspicious for pulmonary involvement by lymphoma  Fatigue anorexia or night sweats-drenching, and weight  loss suggest constitutional type B symptoms.  Hepatitis profile and HIV neg  Patient is s/p 5 cycles of CHOEP tolerated well overall and received CHOP for 6th cycle due to significant cytopenias after 5th cycle of CHOEP. PET/CT on 01/08/2017 - showed near completely resolved LNadenopathy.  She received her autologous stem cell transplant with Dr Duanne Moron at [facility] on 04/13/2017 which she has tolerated well and has recovered well with resolved cytopenias.  -PET scan on 07/22/2017 with results of: No FDG avid malignant disease at present.  04/14/18 CT C/A/P and Neck revealed No evidence of disease return of progression    #2 history of PVCs -On chronic beta blocker therapy.  #3 history of sleep apnea -continue CPAP use  #4 severe anxiety - this has improved   PLAN: -Discussed pt labwork today, 01/29/20; blood counts and chemistries are nml, LDH is borderline elevated, but within her nml range -No lab or clinical evidence of Anaplastic Large T-cell Lymphoma recurrence at this time.   -Pt is nearly 3 years out from transplant. No indication for treatment at this time.  -Advised pt that Aspirin and Meloxicam can increase bruising. -Advised pt that tingling in LLE could be due to a pinched nerve. Less likely to be caused by neuropathy as it is more noticeable after sitting for extended periods of time.  -Recommend pt stay active and engage core when sitting for longer periods of time.  -Will see back in 12 months with labs -interim f/u as planned with [facility] BMT team  FOLLOW UP: RTC with Dr Irene Limbo with labs in 12  months.   The total time spent in the appt was 20 minutes and more than 50% was on counseling and direct patient cares.  All of the patient's questions were answered with apparent satisfaction. The patient knows to call the clinic with any problems, questions or concerns.    Sullivan Lone MD [place] AAHIVMS [facility] Hematology/Oncology Physician [facility]  (Office):        [phone] (Work cell):  [phone] (Fax):           [phone]  I, Yevette Edwards, am acting as a scribe for Dr. Sullivan Lone.   .I have reviewed the above documentation for accuracy and completeness, and I agree with the above. Brunetta Genera MD

## 2020-02-02 ENCOUNTER — Telehealth: Payer: Self-pay | Admitting: Hematology

## 2020-02-02 NOTE — Telephone Encounter (Signed)
Scheduled per 06/21 los, patient has been called and voicemail was left.

## 2020-02-25 ENCOUNTER — Other Ambulatory Visit: Payer: Self-pay | Admitting: Family Medicine

## 2020-02-26 ENCOUNTER — Other Ambulatory Visit: Payer: Self-pay | Admitting: Family Medicine

## 2020-02-26 NOTE — Telephone Encounter (Signed)
[facility] is requesting to fill pt meloxicam. Please advise [facility] 

## 2020-03-04 ENCOUNTER — Other Ambulatory Visit: Payer: Self-pay | Admitting: Family Medicine

## 2020-03-04 DIAGNOSIS — J4530 Mild persistent asthma, uncomplicated: Secondary | ICD-10-CM

## 2020-03-04 DIAGNOSIS — Z638 Other specified problems related to primary support group: Secondary | ICD-10-CM

## 2020-03-04 NOTE — Telephone Encounter (Signed)
Are these okay to refill? 

## 2020-03-22 LAB — HM MAMMOGRAPHY

## 2020-03-26 ENCOUNTER — Other Ambulatory Visit: Payer: Self-pay

## 2020-03-26 ENCOUNTER — Encounter: Payer: Self-pay | Admitting: Family Medicine

## 2020-03-26 ENCOUNTER — Ambulatory Visit (INDEPENDENT_AMBULATORY_CARE_PROVIDER_SITE_OTHER): Payer: BC Managed Care – PPO | Admitting: Family Medicine

## 2020-03-26 VITALS — BP 154/84 | HR 66 | Temp 98.9°F | Wt 189.2 lb

## 2020-03-26 DIAGNOSIS — Z7189 Other specified counseling: Secondary | ICD-10-CM | POA: Diagnosis not present

## 2020-03-26 DIAGNOSIS — R030 Elevated blood-pressure reading, without diagnosis of hypertension: Secondary | ICD-10-CM

## 2020-03-26 NOTE — Progress Notes (Signed)
   Subjective:    Patient ID: Denise [name], female    DOB: [birth year], 64 y.o.   MRN: [id number]  HPI She is here for consult concerning the fact that her mother died about 2 weeks ago when she is having sleep difficulty as well as occasional dizziness, abdominal distress and occasional nausea.  She is concerned about her blood pressure.   Review of Systems     Objective:   Physical Exam Alert and in no distress.  Her blood pressure is recorded.       Assessment & Plan:  Grief counseling  Elevated blood pressure reading I explained that this is probably all grief from her mother dying as she was her support system.  She has been involved in an Alzheimer's support group and I strongly encouraged her to get involved with hospice for grief counseling.  We will recheck her blood pressure with her next visit which will be in January.

## 2020-03-27 ENCOUNTER — Other Ambulatory Visit: Payer: Self-pay | Admitting: Family Medicine

## 2020-03-27 ENCOUNTER — Encounter: Payer: Self-pay | Admitting: Family Medicine

## 2020-03-27 DIAGNOSIS — J4530 Mild persistent asthma, uncomplicated: Secondary | ICD-10-CM

## 2020-03-27 NOTE — Telephone Encounter (Signed)
[facility] is requesting to fill pt albuterol. Inhaler Please advise [facility]

## 2020-04-22 ENCOUNTER — Other Ambulatory Visit: Payer: Self-pay | Admitting: Family Medicine

## 2020-04-22 NOTE — Telephone Encounter (Signed)
[facility] is requesting to fill pt ondansetron and triazolam. Please advise [facility]

## 2020-04-23 ENCOUNTER — Other Ambulatory Visit: Payer: Self-pay | Admitting: Family Medicine

## 2020-04-23 DIAGNOSIS — J4530 Mild persistent asthma, uncomplicated: Secondary | ICD-10-CM

## 2020-04-23 NOTE — Telephone Encounter (Signed)
[facility] is requesting to fill pt albuterol inhaler. Please advise [facility] 

## 2020-04-30 ENCOUNTER — Other Ambulatory Visit: Payer: Self-pay | Admitting: Family Medicine

## 2020-05-06 ENCOUNTER — Other Ambulatory Visit: Payer: Self-pay | Admitting: Family Medicine

## 2020-05-06 NOTE — Telephone Encounter (Signed)
[facility] is requesting to fill pt wixela. Please advise [facility]

## 2020-05-21 ENCOUNTER — Other Ambulatory Visit: Payer: Self-pay | Admitting: Family Medicine

## 2020-05-21 DIAGNOSIS — J4530 Mild persistent asthma, uncomplicated: Secondary | ICD-10-CM

## 2020-05-21 NOTE — Telephone Encounter (Signed)
[facility] is requesting to fill pt albuterol. Please advise [facility]

## 2020-05-24 ENCOUNTER — Telehealth: Payer: Self-pay | Admitting: Family Medicine

## 2020-05-24 DIAGNOSIS — Z638 Other specified problems related to primary support group: Secondary | ICD-10-CM

## 2020-05-24 NOTE — Telephone Encounter (Signed)
[facility] is requesting to fill pt xanax . Please advise [facility] 

## 2020-05-27 MED ORDER — ALPRAZOLAM 0.25 MG PO TABS: ORAL_TABLET | ORAL | 0 refills | Status: DC

## 2020-05-27 NOTE — Addendum Note (Signed)
Addended by: Denita Lung on: 05/27/2020 08:22 AM   Modules accepted: Orders

## 2020-05-27 NOTE — Telephone Encounter (Signed)
Please advise due to fail e scribe. [place]

## 2020-07-13 ENCOUNTER — Other Ambulatory Visit: Payer: Self-pay | Admitting: Family Medicine

## 2020-08-09 ENCOUNTER — Other Ambulatory Visit: Payer: Self-pay | Admitting: Family Medicine

## 2020-08-12 NOTE — Telephone Encounter (Signed)
Are these okay to refill? 

## 2020-08-14 ENCOUNTER — Other Ambulatory Visit: Payer: Self-pay | Admitting: Family Medicine

## 2020-08-14 DIAGNOSIS — Z638 Other specified problems related to primary support group: Secondary | ICD-10-CM

## 2020-08-14 NOTE — Telephone Encounter (Signed)
Is this okay to refill? 

## 2020-09-13 ENCOUNTER — Other Ambulatory Visit: Payer: Self-pay | Admitting: Family Medicine

## 2020-09-13 NOTE — Telephone Encounter (Signed)
[facility] is requesting to fill pt wixela. Please advise [facility]

## 2020-09-27 ENCOUNTER — Other Ambulatory Visit: Payer: Self-pay | Admitting: Family Medicine

## 2020-10-14 ENCOUNTER — Other Ambulatory Visit: Payer: Self-pay | Admitting: Family Medicine

## 2020-10-18 ENCOUNTER — Telehealth: Payer: Self-pay | Admitting: Hematology

## 2020-10-18 NOTE — Telephone Encounter (Signed)
Rescheduled June appointment per patient's request, patient is notified of new upcoming appointment.

## 2020-11-14 ENCOUNTER — Other Ambulatory Visit: Payer: Self-pay | Admitting: Family Medicine

## 2020-11-14 DIAGNOSIS — Z638 Other specified problems related to primary support group: Secondary | ICD-10-CM

## 2020-11-14 NOTE — Telephone Encounter (Signed)
[facility] is requesting to fill pt xanax . Please advise [facility] 

## 2020-12-04 ENCOUNTER — Telehealth: Payer: Self-pay | Admitting: Hematology

## 2020-12-04 NOTE — Telephone Encounter (Signed)
Sch per 6/21 los, patient didn't answer left message.

## 2020-12-27 ENCOUNTER — Ambulatory Visit (INDEPENDENT_AMBULATORY_CARE_PROVIDER_SITE_OTHER): Payer: Medicare Other | Admitting: Family Medicine

## 2020-12-27 ENCOUNTER — Encounter: Payer: Self-pay | Admitting: Family Medicine

## 2020-12-27 ENCOUNTER — Other Ambulatory Visit: Payer: Self-pay

## 2020-12-27 VITALS — BP 130/82 | HR 51 | Temp 97.6°F | Ht 63.0 in | Wt 189.6 lb

## 2020-12-27 DIAGNOSIS — E7439 Other disorders of intestinal carbohydrate absorption: Secondary | ICD-10-CM

## 2020-12-27 DIAGNOSIS — G4730 Sleep apnea, unspecified: Secondary | ICD-10-CM

## 2020-12-27 DIAGNOSIS — R739 Hyperglycemia, unspecified: Secondary | ICD-10-CM | POA: Diagnosis not present

## 2020-12-27 DIAGNOSIS — Z23 Encounter for immunization: Secondary | ICD-10-CM

## 2020-12-27 DIAGNOSIS — K219 Gastro-esophageal reflux disease without esophagitis: Secondary | ICD-10-CM

## 2020-12-27 DIAGNOSIS — Z9484 Stem cells transplant status: Secondary | ICD-10-CM

## 2020-12-27 DIAGNOSIS — J4530 Mild persistent asthma, uncomplicated: Secondary | ICD-10-CM

## 2020-12-27 DIAGNOSIS — F419 Anxiety disorder, unspecified: Secondary | ICD-10-CM

## 2020-12-27 DIAGNOSIS — E669 Obesity, unspecified: Secondary | ICD-10-CM

## 2020-12-27 DIAGNOSIS — J301 Allergic rhinitis due to pollen: Secondary | ICD-10-CM

## 2020-12-27 DIAGNOSIS — Z8249 Family history of ischemic heart disease and other diseases of the circulatory system: Secondary | ICD-10-CM | POA: Diagnosis not present

## 2020-12-27 DIAGNOSIS — I10 Essential (primary) hypertension: Secondary | ICD-10-CM

## 2020-12-27 DIAGNOSIS — C8478 Anaplastic large cell lymphoma, ALK-negative, lymph nodes of multiple sites: Secondary | ICD-10-CM

## 2020-12-27 DIAGNOSIS — M1990 Unspecified osteoarthritis, unspecified site: Secondary | ICD-10-CM

## 2020-12-27 LAB — POCT GLYCOSYLATED HEMOGLOBIN (HGB A1C): Hemoglobin A1C: 6 % — AB (ref 4.0–5.6)

## 2020-12-27 MED ORDER — HYDROCHLOROTHIAZIDE 12.5 MG PO CAPS: ORAL_CAPSULE | ORAL | 3 refills | Status: DC

## 2020-12-27 MED ORDER — ATENOLOL 50 MG PO TABS: 50.0000 mg | ORAL_TABLET | Freq: Two times a day (BID) | ORAL | 3 refills | Status: DC

## 2020-12-27 MED ORDER — FLUTICASONE-SALMETEROL 100-50 MCG/ACT IN AEPB: 1.0000 | INHALATION_SPRAY | Freq: Two times a day (BID) | RESPIRATORY_TRACT | 3 refills | Status: DC

## 2020-12-27 NOTE — Progress Notes (Signed)
Denise Denise [name] is a 65 y.o. female who presents for a welcome to Medicare visit and follow-up on chronic medical conditions.  She gets regular follow-up with oncology as well as her transplant team.  And mammogram and Denise [name] get a Pap in the future.  She has been under a lot of stress dealing with the death of her mother and settling the estate.  That seems to be behind her but she is still having some anxiety dealing with that.  She has been hospice which is helped.  She does occasionally use Xanax.  Her asthma seems to be under good control.  She does have reflux but is rarely using her Nexium.  She does have a history of sleep apnea but is not using the CPAP.  She did not tolerate it.  She does have evidence of glucose intolerance.  She does ride her bike 20 minutes every day.  Her eating habits are unchanged.  Immunizations and Health Maintenance Immunization History  Administered Date(s) Administered  . DTaP / IPV 10/21/2017, 01/13/2018, 04/14/2018  . Hepatitis B 10/21/2017, 01/13/2018, 10/13/2018  . HiB (PRP-OMP) 10/21/2017, 01/13/2018, 04/14/2018  . Influenza Split 04/19/2019  . Influenza Whole 08/05/1999, 04/23/2000, 04/02/2011  . Influenza, Seasonal, Injecte, Preservative Fre 08/13/2012  . Influenza,inj,Quad PF,6+ Mos 04/19/2019  . Influenza-Unspecified 08/13/2012, 06/29/2014, 10/21/2017  . MMR 04/13/2019  . PFIZER(Purple Top)SARS-COV-2 Vaccination 10/06/2019, 10/27/2019, 05/25/2020  . Pneumococcal Conjugate-13 10/21/2017, 01/13/2018, 04/14/2018  . Pneumococcal Polysaccharide-23 10/13/2018  . Td 04/14/1991, 02/03/1999  . Tdap 04/02/2011  . Zoster Recombinat (Shingrix) 01/31/2019, 04/19/2019   Health Maintenance Due  Topic Date Due  . FOOT EXAM  Never done  . OPHTHALMOLOGY EXAM  Never done  . URINE MICROALBUMIN  Never done  . HEMOGLOBIN A1C  01/07/2020  . COVID-19 Vaccine (4 - Booster for Pfizer series) 08/25/2020    Last Pap smear: hysterectomy  Last mammogram: 03/22/20 Last  colonoscopy: 03/03/13 Last DEXA: 02/02/11 Dentist: Q year Ophtho: Q two years Exercise: ride bike 20 min a day  Other doctors caring for patient include: Dr. Irene Limbo oncology, Dr. Marlow Ba[facility]  Advanced directives: Does Patient Have a Medical Advance Directive?: Yes Type of Advance Directive: Living Denise [name] Does patient want to make changes to medical advance directive?: No - Patient declined  Depression screen:  See questionnaire below.  Depression screen [facility] 2/9 12/27/2020 05/10/2018  Decreased Interest 0 0  Down, Depressed, Hopeless 1 0  PHQ - 2 Score 1 0  Some recent data might be hidden    Fall Risk Screen: see questionnaire below. Fall Risk  12/27/2020 05/10/2018  Falls in the past year? 0 No  Number falls in past yr: 0 -  Injury with Fall? 0 -  Risk for fall due to : No Fall Risks -    ADL screen:  See questionnaire below Functional Status Survey: Is the patient deaf or have difficulty hearing?: No Does the patient have difficulty seeing, even when wearing glasses/contacts?: No Does the patient have difficulty concentrating, remembering, or making decisions?: No Does the patient have difficulty walking or climbing stairs?: No Does the patient have difficulty dressing or bathing?: No Does the patient have difficulty doing errands alone such as visiting a doctor's office or shopping?: No   Review of Systems Constitutional: -, -unexpected weight change, -anorexia, -fatigue Allergy: -sneezing, -itching, -congestion Dermatology: denies changing moles, rash, lumps ENT: -runny nose, -ear pain, -sore throat,  Cardiology:  -chest pain, -palpitations, -orthopnea, Respiratory: -cough, -shortness of breath, -dyspnea on  exertion, -wheezing,  Gastroenterology: -abdominal pain, -nausea, -vomiting, -diarrhea, -constipation, -dysphagia Hematology: -bleeding or bruising problems Musculoskeletal: -arthralgias, -myalgias, -joint swelling, -back pain, - Ophthalmology: -vision changes,   Urology: -dysuria, -difficulty urinating,  -urinary frequency, -urgency, incontinence Neurology: -, -numbness, , -memory loss, -falls, -dizziness    PHYSICAL EXAM:  LMP 04/25/1998 (Exact Date)   General Appearance: Alert, cooperative, no distress, appears stated age Head: Normocephalic, without obvious abnormality, atraumatic Eyes: PERRL, conjunctiva/corneas clear, EOM's intact,  Ears: Normal TM's and external ear canals Nose: Nares normal, mucosa normal, no drainage or sinus tenderness Throat: Lips, mucosa, and tongue normal; teeth and gums normal Neck: Supple, no lymphadenopathy;  thyroid:  no enlargement/tenderness/nodules; no carotid bruit or JVD Lungs: Clear to auscultation bilaterally without wheezes, rales or ronchi; respirations unlabored Heart: Regular rate and rhythm, S1 and S2 normal, no murmur, rubor gallop Abdomen: Soft, non-tender, nondistended, normoactive bowel sounds,  no masses, no hepatosplenomegaly Skin:  Skin color, texture, turgor normal, no rashes or lesions Lymph nodes: Cervical, supraclavicular, and axillary nodes normal Neurologic:  CNII-XII intact, normal strength, sensation and gait; reflexes 2+ and symmetric throughout Psych: Normal mood, affect, hygiene and grooming. 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 ([place]) - Plan: CBC with Differential/Platelet, Comprehensive metabolic panel  H/O autologous stem cell transplant ([place])  Hyperglycemia - Plan: POCT glycosylated hemoglobin (Hb A1C)  Obesity (BMI 30-39.9)  Arthritis - Plan: Ambulatory referral to Podiatry  Gastroesophageal reflux disease without esophagitis  Sleep apnea, unspecified type  Allergic rhinitis due to pollen, unspecified  seasonality  Mild persistent extrinsic asthma without complication - Plan: fluticasone-salmeterol (WIXELA INHUB) 100-50 MCG/ACT AEPB  Essential hypertension - Plan: atenolol (TENORMIN) 50 MG tablet, hydrochlorothiazide (MICROZIDE) 12.5 MG capsule  Anxiety  Glucose intolerance Discussed use of Xanax to help with her anxiety to cry give her some pointers to try and deal with anxiety before using Xanax although she is not using very much. Discussed glucose intolerance and risk of diabetes.  Encouraged her to continue with exercise and make further dietary changes to specifically cut back on carbohydrates.  Continue on her asthma and  medication.  Did not push the issue of CPAP with her as she is not interested in pursuing that.    Discussed mammograms; at least 30 minutes of aerobic activity at least 5 days/week and weight-bearing exercise 2x/week;reviewed; healthy diet, including goals of calcium and vitamin D intake   Immunization recommendations discussed.  Recommend she get the Tdap at the drugstore.  Colonoscopy recommendations reviewed   Medicare Attestation I have personally reviewed: The patient's medical and social history Their use of alcohol, tobacco or illicit drugs Their current medications and supplements The patient's functional ability including ADLs,fall risks, home safety risks, cognitive, and hearing and visual impairment Diet and physical activities Evidence for depression or mood disorders  The patient's weight, height, and BMI have been recorded in the chart.  I have made referrals, counseling, and provided education to the patient based on review of the above and I have provided the patient with a written personalized care plan for preventive services.     Jill Alexanders, MD   12/27/2020

## 2020-12-28 LAB — LIPID PANEL
Chol/HDL Ratio: 2.7 ratio (ref 0.0–4.4)
Cholesterol, Total: 213 mg/dL — ABNORMAL HIGH (ref 100–199)
HDL: 80 mg/dL (ref >39)
LDL Chol Calc (NIH): 119 mg/dL — ABNORMAL HIGH (ref 0–99)
Triglycerides: 81 mg/dL (ref 0–149)
VLDL Cholesterol Cal: 14 mg/dL (ref 5–40)

## 2020-12-28 LAB — CBC WITH DIFFERENTIAL/PLATELET
Basophils Absolute: 0.1 10*3/uL (ref 0.0–0.2)
Basos: 1 %
EOS (ABSOLUTE): 0.1 10*3/uL (ref 0.0–0.4)
Eos: 2 %
Hematocrit: 42.3 % (ref 34.0–46.6)
Hemoglobin: 14.2 g/dL (ref 11.1–15.9)
Immature Grans (Abs): 0 10*3/uL (ref 0.0–0.1)
Immature Granulocytes: 0 %
Lymphocytes Absolute: 1.8 10*3/uL (ref 0.7–3.1)
Lymphs: 25 %
MCH: 29.3 pg (ref 26.6–33.0)
MCHC: 33.6 g/dL (ref 31.5–35.7)
MCV: 87 fL (ref 79–97)
Monocytes Absolute: 0.6 10*3/uL (ref 0.1–0.9)
Monocytes: 8 %
Neutrophils Absolute: 4.5 10*3/uL (ref 1.4–7.0)
Neutrophils: 64 %
Platelets: 162 10*3/uL (ref 150–450)
RBC: 4.85 x10E6/uL (ref 3.77–5.28)
RDW: 13 % (ref 11.7–15.4)
WBC: 7.1 10*3/uL (ref 3.4–10.8)

## 2020-12-28 LAB — COMPREHENSIVE METABOLIC PANEL
ALT: 16 IU/L (ref 0–32)
AST: 21 IU/L (ref 0–40)
Albumin/Globulin Ratio: 2 (ref 1.2–2.2)
Albumin: 4.6 g/dL (ref 3.8–4.8)
Alkaline Phosphatase: 85 IU/L (ref 44–121)
BUN/Creatinine Ratio: 18 (ref 12–28)
BUN: 12 mg/dL (ref 8–27)
Bilirubin Total: 0.7 mg/dL (ref 0.0–1.2)
CO2: 24 mmol/L (ref 20–29)
Calcium: 9.3 mg/dL (ref 8.7–10.3)
Chloride: 101 mmol/L (ref 96–106)
Creatinine, Ser: 0.65 mg/dL (ref 0.57–1.00)
Globulin, Total: 2.3 g/dL (ref 1.5–4.5)
Glucose: 101 mg/dL — ABNORMAL HIGH (ref 65–99)
Potassium: 4.1 mmol/L (ref 3.5–5.2)
Sodium: 139 mmol/L (ref 134–144)
Total Protein: 6.9 g/dL (ref 6.0–8.5)
eGFR: 98 mL/min/{1.73_m2} (ref >59)

## 2021-01-08 ENCOUNTER — Ambulatory Visit (INDEPENDENT_AMBULATORY_CARE_PROVIDER_SITE_OTHER): Payer: Medicare Other | Admitting: Podiatry

## 2021-01-08 ENCOUNTER — Ambulatory Visit (INDEPENDENT_AMBULATORY_CARE_PROVIDER_SITE_OTHER): Payer: Medicare Other

## 2021-01-08 ENCOUNTER — Other Ambulatory Visit: Payer: Self-pay | Admitting: Podiatry

## 2021-01-08 ENCOUNTER — Other Ambulatory Visit: Payer: Self-pay

## 2021-01-08 DIAGNOSIS — M069 Rheumatoid arthritis, unspecified: Secondary | ICD-10-CM

## 2021-01-08 DIAGNOSIS — G5763 Lesion of plantar nerve, bilateral lower limbs: Secondary | ICD-10-CM

## 2021-01-08 DIAGNOSIS — M1990 Unspecified osteoarthritis, unspecified site: Secondary | ICD-10-CM | POA: Diagnosis not present

## 2021-01-08 DIAGNOSIS — L909 Atrophic disorder of skin, unspecified: Secondary | ICD-10-CM

## 2021-01-09 ENCOUNTER — Encounter: Payer: Self-pay | Admitting: Podiatry

## 2021-01-09 NOTE — Progress Notes (Signed)
Subjective:  Patient ID: Denise [name], female    DOB: 10/21/1955,  MRN: [id number]  Chief Complaint  Patient presents with  . Arthritis    Bilateral foot pain     65 y.o. female presents with the above complaint.  Patient presents with complaint of bilateral foot forefoot pain/metatarsalgia pain.  Patient states that it continues to get worse as she is standing on her foot.  She states that she does elevate her feet every day.  It does get swollen in the forefoot.  She has not seen anyone else prior to seeing me.  She would like to discuss treatment options for this.  She denies any other acute complaints.  She does not wear any orthotics.  She wears good shoes.   Review of Systems: Negative except as noted in the HPI. Denies N/V/F/Ch.  Past Medical History:  Diagnosis Date  . Allergy   . Asthma   . Bursitis of left hip   . Fibroids    TAH in 1999  . GERD (gastroesophageal reflux disease)   . Lymphoma ([place])    T cell lymphoma  . PVC's (premature ventricular contractions)   . Sleep apnea    does not use CPAP    Current Outpatient Medications:  .  albuterol (VENTOLIN HFA) 108 (90 Base) MCG/ACT inhaler, INHALE 2 PUFFS INTO THE LUNGS EVERY 6 HOURS AS NEEDED FOR WHEEZING OR SHORTNESS OF BREATH, Disp: 18 g, Rfl: 0 .  ALPRAZolam (XANAX) 0.25 MG tablet, TAKE 1 TABLET(0.25 MG) BY MOUTH TWICE DAILY AS NEEDED, Disp: 50 tablet, Rfl: 1 .  aspirin 81 MG chewable tablet, Chew 81 mg by mouth daily as needed for moderate pain. , Disp: , Rfl:  .  atenolol (TENORMIN) 50 MG tablet, Take 1 tablet (50 mg total) by mouth 2 (two) times daily., Disp: 180 tablet, Rfl: 3 .  esomeprazole (NEXIUM) 40 MG capsule, TAKE 1 CAPSULE(40 MG) BY MOUTH DAILY BEFORE BREAKFAST (Patient not taking: No sig reported), Disp: 90 capsule, Rfl: 0 .  fluticasone (FLONASE) 50 MCG/ACT nasal spray, Place into the nose., Disp: , Rfl:  .  fluticasone-salmeterol (WIXELA INHUB) 100-50 MCG/ACT AEPB, Inhale 1 puff into the lungs 2  (two) times daily., Disp: 3 each, Rfl: 3 .  hydrochlorothiazide (MICROZIDE) 12.5 MG capsule, TAKE 1 CAPSULE BY MOUTH EVERY DAY, Disp: 90 capsule, Rfl: 3 .  meloxicam (MOBIC) 15 MG tablet, TAKE 1 TABLET BY MOUTH EVERY DAY AS NEEDED FOR MUSCLE SPASMS (Patient not taking: Reported on 12/27/2020), Disp: 90 tablet, Rfl: 1 .  ondansetron (ZOFRAN) 4 MG tablet, TAKE 1 TABLET(4 MG) BY MOUTH EVERY 8 HOURS AS NEEDED FOR NAUSEA OR VOMITING (Patient not taking: Reported on 12/27/2020), Disp: 12 tablet, Rfl: 0 .  Probiotic Product (PROBIOTIC DAILY PO), Take 1 capsule by mouth daily., Disp: , Rfl:  .  triazolam (HALCION) 0.25 MG tablet, TAKE 1 TABLET(0.25 MG) BY MOUTH AT BEDTIME AS NEEDED FOR SLEEP, Disp: 90 tablet, Rfl: 0  Social History   Tobacco Use  Smoking Status Never Smoker  Smokeless Tobacco Never Used    Allergies  Allergen Reactions  . Morphine And Related    Objective:  There were no vitals filed for this visit. There is no height or weight on file to calculate BMI. Constitutional Well developed. Well nourished.  Vascular Dorsalis pedis pulses palpable bilaterally. Posterior tibial pulses palpable bilaterally. Capillary refill normal to all digits.  No cyanosis or clubbing noted. Pedal hair growth normal.  Neurologic Normal speech. Oriented to  person, place, and time. Epicritic sensation to light touch grossly present bilaterally.  Dermatologic Nails well groomed and normal in appearance. No open wounds. No skin lesions.  Orthopedic:  Clinically bilateral plantar fat pad atrophy noted to both forefoot.  Metatarsalgia noted clinically to both forefoot.  No pain with range of motion of the metatarsophalangeal joint bilaterally.  No deep intra-articular MTPJ joint pain noted to any of the joints bilaterally.  No extensor or flexor tendinitis noted.   Radiographs: 3 views of skeletally mature adult bilateral foot severe osteoarthritic changes noted of the first and second metatarsal  phalangeal joint.  Medial deviation of the digits noted.  History of prior surgery.  Cavovarus foot structure noted bilaterally.  Severe pes planovalgus noted.  Midfoot arthritis noted.  No other bony abnormalities identified.  Assessment:   1. Plantar fat pad atrophy   2. Morton metatarsalgia, bilateral    Plan:  Patient was evaluated and treated and all questions answered.  Bilateral plantar fat pad atrophy with underlying metatarsalgia -I explained the patient the etiology of plantar fat pad atrophy and various treatment options were discussed.  Given the amount of pain that she is having across the ball of the foot secondary to plantar fat pad atrophy believe patient would benefit from metatarsalgia pads.  I discussed with her that she will benefit from metatarsal pads.  Metatarsal pads were dispensed.  If there is a benefit we can discuss orthotics at that time.  She states understanding. -Metatarsal pads were dispensed  No follow-ups on file.

## 2021-01-13 ENCOUNTER — Ambulatory Visit: Payer: BC Managed Care – PPO | Admitting: Hematology

## 2021-01-13 ENCOUNTER — Other Ambulatory Visit: Payer: BC Managed Care – PPO

## 2021-01-16 ENCOUNTER — Telehealth: Payer: Self-pay | Admitting: Podiatry

## 2021-01-16 MED ORDER — IBUPROFEN 800 MG PO TABS: 800.0000 mg | ORAL_TABLET | Freq: Four times a day (QID) | ORAL | 1 refills | Status: DC | PRN

## 2021-01-16 NOTE — Addendum Note (Signed)
Addended by: Boneta Lucks on: 01/16/2021 10:19 AM   Modules accepted: Orders

## 2021-01-16 NOTE — Telephone Encounter (Signed)
Patient called and stated that she is in pain and she would like Dr. Posey Pronto to send in some arthritis medication other than mobic and less expensive.

## 2021-01-24 ENCOUNTER — Ambulatory Visit (INDEPENDENT_AMBULATORY_CARE_PROVIDER_SITE_OTHER): Payer: Medicare Other | Admitting: Podiatry

## 2021-01-24 ENCOUNTER — Other Ambulatory Visit: Payer: Self-pay

## 2021-01-24 ENCOUNTER — Telehealth: Payer: Self-pay

## 2021-01-24 DIAGNOSIS — Q666 Other congenital valgus deformities of feet: Secondary | ICD-10-CM

## 2021-01-24 DIAGNOSIS — M7752 Other enthesopathy of left foot: Secondary | ICD-10-CM

## 2021-01-24 DIAGNOSIS — G5763 Lesion of plantar nerve, bilateral lower limbs: Secondary | ICD-10-CM | POA: Diagnosis not present

## 2021-01-24 DIAGNOSIS — M7751 Other enthesopathy of right foot: Secondary | ICD-10-CM

## 2021-01-24 DIAGNOSIS — Z638 Other specified problems related to primary support group: Secondary | ICD-10-CM

## 2021-01-24 NOTE — Telephone Encounter (Signed)
Pt. Called LM stating she has switched pharmacy's to [facility] in [place] and she needs refills on her Alprazolam and Triazolam

## 2021-01-25 MED ORDER — TRIAZOLAM 0.25 MG PO TABS: ORAL_TABLET | ORAL | 0 refills | Status: DC

## 2021-01-25 MED ORDER — ALPRAZOLAM 0.25 MG PO TABS
ORAL_TABLET | ORAL | 1 refills | Status: DC
Start: 2021-01-25 — End: 2021-08-21

## 2021-01-27 ENCOUNTER — Other Ambulatory Visit: Payer: BC Managed Care – PPO

## 2021-01-27 ENCOUNTER — Ambulatory Visit: Payer: BC Managed Care – PPO | Admitting: Hematology

## 2021-01-29 ENCOUNTER — Encounter: Payer: Self-pay | Admitting: Podiatry

## 2021-01-29 NOTE — Progress Notes (Signed)
Subjective:  Patient ID: Denise [name], female    DOB: [birth year],  MRN: [id number]  Chief Complaint  Patient presents with   Arthritis    Pt stated that she is still having some pain with her feet    65 y.o. female presents with the above complaint.  Patient presents with complaint of bilateral metatarsalgia pain follow-up.  She states she is doing a lot better with the padding.  She states that the pain has been more localized to the second metatarsal phalangeal joint bilaterally.  Pain with ambulation.  She would like to discuss treatment options.  Pain is dull aching nature.  Pain is 7 out of 10.  Hurts on hardwood floor.   Review of Systems: Negative except as noted in the HPI. Denies N/V/F/Ch.  Past Medical History:  Diagnosis Date   Allergy    Asthma    Bursitis of left hip    Fibroids    TAH in 1999   GERD (gastroesophageal reflux disease)    Lymphoma ([facility])    T cell lymphoma   PVC's (premature ventricular contractions)    Sleep apnea    does not use CPAP    Current Outpatient Medications:    albuterol (VENTOLIN HFA) 108 (90 Base) MCG/ACT inhaler, INHALE 2 PUFFS INTO THE LUNGS EVERY 6 HOURS AS NEEDED FOR WHEEZING OR SHORTNESS OF BREATH, Disp: 18 g, Rfl: 0   ALPRAZolam (XANAX) 0.25 MG tablet, TAKE 1 TABLET(0.25 MG) BY MOUTH TWICE DAILY AS NEEDED, Disp: 50 tablet, Rfl: 1   aspirin 81 MG chewable tablet, Chew 81 mg by mouth daily as needed for moderate pain. , Disp: , Rfl:    atenolol (TENORMIN) 50 MG tablet, Take 1 tablet (50 mg total) by mouth 2 (two) times daily., Disp: 180 tablet, Rfl: 3   esomeprazole (NEXIUM) 40 MG capsule, TAKE 1 CAPSULE(40 MG) BY MOUTH DAILY BEFORE BREAKFAST (Patient not taking: No sig reported), Disp: 90 capsule, Rfl: 0   fluticasone (FLONASE) 50 MCG/ACT nasal spray, Place into the nose., Disp: , Rfl:    fluticasone-salmeterol (WIXELA INHUB) 100-50 MCG/ACT AEPB, Inhale 1 puff into the lungs 2 (two) times daily., Disp: 3 each, Rfl: 3    hydrochlorothiazide (MICROZIDE) 12.5 MG capsule, TAKE 1 CAPSULE BY MOUTH EVERY DAY, Disp: 90 capsule, Rfl: 3   ibuprofen (ADVIL) 800 MG tablet, Take 1 tablet (800 mg total) by mouth every 6 (six) hours as needed., Disp: 60 tablet, Rfl: 1   meloxicam (MOBIC) 15 MG tablet, TAKE 1 TABLET BY MOUTH EVERY DAY AS NEEDED FOR MUSCLE SPASMS (Patient not taking: Reported on 12/27/2020), Disp: 90 tablet, Rfl: 1   ondansetron (ZOFRAN) 4 MG tablet, TAKE 1 TABLET(4 MG) BY MOUTH EVERY 8 HOURS AS NEEDED FOR NAUSEA OR VOMITING (Patient not taking: Reported on 12/27/2020), Disp: 12 tablet, Rfl: 0   Probiotic Product (PROBIOTIC DAILY PO), Take 1 capsule by mouth daily., Disp: , Rfl:    triazolam (HALCION) 0.25 MG tablet, TAKE 1 TABLET(0.25 MG) BY MOUTH AT BEDTIME AS NEEDED FOR SLEEP, Disp: 90 tablet, Rfl: 0  Social History   Tobacco Use  Smoking Status Never  Smokeless Tobacco Never    Allergies  Allergen Reactions   Morphine And Related    Objective:  There were no vitals filed for this visit. There is no height or weight on file to calculate BMI. Constitutional Well developed. Well nourished.  Vascular Dorsalis pedis pulses palpable bilaterally. Posterior tibial pulses palpable bilaterally. Capillary refill normal to all digits.  No cyanosis or clubbing noted. Pedal hair growth normal.  Neurologic Normal speech. Oriented to person, place, and time. Epicritic sensation to light touch grossly present bilaterally.  Dermatologic Nails well groomed and normal in appearance. No open wounds. No skin lesions.  Orthopedic:  Clinically healing bilateral plantar fat pad atrophy noted to both forefoot.  Mild metatarsalgia noted clinically to both forefoot.  No pain with range of motion of the metatarsophalangeal joint bilaterally.  No deep intra-articular MTPJ joint pain noted to any of the joints bilaterally.  No extensor or flexor tendinitis noted.  Pain on palpation to the range of motion of bilateral second  metatarsophalangeal joint.  No deep intra-articular pain noted.  No plantar plate involvement noted.  Negative Mulder's click noted.  No signs of neuroma noted.   Radiographs: 3 views of skeletally mature adult bilateral foot severe osteoarthritic changes noted of the first and second metatarsal phalangeal joint.  Medial deviation of the digits noted.  History of prior surgery.  Cavovarus foot structure noted bilaterally.  Severe pes planovalgus noted.  Midfoot arthritis noted.  No other bony abnormalities identified.  Assessment:   1. Pes planovalgus   2. Morton metatarsalgia, bilateral   3. Capsulitis of metatarsophalangeal (MTP) joint of right foot   4. Capsulitis of metatarsophalangeal (MTP) joint of left foot     Plan:  Patient was evaluated and treated and all questions answered.  Bilateral plantar fat pad atrophy with underlying metatarsalgia with underlying pes planovalgus -Clinically doing well with metatarsal pads.  Given clinically patient states that the metatarsal pads help I believe she will benefit from custom-made orthotics to help control the hindfoot motion support the arch of this of the foot take the stress away from the forefoot therefore relieving her metatarsalgia.  She will also benefit from incorporation of metatarsal pads.  She agrees with the plan would like to proceed with orthotics -She was casted for orthotics  Bilateral second metatarsophalangeal joint capsulitis -Explained the patient etiology of capsulitis and various treatment options were extensively discussed.  Given the amount of pain that she is having I believe she will benefit from a steroid injection of decrease acute inflammatory component associated pain.  She states understanding like to proceed with a steroid injection. -A steroid injection was performed at second metatarsophalangeal joint bilateral using 1% plain Lidocaine and 10 mg of Kenalog. This was well tolerated.   No follow-ups on file.

## 2021-01-31 ENCOUNTER — Other Ambulatory Visit: Payer: Self-pay | Admitting: *Deleted

## 2021-01-31 DIAGNOSIS — C8478 Anaplastic large cell lymphoma, ALK-negative, lymph nodes of multiple sites: Secondary | ICD-10-CM

## 2021-02-03 ENCOUNTER — Telehealth: Payer: Self-pay | Admitting: Hematology

## 2021-02-03 ENCOUNTER — Inpatient Hospital Stay: Payer: BC Managed Care – PPO | Admitting: Hematology

## 2021-02-03 ENCOUNTER — Inpatient Hospital Stay: Payer: BC Managed Care – PPO

## 2021-02-03 NOTE — Telephone Encounter (Signed)
Rescheduled appointment per provider. Patient is aware. 

## 2021-02-11 ENCOUNTER — Other Ambulatory Visit: Payer: Self-pay | Admitting: Family Medicine

## 2021-02-11 DIAGNOSIS — J4530 Mild persistent asthma, uncomplicated: Secondary | ICD-10-CM

## 2021-02-11 DIAGNOSIS — I10 Essential (primary) hypertension: Secondary | ICD-10-CM

## 2021-02-11 NOTE — Telephone Encounter (Signed)
[facility] is requesting to fill pt wixela. Please advise [facility]

## 2021-02-17 ENCOUNTER — Telehealth: Payer: Self-pay

## 2021-02-17 NOTE — Telephone Encounter (Signed)
Pt called to advise her med should go to [facility] [place]

## 2021-02-19 ENCOUNTER — Telehealth: Payer: Self-pay | Admitting: Podiatry

## 2021-02-19 ENCOUNTER — Ambulatory Visit: Payer: BC Managed Care – PPO | Admitting: Podiatry

## 2021-02-19 NOTE — Telephone Encounter (Signed)
Orthotics in.. lvm for pt to call to schedule an appt to pick them up. °

## 2021-03-10 NOTE — Progress Notes (Signed)
HEMATOLOGY/ONCOLOGY CLINIC NOTE  Date of Service: 03/10/21     Patient Care Team: Denita Lung, MD as PCP - General (Family Medicine) Dr. 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 Valtrex resolved the shingles but she continued to have progressive adenopathy, cough and an unintentional weight loss. On 10/15/2016, a CT scan showed bulky adenopathy in the supraclavicular, mediastinum, hilar and abdomen. On 10/21/16, an ultrasound guided core biopsy of the left supraclavicular lymph node showed a T cell lymphoproliferative process with the differential diagnosis most suggestive of a CD30 positive PTCL, NOS or ALK negative anaplastic lymphoma. A PET scan on 10/27/16, redemonstrated bulky hypermetabolic lymphadenopathy throughout the body and a hypermetabolic mass in the central lung with multiple nodules and bilateral pleural effusions. The highest SUV was in the left para-aortic region at 21.4.    In early April 2018 , she started chemotherapy with CHOEP. Interim scans showed nearly completely resolved adenopathy, [place] 1. Completed 5 cycles with CR1 prior to transplant.  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 Hydration and BCNU 300 mg/m2 in 500 ml D5W over 2 hours today. 08/30 - Day -5.  Cytarabine 200 mg/m2 in 250 ml NS over 1 hour and etoposide 200 mg/m2 in 1 L NS over 3 hours. 08/31 - Day -4. Cytarabine 200 mg/m2 in 250 ml NS over 1 hour and etoposide 200 mg/m2 in 1 L NS over 3 hours. 09/01 - Day -3. Cytarabine 200 mg/m2 in 250 ml NS over 1 hour and etoposide 200 mg/m2 in 1 L NS over 3 hours. 09/02 - Day -2. Cytarabine 200 mg/m2 in 250 ml NS over 1 hour and etoposide 200 mg/m2 in 1 L NS over 3 hours. 09/03 - Day -1. Melphalan 140 mg/m2 in 100 ml NS over 30 minutes.    04/13/2017 Transplant  Outpatient Autologous stem cell transplant - Day 0 on 04/13/2017  ASBMT risk stratification: low risk ECOG/KPS: 1/Able to carry on normal activities; minor signs/symptoms of disease HSCT-CI score: CR1 Preparative regimen/treatment protocol: BEAM  Engraftment:  WBC on 04/23/2017 with WBC 1.8 and ANC 1.1 Platelets: Anticipated for 9/21 - last platelet transfusion on 4/78/2956   09/10/3084 Complications  Transplant complications: Cx negative neutropenic fever - admitted to inpatient service and started on broad spectrum Zosyn which she continued until WBC engraftment on 04/23/2017  Mucositis: Grade II - managed with MMW    INTERVAL HISTORY  Denise [name] is here for her scheduled follow-up for her 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 The pt completed CHOEP followed by a preparative regimen for transplant April 2018-04/12/17. Autologous transplant was 04/13/17. The patient's last visit with [name] was on 01/29/2020. The pt reports that she is doing well overall.  The pt reports no acute new medical concerns over the last 12 months. She has been doing well and has  no acute new symptoms.  No fevers/chills/night sweats unexpected weight loss.   Lab results today 03/11/2021 of CBC w/diff and CMP - unremarkable.  On review of systems, pt reprts no other acute symptoms.   MEDICAL HISTORY:  Past Medical History:  Diagnosis Date   Allergy    Asthma    Bursitis of left hip     Fibroids    TAH in 1999   GERD (gastroesophageal reflux disease)    Lymphoma ([place])    T cell lymphoma   PVC's (premature ventricular contractions)    Sleep apnea    does not use CPAP    SURGICAL HISTORY: Past Surgical History:  Procedure Laterality Date   CARDIAC CATH  05/2007   J. [facility]Y   HAMMER TOE SURGERY Bilateral 2016   IR GENERIC HISTORICAL  11/05/2016   IR US GUIDE VASC ACCESS RIGHT 11/05/2016 [name], MD [facility]   IR GENERIC HISTORICAL  11/05/2016   IR FLUORO GUIDE PORT INSERTION RIGHT 11/05/2016 [name], MD [facility]   TOTAL ABDOMINAL HYSTERECTOMY  05/15/1998   secondary to fibroids   WISDOM TOOTH EXTRACTION  mid teens    SOCIAL HISTORY: Social History   Socioeconomic History   Marital status: Significant Other    Spouse name: Not on file   Number of children: 0   Years of education: Not on file   Highest education level: Not on file  Occupational History   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[employer]: [employer]  Tobacco Use   Smoking status: Never   Smokeless tobacco: Never  Vaping Use   Vaping Use: Never used  Substance and Sexual Activity   Alcohol use: No   Drug use: No   Sexual activity: Yes    Partners: Female    Birth control/protection: None  Other Topics Concern   Not on file  Social History Narrative   Not on file   Social Determinants of Health   Financial Resource Strain: Not on file  Food Insecurity: Not on file  Transportation Needs: Not on file  Physical Activity: Not on file  Stress: Not on file  Social Connections: Not on file  Intimate Partner Violence: Not on file    FAMILY HISTORY: Family History  Problem Relation Age of Onset   Cancer Maternal Grandmother        GI cancer in 32's   Dementia Mother    Heart disease Father    Heart failure Father     ALLERGIES:  is allergic to morphine and related.  MEDICATIONS:  Current Outpatient Medications  Medication Sig  Dispense Refill   albuterol (VENTOLIN HFA) 108 (90 Base) MCG/ACT inhaler INHALE 2 PUFFS INTO THE LUNGS EVERY 6 HOURS AS NEEDED FOR WHEEZING OR SHORTNESS OF BREATH 18 g 0   ALPRAZolam (XANAX) 0.25 MG tablet TAKE 1 TABLET(0.25 MG) BY MOUTH TWICE DAILY AS NEEDED 50 tablet 1   aspirin 81 MG chewable tablet Chew 81 mg by mouth daily as needed for moderate pain.      atenolol (TENORMIN) 50 MG tablet TAKE 1 TABLET BY MOUTH TWICE DAILY 180 tablet 1   esomeprazole (NEXIUM) 40 MG capsule TAKE 1 CAPSULE(40 MG) BY MOUTH DAILY BEFORE BREAKFAST (Patient not taking: No sig reported) 90 capsule 0   fluticasone (FLONASE) 50 MCG/ACT nasal spray Place into the nose.     fluticasone-salmeterol (ADVAIR) 100-50 MCG/ACT AEPB INHALE 1 PUFF INTO THE LUNGS TWICE DAILY 60 each 5  hydrochlorothiazide (MICROZIDE) 12.5 MG capsule TAKE 1 CAPSULE BY MOUTH EVERY DAY 90 capsule 3   ibuprofen (ADVIL) 800 MG tablet Take 1 tablet (800 mg total) by mouth every 6 (six) hours as needed. 60 tablet 1   meloxicam (MOBIC) 15 MG tablet TAKE 1 TABLET BY MOUTH EVERY DAY AS NEEDED FOR MUSCLE SPASMS (Patient not taking: Reported on 12/27/2020) 90 tablet 1   ondansetron (ZOFRAN) 4 MG tablet TAKE 1 TABLET(4 MG) BY MOUTH EVERY 8 HOURS AS NEEDED FOR NAUSEA OR VOMITING (Patient not taking: Reported on 12/27/2020) 12 tablet 0   Probiotic Product (PROBIOTIC DAILY PO) Take 1 capsule by mouth daily.     triazolam (HALCION) 0.25 MG tablet TAKE 1 TABLET(0.25 MG) BY MOUTH AT BEDTIME AS NEEDED FOR SLEEP 90 tablet 0   No current facility-administered medications for this visit.    REVIEW OF SYSTEMS:   10 Point review of Systems was done is negative except as noted above.    PHYSICAL EXAMINATION:  ECOG FS:0 - Asymptomatic  Vitals:   03/11/21 1454  BP: 140/71  Pulse: 63  Resp: 17  Temp: 98 F (36.7 C)  SpO2: 99%    Wt Readings from Last 3 Encounters:  12/27/20 189 lb 9.6 oz (86 kg)  03/26/20 189 lb 3.2 oz (85.8 kg)  01/29/20 190 lb (86.2 kg)    Body mass index is 33.96 kg/m.    NAD GENERAL:alert, in no acute distress and comfortable SKIN: no acute rashes, no significant lesions EYES: conjunctiva are pink and non-injected, sclera anicteric OROPHARYNX: MMM, no exudates, no oropharyngeal erythema or ulceration NECK: supple, no JVD LYMPH:  no palpable lymphadenopathy in the cervical, axillary or inguinal regions LUNGS: clear to auscultation b/l with normal respiratory effort HEART: regular rate & rhythm ABDOMEN:  normoactive bowel sounds , non tender, not distended. No palpable hepatosplenomegaly.  Extremity: no pedal edema PSYCH: alert & oriented x 3 with fluent speech NEURO: no focal motor/sensory deficits  LABORATORY DATA:  I have reviewed the data as listed  . CBC Latest Ref Rng & Units 03/11/2021 12/27/2020 01/29/2020  WBC 4.0 - 10.5 K/uL 8.4 7.1 5.8  Hemoglobin 12.0 - 15.0 g/dL 13.4 14.2 13.3  Hematocrit 36.0 - 46.0 % 38.9 42.3 40.0  Platelets 150 - 400 K/uL 145(L) 162 145(L)   . CMP Latest Ref Rng & Units 03/11/2021 12/27/2020 01/29/2020  Glucose 70 - 99 mg/dL 98 101(H) 116(H)  BUN 8 - 23 mg/dL '13 12 11  ' Creatinine 0.44 - 1.00 mg/dL 1.02(H) 0.65 0.76  Sodium 135 - 145 mmol/L 139 139 139  Potassium 3.5 - 5.1 mmol/L 3.8 4.1 4.2  Chloride 98 - 111 mmol/L 102 101 102  CO2 22 - 32 mmol/L '29 24 28  ' Calcium 8.9 - 10.3 mg/dL 9.2 9.3 9.0  Total Protein 6.5 - 8.1 g/dL 6.7 6.9 6.7  Total Bilirubin 0.3 - 1.2 mg/dL 0.3 0.7 0.6  Alkaline Phos 38 - 126 U/L 81 85 88  AST 15 - 41 U/L '21 21 17  ' ALT 0 - 44 U/L '21 16 17         ' RADIOGRAPHIC STUDIES: I have personally reviewed the radiological images as listed and agreed with the findings in the report. No results found.   PET Lymphoma [facility] (with/ low dose CT) , 07/22/17 IMPRESSION: 1. Mildly hypermetabolic bilateral pulmonary airspace opacities that are favored to represent infection versus complications of stem cell transplant. Follow-up with diagnostic CT may be  helpful to distinguish. 2. No FDG  avid malignant disease at present. Ancillary CT findings as above.  04/14/18 CT C/A/P and NECK:        10/13/18 CT C/A/P and Neck:     04/13/2019   04/13/2019   ASSESSMENT & PLAN:   65 y.o. 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   PET/CT scan shows bulky hypermetabolic lymphadenopathy throughout the neck, chest, and abdomen. 1 cm hypermetabolic pelvic lymph node in right external iliac chain. Hypermetabolic masslike opacity in central right lung with multiple adjacent hypermetabolic nodules. This is suspicious for pulmonary involvement by lymphoma  Fatigue anorexia or night sweats-drenching, and weight loss suggest constitutional type B symptoms.  Hepatitis profile and HIV neg  Patient is s/p 5 cycles of CHOEP tolerated well overall and received CHOP for 6th cycle due to significant cytopenias after 5th cycle of CHOEP. PET/CT on 01/08/2017 - showed near completely resolved LNadenopathy.  She received her autologous stem cell transplant with Dr Duanne Moron [facility] on 04/13/2017 which she has tolerated well and has recovered well with resolved cytopenias.  -PET scan on 07/22/2017 with results of: No FDG avid malignant disease at present.  04/14/18 CT C/A/P and Neck revealed No evidence of disease return of progression    #2 history of PVCs -On chronic beta blocker therapy.  #3 history of sleep apnea -continue CPAP use    PLAN: -Discussed pt labwork today, 03/11/2021; cbc, cmp unremarkable. -No lab or clinical evidence of Anaplastic Large T-cell Lymphoma recurrence at this time.   -Pt is over 4 years out from transplant. No indication for additional treatment at this time.  -Will see back in 12 months -discussed staying up to speed with apprpriate vaccinations and age appropriate cancer screening with PCP.  FOLLOW UP: RTC with Dr Irene Limbo  with labs in 12 months   The total time spent in the appt was 20 minutes and more than 50% was on counseling and direct patient cares.  All of the patient's questions were answered with apparent satisfaction. The patient knows to call the clinic with any problems, questions or concerns.    Sullivan Lone MD [place] AAHIVMS [facility] Hematology/Oncology Physician [facility]  (Office):       [phone] (Work cell):  [phone] (Fax):           [phone]  I, Reinaldo Raddle, am acting as scribe for Dr. Sullivan Lone, MD.      .I have reviewed the above documentation for accuracy and completeness, and I agree with the above. Brunetta Genera MD

## 2021-03-11 ENCOUNTER — Inpatient Hospital Stay (HOSPITAL_BASED_OUTPATIENT_CLINIC_OR_DEPARTMENT_OTHER): Payer: Medicare Other | Admitting: Hematology

## 2021-03-11 ENCOUNTER — Inpatient Hospital Stay: Payer: Medicare Other | Attending: Hematology

## 2021-03-11 ENCOUNTER — Other Ambulatory Visit: Payer: Self-pay

## 2021-03-11 VITALS — BP 140/71 | HR 63 | Temp 98.0°F | Resp 17 | Ht 63.0 in | Wt 191.7 lb

## 2021-03-11 DIAGNOSIS — C8478 Anaplastic large cell lymphoma, ALK-negative, lymph nodes of multiple sites: Secondary | ICD-10-CM

## 2021-03-11 DIAGNOSIS — Z8572 Personal history of non-Hodgkin lymphomas: Secondary | ICD-10-CM | POA: Diagnosis not present

## 2021-03-11 LAB — CMP (CANCER CENTER ONLY)
ALT: 21 U/L (ref 0–44)
AST: 21 U/L (ref 15–41)
Albumin: 3.7 g/dL (ref 3.5–5.0)
Alkaline Phosphatase: 81 U/L (ref 38–126)
Anion gap: 8 (ref 5–15)
BUN: 13 mg/dL (ref 8–23)
CO2: 29 mmol/L (ref 22–32)
Calcium: 9.2 mg/dL (ref 8.9–10.3)
Chloride: 102 mmol/L (ref 98–111)
Creatinine: 1.02 mg/dL — ABNORMAL HIGH (ref 0.44–1.00)
GFR, Estimated: >60 mL/min (ref >60)
Glucose, Bld: 98 mg/dL (ref 70–99)
Potassium: 3.8 mmol/L (ref 3.5–5.1)
Sodium: 139 mmol/L (ref 135–145)
Total Bilirubin: 0.3 mg/dL (ref 0.3–1.2)
Total Protein: 6.7 g/dL (ref 6.5–8.1)

## 2021-03-11 LAB — CBC WITH DIFFERENTIAL (CANCER CENTER ONLY)
Abs Immature Granulocytes: 0.02 10*3/uL (ref 0.00–0.07)
Basophils Absolute: 0 10*3/uL (ref 0.0–0.1)
Basophils Relative: 0 %
Eosinophils Absolute: 0.1 10*3/uL (ref 0.0–0.5)
Eosinophils Relative: 1 %
HCT: 38.9 % (ref 36.0–46.0)
Hemoglobin: 13.4 g/dL (ref 12.0–15.0)
Immature Granulocytes: 0 %
Lymphocytes Relative: 25 %
Lymphs Abs: 2.1 10*3/uL (ref 0.7–4.0)
MCH: 30 pg (ref 26.0–34.0)
MCHC: 34.4 g/dL (ref 30.0–36.0)
MCV: 87 fL (ref 80.0–100.0)
Monocytes Absolute: 0.7 10*3/uL (ref 0.1–1.0)
Monocytes Relative: 8 %
Neutro Abs: 5.4 10*3/uL (ref 1.7–7.7)
Neutrophils Relative %: 66 %
Platelet Count: 145 10*3/uL — ABNORMAL LOW (ref 150–400)
RBC: 4.47 MIL/uL (ref 3.87–5.11)
RDW: 12.7 % (ref 11.5–15.5)
WBC Count: 8.4 10*3/uL (ref 4.0–10.5)
nRBC: 0 % (ref 0.0–0.2)

## 2021-03-12 ENCOUNTER — Ambulatory Visit (INDEPENDENT_AMBULATORY_CARE_PROVIDER_SITE_OTHER): Payer: Medicare Other | Admitting: Podiatry

## 2021-03-12 DIAGNOSIS — Q666 Other congenital valgus deformities of feet: Secondary | ICD-10-CM | POA: Diagnosis not present

## 2021-03-12 DIAGNOSIS — G5763 Lesion of plantar nerve, bilateral lower limbs: Secondary | ICD-10-CM

## 2021-03-14 NOTE — Progress Notes (Signed)
Subjective:  Patient ID: Denise [name], female    DOB: 04/23/1956,  MRN: [id number]  Chief Complaint  Patient presents with   Foot Pain    PT stated that she is doing great and that the injection helped a lot     65 y.o. female presents with the above complaint.  Patient presents with complaint of bilateral metatarsalgia pain follow-up.  She states she is doing a lot better.  She would like to discuss treatment options if any.  The injection helped considerably.  She denies any other acute complaints.  Review of Systems: Negative except as noted in the HPI. Denies N/V/F/Ch.  Past Medical History:  Diagnosis Date   Allergy    Asthma    Bursitis of left hip    Fibroids    TAH in 1999   GERD (gastroesophageal reflux disease)    Lymphoma ([place])    T cell lymphoma   PVC's (premature ventricular contractions)    Sleep apnea    does not use CPAP    Current Outpatient Medications:    albuterol (VENTOLIN HFA) 108 (90 Base) MCG/ACT inhaler, INHALE 2 PUFFS INTO THE LUNGS EVERY 6 HOURS AS NEEDED FOR WHEEZING OR SHORTNESS OF BREATH, Disp: 18 g, Rfl: 0   ALPRAZolam (XANAX) 0.25 MG tablet, TAKE 1 TABLET(0.25 MG) BY MOUTH TWICE DAILY AS NEEDED, Disp: 50 tablet, Rfl: 1   aspirin 81 MG chewable tablet, Chew 81 mg by mouth daily as needed for moderate pain. , Disp: , Rfl:    atenolol (TENORMIN) 50 MG tablet, TAKE 1 TABLET BY MOUTH TWICE DAILY, Disp: 180 tablet, Rfl: 1   fluticasone (FLONASE) 50 MCG/ACT nasal spray, Place into the nose., Disp: , Rfl:    fluticasone-salmeterol (ADVAIR) 100-50 MCG/ACT AEPB, INHALE 1 PUFF INTO THE LUNGS TWICE DAILY, Disp: 60 each, Rfl: 5   hydrochlorothiazide (MICROZIDE) 12.5 MG capsule, TAKE 1 CAPSULE BY MOUTH EVERY DAY, Disp: 90 capsule, Rfl: 3   ibuprofen (ADVIL) 800 MG tablet, Take 1 tablet (800 mg total) by mouth every 6 (six) hours as needed., Disp: 60 tablet, Rfl: 1   Probiotic Product (PROBIOTIC DAILY PO), Take 1 capsule by mouth daily., Disp: , Rfl:     triazolam (HALCION) 0.25 MG tablet, TAKE 1 TABLET(0.25 MG) BY MOUTH AT BEDTIME AS NEEDED FOR SLEEP, Disp: 90 tablet, Rfl: 0  Social History   Tobacco Use  Smoking Status Never  Smokeless Tobacco Never    Allergies  Allergen Reactions   Morphine And Related    Objective:  There were no vitals filed for this visit. There is no height or weight on file to calculate BMI. Constitutional Well developed. Well nourished.  Vascular Dorsalis pedis pulses palpable bilaterally. Posterior tibial pulses palpable bilaterally. Capillary refill normal to all digits.  No cyanosis or clubbing noted. Pedal hair growth normal.  Neurologic Normal speech. Oriented to person, place, and time. Epicritic sensation to light touch grossly present bilaterally.  Dermatologic Nails well groomed and normal in appearance. No open wounds. No skin lesions.  Orthopedic:  Clinically healing bilateral plantar fat pad atrophy noted to both forefoot.  Mild metatarsalgia noted clinically to both forefoot.  No pain with range of motion of the metatarsophalangeal joint bilaterally.  No deep intra-articular MTPJ joint pain noted to any of the joints bilaterally.  No extensor or flexor tendinitis noted.  No further pain on palpation to the range of motion of bilateral second metatarsophalangeal joint.  No deep intra-articular pain noted.  No plantar plate  involvement noted.  Negative Mulder's click noted.  No signs of neuroma noted.   Radiographs: 3 views of skeletally mature adult bilateral foot severe osteoarthritic changes noted of the first and second metatarsal phalangeal joint.  Medial deviation of the digits noted.  History of prior surgery.  Cavovarus foot structure noted bilaterally.  Severe pes planovalgus noted.  Midfoot arthritis noted.  No other bony abnormalities identified.  Assessment:   1. Pes planovalgus   2. [place] metatarsalgia, bilateral      Plan:  Patient was evaluated and treated and all  questions answered.  Bilateral plantar fat pad atrophy with underlying metatarsalgia with underlying pes planovalgus -Clinically doing well with metatarsal pads.  Given clinically patient states that the metatarsal pads help I believe she will benefit from custom-made orthotics to help control the hindfoot motion support the arch of this of the foot take the stress away from the forefoot therefore relieving her metatarsalgia.  She will also benefit from incorporation of metatarsal pads.  She agrees with the plan would like to proceed with orthotics -Orthotics were dispensed and seems to be functioning well.  Bilateral second metatarsophalangeal joint capsulitis -Clinically resolved with a steroid injection.  I encouraged her to make shoe gear modification as well as wear the orthotics.  Offloading submetatarsal 2 bilaterally noted.   No follow-ups on file.

## 2021-03-28 ENCOUNTER — Telehealth: Payer: Self-pay | Admitting: Family Medicine

## 2021-03-28 DIAGNOSIS — Z1231 Encounter for screening mammogram for malignant neoplasm of breast: Secondary | ICD-10-CM | POA: Diagnosis not present

## 2021-03-28 DIAGNOSIS — J4530 Mild persistent asthma, uncomplicated: Secondary | ICD-10-CM

## 2021-03-28 MED ORDER — FLUTICASONE-SALMETEROL 100-50 MCG/ACT IN AEPB: INHALATION_SPRAY | RESPIRATORY_TRACT | 5 refills | Status: DC

## 2021-03-28 NOTE — Telephone Encounter (Signed)
Pt is requesting refill on Wixela inhaler sent to [facility]

## 2021-04-10 DIAGNOSIS — R922 Inconclusive mammogram: Secondary | ICD-10-CM | POA: Diagnosis not present

## 2021-04-10 LAB — HM MAMMOGRAPHY

## 2021-05-19 ENCOUNTER — Telehealth: Payer: Self-pay | Admitting: Family Medicine

## 2021-05-19 NOTE — Telephone Encounter (Signed)
Pt is requesting refill on Zofran sent to [facility]

## 2021-05-20 MED ORDER — ONDANSETRON 4 MG PO TBDP: 4.0000 mg | ORAL_TABLET | Freq: Three times a day (TID) | ORAL | 0 refills | Status: DC | PRN

## 2021-05-20 NOTE — Telephone Encounter (Signed)
She said the one for nausea and she only takes it as needed. Its listed in her history meds.

## 2021-05-20 NOTE — Telephone Encounter (Signed)
Pt called and states medication is ondansetron and she was originally given 01/17/2020. She takes it "once in a blue moon for nausea". Pt uses [facility].

## 2021-05-28 DIAGNOSIS — L820 Inflamed seborrheic keratosis: Secondary | ICD-10-CM | POA: Diagnosis not present

## 2021-05-28 DIAGNOSIS — L570 Actinic keratosis: Secondary | ICD-10-CM | POA: Diagnosis not present

## 2021-05-28 DIAGNOSIS — L821 Other seborrheic keratosis: Secondary | ICD-10-CM | POA: Diagnosis not present

## 2021-05-28 DIAGNOSIS — D225 Melanocytic nevi of trunk: Secondary | ICD-10-CM | POA: Diagnosis not present

## 2021-06-11 ENCOUNTER — Other Ambulatory Visit: Payer: Self-pay

## 2021-06-11 ENCOUNTER — Ambulatory Visit (INDEPENDENT_AMBULATORY_CARE_PROVIDER_SITE_OTHER): Payer: Medicare Other | Admitting: Podiatry

## 2021-06-11 DIAGNOSIS — M7751 Other enthesopathy of right foot: Secondary | ICD-10-CM

## 2021-06-11 DIAGNOSIS — M7752 Other enthesopathy of left foot: Secondary | ICD-10-CM

## 2021-06-13 ENCOUNTER — Encounter: Payer: Self-pay | Admitting: Podiatry

## 2021-06-13 NOTE — Progress Notes (Signed)
Subjective:  Patient ID: Denise [name], female    DOB: Apr 16, 1956,  MRN: [id number]  Chief Complaint  Patient presents with   Injections    Bilateral foot injections     65 y.o. female presents with the above complaint.  Patient presents with complaint of bilateral second metatarsophalangeal joint pain.  Patient states the pain came back.  The injection has been helping.  She denies any other acute complaint she would like to know if she can do another injection.   Review of Systems: Negative except as noted in the HPI. Denies N/V/F/Ch.  Past Medical History:  Diagnosis Date   Allergy    Asthma    Bursitis of left hip    Fibroids    TAH in 1999   GERD (gastroesophageal reflux disease)    Lymphoma ([place])    T cell lymphoma   PVC's (premature ventricular contractions)    Sleep apnea    does not use CPAP    Current Outpatient Medications:    albuterol (VENTOLIN HFA) 108 (90 Base) MCG/ACT inhaler, INHALE 2 PUFFS INTO THE LUNGS EVERY 6 HOURS AS NEEDED FOR WHEEZING OR SHORTNESS OF BREATH, Disp: 18 g, Rfl: 0   ALPRAZolam (XANAX) 0.25 MG tablet, TAKE 1 TABLET(0.25 MG) BY MOUTH TWICE DAILY AS NEEDED, Disp: 50 tablet, Rfl: 1   aspirin 81 MG chewable tablet, Chew 81 mg by mouth daily as needed for moderate pain. , Disp: , Rfl:    atenolol (TENORMIN) 50 MG tablet, TAKE 1 TABLET BY MOUTH TWICE DAILY, Disp: 180 tablet, Rfl: 1   fluticasone (FLONASE) 50 MCG/ACT nasal spray, Place into the nose., Disp: , Rfl:    fluticasone-salmeterol (ADVAIR) 100-50 MCG/ACT AEPB, INHALE 1 PUFF INTO THE LUNGS TWICE DAILY, Disp: 60 each, Rfl: 5   hydrochlorothiazide (MICROZIDE) 12.5 MG capsule, TAKE 1 CAPSULE BY MOUTH EVERY DAY, Disp: 90 capsule, Rfl: 3   ibuprofen (ADVIL) 800 MG tablet, Take 1 tablet (800 mg total) by mouth every 6 (six) hours as needed., Disp: 60 tablet, Rfl: 1   ondansetron (ZOFRAN ODT) 4 MG disintegrating tablet, Take 1 tablet (4 mg total) by mouth every 8 (eight) hours as needed for  nausea or vomiting., Disp: 12 tablet, Rfl: 0   Probiotic Product (PROBIOTIC DAILY PO), Take 1 capsule by mouth daily., Disp: , Rfl:    triazolam (HALCION) 0.25 MG tablet, TAKE 1 TABLET(0.25 MG) BY MOUTH AT BEDTIME AS NEEDED FOR SLEEP, Disp: 90 tablet, Rfl: 0  Social History   Tobacco Use  Smoking Status Never  Smokeless Tobacco Never    Allergies  Allergen Reactions   Morphine And Related    Objective:  There were no vitals filed for this visit. There is no height or weight on file to calculate BMI. Constitutional Well developed. Well nourished.  Vascular Dorsalis pedis pulses palpable bilaterally. Posterior tibial pulses palpable bilaterally. Capillary refill normal to all digits.  No cyanosis or clubbing noted. Pedal hair growth normal.  Neurologic Normal speech. Oriented to person, place, and time. Epicritic sensation to light touch grossly present bilaterally.  Dermatologic Nails well groomed and normal in appearance. No open wounds. No skin lesions.  Orthopedic:  Clinically healing bilateral plantar fat pad atrophy noted to both forefoot.  Mild metatarsalgia noted clinically to both forefoot.  No pain with range of motion of the metatarsophalangeal joint bilaterally.  No deep intra-articular MTPJ joint pain noted to any of the joints bilaterally.  No extensor or flexor tendinitis noted.  Pain on palpation  to the range of motion of bilateral second metatarsophalangeal joint.  No deep intra-articular pain noted.  No plantar plate involvement noted.  Negative Mulder's click noted.  No signs of neuroma noted.   Radiographs: 3 views of skeletally mature adult bilateral foot severe osteoarthritic changes noted of the first and second metatarsal phalangeal joint.  Medial deviation of the digits noted.  History of prior surgery.  Cavovarus foot structure noted bilaterally.  Severe pes planovalgus noted.  Midfoot arthritis noted.  No other bony abnormalities identified.  Assessment:    1. Capsulitis of metatarsophalangeal (MTP) joint of right foot   2. Capsulitis of metatarsophalangeal (MTP) joint of left foot      Plan:  Patient was evaluated and treated and all questions answered.  Bilateral plantar fat pad atrophy with underlying metatarsalgia with underlying pes planovalgus -Clinically resolved with orthotics  Bilateral second metatarsophalangeal joint capsulitis~recurrence -Explained the patient etiology of capsulitis and various treatment options were extensively discussed.  Given the amount of pain that she is having I believe she will benefit from a steroid injection of decrease acute inflammatory component associated pain.  She states understanding like to proceed with a steroid injection. -A steroid injection was performed at second metatarsophalangeal joint bilateral using 1% plain Lidocaine and 10 mg of Kenalog. This was well tolerated.   No follow-ups on file.

## 2021-07-01 ENCOUNTER — Telehealth: Payer: Self-pay

## 2021-07-01 DIAGNOSIS — J4530 Mild persistent asthma, uncomplicated: Secondary | ICD-10-CM

## 2021-07-01 NOTE — Telephone Encounter (Signed)
Pt left message needs rescue inhaler (albuterol) called into [facility]

## 2021-07-02 MED ORDER — ALBUTEROL SULFATE HFA 108 (90 BASE) MCG/ACT IN AERS: 2.0000 | INHALATION_SPRAY | Freq: Four times a day (QID) | RESPIRATORY_TRACT | 0 refills | Status: DC | PRN

## 2021-07-10 DIAGNOSIS — H2513 Age-related nuclear cataract, bilateral: Secondary | ICD-10-CM | POA: Diagnosis not present

## 2021-07-10 DIAGNOSIS — H43393 Other vitreous opacities, bilateral: Secondary | ICD-10-CM | POA: Diagnosis not present

## 2021-08-20 ENCOUNTER — Other Ambulatory Visit: Payer: Self-pay | Admitting: Family Medicine

## 2021-08-20 DIAGNOSIS — Z638 Other specified problems related to primary support group: Secondary | ICD-10-CM

## 2021-08-20 NOTE — Telephone Encounter (Signed)
[facility] is requesting to fill pt xanax. [place]

## 2021-09-12 DIAGNOSIS — Z9484 Stem cells transplant status: Secondary | ICD-10-CM | POA: Diagnosis not present

## 2021-09-12 DIAGNOSIS — Z885 Allergy status to narcotic agent status: Secondary | ICD-10-CM | POA: Diagnosis not present

## 2021-09-12 DIAGNOSIS — C8478 Anaplastic large cell lymphoma, ALK-negative, lymph nodes of multiple sites: Secondary | ICD-10-CM | POA: Diagnosis not present

## 2021-09-12 DIAGNOSIS — Z8572 Personal history of non-Hodgkin lymphomas: Secondary | ICD-10-CM | POA: Diagnosis not present

## 2021-09-12 DIAGNOSIS — Z08 Encounter for follow-up examination after completed treatment for malignant neoplasm: Secondary | ICD-10-CM | POA: Diagnosis not present

## 2021-11-03 ENCOUNTER — Telehealth: Payer: Self-pay

## 2021-11-03 ENCOUNTER — Other Ambulatory Visit: Payer: Self-pay | Admitting: Family Medicine

## 2021-11-03 DIAGNOSIS — I10 Essential (primary) hypertension: Secondary | ICD-10-CM

## 2021-11-03 NOTE — Telephone Encounter (Signed)
Pt states she uses [facility] and would like meds sent there  Thanks

## 2021-11-07 ENCOUNTER — Telehealth: Payer: Self-pay | Admitting: Family Medicine

## 2021-11-07 NOTE — Telephone Encounter (Signed)
Pt called and is wanting a recommendation for a place that does eyelid lift surgery ? ?

## 2021-11-10 ENCOUNTER — Telehealth: Payer: Self-pay

## 2021-11-10 NOTE — Telephone Encounter (Signed)
Pt called and would like to know who you would recommend for an eye lid lift, said her partner needs surgery due to it is now effecting her vision.  And she wants to know who you recommend ?

## 2021-11-11 NOTE — Telephone Encounter (Signed)
Refill done.  

## 2021-11-13 NOTE — Telephone Encounter (Signed)
Left message for pt

## 2021-11-24 ENCOUNTER — Other Ambulatory Visit: Payer: Self-pay | Admitting: Family Medicine

## 2021-11-24 DIAGNOSIS — Z638 Other specified problems related to primary support group: Secondary | ICD-10-CM

## 2021-11-24 NOTE — Telephone Encounter (Signed)
[facility] is requesting to fill pt triazolam and xanax.Please advise [place] ?

## 2021-12-24 ENCOUNTER — Other Ambulatory Visit: Payer: Self-pay | Admitting: Podiatry

## 2021-12-30 ENCOUNTER — Telehealth: Payer: Self-pay | Admitting: Family Medicine

## 2021-12-30 NOTE — Telephone Encounter (Signed)
Spoke with patient to schedule awv prior to appt 5/30 with dr Redmond School.   Patient could not schedule will be out of town

## 2022-01-06 ENCOUNTER — Ambulatory Visit (INDEPENDENT_AMBULATORY_CARE_PROVIDER_SITE_OTHER): Payer: Medicare Other | Admitting: Family Medicine

## 2022-01-06 ENCOUNTER — Encounter: Payer: Self-pay | Admitting: Family Medicine

## 2022-01-06 VITALS — BP 128/62 | HR 51 | Temp 97.5°F | Ht 63.0 in | Wt 196.0 lb

## 2022-01-06 DIAGNOSIS — R7301 Impaired fasting glucose: Secondary | ICD-10-CM | POA: Diagnosis not present

## 2022-01-06 DIAGNOSIS — Z1211 Encounter for screening for malignant neoplasm of colon: Secondary | ICD-10-CM

## 2022-01-06 DIAGNOSIS — E669 Obesity, unspecified: Secondary | ICD-10-CM

## 2022-01-06 DIAGNOSIS — C8478 Anaplastic large cell lymphoma, ALK-negative, lymph nodes of multiple sites: Secondary | ICD-10-CM

## 2022-01-06 DIAGNOSIS — J301 Allergic rhinitis due to pollen: Secondary | ICD-10-CM | POA: Diagnosis not present

## 2022-01-06 DIAGNOSIS — R7302 Impaired glucose tolerance (oral): Secondary | ICD-10-CM | POA: Diagnosis not present

## 2022-01-06 DIAGNOSIS — G4730 Sleep apnea, unspecified: Secondary | ICD-10-CM | POA: Diagnosis not present

## 2022-01-06 DIAGNOSIS — R7309 Other abnormal glucose: Secondary | ICD-10-CM

## 2022-01-06 DIAGNOSIS — K219 Gastro-esophageal reflux disease without esophagitis: Secondary | ICD-10-CM

## 2022-01-06 DIAGNOSIS — E7439 Other disorders of intestinal carbohydrate absorption: Secondary | ICD-10-CM

## 2022-01-06 DIAGNOSIS — E2839 Other primary ovarian failure: Secondary | ICD-10-CM

## 2022-01-06 DIAGNOSIS — F419 Anxiety disorder, unspecified: Secondary | ICD-10-CM

## 2022-01-06 DIAGNOSIS — I493 Ventricular premature depolarization: Secondary | ICD-10-CM

## 2022-01-06 DIAGNOSIS — Z23 Encounter for immunization: Secondary | ICD-10-CM | POA: Diagnosis not present

## 2022-01-06 DIAGNOSIS — Z0000 Encounter for general adult medical examination without abnormal findings: Secondary | ICD-10-CM | POA: Diagnosis not present

## 2022-01-06 DIAGNOSIS — Z9484 Stem cells transplant status: Secondary | ICD-10-CM

## 2022-01-06 DIAGNOSIS — M1990 Unspecified osteoarthritis, unspecified site: Secondary | ICD-10-CM

## 2022-01-06 DIAGNOSIS — I10 Essential (primary) hypertension: Secondary | ICD-10-CM

## 2022-01-06 DIAGNOSIS — Z8249 Family history of ischemic heart disease and other diseases of the circulatory system: Secondary | ICD-10-CM

## 2022-01-06 DIAGNOSIS — J4530 Mild persistent asthma, uncomplicated: Secondary | ICD-10-CM

## 2022-01-06 LAB — POCT GLYCOSYLATED HEMOGLOBIN (HGB A1C): Hemoglobin A1C: 6.3 % — AB (ref 4.0–5.6)

## 2022-01-06 MED ORDER — FLUTICASONE-SALMETEROL 100-50 MCG/ACT IN AEPB: INHALATION_SPRAY | RESPIRATORY_TRACT | 5 refills | Status: DC

## 2022-01-06 MED ORDER — ATENOLOL 50 MG PO TABS: 50.0000 mg | ORAL_TABLET | Freq: Two times a day (BID) | ORAL | 3 refills | Status: DC

## 2022-01-06 MED ORDER — HYDROCHLOROTHIAZIDE 12.5 MG PO CAPS: ORAL_CAPSULE | ORAL | 0 refills | Status: DC

## 2022-01-06 NOTE — Patient Instructions (Addendum)
  Denise [name] , Thank you for taking time to come for your Medicare Wellness Visit. I appreciate your ongoing commitment to your health goals. Please review the following plan we discussed and let me know if I can assist you in the future.   These are the goals we discussed: Try Pepcid, Axid or Zantac daily to get the reflux symptoms under control and then back off to every other day and even potentially every third day if that does not work you can double the dose but if that does not work then I need to call me For the prediabetes 20 minutes of something physical daily or 150 minutes week of something physical.  In terms of diet cut back on carbohydrates and the easiest way to remember that is white food   This is a list of the screening recommended for you and due dates:  Health Maintenance  Topic Date Due   Complete foot exam   Never done   Eye exam for diabetics  Never done   Urine Protein Check  Never done   Hemoglobin A1C  06/29/2021   Flu Shot  03/10/2022   Mammogram  04/10/2022   Colon Cancer Screening  03/04/2023   Pneumonia Vaccine (3 - PPSV23 if available, else PCV20) 10/13/2023   Tetanus Vaccine  12/28/2030   DEXA scan (bone density measurement)  Completed   COVID-19 Vaccine  Completed   Hepatitis C Screening: USPSTF Recommendation to screen - Ages 28-79 yo.  Completed   Zoster (Shingles) Vaccine  Completed   HPV Vaccine  Aged Out

## 2022-01-06 NOTE — Progress Notes (Signed)
Denise [name] is a 66 y.o. female who presents for annual wellness visit,CPE and follow-up on chronic medical conditions.  She has difficulty with reflux symptoms and has been using Tums with good results.  She was on a PPI but has stopped that.  She also has a history of PVCs and is taking a atenolol.  There is also a family history of heart disease.  She does have occasional difficulty with anxiety but uses Xanax only once or twice per week.  Her allergies are under good control with OTC meds including Flonase.  She does use ibuprofen for arthritis.  She does have OSA but is intolerant of CPAP.  She would like an updated COVID-vaccine.  She follows up regularly with her gynecologist and has seen podiatry in the past.  She continues to do quite nicely on her Advair and rarely has to use the albuterol inhaler.  She does have a history of glucose intolerance.  Review of the record also indicates questionable need of DEXA scanning.  Otherwise she has no concerns or complaints.  Immunizations and Health Maintenance Immunization History  Administered Date(s) Administered   DTaP / IPV 10/21/2017, 01/13/2018, 04/14/2018   Hepatitis B 10/21/2017, 01/13/2018, 10/13/2018   HiB (PRP-OMP) 10/21/2017, 01/13/2018, 04/14/2018   HiB (PRP-T) 10/21/2017, 01/13/2018, 04/14/2018   Influenza Split 04/19/2019   Influenza Whole 08/05/1999, 04/23/2000, 04/02/2011   Influenza, Seasonal, Injecte, Preservative Fre 08/13/2012   Influenza,inj,Quad PF,6+ Mos 04/19/2019   Influenza-Unspecified 08/13/2012, 06/29/2014, 10/21/2017, 04/19/2019   MMR 04/13/2019   PFIZER Comirnaty(Gray Top)Covid-19 Tri-Sucrose Vaccine 12/27/2020   PFIZER(Purple Top)SARS-COV-2 Vaccination 10/06/2019, 10/27/2019, 05/25/2020   Pfizer Covid-19 Vaccine Bivalent Booster 55yr & up 05/28/2021   Pneumococcal Conjugate-13 10/21/2017, 01/13/2018, 04/14/2018   Pneumococcal Polysaccharide-23 10/13/2018   Td 04/14/1991, 02/03/1999   Tdap 04/02/2011,  12/27/2020   Zoster Recombinat (Shingrix) 01/31/2019, 04/19/2019   Zoster, Unspecified 01/31/2019, 04/19/2019   Health Maintenance Due  Topic Date Due   FOOT EXAM  Never done   OPHTHALMOLOGY EXAM  Never done   URINE MICROALBUMIN  Never done   HEMOGLOBIN A1C  06/29/2021    Last Pap smear: aged out  Last mammogram:04/10/21 Last colonoscopy: 03/03/13 Dr. PFerdinand LangoLast DEXA:  N/A Dentist: Q year Ophtho: Q year Exercise: bike 15 min   Other doctors caring for patient include:[facility].  [facility], podiatry  Advanced directives: Does Patient Have a Medical Advance Directive?: Yes Type of Advance Directive: Living will, Healthcare Power of ANorth Vacheriein Chart?: No - copy requested  Depression screen:  See questionnaire below.     01/06/2022    8:28 AM 12/27/2020   11:21 AM 05/10/2018    4:11 PM  Depression screen PHQ 2/9  Decreased Interest 0 0 0  Down, Depressed, Hopeless 0 1 0  PHQ - 2 Score 0 1 0    Fall Risk Screen: see questionnaire below.    01/06/2022    8:28 AM 12/27/2020   11:21 AM 05/10/2018    4:11 PM  Fall Risk   Falls in the past year? 0 0 No  Number falls in past yr: 0 0   Injury with Fall? 0 0   Risk for fall due to : No Fall Risks No Fall Risks   Follow up Falls evaluation completed      ADL screen:  See questionnaire below Functional Status Survey: Is the patient deaf or have difficulty hearing?: No Does the patient have difficulty seeing, even when wearing glasses/contacts?: No  Does the patient have difficulty concentrating, remembering, or making decisions?: No Does the patient have difficulty walking or climbing stairs?: No Does the patient have difficulty dressing or bathing?: No Does the patient have difficulty doing errands alone such as visiting a doctor's office or shopping?: No   Review of Systems Constitutional: -, -unexpected weight change, -anorexia, -fatigue Allergy: -sneezing, -itching,  -congestion Dermatology: denies changing moles, rash, lumps ENT: -runny nose, -ear pain, -sore throat,  Cardiology:  -chest pain, -palpitations, -orthopnea, Respiratory: -cough, -shortness of breath, -dyspnea on exertion, -wheezing,  Gastroenterology: -abdominal pain, -nausea, -vomiting, -diarrhea, -constipation, -dysphagia Hematology: -bleeding or bruising problems Musculoskeletal: -arthralgias, -myalgias, -joint swelling, -back pain, - Ophthalmology: -vision changes,  Urology: -dysuria, -difficulty urinating,  -urinary frequency, -urgency, incontinence Neurology: -, -numbness, , -memory loss, -falls, -dizziness    PHYSICAL EXAM:  BP 128/62   Pulse (!) 51   Temp (!) 97.5 F (36.4 C)   Ht $R'5\' 3"'Aj$  (1.6 m)   Wt 196 lb (88.9 kg)   LMP 04/25/1998 (Exact Date)   SpO2 98%   BMI 34.72 kg/m   General Appearance: Alert, cooperative, no distress, appears stated age Head: Normocephalic, without obvious abnormality, atraumatic Eyes: PERRL, conjunctiva/corneas clear, EOM's intact, Ears: Normal TM's and external ear canals Nose: Nares normal, mucosa normal, no drainage or sinus tenderness Throat: Lips, mucosa, and tongue normal; teeth and gums normal Neck: Supple, no lymphadenopathy;  thyroid:  no enlargement/tenderness/nodules; no carotid bruit or JVD Lungs: Clear to auscultation bilaterally without wheezes, rales or ronchi; respirations unlabored Heart: Regular rate and rhythm, S1 and S2 normal, no murmur, rubor gallop Abdomen: Soft, non-tender, nondistended, normoactive bowel sounds,  no masses, no hepatosplenomegaly Lymph nodes: Cervical, supraclavicular, and axillary nodes normal Neurologic:  CNII-XII intact, normal strength, sensation and gait; reflexes 2+ and symmetric throughout Psych: Normal mood, affect, hygiene and grooming.  ASSESSMENT/PLAN: Routine general medical examination at a health care facility - Plan: CBC with Differential/Platelet, Comprehensive metabolic panel, Lipid  panel  Essential hypertension - Plan: CBC with Differential/Platelet, Comprehensive metabolic panel, atenolol (TENORMIN) 50 MG tablet, hydrochlorothiazide (MICROZIDE) 12.5 MG capsule  PVC's (premature ventricular contractions) - Plan: Ambulatory referral to Cardiology  Allergic rhinitis due to pollen, unspecified seasonality  Mild persistent extrinsic asthma without complication - Plan: fluticasone-salmeterol (ADVAIR) 100-50 MCG/ACT AEPB  Sleep apnea, unspecified type  Gastroesophageal reflux disease without esophagitis  Arthritis  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  She rarely uses her Xanax.  Continue on asthma/allergy medications.  Discussed the hemoglobin A1c and the need for her to work further on diet and exercise with the instructions being given concerning both of them.  Recommend trying an H2 blocker for control of her symptoms but if continued difficulty, will refer to GI.  She is not interested in pursuing anything concerning the OSA other than potentially talking to her dentist about a dental appliance.  6   Discussed monthly self breast exams and yearly mammograms; at least 30 minutes of aerobic activity at least 5 days/week and  weight-bearing exercise 2x/week; proper sunscreen use reviewed; healthy diet, including goals of calcium and vitamin D intake and alcohol recommendations (less than or equal to 1 drink/day) reviewed; regular seatbelt use;  changing batteries in smoke detectors.  Immunization recommendations discussed.  Colonoscopy recommendations reviewed   Medicare Attestation I have personally reviewed: The patient's medical and social history Their use of alcohol, tobacco or illicit drugs Their current medications and supplements The patient's functional ability including ADLs,fall risks, home safety risks, cognitive, and hearing and visual impairment Diet and physical activities Evidence for depression or mood disorders  The patient's weight, height, and BMI have been recorded in the chart.  I have made referrals, counseling, and provided education to the patient based on review of the above and I have provided the patient with a written personalized care plan for preventive services.     Jill Alexanders, MD   01/06/2022

## 2022-01-07 LAB — CBC WITH DIFFERENTIAL/PLATELET
Basophils Absolute: 0.1 10*3/uL (ref 0.0–0.2)
Basos: 1 %
EOS (ABSOLUTE): 0.1 10*3/uL (ref 0.0–0.4)
Eos: 2 %
Hematocrit: 40.4 % (ref 34.0–46.6)
Hemoglobin: 13.5 g/dL (ref 11.1–15.9)
Immature Grans (Abs): 0 10*3/uL (ref 0.0–0.1)
Immature Granulocytes: 0 %
Lymphocytes Absolute: 1.7 10*3/uL (ref 0.7–3.1)
Lymphs: 26 %
MCH: 28.9 pg (ref 26.6–33.0)
MCHC: 33.4 g/dL (ref 31.5–35.7)
MCV: 87 fL (ref 79–97)
Monocytes Absolute: 0.6 10*3/uL (ref 0.1–0.9)
Monocytes: 8 %
Neutrophils Absolute: 4.3 10*3/uL (ref 1.4–7.0)
Neutrophils: 63 %
Platelets: 154 10*3/uL (ref 150–450)
RBC: 4.67 x10E6/uL (ref 3.77–5.28)
RDW: 13.1 % (ref 11.7–15.4)
WBC: 6.7 10*3/uL (ref 3.4–10.8)

## 2022-01-07 LAB — COMPREHENSIVE METABOLIC PANEL
ALT: 15 IU/L (ref 0–32)
AST: 16 IU/L (ref 0–40)
Albumin/Globulin Ratio: 1.9 (ref 1.2–2.2)
Albumin: 4.2 g/dL (ref 3.8–4.8)
Alkaline Phosphatase: 87 IU/L (ref 44–121)
BUN/Creatinine Ratio: 16 (ref 12–28)
BUN: 11 mg/dL (ref 8–27)
Bilirubin Total: 0.4 mg/dL (ref 0.0–1.2)
CO2: 25 mmol/L (ref 20–29)
Calcium: 9.1 mg/dL (ref 8.7–10.3)
Chloride: 102 mmol/L (ref 96–106)
Creatinine, Ser: 0.69 mg/dL (ref 0.57–1.00)
Globulin, Total: 2.2 g/dL (ref 1.5–4.5)
Glucose: 138 mg/dL — ABNORMAL HIGH (ref 70–99)
Potassium: 4 mmol/L (ref 3.5–5.2)
Sodium: 142 mmol/L (ref 134–144)
Total Protein: 6.4 g/dL (ref 6.0–8.5)
eGFR: 96 mL/min/{1.73_m2} (ref >59)

## 2022-01-07 LAB — LIPID PANEL
Chol/HDL Ratio: 2.9 ratio (ref 0.0–4.4)
Cholesterol, Total: 204 mg/dL — ABNORMAL HIGH (ref 100–199)
HDL: 71 mg/dL (ref >39)
LDL Chol Calc (NIH): 111 mg/dL — ABNORMAL HIGH (ref 0–99)
Triglycerides: 124 mg/dL (ref 0–149)
VLDL Cholesterol Cal: 22 mg/dL (ref 5–40)

## 2022-01-09 LAB — SPECIMEN STATUS REPORT

## 2022-01-09 LAB — HGB A1C W/O EAG: Hgb A1c MFr Bld: 6.5 % — ABNORMAL HIGH (ref 4.8–5.6)

## 2022-01-12 ENCOUNTER — Telehealth: Payer: Self-pay | Admitting: *Deleted

## 2022-01-12 NOTE — Telephone Encounter (Signed)
Patient called and states she has 99.3 fever and PDN w/coughing. Virtual visit offered. She is asking if you would call her in something instead. She has a virtual for tomorrow already for diabetes follow up. Please advise on how you would like to handle this, thanks.

## 2022-01-13 ENCOUNTER — Telehealth (INDEPENDENT_AMBULATORY_CARE_PROVIDER_SITE_OTHER): Payer: Medicare Other | Admitting: Family Medicine

## 2022-01-13 VITALS — Temp 99.3°F | Wt 196.0 lb

## 2022-01-13 DIAGNOSIS — J069 Acute upper respiratory infection, unspecified: Secondary | ICD-10-CM

## 2022-01-13 DIAGNOSIS — E119 Type 2 diabetes mellitus without complications: Secondary | ICD-10-CM | POA: Diagnosis not present

## 2022-01-13 NOTE — Progress Notes (Signed)
   Subjective:    Patient ID: Denise [name], female    DOB: 10/08/1955, 66 y.o.   MRN: [id number]  HPI Documentation for virtual audio and video telecommunications through [facility] encounter: The patient was located at home. 2 patient identifiers used.  The provider was located in the office. The patient did consent to this visit and is aware of possible charges through their insurance for this visit. The other persons participating in this telemedicine service were none. Time spent on call was 5 minutes and in review of previous records >14 minutes total for counseling and coordination of care. This virtual service is not related to other E/M service within previous 7 days.  She recently had blood work and a repeat hemoglobin A1c was 6.3 and then 6.5.  She states that she sometimes does use the treadmill but not on a regular basis.  She has not made any dietary changes.  She also complains of a 3-day history of fever just below 100 ,PND and coughing.  She has been using her inhaler and some Tylenol.  Review of Systems     Objective:   Physical Exam Alert and in no distress otherwise not examined       Assessment & Plan:  New onset type 2 diabetes mellitus ([place])  Viral URI with cough Discussed diet and exercise with her in regard to cutting back on carbohydrates.  Mentioned bread, rice, pasta, potatoes and sugar.  She needs to cut the dosing in half of what she is taking now.  Recommend physical activity appointments of something physical daily.  She is using a stationary bike. I then recommended symptomatic care for her URI and if continued over a week's period of time to call back.

## 2022-01-14 ENCOUNTER — Ambulatory Visit: Payer: Medicare Other | Admitting: Podiatry

## 2022-01-16 ENCOUNTER — Ambulatory Visit (INDEPENDENT_AMBULATORY_CARE_PROVIDER_SITE_OTHER): Payer: Medicare Other | Admitting: Podiatry

## 2022-01-16 DIAGNOSIS — M7752 Other enthesopathy of left foot: Secondary | ICD-10-CM

## 2022-01-16 DIAGNOSIS — M7751 Other enthesopathy of right foot: Secondary | ICD-10-CM | POA: Diagnosis not present

## 2022-01-20 ENCOUNTER — Other Ambulatory Visit: Payer: Self-pay | Admitting: Family Medicine

## 2022-01-20 ENCOUNTER — Encounter: Payer: Self-pay | Admitting: Podiatry

## 2022-01-20 DIAGNOSIS — J4530 Mild persistent asthma, uncomplicated: Secondary | ICD-10-CM

## 2022-01-20 NOTE — Progress Notes (Signed)
Subjective:  Patient ID: Denise [name], female    DOB: [birth year],  MRN: [id number]  Chief Complaint  Patient presents with   Injections    66 y.o. female presents with the above complaint.  Patient presents with complaint of bilateral second metatarsophalangeal joint pain.  She states that the injection helped.  She would like to do another injection.  The injection does not last longer.   Review of Systems: Negative except as noted in the HPI. Denies N/V/F/Ch.  Past Medical History:  Diagnosis Date   Allergy    Asthma    Bursitis of left hip    Fibroids    TAH in 1999   GERD (gastroesophageal reflux disease)    Lymphoma (HCC)    T cell lymphoma   PVC's (premature ventricular contractions)    Sleep apnea    does not use CPAP    Current Outpatient Medications:    albuterol (VENTOLIN HFA) 108 (90 Base) MCG/ACT inhaler, INHALE 2 PUFFS INTO THE LUNGS EVERY 6 HOURS AS NEEDED FOR WHEEZING OR SHORTNESS OF BREATH, Disp: 8.5 g, Rfl: 0   ALPRAZolam (XANAX) 0.25 MG tablet, TAKE 1 TABLET BY MOUTH TWICE DAILY AS NEEDED, Disp: 50 tablet, Rfl: 0   aspirin 81 MG chewable tablet, Chew 81 mg by mouth daily as needed for moderate pain., Disp: , Rfl:    atenolol (TENORMIN) 50 MG tablet, Take 1 tablet (50 mg total) by mouth 2 (two) times daily., Disp: 180 tablet, Rfl: 3   fluticasone (FLONASE) 50 MCG/ACT nasal spray, Place into the nose., Disp: , Rfl:    fluticasone-salmeterol (ADVAIR) 100-50 MCG/ACT AEPB, INHALE 1 PUFF INTO THE LUNGS TWICE DAILY, Disp: 60 each, Rfl: 5   hydrochlorothiazide (MICROZIDE) 12.5 MG capsule, TAKE 1 CAPSULE BY MOUTH DAILY, Disp: 90 capsule, Rfl: 0   ibuprofen (ADVIL) 800 MG tablet, TAKE 1 TABLET BY MOUTH EVERY 6 HOURS AS NEEDED, Disp: 60 tablet, Rfl: 1   ondansetron (ZOFRAN ODT) 4 MG disintegrating tablet, Take 1 tablet (4 mg total) by mouth every 8 (eight) hours as needed for nausea or vomiting., Disp: 12 tablet, Rfl: 0   Probiotic Product (PROBIOTIC DAILY PO),  Take 1 capsule by mouth daily., Disp: , Rfl:    triazolam (HALCION) 0.25 MG tablet, TAKE 1 TABLET BY MOUTH EACH NIGHT AT BEDTIME AS NEEDED FOR SLEEP, Disp: 90 tablet, Rfl: 0  Social History   Tobacco Use  Smoking Status Never  Smokeless Tobacco Never    Allergies  Allergen Reactions   Morphine And Related    Objective:  There were no vitals filed for this visit. There is no height or weight on file to calculate BMI. Constitutional Well developed. Well nourished.  Vascular Dorsalis pedis pulses palpable bilaterally. Posterior tibial pulses palpable bilaterally. Capillary refill normal to all digits.  No cyanosis or clubbing noted. Pedal hair growth normal.  Neurologic Normal speech. Oriented to person, place, and time. Epicritic sensation to light touch grossly present bilaterally.  Dermatologic Nails well groomed and normal in appearance. No open wounds. No skin lesions.  Orthopedic:  Clinically healing bilateral plantar fat pad atrophy noted to both forefoot.  Mild metatarsalgia noted clinically to both forefoot.  No pain with range of motion of the metatarsophalangeal joint bilaterally.  No deep intra-articular MTPJ joint pain noted to any of the joints bilaterally.  No extensor or flexor tendinitis noted.  Pain on palpation to the range of motion of bilateral second metatarsophalangeal joint.  No deep intra-articular pain noted.  No plantar plate involvement noted.  Negative Mulder's click noted.  No signs of neuroma noted.   Radiographs: 3 views of skeletally mature adult bilateral foot severe osteoarthritic changes noted of the first and second metatarsal phalangeal joint.  Medial deviation of the digits noted.  History of prior surgery.  Cavovarus foot structure noted bilaterally.  Severe pes planovalgus noted.  Midfoot arthritis noted.  No other bony abnormalities identified.  Assessment:   1. Capsulitis of metatarsophalangeal (MTP) joint of right foot   2. Capsulitis of  metatarsophalangeal (MTP) joint of left foot       Plan:  Patient was evaluated and treated and all questions answered.  Bilateral plantar fat pad atrophy with underlying metatarsalgia with underlying pes planovalgus -Clinically resolved with orthotics  Bilateral second metatarsophalangeal joint capsulitis~recurrence -Explained the patient etiology of capsulitis and various treatment options were extensively discussed.  Given the amount of pain that she is having I believe she will benefit from a steroid injection of decrease acute inflammatory component associated pain.  She states understanding like to proceed with a steroid injection. -A second steroid injection was performed at second metatarsophalangeal joint bilateral using 1% plain Lidocaine and 10 mg of Kenalog. This was well tolerated.   No follow-ups on file.

## 2022-01-20 NOTE — Telephone Encounter (Signed)
[facility] is requesting to fill pt albuterol . Please advise. Kh

## 2022-01-22 DIAGNOSIS — Z780 Asymptomatic menopausal state: Secondary | ICD-10-CM | POA: Diagnosis not present

## 2022-01-22 LAB — HM DEXA SCAN

## 2022-01-26 NOTE — Progress Notes (Unsigned)
Pt advised of normal bone density. [place]

## 2022-01-27 ENCOUNTER — Encounter: Payer: Self-pay | Admitting: Cardiovascular Disease

## 2022-01-27 ENCOUNTER — Ambulatory Visit (INDEPENDENT_AMBULATORY_CARE_PROVIDER_SITE_OTHER): Payer: Medicare Other | Admitting: Cardiovascular Disease

## 2022-01-27 ENCOUNTER — Encounter: Payer: Self-pay | Admitting: Family Medicine

## 2022-01-27 DIAGNOSIS — G4733 Obstructive sleep apnea (adult) (pediatric): Secondary | ICD-10-CM | POA: Diagnosis not present

## 2022-01-27 DIAGNOSIS — I493 Ventricular premature depolarization: Secondary | ICD-10-CM | POA: Diagnosis not present

## 2022-01-27 DIAGNOSIS — Z8249 Family history of ischemic heart disease and other diseases of the circulatory system: Secondary | ICD-10-CM | POA: Diagnosis not present

## 2022-01-27 NOTE — Assessment & Plan Note (Signed)
History of PVCs in the past controlled on beta-blockade.

## 2022-01-27 NOTE — Progress Notes (Signed)
01/27/2022 Denise [name]   12/06/1955  [id number]  Primary Physician Denita Lung, MD Primary Cardiologist: Lorretta Harp MD Denise [name], [place]  HPI:  Denise [name] is a 66 y.o. mild to moderately overweight single Caucasian female with no children who I last saw approxi-15 years ago in 2008 with regular cardiac catheterization which was normal.  This was done because of PVCs.  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  She was referred back by Dr. Redmond School to be reestablished because of family history.  She does have obstructive sleep apnea but does not wear her CPAP.  I did begin her on a beta-blocker back in 2008 because of her PVCs which markedly improved her symptoms.  She had lymphoma 5 years ago which she with chemotherapy and had a stem cell transplant at [facility] and she has been cancer free for last 5 years.  She is fairly active, uses her stationary bicycle and works outside in her property and garden.  She denies chest pain or shortness of breath.   Current Meds  Medication Sig   albuterol (VENTOLIN HFA) 108 (90 Base) MCG/ACT inhaler INHALE 2 PUFFS INTO THE LUNGS EVERY 6 HOURS AS NEEDED FOR WHEEZING OR SHORTNESS OF BREATH   ALPRAZolam (XANAX) 0.25 MG tablet TAKE 1 TABLET BY MOUTH TWICE DAILY AS NEEDED   aspirin 81 MG chewable tablet Chew 81 mg by mouth daily as needed for moderate pain.   atenolol (TENORMIN) 50 MG tablet Take 1 tablet (50 mg total) by mouth 2 (two) times daily.   fluticasone (FLONASE) 50 MCG/ACT nasal spray Place into the nose.   fluticasone-salmeterol (ADVAIR) 100-50 MCG/ACT AEPB INHALE 1 PUFF INTO THE LUNGS TWICE DAILY   hydrochlorothiazide (MICROZIDE) 12.5 MG capsule TAKE 1 CAPSULE BY MOUTH DAILY   ibuprofen (ADVIL) 800 MG tablet TAKE 1 TABLET BY MOUTH EVERY 6 HOURS AS NEEDED   ondansetron (ZOFRAN ODT) 4 MG disintegrating tablet Take 1 tablet (4 mg total)  by mouth every 8 (eight) hours as needed for nausea or vomiting.   Probiotic Product (PROBIOTIC DAILY PO) Take 1 capsule by mouth daily.   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[place]: [employer]  Tobacco Use   Smoking status: Never   Smokeless tobacco: Never  Vaping Use   Vaping Use: Never used  Substance and Sexual Activity   Alcohol use: No   Drug use: No   Sexual activity: Yes    Partners: Female    Birth control/protection: None  Other Topics Concern   Not on file  Social History Narrative   Not on file   Social Determinants of Health   Financial Resource Strain: Not on file  Food Insecurity: Not on file  Transportation Needs: Not on file  Physical Activity: Not on file  Stress: Not on file  Social Connections: Not on file  Intimate  Partner Violence: Not on file     Review of Systems: General: negative for chills, fever, night sweats or weight changes.  Cardiovascular: negative for chest pain, dyspnea on exertion, edema, orthopnea, palpitations, paroxysmal nocturnal dyspnea or shortness of breath Dermatological: negative for rash Respiratory: negative for cough or wheezing Urologic: negative for hematuria Abdominal: negative for nausea, vomiting, diarrhea, bright red blood per rectum, melena, or hematemesis Neurologic: negative for visual changes, syncope, or dizziness All other systems reviewed and are otherwise negative except as noted above.    Blood pressure 124/72, pulse (!) 53, height '5\' 3"'$  (1.6 m), weight 193 lb 9.6 oz (87.8 kg), last  menstrual period 04/25/1998, SpO2 97 %.  General appearance: alert and no distress Neck: no adenopathy, no carotid bruit, no JVD, supple, symmetrical, trachea midline, and thyroid not enlarged, symmetric, no tenderness/mass/nodules Lungs: clear to auscultation bilaterally Heart: regular rate and rhythm, S1, S2 normal, no murmur, click, rub or gallop Extremities: extremities normal, atraumatic, no cyanosis or edema Pulses: 2+ and symmetric Skin: Skin color, texture, turgor normal. No rashes or lesions Neurologic: Grossly normal  EKG sinus bradycardia with septal Q waves and nonspecific ST-T wave changes.  Personally reviewed this EKG.  ASSESSMENT AND PLAN:   Sleep apnea History of obstructive sleep apnea although she does not use her CPAP.  PVC's (premature ventricular contractions) History of PVCs in the past controlled on beta-blockade.  Family history of heart disease in female family member before age 18 Father had cardiac catheterization and MI at an early age     Lorretta Harp MD [facility] 01/27/2022 1:22 PM

## 2022-01-27 NOTE — Assessment & Plan Note (Signed)
History of obstructive sleep apnea although she does not use her CPAP.

## 2022-01-27 NOTE — Patient Instructions (Signed)
Medication Instructions:  Your physician recommends that you continue on your current medications as directed. Please refer to the Current Medication list given to you today.  *If you need a refill on your cardiac medications before your next appointment, please call your pharmacy*    Testing/Procedures: Your physician has requested that you have an echocardiogram. Echocardiography is a painless test that uses sound waves to create images of your heart. It provides your doctor with information about the size and shape of your heart and how well your heart's chambers and valves are working. This procedure takes approximately one hour. There are no restrictions for this procedure. [facility] [place] [address]     Dr. Gwenlyn Found has ordered a CT coronary calcium score.   Test location:  [facility] [place],  [address])   This is $99 out of pocket.   Coronary CalciumScan A coronary calcium scan is an imaging test used to look for deposits of calcium and other fatty materials (plaques) in the inner lining of the blood vessels of the heart (coronary arteries). These deposits of calcium and plaques can partly clog and narrow the coronary arteries without producing any symptoms or warning signs. This puts a person at risk for a heart attack. This test can detect these deposits before symptoms develop. Tell a health care provider about: Any allergies you have. All medicines you are taking, including vitamins, herbs, eye drops, creams, and over-the-counter medicines. Any problems you or family members have had with anesthetic medicines. Any blood disorders you have. Any surgeries you have had. Any medical conditions you have. Whether you are pregnant or may be pregnant. What are the risks? Generally, this is a safe procedure. However, problems may occur, including: Harm to a pregnant woman and her  unborn baby. This test involves the use of radiation. Radiation exposure can be dangerous to a pregnant woman and her unborn baby. If you are pregnant, you generally should not have this procedure done. Slight increase in the risk of cancer. This is because of the radiation involved in the test. What happens before the procedure? No preparation is needed for this procedure. What happens during the procedure? You will undress and remove any jewelry around your neck or chest. You will put on a hospital gown. Sticky electrodes will be placed on your chest. The electrodes will be connected to an electrocardiogram (ECG) machine to record a tracing of the electrical activity of your heart. A CT scanner will take pictures of your heart. During this time, you will be asked to lie still and hold your breath for 2-3 seconds while a picture of your heart is being taken. The procedure may vary among health care providers and hospitals. What happens after the procedure? You can get dressed. You can return to your normal activities. It is up to you to get the results of your test. Ask your health care provider, or the department that is doing the test, when your results will be ready. Summary A coronary calcium scan is an imaging test used to look for deposits of calcium and other fatty materials (plaques) in the inner lining of the blood vessels of the heart (coronary arteries). Generally, this is a safe procedure. Tell your health care provider if you are pregnant or may be pregnant. No preparation is needed for this procedure. A CT scanner will take pictures of your heart. You can return to your normal activities after  the scan is done. This information is not intended to replace advice given to you by your health care provider. Make sure you discuss any questions you have with your health care provider. Document Released: 01/23/2008 Document Revised: 06/15/2016 Document Reviewed: 06/15/2016 Elsevier  Interactive Patient Education  2017 [place]: At [facility], you and your health needs are our priority.  As part of our continuing mission to provide you with exceptional heart care, we have created designated Provider Care Teams.  These Care Teams include your primary Cardiologist (physician) and Advanced Practice Providers (APPs -  Physician Assistants and Nurse Practitioners) who all work together to provide you with the care you need, when you need it.  We recommend signing up for the patient portal called "MyChart".  Sign up information is provided on this After Visit Summary.  MyChart is used to connect with patients for Virtual Visits (Telemedicine).  Patients are able to view lab/test results, encounter notes, upcoming appointments, etc.  Non-urgent messages can be sent to your provider as well.   To learn more about what you can do with MyChart, go to [email or web address].    Your next appointment:   We will see you on an as needed basis.  Provider:   Quay Burow, MD

## 2022-01-27 NOTE — Assessment & Plan Note (Signed)
Father had cardiac catheterization and MI at an early age

## 2022-01-28 DIAGNOSIS — Z1211 Encounter for screening for malignant neoplasm of colon: Secondary | ICD-10-CM | POA: Diagnosis not present

## 2022-01-29 ENCOUNTER — Telehealth: Payer: Self-pay | Admitting: Hematology

## 2022-01-29 NOTE — Telephone Encounter (Signed)
Rescheduled upcoming appointment due to provider's schedule. Patient is aware of changes. 

## 2022-02-05 LAB — COLOGUARD: COLOGUARD: NEGATIVE

## 2022-02-11 ENCOUNTER — Ambulatory Visit (INDEPENDENT_AMBULATORY_CARE_PROVIDER_SITE_OTHER): Payer: Medicare Other

## 2022-02-11 ENCOUNTER — Encounter (HOSPITAL_BASED_OUTPATIENT_CLINIC_OR_DEPARTMENT_OTHER): Payer: Self-pay

## 2022-02-11 ENCOUNTER — Ambulatory Visit (HOSPITAL_BASED_OUTPATIENT_CLINIC_OR_DEPARTMENT_OTHER)
Admission: RE | Admit: 2022-02-11 | Discharge: 2022-02-11 | Disposition: A | Discharge status: Home / Self Care | Payer: Medicare Other | Source: Ambulatory Visit | Attending: Cardiovascular Disease | Admitting: Cardiovascular Disease

## 2022-02-11 DIAGNOSIS — Z8249 Family history of ischemic heart disease and other diseases of the circulatory system: Secondary | ICD-10-CM | POA: Insufficient documentation

## 2022-02-11 DIAGNOSIS — I493 Ventricular premature depolarization: Secondary | ICD-10-CM

## 2022-02-11 DIAGNOSIS — G4733 Obstructive sleep apnea (adult) (pediatric): Secondary | ICD-10-CM

## 2022-02-11 DIAGNOSIS — I10 Essential (primary) hypertension: Secondary | ICD-10-CM | POA: Diagnosis not present

## 2022-02-11 LAB — ECHOCARDIOGRAM COMPLETE
AR max vel: 2.5 cm2
AV Area VTI: 2.16 cm2
AV Area mean vel: 2.16 cm2
AV Mean grad: 5 mmHg
AV Peak grad: 9.1 mmHg
Ao pk vel: 1.51 m/s
Area-P 1/2: 3.1 cm2
Calc EF: 69.3 %
S' Lateral: 3.14 cm
Single Plane A2C EF: 60.7 %
Single Plane A4C EF: 73.4 %

## 2022-02-12 ENCOUNTER — Telehealth: Payer: Self-pay

## 2022-02-12 DIAGNOSIS — Z8249 Family history of ischemic heart disease and other diseases of the circulatory system: Secondary | ICD-10-CM

## 2022-02-12 MED ORDER — ATORVASTATIN CALCIUM 20 MG PO TABS
20.0000 mg | ORAL_TABLET | Freq: Every day | ORAL | 3 refills | Status: DC
Start: 2022-02-12 — End: 2023-02-15

## 2022-02-12 NOTE — Telephone Encounter (Signed)
-----   Message from Lorretta Harp, MD sent at 02/11/2022  5:41 PM EDT ----- LDL 111 on recent FLP. CCS 140. Not at goal for secondary prevention. Start Atorva 20 mg and re check FLP 3 months. LDL goal <70

## 2022-02-12 NOTE — Telephone Encounter (Signed)
Called Denise [name] back concerning questions about a bump she had on her vignal area. Denise [name] then advised it had cleared up with warm compress and neosporin. Denise [name] was told to call back if this returns and we can schedule with Dr. Tomi Bamberger. [place]

## 2022-02-12 NOTE — Telephone Encounter (Signed)
Spoke with Denise [name] regarding recent coronary calcium score and Dr. Kennon Holter recommendations. Based on calcium score of 140 and recent LDL cholesterol levels 111, Dr. Gwenlyn Found would like to start atorvastatin '20mg'$  once daily. Prescription sent to Denise [name]'s pharmacy of choice. We will recheck labs in 3 months with an LDL goal less than 70. Lab orders placed and mailed to Denise [name]'s home address. All questions answered and Denise [name] verbalizes understanding.

## 2022-03-09 ENCOUNTER — Other Ambulatory Visit: Payer: Self-pay | Admitting: Medical

## 2022-03-09 ENCOUNTER — Other Ambulatory Visit: Payer: Self-pay

## 2022-03-09 ENCOUNTER — Other Ambulatory Visit: Payer: Self-pay | Admitting: Family Medicine

## 2022-03-09 DIAGNOSIS — Z638 Other specified problems related to primary support group: Secondary | ICD-10-CM

## 2022-03-09 DIAGNOSIS — C8478 Anaplastic large cell lymphoma, ALK-negative, lymph nodes of multiple sites: Secondary | ICD-10-CM

## 2022-03-09 MED ORDER — ALPRAZOLAM 0.25 MG PO TABS: 0.2500 mg | ORAL_TABLET | Freq: Two times a day (BID) | ORAL | 0 refills | Status: DC | PRN

## 2022-03-11 ENCOUNTER — Ambulatory Visit: Payer: BC Managed Care – PPO | Admitting: Hematology

## 2022-03-11 ENCOUNTER — Inpatient Hospital Stay (HOSPITAL_BASED_OUTPATIENT_CLINIC_OR_DEPARTMENT_OTHER): Payer: Medicare Other | Admitting: Hematology

## 2022-03-11 ENCOUNTER — Other Ambulatory Visit: Payer: BC Managed Care – PPO

## 2022-03-11 ENCOUNTER — Telehealth: Payer: Self-pay | Admitting: Cardiovascular Disease

## 2022-03-11 ENCOUNTER — Inpatient Hospital Stay: Payer: Medicare Other

## 2022-03-11 ENCOUNTER — Other Ambulatory Visit: Payer: Self-pay

## 2022-03-11 ENCOUNTER — Inpatient Hospital Stay: Payer: Medicare Other | Attending: Hematology

## 2022-03-11 VITALS — BP 155/67 | HR 51 | Temp 97.2°F | Resp 18 | Wt 195.6 lb

## 2022-03-11 DIAGNOSIS — C8478 Anaplastic large cell lymphoma, ALK-negative, lymph nodes of multiple sites: Secondary | ICD-10-CM | POA: Diagnosis not present

## 2022-03-11 DIAGNOSIS — Z8572 Personal history of non-Hodgkin lymphomas: Secondary | ICD-10-CM | POA: Diagnosis not present

## 2022-03-11 LAB — CMP (CANCER CENTER ONLY)
ALT: 18 U/L (ref 0–44)
AST: 18 U/L (ref 15–41)
Albumin: 4 g/dL (ref 3.5–5.0)
Alkaline Phosphatase: 76 U/L (ref 38–126)
Anion gap: 5 (ref 5–15)
BUN: 12 mg/dL (ref 8–23)
CO2: 31 mmol/L (ref 22–32)
Calcium: 8.7 mg/dL — ABNORMAL LOW (ref 8.9–10.3)
Chloride: 103 mmol/L (ref 98–111)
Creatinine: 0.73 mg/dL (ref 0.44–1.00)
GFR, Estimated: >60 mL/min (ref >60)
Glucose, Bld: 132 mg/dL — ABNORMAL HIGH (ref 70–99)
Potassium: 4 mmol/L (ref 3.5–5.1)
Sodium: 139 mmol/L (ref 135–145)
Total Bilirubin: 0.5 mg/dL (ref 0.3–1.2)
Total Protein: 6.6 g/dL (ref 6.5–8.1)

## 2022-03-11 LAB — CBC WITH DIFFERENTIAL (CANCER CENTER ONLY)
Abs Immature Granulocytes: 0.02 10*3/uL (ref 0.00–0.07)
Basophils Absolute: 0 10*3/uL (ref 0.0–0.1)
Basophils Relative: 1 %
Eosinophils Absolute: 0.1 10*3/uL (ref 0.0–0.5)
Eosinophils Relative: 2 %
HCT: 38.3 % (ref 36.0–46.0)
Hemoglobin: 13.2 g/dL (ref 12.0–15.0)
Immature Granulocytes: 0 %
Lymphocytes Relative: 23 %
Lymphs Abs: 1.5 10*3/uL (ref 0.7–4.0)
MCH: 29.9 pg (ref 26.0–34.0)
MCHC: 34.5 g/dL (ref 30.0–36.0)
MCV: 86.7 fL (ref 80.0–100.0)
Monocytes Absolute: 0.5 10*3/uL (ref 0.1–1.0)
Monocytes Relative: 8 %
Neutro Abs: 4.4 10*3/uL (ref 1.7–7.7)
Neutrophils Relative %: 66 %
Platelet Count: 133 10*3/uL — ABNORMAL LOW (ref 150–400)
RBC: 4.42 MIL/uL (ref 3.87–5.11)
RDW: 13.5 % (ref 11.5–15.5)
WBC Count: 6.7 10*3/uL (ref 4.0–10.5)
nRBC: 0 % (ref 0.0–0.2)

## 2022-03-11 LAB — LACTATE DEHYDROGENASE: LDH: 192 U/L (ref 98–192)

## 2022-03-11 NOTE — Telephone Encounter (Signed)
Patient asked if there is a weight loss medication she can take that will not interfere with her Lipitor or other meds. She is considering the drug Qsymia. Please advise.

## 2022-03-11 NOTE — Telephone Encounter (Signed)
Pt c/o medication issue:  1. Name of Medication: atorvastatin (LIPITOR) 20 MG tablet  2. How are you currently taking this medication (dosage and times per day)? 1 tablet daily  3. Are you having a reaction (difficulty breathing--STAT)? no  4. What is your medication issue? Patient states she would like a weight loss med that won't hurt her heart or interferer with her statin.

## 2022-03-13 ENCOUNTER — Telehealth: Payer: Self-pay | Admitting: Hematology

## 2022-03-13 NOTE — Telephone Encounter (Signed)
Scheduled follow-up appointment per 8/2 los. Patient is aware. 

## 2022-03-15 NOTE — Telephone Encounter (Signed)
I would NOT recommend Qsymia fo her.  In reviewing potential side effects, the medication has been shown to increase heart rate, with 15 % of people of the lowest dose seeing an increase of 20 or more bpm.  Up to almost 20% in the highest dose.   It can also cause hypokalemia, and while her last K was at 4.0, she is on a non-potassium sparing diuretic (HCTZ).  The manufacturer also notes an increased risk of mood/sleep disorders.  She has both alprazolam and triazolam listed in her med profile.    Her most recent A1c was 6.5, indicating a diagnosis of DM2.  Because of this she would qualify to take Ozempic, which (though not without some issues) would be safer based on her medication profile.  She should discuss it with whichever MD suggested Qsymia

## 2022-03-16 ENCOUNTER — Telehealth: Payer: Self-pay

## 2022-03-16 NOTE — Telephone Encounter (Signed)
Pt. Called stating she has been having vertigo and wanted to know if you had any recommendations OTC.

## 2022-03-17 NOTE — Progress Notes (Signed)
HEMATOLOGY/ONCOLOGY CLINIC NOTE  Date of Service: 03/11/2022    Patient Care Team: Denita Lung, MD as PCP - General (Family Medicine) Dr. 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 Valtrex resolved the shingles but she continued to have progressive adenopathy, cough and an unintentional weight loss. On 10/15/2016, a CT scan showed bulky adenopathy in the supraclavicular, mediastinum, hilar and abdomen. On 10/21/16, an ultrasound guided core biopsy of the left supraclavicular lymph node showed a T cell lymphoproliferative process with the differential diagnosis most suggestive of a CD30 positive PTCL, NOS or ALK negative anaplastic lymphoma. A PET scan on 10/27/16, redemonstrated bulky hypermetabolic lymphadenopathy throughout the body and a hypermetabolic mass in the central [name] with multiple nodules and bilateral pleural effusions. The highest SUV was in the left para-aortic region at 21.4.    In early April 2018 , she started chemotherapy with CHOEP. Interim scans showed nearly completely resolved adenopathy, [place] 1. Completed 5 cycles with CR1 prior to transplant.  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 Hydration and BCNU 300 mg/m2 in 500 ml D5W over 2 hours today. 08/30 - Day -5.  Cytarabine 200 mg/m2 in 250 ml NS over 1 hour and etoposide 200 mg/m2 in 1 L NS over 3 hours. 08/31 - Day -4. Cytarabine 200 mg/m2 in 250 ml NS over 1 hour and etoposide 200 mg/m2 in 1 L NS over 3 hours. 09/01 - Day -3. Cytarabine 200 mg/m2 in 250 ml NS over 1 hour and etoposide 200 mg/m2 in 1 L NS over 3 hours. 09/02 - Day -2. Cytarabine 200 mg/m2 in 250 ml NS over 1 hour and etoposide 200 mg/m2 in 1 L NS over 3 hours. 09/03 - Day -1. Melphalan 140 mg/m2 in 100 ml NS over 30 minutes.    04/13/2017 Transplant  Outpatient Autologous stem cell transplant - Day 0 on 04/13/2017  ASBMT risk stratification: low risk ECOG/KPS: 1/Able to carry on normal activities; minor signs/symptoms of disease HSCT-CI score: CR1 Preparative regimen/treatment protocol: BEAM  Engraftment:  WBC on 04/23/2017 with WBC 1.8 and ANC 1.1 Platelets: Anticipated for 9/21 - last platelet transfusion on 1/44/8185   01/11/1496 Complications  Transplant complications: Cx negative neutropenic fever - admitted to inpatient service and started on broad spectrum Zosyn which she continued until WBC engraftment on 04/23/2017  Mucositis: Grade II - managed with MMW    INTERVAL HISTORY  Ms [name] is here for her scheduled follow-up for her 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  Last clinic visit with [name] was about 1 year ago and she was seen by her transplant team about 6 months ago.  She is accompanied by her partner.  She notes no acute new symptoms since her last clinic visit.  No new lumps or bumps.  No fevers no chills no night sweats no  unexpected weight loss. Good p.o. intake and has been staying active.  Labs done today were reviewed with her in details. MEDICAL HISTORY:  Past Medical History:  Diagnosis Date   Allergy    Asthma    Bursitis of left hip    Fibroids    TAH in 1999   GERD (gastroesophageal reflux disease)    Lymphoma ([place])    T cell lymphoma   PVC's (premature ventricular contractions)     Sleep apnea    does not use CPAP    SURGICAL HISTORY: Past Surgical History:  Procedure Laterality Date   CARDIAC CATH  05/2007   J. BERRY   HAMMER TOE SURGERY Bilateral 2016   IR GENERIC HISTORICAL  11/05/2016   IR US GUIDE VASC ACCESS RIGHT 11/05/2016 [name], MD [facility]   IR GENERIC HISTORICAL  11/05/2016   IR FLUORO GUIDE PORT INSERTION RIGHT 11/05/2016 [name], MD [facility]   TOTAL ABDOMINAL HYSTERECTOMY  05/15/1998   secondary to fibroids   WISDOM TOOTH EXTRACTION  mid teens    SOCIAL HISTORY: Social History   Socioeconomic History   Marital status: Significant Other    Spouse name: Not on file   Number of children: 0   Years of education: Not on file   Highest education level: Not on file  Occupational History   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[place]: [employer]  Tobacco Use   Smoking status: Never   Smokeless tobacco: Never  Vaping Use   Vaping Use: Never used  Substance and Sexual Activity   Alcohol use: No   Drug use: No   Sexual activity: Yes    Partners: Female    Birth control/protection: None  Other Topics Concern   Not on file  Social History Narrative   Not on file   Social Determinants of Health   Financial Resource Strain: Not on file  Food Insecurity: Not on file  Transportation Needs: Not on file  Physical Activity: Not on file  Stress: Not on file  Social Connections: Not on file  Intimate Partner Violence: Not on file    FAMILY HISTORY: Family History  Problem Relation Age of Onset   Cancer Maternal Grandmother        GI cancer in 27's   Dementia Mother    Heart disease Father    Heart failure Father     ALLERGIES:  is allergic to morphine and related.  MEDICATIONS:  Current Outpatient Medications  Medication Sig Dispense Refill   albuterol (VENTOLIN HFA) 108 (90 Base) MCG/ACT inhaler INHALE 2 PUFFS INTO THE LUNGS EVERY 6 HOURS AS NEEDED FOR WHEEZING OR SHORTNESS OF  BREATH 8.5 g 0   ALPRAZolam (XANAX) 0.25 MG tablet TAKE 1 TABLET BY MOUTH TWICE DAILY AS NEEDED 50 tablet 0   ALPRAZolam (XANAX) 0.25 MG tablet Take 1 tablet (0.25 mg total) by mouth 2 (two) times daily as needed. 50 tablet 0   aspirin 81 MG chewable tablet Chew 81 mg by mouth daily as needed for moderate pain.     atenolol (TENORMIN) 50 MG tablet Take 1 tablet (50 mg total) by mouth 2 (two) times daily. 180 tablet 3   atorvastatin (LIPITOR) 20 MG tablet Take 1 tablet (20 mg total) by mouth daily. 90 tablet 3   fluticasone (FLONASE) 50 MCG/ACT nasal spray Place into the nose.     fluticasone-salmeterol (ADVAIR) 100-50 MCG/ACT AEPB INHALE 1 PUFF INTO THE LUNGS TWICE DAILY  60 each 5   hydrochlorothiazide (MICROZIDE) 12.5 MG capsule TAKE 1 CAPSULE BY MOUTH DAILY 90 capsule 0   ibuprofen (ADVIL) 800 MG tablet TAKE 1 TABLET BY MOUTH EVERY 6 HOURS AS NEEDED 60 tablet 1   ondansetron (ZOFRAN ODT) 4 MG disintegrating tablet Take 1 tablet (4 mg total) by mouth every 8 (eight) hours as needed for nausea or vomiting. 12 tablet 0   Probiotic Product (PROBIOTIC DAILY PO) Take 1 capsule by mouth daily.     triazolam (HALCION) 0.25 MG tablet TAKE 1 TABLET BY MOUTH EACH NIGHT AT BEDTIME AS NEEDED FOR SLEEP 90 tablet 0   No current facility-administered medications for this visit.    REVIEW OF SYSTEMS:   10 Point review of Systems was done is negative except as noted above.  PHYSICAL EXAMINATION:  ECOG FS:0 - Asymptomatic  Vitals:   03/11/22 0925  BP: (!) 155/67  Pulse: (!) 51  Resp: 18  Temp: (!) 97.2 F (36.2 C)  SpO2: 100%    Wt Readings from Last 3 Encounters:  03/11/22 195 lb 9.6 oz (88.7 kg)  01/27/22 193 lb 9.6 oz (87.8 kg)  01/13/22 196 lb (88.9 kg)   Body mass index is 34.65 kg/m.    NAD GENERAL:alert, in no acute distress and comfortable SKIN: no acute rashes, no significant lesions EYES: conjunctiva are pink and non-injected, sclera anicteric OROPHARYNX: MMM, no exudates, no  oropharyngeal erythema or ulceration NECK: supple, no JVD LYMPH:  no palpable lymphadenopathy in the cervical, axillary or inguinal regions LUNGS: clear to auscultation b/l with normal respiratory effort HEART: regular rate & rhythm ABDOMEN:  normoactive bowel sounds , non tender, not distended. Extremity: no pedal edema PSYCH: alert & oriented x 3 with fluent speech NEURO: no focal motor/sensory deficits   LABORATORY DATA:  I have reviewed the data as listed  .    Latest Ref Rng & Units 03/11/2022    8:57 AM 01/06/2022    9:37 AM 03/11/2021    2:26 PM  CBC  WBC 4.0 - 10.5 K/uL 6.7  6.7  8.4   Hemoglobin 12.0 - 15.0 g/dL 13.2  13.5  13.4   Hematocrit 36.0 - 46.0 % 38.3  40.4  38.9   Platelets 150 - 400 K/uL 133  154  145    .    Latest Ref Rng & Units 03/11/2022    8:57 AM 01/06/2022    9:37 AM 03/11/2021    2:26 PM  CMP  Glucose 70 - 99 mg/dL 132  138  98   BUN 8 - 23 mg/dL _0 Creatinine 0.44 - 1.00 mg/dL 0.73  0.69  1.02   Sodium 135 - 145 mmol/L 139  142  139   Potassium 3.5 - 5.1 mmol/L 4.0  4.0  3.8   Chloride 98 - 111 mmol/L 103  102  102   CO2 22 - 32 mmol/L _1 Calcium 8.9 - 10.3 mg/dL 8.7  9.1  9.2   Total Protein 6.5 - 8.1 g/dL 6.6  6.4  6.7   Total Bilirubin 0.3 - 1.2 mg/dL 0.5  0.4  0.3   Alkaline Phos 38 - 126 U/L 76  87  81   AST 15 - 41 U/L _2 ALT 0 - 44 U/L _3 . Lab Results  Component Value Date   LDH 192 03/11/2022  RADIOGRAPHIC STUDIES: I have personally reviewed the radiological images as listed and agreed with the findings in the report. No results found.   PET Lymphoma Hodgkins (with/ low dose CT) , 07/22/17 IMPRESSION: 1. Mildly hypermetabolic bilateral pulmonary airspace opacities that are favored to represent infection versus complications of stem cell transplant. Follow-up with diagnostic CT may be helpful to distinguish. 2. No FDG avid malignant disease at present. Ancillary CT findings as  above.  04/14/18 CT C/A/P and NECK:        10/13/18 CT C/A/P and Neck:     04/13/2019   04/13/2019   ASSESSMENT & PLAN:   66 y.o. 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   PET/CT scan shows bulky hypermetabolic lymphadenopathy throughout the neck, chest, and abdomen. 1 cm hypermetabolic pelvic lymph node in right external iliac chain. Hypermetabolic masslike opacity in central right [name] with multiple adjacent hypermetabolic nodules. This is suspicious for pulmonary involvement by lymphoma  Fatigue anorexia or night sweats-drenching, and weight loss suggest constitutional type B symptoms.  Hepatitis profile and HIV neg  Patient is s/p 5 cycles of CHOEP tolerated well overall and received CHOP for 6th cycle due to significant cytopenias after 5th cycle of CHOEP. PET/CT on 01/08/2017 - showed near completely resolved LNadenopathy.  She received her autologous stem cell transplant with Dr Duanne Moron at [facility] on 04/13/2017 which she has tolerated well and has recovered well with resolved cytopenias.  -PET scan on 07/22/2017 with results of: No FDG avid malignant disease at present.  04/14/18 CT C/A/P and Neck revealed No evidence of disease return of progression    #2 history of PVCs -On chronic beta blocker therapy.  #3 history of sleep apnea -continue CPAP use   PLAN: -Patient has no signs or symptoms of progression/recurrence of her anaplastic T-cell lymphoma. -Labs done today were reviewed with her in detail.  CBC stable with mild thrombocytopenia CMP stable LDH within normal limits. Patient has no lab or clinical evidence of lymphoma recurrence at this time She is now about nearly 5 years from her transplant and has been and continues to be in remission.  FOLLOW UP: RTC with Dr Irene Limbo with labs in 12 months   The total time spent in the appointment was 21  minutes*.  All of the patient's questions were answered with apparent satisfaction. The patient knows to call the clinic with any problems, questions or concerns.   Sullivan Lone MD MS AAHIVMS [facility] Hematology/Oncology Physician [facility]  .*Total Encounter Time as defined by the Centers for Medicare and Medicaid Services includes, in addition to the face-to-face time of a patient visit (documented in the note above) non-face-to-face time: obtaining and reviewing outside history, ordering and reviewing medications, tests or procedures, care coordination (communications with other health care professionals or caregivers) and documentation in the medical record.

## 2022-04-06 ENCOUNTER — Other Ambulatory Visit: Payer: Self-pay | Admitting: Medical

## 2022-04-06 DIAGNOSIS — Z638 Other specified problems related to primary support group: Secondary | ICD-10-CM

## 2022-04-06 NOTE — Telephone Encounter (Signed)
Refill on xanax last apt 01/13/22 next apt 05/25/22.

## 2022-04-10 LAB — HM MAMMOGRAPHY

## 2022-04-15 ENCOUNTER — Encounter: Payer: Self-pay | Admitting: Internal Medicine

## 2022-04-16 DIAGNOSIS — Z1231 Encounter for screening mammogram for malignant neoplasm of breast: Secondary | ICD-10-CM | POA: Diagnosis not present

## 2022-04-16 LAB — HM MAMMOGRAPHY

## 2022-04-21 DIAGNOSIS — R922 Inconclusive mammogram: Secondary | ICD-10-CM | POA: Diagnosis not present

## 2022-04-22 ENCOUNTER — Encounter: Payer: Self-pay | Admitting: Family Medicine

## 2022-04-30 ENCOUNTER — Other Ambulatory Visit: Payer: Self-pay | Admitting: Family Medicine

## 2022-04-30 DIAGNOSIS — I10 Essential (primary) hypertension: Secondary | ICD-10-CM

## 2022-05-13 ENCOUNTER — Telehealth: Payer: Self-pay | Admitting: Family Medicine

## 2022-05-13 ENCOUNTER — Telehealth (INDEPENDENT_AMBULATORY_CARE_PROVIDER_SITE_OTHER): Payer: Medicare Other | Admitting: Family Medicine

## 2022-05-13 ENCOUNTER — Encounter: Payer: Self-pay | Admitting: Family Medicine

## 2022-05-13 VITALS — Temp 98.1°F | Wt 191.0 lb

## 2022-05-13 DIAGNOSIS — U071 COVID-19: Secondary | ICD-10-CM | POA: Diagnosis not present

## 2022-05-13 DIAGNOSIS — E119 Type 2 diabetes mellitus without complications: Secondary | ICD-10-CM | POA: Diagnosis not present

## 2022-05-13 DIAGNOSIS — Z9484 Stem cells transplant status: Secondary | ICD-10-CM

## 2022-05-13 MED ORDER — NIRMATRELVIR/RITONAVIR (PAXLOVID)TABLET: 3.0000 | ORAL_TABLET | Freq: Two times a day (BID) | ORAL | 0 refills | Status: AC

## 2022-05-13 MED ORDER — NIRMATRELVIR/RITONAVIR (PAXLOVID)TABLET: 3.0000 | ORAL_TABLET | Freq: Two times a day (BID) | ORAL | 0 refills | Status: DC

## 2022-05-13 NOTE — Progress Notes (Signed)
   Subjective:    Patient ID: Denise [name], female    DOB: 09-29-1955, 66 y.o.   MRN: [id number]  HPI Documentation for virtual audio and video telecommunications through [place] encounter: The patient was located at home. 2 patient identifiers used.  The provider was located in the office. The patient did consent to this visit and is aware of possible charges through their insurance for this visit. The other persons participating in this telemedicine service were none. Time spent on call was 5 minutes and in review of previous records >20 minutes total for counseling and coordination of care. This virtual service is not related to other E/M service within previous 7 days.  She states that on Monday she developed an earache followed the next day with PND, slight cough, chest tightness.  She did use albuterol yesterday with good results.  She tested herself this morning and the COVID test was positive.  She presently does not have fever or shortness of breath but does have a slight cough.  She has an underlying history of diabetes and also previous history of lymphoma.  Review of Systems     Objective:   Physical Exam Alert and in no distress otherwise not examined       Assessment & Plan:  COVID-19 - Plan: nirmatrelvir/ritonavir EUA (PAXLOVID) 20 x 150 MG & 10 x '100MG'$  TABS  New onset type 2 diabetes mellitus ([place])  H/O autologous stem cell transplant ([place]) I discussed all these variables with her and think placing her on Paxil but it is very reasonable.  Cautioned on the use of sedating medications as well as her inhaler.  She is to treat her symptoms however if she gets worsening fever shortness of breath and cough she is to call me and we will probably send her to the hospital.  She has through Saturday to stay sequestered and then after that if she is feeling better she can go out in public with a mask for the next 5 days.  Suggest that her partner if asymptomatic check for  COVID in several days.

## 2022-05-13 NOTE — Telephone Encounter (Signed)
Pharmacy called and stated they do not carry Paxlovid at all so I called Karima and she wants it switched to [place] just for the paxlovid only.

## 2022-05-19 ENCOUNTER — Encounter: Payer: Self-pay | Admitting: Internal Medicine

## 2022-05-25 ENCOUNTER — Encounter: Payer: Self-pay | Admitting: Family Medicine

## 2022-05-25 ENCOUNTER — Ambulatory Visit (INDEPENDENT_AMBULATORY_CARE_PROVIDER_SITE_OTHER): Payer: Medicare Other | Admitting: Family Medicine

## 2022-05-25 VITALS — BP 128/78 | HR 56 | Temp 98.3°F | Wt 191.6 lb

## 2022-05-25 DIAGNOSIS — Z8572 Personal history of non-Hodgkin lymphomas: Secondary | ICD-10-CM

## 2022-05-25 DIAGNOSIS — E119 Type 2 diabetes mellitus without complications: Secondary | ICD-10-CM

## 2022-05-25 DIAGNOSIS — E7439 Other disorders of intestinal carbohydrate absorption: Secondary | ICD-10-CM | POA: Diagnosis not present

## 2022-05-25 DIAGNOSIS — G4733 Obstructive sleep apnea (adult) (pediatric): Secondary | ICD-10-CM

## 2022-05-25 DIAGNOSIS — F419 Anxiety disorder, unspecified: Secondary | ICD-10-CM | POA: Diagnosis not present

## 2022-05-25 LAB — POCT GLYCOSYLATED HEMOGLOBIN (HGB A1C): Hemoglobin A1C: 6.3 % — AB (ref 4.0–5.6)

## 2022-05-25 NOTE — Progress Notes (Signed)
  Subjective:    Patient ID: Denise [name], female    DOB: 12-Jun-1956, 66 y.o.   MRN: [id number]  Denise [name] is a 66 y.o. female who presents for follow-up of Type 2 diabetes mellitus.  Patient is not checking home blood sugars.   Home blood sugar records:  n/a How often is blood sugars being checked: n/a Current symptoms/problems include none and have been stable. Daily foot checks: No   Any foot concerns: No Last eye exam: 1/22 Exercise: Gym/ health club routine includes swimming.  She is also involved in diabetes and nutrition classes.  She did have 1 hemoglobin A1c that was 6.5 otherwise its been below that.  She does take Xanax on an as-needed basis.  Continues on Lipitor without difficulty.  She is also taking HCTZ as well as a atenolol.  Continues on Advair.  The following portions of the patient's history were reviewed and updated as appropriate: allergies, current medications, past medical history, past social history and problem list.  ROS as in subjective above.     Objective:    Physical Exam Alert and in no distress otherwise not examined.  Hemoglobin A1c is 6.3  Blood pressure 128/78, pulse (!) 56, temperature 98.3 F (36.8 C), weight 191 lb 9.6 oz (86.9 kg), last menstrual period 04/25/1998.  Lab Review    Latest Ref Rng & Units 05/25/2022    8:28 AM 03/11/2022    8:57 AM 01/06/2022    9:37 AM 01/06/2022    9:32 AM 03/11/2021    2:26 PM  Diabetic Labs  HbA1c 4.0 - 5.6 % 6.3   6.5  6.3    Chol 100 - 199 mg/dL   204     HDL >39 mg/dL   71     Calc LDL 0 - 99 mg/dL   111     Triglycerides 0 - 149 mg/dL   124     Creatinine 0.44 - 1.00 mg/dL  0.73  0.69   1.02       05/25/2022    8:12 AM 05/13/2022    9:05 AM 03/11/2022    9:25 AM 01/27/2022    1:04 PM 01/13/2022    1:33 PM  BP/Weight  Systolic BP 295  188 416   Diastolic BP 78  67 72   Wt. (Lbs) 191.6 191 195.6 193.6 196  BMI 33.94 kg/m2 33.83 kg/m2 34.65 kg/m2 34.29 kg/m2 34.72 kg/m2       No data  to display          Denise [name]  reports that she has never smoked. She has never used smokeless tobacco. She reports that she does not drink alcohol and does not use drugs.     Assessment & Plan:    Glucose intolerance - Plan: HgB A1c  Obstructive sleep apnea syndrome  History of diffuse large B-cell lymphoma  Anxiety She did have 1 A1c of 6.5 but otherwise has been below that number.  Discussed diabetes and glucose intolerance and continuing.  Strongly encouraged her to continue with her exercise pattern but make further changes in her dietary intake of carbohydrates to help with weight reduction.  She is relatively stable with this as well as with her underlying lymphoma and recommend follow-up in roughly 6 months.  She was comfortable with that.

## 2022-06-01 ENCOUNTER — Encounter: Payer: Self-pay | Admitting: Internal Medicine

## 2022-06-15 ENCOUNTER — Other Ambulatory Visit: Payer: Self-pay | Admitting: Family Medicine

## 2022-06-15 DIAGNOSIS — I10 Essential (primary) hypertension: Secondary | ICD-10-CM

## 2022-07-07 ENCOUNTER — Other Ambulatory Visit: Payer: Self-pay | Admitting: Family Medicine

## 2022-07-07 DIAGNOSIS — Z638 Other specified problems related to primary support group: Secondary | ICD-10-CM

## 2022-07-07 NOTE — Telephone Encounter (Signed)
Is this okay to refill? 

## 2022-07-09 ENCOUNTER — Other Ambulatory Visit: Payer: Self-pay | Admitting: Family Medicine

## 2022-07-09 NOTE — Telephone Encounter (Signed)
Is this okay to refill? 

## 2022-07-13 DIAGNOSIS — L503 Dermatographic urticaria: Secondary | ICD-10-CM | POA: Diagnosis not present

## 2022-07-13 DIAGNOSIS — D225 Melanocytic nevi of trunk: Secondary | ICD-10-CM | POA: Diagnosis not present

## 2022-07-13 DIAGNOSIS — L821 Other seborrheic keratosis: Secondary | ICD-10-CM | POA: Diagnosis not present

## 2022-07-28 ENCOUNTER — Ambulatory Visit (INDEPENDENT_AMBULATORY_CARE_PROVIDER_SITE_OTHER): Payer: Medicare Other | Admitting: Podiatry

## 2022-07-28 DIAGNOSIS — M7752 Other enthesopathy of left foot: Secondary | ICD-10-CM | POA: Diagnosis not present

## 2022-07-28 DIAGNOSIS — M7751 Other enthesopathy of right foot: Secondary | ICD-10-CM | POA: Diagnosis not present

## 2022-08-04 NOTE — Progress Notes (Signed)
Subjective:  Patient ID: Denise [name], female    DOB: 1956/01/23,  MRN: [id number]  Chief Complaint  Patient presents with   Foot Pain    Bilateral foot pain, wants injections.    66 y.o. female presents with the above complaint.  Patient presents with complaint of bilateral second metatarsophalangeal joint pain.  She states that the injection helped.  She would like to do another injection.  The injection does not last longer.   Review of Systems: Negative except as noted in the HPI. Denies N/V/F/Ch.  Past Medical History:  Diagnosis Date   Allergy    Asthma    Bursitis of left hip    Fibroids    TAH in 1999   GERD (gastroesophageal reflux disease)    Lymphoma ([place])    T cell lymphoma   PVC's (premature ventricular contractions)    Sleep apnea    does not use CPAP    Current Outpatient Medications:    albuterol (VENTOLIN HFA) 108 (90 Base) MCG/ACT inhaler, INHALE 2 PUFFS INTO THE LUNGS EVERY 6 HOURS AS NEEDED FOR WHEEZING OR SHORTNESS OF BREATH, Disp: 8.5 g, Rfl: 0   ALPRAZolam (XANAX) 0.25 MG tablet, TAKE 1 TABLET BY MOUTH TWICE DAILY AS NEEDED, Disp: 50 tablet, Rfl: 0   ALPRAZolam (XANAX) 0.25 MG tablet, TAKE 1 TABLET BY MOUTH TWICE DAILY AS NEEDED, Disp: 50 tablet, Rfl: 0   aspirin 81 MG chewable tablet, Chew 81 mg by mouth daily as needed for moderate pain., Disp: , Rfl:    atenolol (TENORMIN) 50 MG tablet, Take 1 tablet (50 mg total) by mouth 2 (two) times daily., Disp: 180 tablet, Rfl: 3   atorvastatin (LIPITOR) 20 MG tablet, Take 1 tablet (20 mg total) by mouth daily., Disp: 90 tablet, Rfl: 3   fluticasone (FLONASE) 50 MCG/ACT nasal spray, Place into the nose., Disp: , Rfl:    fluticasone-salmeterol (ADVAIR) 100-50 MCG/ACT AEPB, INHALE 1 PUFF INTO THE LUNGS TWICE DAILY, Disp: 60 each, Rfl: 5   hydrochlorothiazide (MICROZIDE) 12.5 MG capsule, TAKE 1 CAPSULE BY MOUTH DAILY, Disp: 30 capsule, Rfl: 2   ibuprofen (ADVIL) 800 MG tablet, TAKE 1 TABLET BY MOUTH EVERY 6  HOURS AS NEEDED, Disp: 60 tablet, Rfl: 1   ondansetron (ZOFRAN ODT) 4 MG disintegrating tablet, Take 1 tablet (4 mg total) by mouth every 8 (eight) hours as needed for nausea or vomiting., Disp: 12 tablet, Rfl: 0   Probiotic Product (PROBIOTIC DAILY PO), Take 1 capsule by mouth daily., Disp: , Rfl:    triazolam (HALCION) 0.25 MG tablet, TAKE 1 TABLET BY MOUTH EACH NIGHT AT BEDTIME AS NEEDED FOR SLEEP, Disp: 90 tablet, Rfl: 0  Social History   Tobacco Use  Smoking Status Never  Smokeless Tobacco Never    Allergies  Allergen Reactions   Morphine And Related    Objective:  There were no vitals filed for this visit. There is no height or weight on file to calculate BMI. Constitutional Well developed. Well nourished.  Vascular Dorsalis pedis pulses palpable bilaterally. Posterior tibial pulses palpable bilaterally. Capillary refill normal to all digits.  No cyanosis or clubbing noted. Pedal hair growth normal.  Neurologic Normal speech. Oriented to person, place, and time. Epicritic sensation to light touch grossly present bilaterally.  Dermatologic Nails well groomed and normal in appearance. No open wounds. No skin lesions.  Orthopedic:  Pain on palpation to the range of motion of bilateral second metatarsophalangeal joint.  No deep intra-articular pain noted.  No plantar  plate involvement noted.  Negative Mulder's click noted.  No signs of neuroma noted.   Radiographs: 3 views of skeletally mature adult bilateral foot severe osteoarthritic changes noted of the first and second metatarsal phalangeal joint.  Medial deviation of the digits noted.  History of prior surgery.  Cavovarus foot structure noted bilaterally.  Severe pes planovalgus noted.  Midfoot arthritis noted.  No other bony abnormalities identified.  Assessment:   No diagnosis found.     Plan:  Patient was evaluated and treated and all questions answered.  Bilateral plantar fat pad atrophy with underlying  metatarsalgia with underlying pes planovalgus -Clinically resolved with orthotics  Bilateral second metatarsophalangeal joint capsulitis~recurrence -Explained the patient etiology of capsulitis and various treatment options were extensively discussed.  Given the amount of pain that she is having I believe she will benefit from a steroid injection of decrease acute inflammatory component associated pain.  She states understanding like to proceed with a steroid injection. -Another steroid injection was performed at second metatarsophalangeal joint bilateral using 1% plain Lidocaine and 10 mg of Kenalog. This was well tolerated.   No follow-ups on file.

## 2022-08-12 DIAGNOSIS — H43393 Other vitreous opacities, bilateral: Secondary | ICD-10-CM | POA: Diagnosis not present

## 2022-08-12 DIAGNOSIS — H2513 Age-related nuclear cataract, bilateral: Secondary | ICD-10-CM | POA: Diagnosis not present

## 2022-08-16 ENCOUNTER — Telehealth: Payer: Self-pay | Admitting: Family Medicine

## 2022-08-16 NOTE — Telephone Encounter (Signed)
P.A. ADVAIR

## 2022-08-17 NOTE — Telephone Encounter (Signed)
Received rejection that [employer] (generic for Advair) not covered, Brand Advair is preferred.  Called pharmacy had them reprocess and went thru her [employer] for $56.  Left message for pt

## 2022-08-18 ENCOUNTER — Other Ambulatory Visit: Payer: Self-pay | Admitting: Family Medicine

## 2022-08-18 DIAGNOSIS — J4530 Mild persistent asthma, uncomplicated: Secondary | ICD-10-CM

## 2022-08-18 NOTE — Telephone Encounter (Signed)
Refill request last apt 05/25/22 next apt 01/20/23.

## 2022-09-11 DIAGNOSIS — Z8572 Personal history of non-Hodgkin lymphomas: Secondary | ICD-10-CM | POA: Diagnosis not present

## 2022-09-11 DIAGNOSIS — Z885 Allergy status to narcotic agent status: Secondary | ICD-10-CM | POA: Diagnosis not present

## 2022-09-11 DIAGNOSIS — C8478 Anaplastic large cell lymphoma, ALK-negative, lymph nodes of multiple sites: Secondary | ICD-10-CM | POA: Diagnosis not present

## 2022-09-11 DIAGNOSIS — Z08 Encounter for follow-up examination after completed treatment for malignant neoplasm: Secondary | ICD-10-CM | POA: Diagnosis not present

## 2022-09-11 DIAGNOSIS — Z9484 Stem cells transplant status: Secondary | ICD-10-CM | POA: Diagnosis not present

## 2022-09-16 ENCOUNTER — Other Ambulatory Visit: Payer: Self-pay | Admitting: Family Medicine

## 2022-09-16 DIAGNOSIS — I10 Essential (primary) hypertension: Secondary | ICD-10-CM

## 2022-10-13 ENCOUNTER — Other Ambulatory Visit: Payer: Self-pay | Admitting: Family Medicine

## 2022-10-13 DIAGNOSIS — Z638 Other specified problems related to primary support group: Secondary | ICD-10-CM

## 2022-10-13 NOTE — Telephone Encounter (Signed)
Is this okay to refill? 

## 2022-12-15 ENCOUNTER — Other Ambulatory Visit: Payer: Self-pay | Admitting: Family Medicine

## 2022-12-15 DIAGNOSIS — I10 Essential (primary) hypertension: Secondary | ICD-10-CM

## 2023-01-01 ENCOUNTER — Other Ambulatory Visit: Payer: Self-pay | Admitting: Family Medicine

## 2023-01-01 DIAGNOSIS — I10 Essential (primary) hypertension: Secondary | ICD-10-CM

## 2023-01-14 ENCOUNTER — Other Ambulatory Visit: Payer: Self-pay | Admitting: Family Medicine

## 2023-01-14 DIAGNOSIS — Z638 Other specified problems related to primary support group: Secondary | ICD-10-CM

## 2023-01-14 NOTE — Telephone Encounter (Signed)
Is it ok to refill? Last refilled on 10/13/2022, qty 50, with 0 refills.

## 2023-01-20 ENCOUNTER — Ambulatory Visit (INDEPENDENT_AMBULATORY_CARE_PROVIDER_SITE_OTHER): Payer: Medicare HMO | Admitting: Podiatry

## 2023-01-20 ENCOUNTER — Ambulatory Visit (INDEPENDENT_AMBULATORY_CARE_PROVIDER_SITE_OTHER): Payer: Medicare HMO | Admitting: Family Medicine

## 2023-01-20 ENCOUNTER — Encounter: Payer: Self-pay | Admitting: Family Medicine

## 2023-01-20 VITALS — BP 134/68 | HR 58 | Temp 97.7°F | Resp 16 | Ht 63.0 in | Wt 192.0 lb

## 2023-01-20 DIAGNOSIS — H8111 Benign paroxysmal vertigo, right ear: Secondary | ICD-10-CM | POA: Diagnosis not present

## 2023-01-20 DIAGNOSIS — M7752 Other enthesopathy of left foot: Secondary | ICD-10-CM | POA: Diagnosis not present

## 2023-01-20 DIAGNOSIS — J4530 Mild persistent asthma, uncomplicated: Secondary | ICD-10-CM | POA: Diagnosis not present

## 2023-01-20 DIAGNOSIS — Z0000 Encounter for general adult medical examination without abnormal findings: Secondary | ICD-10-CM | POA: Diagnosis not present

## 2023-01-20 DIAGNOSIS — G4733 Obstructive sleep apnea (adult) (pediatric): Secondary | ICD-10-CM | POA: Diagnosis not present

## 2023-01-20 DIAGNOSIS — E118 Type 2 diabetes mellitus with unspecified complications: Secondary | ICD-10-CM | POA: Diagnosis not present

## 2023-01-20 DIAGNOSIS — M1990 Unspecified osteoarthritis, unspecified site: Secondary | ICD-10-CM | POA: Diagnosis not present

## 2023-01-20 DIAGNOSIS — K219 Gastro-esophageal reflux disease without esophagitis: Secondary | ICD-10-CM

## 2023-01-20 DIAGNOSIS — I493 Ventricular premature depolarization: Secondary | ICD-10-CM | POA: Diagnosis not present

## 2023-01-20 DIAGNOSIS — Z8572 Personal history of non-Hodgkin lymphomas: Secondary | ICD-10-CM

## 2023-01-20 DIAGNOSIS — E119 Type 2 diabetes mellitus without complications: Secondary | ICD-10-CM

## 2023-01-20 DIAGNOSIS — I10 Essential (primary) hypertension: Secondary | ICD-10-CM

## 2023-01-20 DIAGNOSIS — M7751 Other enthesopathy of right foot: Secondary | ICD-10-CM

## 2023-01-20 DIAGNOSIS — F419 Anxiety disorder, unspecified: Secondary | ICD-10-CM | POA: Diagnosis not present

## 2023-01-20 DIAGNOSIS — E669 Obesity, unspecified: Secondary | ICD-10-CM

## 2023-01-20 DIAGNOSIS — Z9484 Stem cells transplant status: Secondary | ICD-10-CM

## 2023-01-20 DIAGNOSIS — J301 Allergic rhinitis due to pollen: Secondary | ICD-10-CM | POA: Diagnosis not present

## 2023-01-20 MED ORDER — FLUTICASONE-SALMETEROL 100-50 MCG/ACT IN AEPB
INHALATION_SPRAY | RESPIRATORY_TRACT | 5 refills | Status: DC
Start: 2023-01-20 — End: 2023-09-13

## 2023-01-20 MED ORDER — HYDROCHLOROTHIAZIDE 12.5 MG PO CAPS: 12.5000 mg | ORAL_CAPSULE | Freq: Every day | ORAL | 1 refills | Status: DC

## 2023-01-20 MED ORDER — ALBUTEROL SULFATE HFA 108 (90 BASE) MCG/ACT IN AERS: 2.0000 | INHALATION_SPRAY | Freq: Four times a day (QID) | RESPIRATORY_TRACT | 0 refills | Status: DC | PRN

## 2023-01-20 MED ORDER — ATENOLOL 50 MG PO TABS: 50.0000 mg | ORAL_TABLET | Freq: Two times a day (BID) | ORAL | 1 refills | Status: DC

## 2023-01-20 NOTE — Progress Notes (Signed)
Denise [name] is a 67 y.o. female who presents for annual wellness visit,CPE and follow-up on chronic medical conditions.  She seems to be doing quite well.  She does have a previous history of a hemoglobin A1c of 6.6 but since then the numbers of all been lower than this.  She has been trying to make diet and exercise changes.  She does keep yourself relatively physically active.  She is taking atenolol as well as HCTZ.  She continues on Advair and rarely uses albuterol.  Psychologically she is doing okay and not using Halcion anymore.  She does use Xanax but on a very sparing basis.  Continues on atorvastatin without any difficulty.  Does have a previous history of COVID.  She does have a history of PVCs.  Her allergies seem to be under good control.  She has a previous history of sleep apnea but is not on any medications at the present time.  She is not taking anything for her GERD.  She does have arthritis and occasionally uses ibuprofen.  She has a previous history of large B-cell lymphoma and is doing quite nicely after stem cell transplant.  Immunizations and Health Maintenance Immunization History  Administered Date(s) Administered   DTaP / IPV 10/21/2017, 01/13/2018, 04/14/2018   HIB (PRP-OMP) 10/21/2017, 01/13/2018, 04/14/2018   HIB (PRP-T) 10/21/2017, 01/13/2018, 04/14/2018   Hepatitis B 10/21/2017, 01/13/2018, 10/13/2018   Influenza Split 04/19/2019   Influenza Whole 08/05/1999, 04/23/2000, 04/02/2011   Influenza, Seasonal, Injecte, Preservative Fre 08/13/2012   Influenza,inj,Quad PF,6+ Mos 04/19/2019   Influenza-Unspecified 08/13/2012, 06/29/2014, 10/21/2017, 04/19/2019   MMR 04/13/2019   PFIZER Comirnaty(Gray Top)Covid-19 Tri-Sucrose Vaccine 12/27/2020   PFIZER(Purple Top)SARS-COV-2 Vaccination 10/06/2019, 10/27/2019, 05/25/2020   Pfizer Covid-19 Vaccine Bivalent Booster 72yrs & up 05/28/2021, 01/06/2022   Pneumococcal Conjugate-13 10/21/2017, 01/13/2018, 04/14/2018    Pneumococcal Polysaccharide-23 10/13/2018   Td 04/14/1991, 02/03/1999   Tdap 04/02/2011, 12/27/2020   Zoster Recombinat (Shingrix) 01/31/2019, 04/19/2019   Zoster, Unspecified 01/31/2019, 04/19/2019   Health Maintenance Due  Topic Date Due   FOOT EXAM  Never done   OPHTHALMOLOGY EXAM  Never done   Diabetic kidney evaluation - Urine ACR  Never done   COVID-19 Vaccine (8 - 2023-24 season) 07/05/2022   HEMOGLOBIN A1C  11/24/2022   Colonoscopy  03/04/2023    Last Pap smear: n/a Last mammogram: 04/21/2022 Last colonoscopy: 2014 Last DEXA: 01/22/2022 Dentist: 08/2022 Ophtho: within the last year Exercise: gym 3 times a week  Other doctors caring for patient include:  Advanced directives:[facility] Dr[facility] card  Dr Juluis Pitch [facility] Yes. Not on file    Depression screen:  See questionnaire below.     01/20/2023    1:46 PM 01/06/2022    8:28 AM 12/27/2020   11:21 AM 05/10/2018    4:11 PM  Depression screen PHQ 2/9  Decreased Interest 0 0 0 0  Down, Depressed, Hopeless 0 0 1 0  PHQ - 2 Score 0 0 1 0    Fall Risk Screen: see questionnaire below.    01/20/2023    1:46 PM 01/06/2022    8:28 AM 12/27/2020   11:21 AM 05/10/2018    4:11 PM  Fall Risk   Falls in the past year? 0 0 0 No  Number falls in past yr: 0 0 0   Injury with Fall? 0 0 0   Risk for fall due to : No Fall Risks No Fall Risks No Fall Risks   Follow up Falls evaluation  completed Falls evaluation completed      ADL screen:  See questionnaire below Functional Status Survey: Is the patient deaf or have difficulty hearing?: No Does the patient have difficulty seeing, even when wearing glasses/contacts?: No Does the patient have difficulty concentrating, remembering, or making decisions?: No Does the patient have difficulty walking or climbing stairs?: No Does the patient have difficulty dressing or bathing?: No Does the patient have difficulty doing errands alone such as visiting a doctor's office or  shopping?: No   Review of Systems Constitutional: -, -unexpected weight change, -anorexia, -fatigue Allergy: -sneezing, -itching, -congestion Dermatology: denies changing moles, rash, lumps ENT: -runny nose, -ear pain, -sore throat,  Cardiology:  -chest pain, -palpitations, -orthopnea, Respiratory: -cough, -shortness of breath, -dyspnea on exertion, -wheezing,  Gastroenterology: -abdominal pain, -nausea, -vomiting, -diarrhea, -constipation, -dysphagia Hematology: -bleeding or bruising problems Musculoskeletal: -arthralgias, -myalgias, -joint swelling, -back pain, - Ophthalmology: -vision changes,  Urology: -dysuria, -difficulty urinating,  -urinary frequency, -urgency, incontinence Neurology: -, -numbness, , -memory loss, -falls, -dizziness    PHYSICAL EXAM:  BP 134/68   Pulse (!) 58   Temp 97.7 F (36.5 C) (Oral)   Resp 16   Ht 5\' 3"  (1.6 m)   Wt 192 lb (87.1 kg)   LMP 04/25/1998 (Exact Date)   SpO2 98% Comment: room air  BMI 34.01 kg/m   General Appearance: Alert, cooperative, no distress, appears stated age Head: Normocephalic, without obvious abnormality, atraumatic Eyes: PERRL, conjunctiva/corneas clear, EOM's intact, Ears: Normal TM's and external ear canals Nose: Nares normal, mucosa normal, no drainage or sinus tenderness Throat: Lips, mucosa, and tongue normal; teeth and gums normal Neck: Supple, no lymphadenopathy;  thyroid:  no enlargement/tenderness/nodules; no carotid bruit or JVD Lungs: Clear to auscultation bilaterally without wheezes, rales or ronchi; respirations unlabored Heart: Regular rate and rhythm, S1 and S2 normal, no murmur, rubor gallop Abdomen: Soft, non-tender, nondistended, normoactive bowel sounds,  no masses, no hepatosplenomegaly Extremities: No clubbing, cyanosis or edema Pulses: 2+ and symmetric all extremities Skin:  Skin color, texture, turgor normal, no rashes or lesions Lymph nodes: Cervical, supraclavicular, and axillary nodes  normal Neurologic:  CNII-XII intact, normal strength, sensation and gait; reflexes 2+ and symmetric throughout Psych: Normal mood, affect, hygiene and grooming.  ASSESSMENT/PLAN: Routine general medical examination at a health care facility  Essential hypertension - Plan: atenolol (TENORMIN) 50 MG tablet, hydrochlorothiazide (MICROZIDE) 12.5 MG capsule  PVC's (premature ventricular contractions)  Allergic rhinitis due to pollen, unspecified seasonality  Mild persistent extrinsic asthma without complication - Plan: fluticasone-salmeterol (ADVAIR) 100-50 MCG/ACT AEPB  New onset type 2 diabetes mellitus (HCC)  Arthritis  History of diffuse large B-cell lymphoma  H/O autologous stem cell transplant (HCC)  Obesity (BMI 30-39.9)  Anxiety  Gastroesophageal reflux disease without esophagitis  Obstructive sleep apnea syndrome  Extrinsic asthma, mild persistent, uncomplicated - Plan: albuterol (VENTOLIN HFA) 108 (90 Base) MCG/ACT inhaler  Controlled type 2 diabetes mellitus with complication, without long-term current use of insulin (HCC) - Plan: Hemoglobin A1c  Benign paroxysmal positional vertigo of right ear    Discussed  activity at least 5 days/week and weight-bearing exercise 2x/week; proper sunscreen use reviewed; healthy diet, including goals of calcium and vitamin D intake and alcohol recommendations (less than or equal to 1 drink/day) reviewed;  Immunization recommendations discussed.  Colonoscopy recommendations reviewed recommend she check out the DASH diet.  Congratulated her on stopping the Halcion.  Did recommend she get RSV, flu shot and COVID this fall.  Follow-up after blood work is  back concerning her diabetes and any other abnormalities.   Medicare Attestation I have personally reviewed: The patient's medical and social history Their use of alcohol, tobacco or illicit drugs Their current medications and supplements The patient's functional ability including  ADLs,fall risks, home safety risks, cognitive, and hearing and visual impairment Diet and physical activities Evidence for depression or mood disorders  The patient's weight, height, and BMI have been recorded in the chart.  I have made referrals, counseling, and provided education to the patient based on review of the above and I have provided the patient with a written personalized care plan for preventive services.     Sharlot Gowda, MD   01/20/2023

## 2023-01-20 NOTE — Progress Notes (Signed)
Subjective:  Patient ID: Denise [name], female    DOB: 1956/05/04,  MRN: [id number]  Chief Complaint  Patient presents with   Injections    Pt stated that she still has some discomfort she is requesting lots of cold spray     67 y.o. female presents with the above complaint.  Patient presents with complaint of bilateral second metatarsophalangeal joint pain.  She states that the injection helped.  She would like to do another injection.  The injection does not last longer.   Review of Systems: Negative except as noted in the HPI. Denies N/V/F/Ch.  Past Medical History:  Diagnosis Date   Allergy    Asthma    Bursitis of left hip    Fibroids    TAH in 1999   GERD (gastroesophageal reflux disease)    Lymphoma (HCC)    T cell lymphoma   PVC's (premature ventricular contractions)    Sleep apnea    does not use CPAP    Current Outpatient Medications:    albuterol (VENTOLIN HFA) 108 (90 Base) MCG/ACT inhaler, INHALE 2 PUFFS INTO THE LUNGS EVERY 6 HOURS AS NEEDED FOR WHEEZING OR SHORTNESS OF BREATH, Disp: 8.5 g, Rfl: 0   ALPRAZolam (XANAX) 0.25 MG tablet, TAKE 1 TABLET BY MOUTH TWICE DAILY AS NEEDED, Disp: 50 tablet, Rfl: 0   ALPRAZolam (XANAX) 0.25 MG tablet, TAKE 1 TABLET BY MOUTH TWICE DAILY AS NEEDED, Disp: 50 tablet, Rfl: 0   aspirin 81 MG chewable tablet, Chew 81 mg by mouth daily as needed for moderate pain., Disp: , Rfl:    atenolol (TENORMIN) 50 MG tablet, TAKE 1 TABLET BY MOUTH TWICE DAILY, Disp: 180 tablet, Rfl: 1   atorvastatin (LIPITOR) 20 MG tablet, Take 1 tablet (20 mg total) by mouth daily., Disp: 90 tablet, Rfl: 3   fluticasone (FLONASE) 50 MCG/ACT nasal spray, Place into the nose., Disp: , Rfl:    fluticasone-salmeterol (ADVAIR) 100-50 MCG/ACT AEPB, INHALE 1 PUFF INTO THE LUNGS TWICE DAILY, Disp: 60 each, Rfl: 5   hydrochlorothiazide (MICROZIDE) 12.5 MG capsule, TAKE 1 CAPSULE BY MOUTH DAILY, Disp: 30 capsule, Rfl: 1   ibuprofen (ADVIL) 800 MG tablet, TAKE 1  TABLET BY MOUTH EVERY 6 HOURS AS NEEDED, Disp: 60 tablet, Rfl: 1   ondansetron (ZOFRAN ODT) 4 MG disintegrating tablet, Take 1 tablet (4 mg total) by mouth every 8 (eight) hours as needed for nausea or vomiting., Disp: 12 tablet, Rfl: 0   Probiotic Product (PROBIOTIC DAILY PO), Take 1 capsule by mouth daily., Disp: , Rfl:    triazolam (HALCION) 0.25 MG tablet, TAKE 1 TABLET BY MOUTH EACH NIGHT AT BEDTIME AS NEEDED FOR SLEEP, Disp: 90 tablet, Rfl: 0  Social History   Tobacco Use  Smoking Status Never  Smokeless Tobacco Never    Allergies  Allergen Reactions   Morphine And Codeine    Objective:  There were no vitals filed for this visit. There is no height or weight on file to calculate BMI. Constitutional Well developed. Well nourished.  Vascular Dorsalis pedis pulses palpable bilaterally. Posterior tibial pulses palpable bilaterally. Capillary refill normal to all digits.  No cyanosis or clubbing noted. Pedal hair growth normal.  Neurologic Normal speech. Oriented to person, place, and time. Epicritic sensation to light touch grossly present bilaterally.  Dermatologic Nails well groomed and normal in appearance. No open wounds. No skin lesions.  Orthopedic:  Pain on palpation to the range of motion of bilateral second metatarsophalangeal joint.  No deep intra-articular  pain noted.  No plantar plate involvement noted.  Negative Mulder's click noted.  No signs of neuroma noted.   Radiographs: 3 views of skeletally mature adult bilateral foot severe osteoarthritic changes noted of the first and second metatarsal phalangeal joint.  Medial deviation of the digits noted.  History of prior surgery.  Cavovarus foot structure noted bilaterally.  Severe pes planovalgus noted.  Midfoot arthritis noted.  No other bony abnormalities identified.  Assessment:   1. Capsulitis of metatarsophalangeal (MTP) joint of right foot   2. Capsulitis of metatarsophalangeal (MTP) joint of left foot         Plan:  Patient was evaluated and treated and all questions answered.  Bilateral plantar fat pad atrophy with underlying metatarsalgia with underlying pes planovalgus -Clinically resolved with orthotics  Bilateral second metatarsophalangeal joint capsulitis~recurrence -Explained the patient etiology of capsulitis and various treatment options were extensively discussed.  Given the amount of pain that she is having I believe she will benefit from a steroid injection of decrease acute inflammatory component associated pain.  She states understanding like to proceed with a steroid injection. -Another steroid injection was performed at second metatarsophalangeal joint bilateral using 1% plain Lidocaine and 10 mg of Kenalog. This was well tolerated.   No follow-ups on file.

## 2023-01-20 NOTE — Patient Instructions (Signed)
Denise [name] , Thank you for taking time to come for your Medicare Wellness Visit. I appreciate your ongoing commitment to your health goals. Please review the following plan we discussed and let me know if I can assist you in the future.   These are the goals we discussed:  Goals   None     This is a list of the screening recommended for you and due dates:  Health Maintenance  Topic Date Due   Complete foot exam   Never done   Eye exam for diabetics  Never done   Yearly kidney health urinalysis for diabetes  Never done   COVID-19 Vaccine (8 - 2023-24 season) 07/05/2022   Hemoglobin A1C  11/24/2022   Colon Cancer Screening  03/04/2023   Flu Shot  03/11/2023   Yearly kidney function blood test for diabetes  03/12/2023   Mammogram  04/22/2023   Pneumonia Vaccine (3 of 3 - PPSV23 or PCV20) 10/13/2023   Medicare Annual Wellness Visit  01/20/2024   DTaP/Tdap/Td vaccine (8 - Td or Tdap) 12/28/2030   DEXA scan (bone density measurement)  Completed   Hepatitis C Screening  Completed   Zoster (Shingles) Vaccine  Completed   HPV Vaccine  Aged Out    Benign Positional Vertigo Vertigo is the feeling that you or your surroundings are moving when they are not. Benign positional vertigo is the most common form of vertigo. This is usually a harmless condition (benign). This condition is positional. This means that symptoms are triggered by certain movements and positions. This condition can be dangerous if it occurs while you are doing something that could cause harm to yourself or others. This includes activities such as driving or operating machinery. What are the causes? The inner ear has fluid-filled canals that help your brain sense movement and balance. When the fluid moves, the brain receives messages about your body's position. With benign positional vertigo, calcium crystals in the inner ear break free and disturb the inner ear area. This causes your brain to receive confusing messages about  your body's position. What increases the risk? You are more likely to develop this condition if: You are a woman. You are 68 years of age or older. You have recently had a head injury. You have an inner ear disease. What are the signs or symptoms? Symptoms of this condition usually happen when you move your head or your eyes in different directions. Symptoms may start suddenly and usually last for less than a minute. They include: Loss of balance and falling. Feeling like you are spinning or moving. Feeling like your surroundings are spinning or moving. Nausea and vomiting. Blurred vision. Dizziness. Involuntary eye movement (nystagmus). Symptoms can be mild and cause only minor problems, or they can be severe and interfere with daily life. Episodes of benign positional vertigo may return (recur) over time. Symptoms may also improve over time. How is this diagnosed? This condition may be diagnosed based on: Your medical history. A physical exam of the head, neck, and ears. Positional tests to check for or stimulate vertigo. You may be asked to turn your head and change positions, such as going from sitting to lying down. A health care provider will watch for symptoms of vertigo. You may be referred to a health care provider who specializes in ear, nose, and throat problems (ENT or otolaryngologist) or a provider who specializes in disorders of the nervous system (neurologist). How is this treated?  This condition may be treated in  a session in which your health care provider moves your head in specific positions to help the displaced crystals in your inner ear move. Treatment for this condition may take several sessions. Surgery may be needed in severe cases, but this is rare. In some cases, benign positional vertigo may resolve on its own in 2-4 weeks. Follow these instructions at home: Safety Move slowly. Avoid sudden body or head movements or certain positions, as told by your health  care provider. Avoid driving or operating machinery until your health care provider says it is safe. Avoid doing any tasks that would be dangerous to you or others if vertigo occurs. If you have trouble walking or keeping your balance, try using a cane for stability. If you feel dizzy or unstable, sit down right away. Return to your normal activities as told by your health care provider. Ask your health care provider what activities are safe for you. General instructions Take over-the-counter and prescription medicines only as told by your health care provider. Drink enough fluid to keep your urine pale yellow. Keep all follow-up visits. This is important. Contact a health care provider if: You have a fever. Your condition gets worse or you develop new symptoms. Your family or friends notice any behavioral changes. You have nausea or vomiting that gets worse. You have numbness or a prickling and tingling sensation. Get help right away if you: Have difficulty speaking or moving. Are always dizzy or faint. Develop severe headaches. Have weakness in your legs or arms. Have changes in your hearing or vision. Develop a stiff neck. Develop sensitivity to light. These symptoms may represent a serious problem that is an emergency. Do not wait to see if the symptoms will go away. Get medical help right away. Call your local emergency services (911 in the [place] Do not drive yourself to the hospital. Summary Vertigo is the feeling that you or your surroundings are moving when they are not. Benign positional vertigo is the most common form of vertigo. This condition is caused by calcium crystals in the inner ear that become displaced. This causes a disturbance in an area of the inner ear that helps your brain sense movement and balance. Symptoms include loss of balance and falling, feeling that you or your surroundings are moving, nausea and vomiting, and blurred vision. This condition can be  diagnosed based on symptoms, a physical exam, and positional tests. Follow safety instructions as told by your health care provider and keep all follow-up visits. This is important. This information is not intended to replace advice given to you by your health care provider. Make sure you discuss any questions you have with your health care provider. Document Revised: 06/26/2020 Document Reviewed: 06/26/2020 Elsevier Patient Education  2024 [employer] How to Perform the Epley Maneuver The Epley maneuver is an exercise that relieves symptoms of vertigo. Vertigo is the feeling that you or your surroundings are moving when they are not. When you feel vertigo, you may feel like the room is spinning and may have trouble walking. The Epley maneuver is used for a type of vertigo caused by a calcium deposit in a part of the inner ear. The maneuver involves changing head positions to help the deposit move out of the area. You can do this maneuver at home whenever you have symptoms of vertigo. You can repeat it in 24 hours if your vertigo has not gone away. Even though the Epley maneuver may relieve your vertigo for a few weeks, it  is possible that your symptoms will return. This maneuver relieves vertigo, but it does not relieve dizziness. What are the risks? If it is done correctly, the Epley maneuver is considered safe. Sometimes it can lead to dizziness or nausea that goes away after a short time. If you develop other symptoms--such as changes in vision, weakness, or numbness--stop doing the maneuver and call your health care provider. Supplies needed: A bed or table. A pillow. How to do the Epley maneuver     Sit on the edge of a bed or table with your back straight and your legs extended or hanging over the edge of the bed or table. Turn your head halfway toward the affected ear or side as told by your health care provider. Lie backward quickly with your head turned until you are lying flat on  your back. Your head should dangle (head-hanging position). You may want to position a pillow under your shoulders. Hold this position for at least 30 seconds. If you feel dizzy or have symptoms of vertigo, continue to hold the position until the symptoms stop. Turn your head to the opposite direction until your unaffected ear is facing down. Your head should continue to dangle. Hold this position for at least 30 seconds. If you feel dizzy or have symptoms of vertigo, continue to hold the position until the symptoms stop. Turn your whole body to the same side as your head so that you are positioned on your side. Your head will now be nearly facedown and no longer needs to dangle. Hold for at least 30 seconds. If you feel dizzy or have symptoms of vertigo, continue to hold the position until the symptoms stop. Sit back up. You can repeat the maneuver in 24 hours if your vertigo does not go away. Follow these instructions at home: For 24 hours after doing the Epley maneuver: Keep your head in an upright position. When lying down to sleep or rest, keep your head raised (elevated) with two or more pillows. Avoid excessive neck movements. Activity Do not drive or use machinery if you feel dizzy. After doing the Epley maneuver, return to your normal activities as told by your health care provider. Ask your health care provider what activities are safe for you. General instructions Drink enough fluid to keep your urine pale yellow. Do not drink alcohol. Take over-the-counter and prescription medicines only as told by your health care provider. Keep all follow-up visits. This is important. Preventing vertigo symptoms Ask your health care provider if there is anything you should do at home to prevent vertigo. He or she may recommend that you: Keep your head elevated with two or more pillows while you sleep. Do not sleep on the side of your affected ear. Get up slowly from bed. Avoid sudden movements  during the day. Avoid extreme head positions or movement, such as looking up or bending over. Contact a health care provider if: Your vertigo gets worse. You have other symptoms, including: Nausea. Vomiting. Headache. Get help right away if you: Have vision changes. Have a headache or neck pain that is severe or getting worse. Cannot stop vomiting. Have new numbness or weakness in any part of your body. These symptoms may represent a serious problem that is an emergency. Do not wait to see if the symptoms will go away. Get medical help right away. Call your local emergency services (911 in the [place] Do not drive yourself to the hospital. Summary Vertigo is the feeling that you  or your surroundings are moving when they are not. The Epley maneuver is an exercise that relieves symptoms of vertigo. If the Epley maneuver is done correctly, it is considered safe. This information is not intended to replace advice given to you by your health care provider. Make sure you discuss any questions you have with your health care provider. Document Revised: 06/26/2020 Document Reviewed: 06/26/2020 Elsevier Patient Education  2024 [employer].

## 2023-01-21 LAB — HEMOGLOBIN A1C
Est. average glucose Bld gHb Est-mCnc: 146 mg/dL
Hgb A1c MFr Bld: 6.7 % — ABNORMAL HIGH (ref 4.8–5.6)

## 2023-02-01 ENCOUNTER — Telehealth: Payer: Self-pay | Admitting: Hematology

## 2023-02-15 ENCOUNTER — Other Ambulatory Visit: Payer: Self-pay | Admitting: Cardiovascular Disease

## 2023-03-16 ENCOUNTER — Other Ambulatory Visit: Payer: Medicare Other

## 2023-03-16 ENCOUNTER — Ambulatory Visit: Payer: Medicare Other | Admitting: Hematology

## 2023-03-17 ENCOUNTER — Other Ambulatory Visit: Payer: Self-pay

## 2023-03-17 DIAGNOSIS — C8478 Anaplastic large cell lymphoma, ALK-negative, lymph nodes of multiple sites: Secondary | ICD-10-CM

## 2023-03-18 ENCOUNTER — Inpatient Hospital Stay (HOSPITAL_BASED_OUTPATIENT_CLINIC_OR_DEPARTMENT_OTHER): Payer: BC Managed Care – PPO | Admitting: Hematology

## 2023-03-18 ENCOUNTER — Other Ambulatory Visit: Payer: Self-pay

## 2023-03-18 ENCOUNTER — Inpatient Hospital Stay: Payer: Medicare HMO | Attending: Hematology

## 2023-03-18 VITALS — BP 146/65 | HR 51 | Temp 97.8°F | Resp 18 | Wt 197.1 lb

## 2023-03-18 DIAGNOSIS — Z8572 Personal history of non-Hodgkin lymphomas: Secondary | ICD-10-CM | POA: Insufficient documentation

## 2023-03-18 DIAGNOSIS — C8478 Anaplastic large cell lymphoma, ALK-negative, lymph nodes of multiple sites: Secondary | ICD-10-CM | POA: Diagnosis not present

## 2023-03-18 LAB — LACTATE DEHYDROGENASE: LDH: 185 U/L (ref 98–192)

## 2023-03-18 LAB — CBC WITH DIFFERENTIAL (CANCER CENTER ONLY)
Abs Immature Granulocytes: 0.03 10*3/uL (ref 0.00–0.07)
Basophils Absolute: 0.1 10*3/uL (ref 0.0–0.1)
Basophils Relative: 1 %
Eosinophils Absolute: 0.1 10*3/uL (ref 0.0–0.5)
Eosinophils Relative: 2 %
HCT: 37.4 % (ref 36.0–46.0)
Hemoglobin: 13.2 g/dL (ref 12.0–15.0)
Immature Granulocytes: 1 %
Lymphocytes Relative: 27 %
Lymphs Abs: 1.6 10*3/uL (ref 0.7–4.0)
MCH: 30.5 pg (ref 26.0–34.0)
MCHC: 35.3 g/dL (ref 30.0–36.0)
MCV: 86.4 fL (ref 80.0–100.0)
Monocytes Absolute: 0.5 10*3/uL (ref 0.1–1.0)
Monocytes Relative: 9 %
Neutro Abs: 3.6 10*3/uL (ref 1.7–7.7)
Neutrophils Relative %: 60 %
Platelet Count: 144 10*3/uL — ABNORMAL LOW (ref 150–400)
RBC: 4.33 MIL/uL (ref 3.87–5.11)
RDW: 13.2 % (ref 11.5–15.5)
WBC Count: 5.9 10*3/uL (ref 4.0–10.5)
nRBC: 0 % (ref 0.0–0.2)

## 2023-03-18 LAB — CMP (CANCER CENTER ONLY)
ALT: 16 U/L (ref 0–44)
AST: 17 U/L (ref 15–41)
Albumin: 4 g/dL (ref 3.5–5.0)
Alkaline Phosphatase: 92 U/L (ref 38–126)
Anion gap: 5 (ref 5–15)
BUN: 16 mg/dL (ref 8–23)
CO2: 33 mmol/L — ABNORMAL HIGH (ref 22–32)
Calcium: 8.7 mg/dL — ABNORMAL LOW (ref 8.9–10.3)
Chloride: 103 mmol/L (ref 98–111)
Creatinine: 0.69 mg/dL (ref 0.44–1.00)
GFR, Estimated: >60 mL/min (ref >60)
Glucose, Bld: 84 mg/dL (ref 70–99)
Potassium: 3.9 mmol/L (ref 3.5–5.1)
Sodium: 141 mmol/L (ref 135–145)
Total Bilirubin: 0.4 mg/dL (ref 0.3–1.2)
Total Protein: 6.7 g/dL (ref 6.5–8.1)

## 2023-03-18 NOTE — Progress Notes (Signed)
HEMATOLOGY/ONCOLOGY CLINIC NOTE  Date of Service: 03/18/23   Patient Care Team: Ronnald Nian, MD as PCP - General (Family Medicine) Dr. 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 Valtrex resolved the shingles but she continued to have progressive adenopathy, cough and an unintentional weight loss. On 10/15/2016, a CT scan showed bulky adenopathy in the supraclavicular, mediastinum, hilar and abdomen. On 10/21/16, an ultrasound guided core biopsy of the left supraclavicular lymph node showed a T cell lymphoproliferative process with the differential diagnosis most suggestive of a CD30 positive PTCL, NOS or ALK negative anaplastic lymphoma. A PET scan on 10/27/16, redemonstrated bulky hypermetabolic lymphadenopathy throughout the body and a hypermetabolic mass in the central lung with multiple nodules and bilateral pleural effusions. The highest SUV was in the left para-aortic region at 21.4.    In early April 2018 , she started chemotherapy with CHOEP. Interim scans showed nearly completely resolved adenopathy, Deauville 1. Completed 5 cycles with CR1 prior to transplant.  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 Hydration and BCNU 300 mg/m2 in 500 ml D5W over 2 hours today. 08/30 - Day -5.  Cytarabine 200 mg/m2 in 250 ml NS over 1 hour and etoposide 200 mg/m2 in 1 L NS over 3 hours. 08/31 - Day -4. Cytarabine 200 mg/m2 in 250 ml NS over 1 hour and etoposide 200 mg/m2 in 1 L NS over 3 hours. 09/01 - Day -3. Cytarabine 200 mg/m2 in 250 ml NS over 1 hour and etoposide 200 mg/m2 in 1 L NS over 3 hours. 09/02 - Day -2. Cytarabine 200 mg/m2 in 250 ml NS over 1 hour and etoposide 200 mg/m2 in 1 L NS over 3 hours. 09/03 - Day -1. Melphalan 140 mg/m2 in 100 ml NS over 30 minutes.    04/13/2017 Transplant  Outpatient Autologous stem cell transplant - Day 0 on 04/13/2017  ASBMT risk stratification: low risk ECOG/KPS: 1/Able to carry on normal activities; minor signs/symptoms of disease HSCT-CI score: CR1 Preparative regimen/treatment protocol: BEAM  Engraftment:  WBC on 04/23/2017 with WBC 1.8 and ANC 1.1 Platelets: Anticipated for 9/21 - last platelet transfusion on 04/23/2017   04/18/2017 Complications  Transplant complications: Cx negative neutropenic fever - admitted to inpatient service and started on broad spectrum Zosyn which she continued until WBC engraftment on 04/23/2017  Mucositis: Grade II - managed with MMW    INTERVAL HISTORY  Denise [name] is here for her scheduled follow-up for her 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 She was last seen by me on 03/11/2022 and was doing well overall with no new medical concerns. Patient was seen by her transplant team about 18 months ago.  She was seen at the Survivorship clinic at [facility] in February 2024.   Today, she is accompanied by her friend. She reports that she is  feeling well overall over the last year with no new medical concerns.  Patient denies any new lumps/bumps, fever, chills, night sweats, energy, unexpected weight loss, abdominal pain/distension, or skin rashes.   Patient last received her COVID-19 vaccination in September 2023. She regularly stays active by going to the gym 3x a week.   MEDICAL HISTORY:   Past Medical History:  Diagnosis Date   Allergy    Asthma    Bursitis of left hip    Fibroids    TAH in 1999   GERD (gastroesophageal reflux disease)    Lymphoma (HCC)    T cell lymphoma   PVC's (premature ventricular contractions)    Sleep apnea    does not use CPAP    SURGICAL HISTORY: Past Surgical History:  Procedure Laterality Date   CARDIAC CATH  05/2007   J. BERRY   HAMMER TOE SURGERY Bilateral 2016   IR GENERIC HISTORICAL  11/05/2016   IR US GUIDE VASC ACCESS RIGHT 11/05/2016 Jolaine Click, MD [facility]   IR GENERIC HISTORICAL  11/05/2016   IR FLUORO GUIDE PORT INSERTION RIGHT 11/05/2016 Jolaine Click, MD [facility]   TOTAL ABDOMINAL HYSTERECTOMY  05/15/1998   secondary to fibroids   WISDOM TOOTH EXTRACTION  mid teens    SOCIAL HISTORY: Social History   Socioeconomic History   Marital status: Significant Other    Spouse name: Not on file   Number of children: 0   Years of education: Not on file   Highest education level: Not on file  Occupational History   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 [employer]    Employer: [employer]  Tobacco Use   Smoking status: Never   Smokeless tobacco: Never  Vaping Use   Vaping status: Never Used  Substance and Sexual Activity   Alcohol use: Yes    Alcohol/week: 7.0 standard drinks of alcohol    Types: 7 Glasses of wine per week   Drug use: No   Sexual activity: Yes    Partners: Female    Birth control/protection: None  Other Topics Concern   Not on file  Social History Narrative   Not on file   Social Determinants of Health   Financial Resource Strain: Not on file  Food Insecurity: Not on file  Transportation Needs: Not on file  Physical Activity: Not on file  Stress: Not on file  Social Connections: Not on file  Intimate Partner Violence: Not on file    FAMILY HISTORY: Family History  Problem Relation Age of Onset   Cancer Maternal Grandmother        GI cancer in 55's   Dementia  Mother    Heart disease Father    Heart failure Father     ALLERGIES:  is allergic to morphine and codeine.  MEDICATIONS:  Current Outpatient Medications  Medication Sig Dispense Refill   albuterol (VENTOLIN HFA) 108 (90 Base) MCG/ACT inhaler Inhale 2 puffs into the lungs every 6 (six) hours as needed for wheezing or shortness of breath. 8.5 g 0   ALPRAZolam (XANAX) 0.25 MG tablet TAKE 1 TABLET BY MOUTH TWICE DAILY AS NEEDED 50 tablet 0   aspirin 81 MG chewable tablet Chew 81 mg by mouth daily as needed for moderate pain. (Patient not taking: Reported on 01/20/2023)     atenolol (TENORMIN) 50 MG tablet Take 1 tablet (50 mg total) by mouth 2 (two) times daily. 180 tablet 1   atorvastatin (LIPITOR) 20 MG tablet TAKE 1 TABLET BY  MOUTH DAILY 90 tablet 3   fluticasone (FLONASE) 50 MCG/ACT nasal spray Place into the nose.     fluticasone-salmeterol (ADVAIR) 100-50 MCG/ACT AEPB INHALE 1 PUFF INTO THE LUNGS TWICE DAILY 60 each 5   hydrochlorothiazide (MICROZIDE) 12.5 MG capsule Take 1 capsule (12.5 mg total) by mouth daily. 30 capsule 1   ibuprofen (ADVIL) 800 MG tablet TAKE 1 TABLET BY MOUTH EVERY 6 HOURS AS NEEDED 60 tablet 1   ondansetron (ZOFRAN ODT) 4 MG disintegrating tablet Take 1 tablet (4 mg total) by mouth every 8 (eight) hours as needed for nausea or vomiting. 12 tablet 0   Probiotic Product (PROBIOTIC DAILY PO) Take 1 capsule by mouth daily.     Wheat Dextrin (EQ FIBER POWDER PO) Take by mouth.     No current facility-administered medications for this visit.    REVIEW OF SYSTEMS:    10 Point review of Systems was done is negative except as noted above.   PHYSICAL EXAMINATION:  ECOG FS:0 - Asymptomatic  Vitals:   03/18/23 1150  BP: (!) 146/65  Pulse: (!) 51  Resp: 18  Temp: 97.8 F (36.6 C)  SpO2: 99%     Wt Readings from Last 3 Encounters:  01/20/23 192 lb (87.1 kg)  05/25/22 191 lb 9.6 oz (86.9 kg)  05/13/22 191 lb (86.6 kg)   Body mass index is 34.91 kg/m.     GENERAL:alert, in no acute distress and comfortable SKIN: no acute rashes, no significant lesions EYES: conjunctiva are pink and non-injected, sclera anicteric OROPHARYNX: MMM, no exudates, no oropharyngeal erythema or ulceration NECK: supple, no JVD LYMPH:  no palpable lymphadenopathy in the cervical, axillary or inguinal regions LUNGS: clear to auscultation b/l with normal respiratory effort HEART: regular rate & rhythm ABDOMEN:  normoactive bowel sounds , non tender, not distended. Extremity: no pedal edema PSYCH: alert & oriented x 3 with fluent speech NEURO: no focal motor/sensory deficits   LABORATORY DATA:  I have reviewed the data as listed  .    Latest Ref Rng & Units 03/18/2023   11:14 AM 03/11/2022    8:57 AM 01/06/2022    9:37 AM  CBC  WBC 4.0 - 10.5 K/uL 5.9  6.7  6.7   Hemoglobin 12.0 - 15.0 g/dL 16.1  09.6  04.5   Hematocrit 36.0 - 46.0 % 37.4  38.3  40.4   Platelets 150 - 400 K/uL 144  133  154    .    Latest Ref Rng & Units 03/18/2023   11:14 AM 03/11/2022    8:57 AM 01/06/2022    9:37 AM  CMP  Glucose 70 - 99 mg/dL 84  409  811   BUN 8 - 23 mg/dL 16  12  11    Creatinine 0.44 - 1.00 mg/dL 9.14  7.82  9.56   Sodium 135 - 145 mmol/L 141  139  142   Potassium 3.5 - 5.1 mmol/L 3.9  4.0  4.0   Chloride 98 - 111 mmol/L 103  103  102   CO2 22 - 32 mmol/L 33  31  25   Calcium 8.9 - 10.3 mg/dL 8.7  8.7  9.1   Total Protein 6.5 - 8.1 g/dL 6.7  6.6  6.4   Total Bilirubin 0.3 - 1.2 mg/dL 0.4  0.5  0.4   Alkaline Phos 38 - 126 U/L 92  76  87   AST 15 - 41 U/L 17  18  16    ALT 0 -  44 U/L 16  18  15     . Lab Results  Component Value Date   LDH 185 03/18/2023          RADIOGRAPHIC STUDIES: I have personally reviewed the radiological images as listed and agreed with the findings in the report. No results found.   PET Lymphoma Hodgkins (with/ low dose CT) , 07/22/17 IMPRESSION: 1. Mildly hypermetabolic bilateral pulmonary airspace opacities that are favored  to represent infection versus complications of stem cell transplant. Follow-up with diagnostic CT may be helpful to distinguish. 2. No FDG avid malignant disease at present. Ancillary CT findings as above.  04/14/18 CT C/A/P and NECK:        10/13/18 CT C/A/P and Neck:     04/13/2019   04/13/2019   ASSESSMENT & PLAN:   67 y.o. 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   PET/CT scan shows bulky hypermetabolic lymphadenopathy throughout the neck, chest, and abdomen. 1 cm hypermetabolic pelvic lymph node in right external iliac chain. Hypermetabolic masslike opacity in central right lung with multiple adjacent hypermetabolic nodules. This is suspicious for pulmonary involvement by lymphoma  Fatigue anorexia or night sweats-drenching, and weight loss suggest constitutional type B symptoms.  Hepatitis profile and HIV neg  Patient is s/p 5 cycles of CHOEP tolerated well overall and received CHOP for 6th cycle due to significant cytopenias after 5th cycle of CHOEP. PET/CT on 01/08/2017 - showed near completely resolved LNadenopathy.  She received her autologous stem cell transplant with Dr Annia Belt at [facility] on 04/13/2017 which she has tolerated well and has recovered well with resolved cytopenias.  -PET scan on 07/22/2017 with results of: No FDG avid malignant disease at present.  04/14/18 CT C/A/P and Neck revealed No evidence of disease return of progression    #2 history of PVCs -On chronic beta blocker therapy.  #3 history of sleep apnea -continue CPAP use   PLAN:  -Discussed lab results on 03/18/2023 in detail with patient. CBC normal, showed WBC of 5.9K, hemoglobin of 13.2, and platelets improved to 144K. -LDH is wnl at 185 -CMP stable -Patient has no lab or clinical evidence of progression/recurrence of her anaplastic T-cell lymphoma at this time -She is now about nearly 6  years from her transplant and has been and continues to be in remission. -advised patient to stay UTD with her age-appropriate vaccinations including COVID-19, influenza, and RSV  -informed patient that she is eligible for post-transplant COVID-19 vaccination -continue to follow with Survivorship clinic at [facility]. Her next visit is September of next year and she shall then continue to follow up every two years.  -continue to follow with PCP regularly   FOLLOW UP: RTC with Dr Candise Che with labs in 12 months  The total time spent in the appointment was 20 minutes* .  All of the patient's questions were answered with apparent satisfaction. The patient knows to call the clinic with any problems, questions or concerns.   Wyvonnia Lora MD Denise AAHIVMS [facility] Hematology/Oncology Physician [facility]  .*Total Encounter Time as defined by the Centers for Medicare and Medicaid Services includes, in addition to the face-to-face time of a patient visit (documented in the note above) non-face-to-face time: obtaining and reviewing outside history, ordering and reviewing medications, tests or procedures, care coordination (communications with other health care professionals or caregivers) and documentation in the medical record.  I,Mitra Faeizi,acting as a Neurosurgeon for Wyvonnia Lora, MD.,have documented all relevant documentation on the behalf of Wyvonnia Lora, MD,as directed by  Wyvonnia Lora, MD while in the presence of Wyvonnia Lora, MD.  .I have reviewed the above documentation for accuracy and completeness, and I agree with the above. Johney Maine MD

## 2023-03-29 ENCOUNTER — Telehealth: Payer: Self-pay | Admitting: Cardiovascular Disease

## 2023-03-29 NOTE — Telephone Encounter (Signed)
Patient wants a call back to discuss if weight loss pills (such as Wegovy) are safe for her to take.

## 2023-03-29 NOTE — Telephone Encounter (Signed)
Patient is aware per Dr. Henry Russel will be safe to take.

## 2023-03-29 NOTE — Telephone Encounter (Signed)
Left voicemail to return call to office.

## 2023-03-29 NOTE — Telephone Encounter (Signed)
Patient returned RN's call. 

## 2023-03-29 NOTE — Telephone Encounter (Signed)
Patient stated she would like to start wegovy and would like to know what you think and to make sure wegovy will not interact with her cardiac medications.

## 2023-03-31 ENCOUNTER — Encounter: Payer: Self-pay | Admitting: Family Medicine

## 2023-03-31 ENCOUNTER — Ambulatory Visit (INDEPENDENT_AMBULATORY_CARE_PROVIDER_SITE_OTHER): Payer: Medicare HMO | Admitting: Family Medicine

## 2023-03-31 VITALS — BP 128/64 | HR 53 | Temp 97.7°F | Resp 16 | Ht 63.0 in | Wt 197.0 lb

## 2023-03-31 DIAGNOSIS — E119 Type 2 diabetes mellitus without complications: Secondary | ICD-10-CM | POA: Diagnosis not present

## 2023-03-31 DIAGNOSIS — E669 Obesity, unspecified: Secondary | ICD-10-CM

## 2023-03-31 MED ORDER — MOUNJARO 2.5 MG/0.5ML ~~LOC~~ SOAJ
2.5000 mg | SUBCUTANEOUS | 1 refills | Status: DC
Start: 2023-03-31 — End: 2023-04-05

## 2023-03-31 NOTE — Patient Instructions (Signed)
Call me in 1 month and let me know how you are doing.

## 2023-03-31 NOTE — Progress Notes (Signed)
   Subjective:    Patient ID: Denise [name], female    DOB: 29-Jan-1956, 67 y.o.   MRN: [id number]  HPI Here for consult concerning weight loss and the use of GLP-1.  Her history reveals the fact that she has had hemoglobin A1c numbers in the 6 range and as high as 6.7.  In the past since her numbers were so low and she had had difficulty with being on steroids for her underlying medical issues we did not push the issue on this.  She has been working out and made some dietary changes but has not seen a lot of progress.  A1c on June 12 was 6.7.   Review of Systems     Objective:    Physical Exam Alert and in no distress otherwise not examined       Assessment & Plan:  New onset type 2 diabetes mellitus (HCC) - Plan: tirzepatide (MOUNJARO) 2.5 MG/0.5ML Pen  Obesity (BMI 30-39.9) I discussed the fact that in the past she had been borderline with a couple of A1c's above the 6.5 range but in general below that and so treatment with an antidiabetic medication was not high on the list.  We now have an opportunity help with the diabetes, weight and potentially other issues including OSA. I will place her on Mounjaro, discussed possible side effects of medication.  She is to call me in 1 month to let me know how she is doing.  Recheck here in 2 months to get a weight and any potential side effects.  She is comfortable with that.

## 2023-04-05 ENCOUNTER — Telehealth: Payer: Self-pay | Admitting: Family Medicine

## 2023-04-05 ENCOUNTER — Other Ambulatory Visit (HOSPITAL_COMMUNITY): Payer: Self-pay

## 2023-04-05 DIAGNOSIS — E119 Type 2 diabetes mellitus without complications: Secondary | ICD-10-CM

## 2023-04-05 MED ORDER — MOUNJARO 2.5 MG/0.5ML ~~LOC~~ SOAJ
2.5000 mg | SUBCUTANEOUS | 1 refills | Status: DC
Start: 2023-04-05 — End: 2023-05-18
  Filled 2023-04-05: qty 2, 28d supply, fill #0
  Filled 2023-04-27 (×2): qty 2, 28d supply, fill #1

## 2023-04-05 NOTE — Telephone Encounter (Signed)
Pt needs Mounjaro sent to [facility], her regular pharmacy does not have

## 2023-04-06 ENCOUNTER — Other Ambulatory Visit (HOSPITAL_COMMUNITY): Payer: Self-pay

## 2023-04-14 ENCOUNTER — Other Ambulatory Visit: Payer: Self-pay | Admitting: Family Medicine

## 2023-04-14 DIAGNOSIS — Z638 Other specified problems related to primary support group: Secondary | ICD-10-CM

## 2023-04-14 DIAGNOSIS — I10 Essential (primary) hypertension: Secondary | ICD-10-CM

## 2023-04-19 ENCOUNTER — Other Ambulatory Visit: Payer: Self-pay | Admitting: Family Medicine

## 2023-04-19 ENCOUNTER — Other Ambulatory Visit: Payer: Self-pay | Admitting: Podiatry

## 2023-04-20 ENCOUNTER — Telehealth: Payer: Self-pay | Admitting: Family Medicine

## 2023-04-20 NOTE — Telephone Encounter (Signed)
Left message for pt per Dr Susann Givens that she can take the [facility] with [facility]

## 2023-04-20 NOTE — Telephone Encounter (Signed)
Patient called re Greggory Keen  she is having a lot of nausea and wants to know if it is ok to take the Ondansetron  with the [facility]

## 2023-04-27 ENCOUNTER — Other Ambulatory Visit (HOSPITAL_COMMUNITY): Payer: Self-pay

## 2023-04-29 DIAGNOSIS — Z1231 Encounter for screening mammogram for malignant neoplasm of breast: Secondary | ICD-10-CM | POA: Diagnosis not present

## 2023-04-29 LAB — HM MAMMOGRAPHY

## 2023-05-11 ENCOUNTER — Encounter: Payer: Self-pay | Admitting: Family Medicine

## 2023-05-15 ENCOUNTER — Other Ambulatory Visit: Payer: Self-pay | Admitting: Family Medicine

## 2023-05-15 DIAGNOSIS — I10 Essential (primary) hypertension: Secondary | ICD-10-CM

## 2023-05-17 ENCOUNTER — Telehealth: Payer: Self-pay

## 2023-05-17 ENCOUNTER — Other Ambulatory Visit: Payer: Self-pay

## 2023-05-17 ENCOUNTER — Other Ambulatory Visit: Payer: Self-pay | Admitting: Family Medicine

## 2023-05-17 DIAGNOSIS — I10 Essential (primary) hypertension: Secondary | ICD-10-CM

## 2023-05-17 MED ORDER — HYDROCHLOROTHIAZIDE 12.5 MG PO CAPS
12.5000 mg | ORAL_CAPSULE | Freq: Every day | ORAL | 0 refills | Status: DC
Start: 2023-05-17 — End: 2023-06-14

## 2023-05-17 NOTE — Telephone Encounter (Signed)
Update on weight loss meds: Pt has lost 8 pounds. Experiencing nausea and constipation.

## 2023-05-18 MED ORDER — TIRZEPATIDE 5 MG/0.5ML ~~LOC~~ SOAJ: 5.0000 mg | SUBCUTANEOUS | 0 refills | Status: DC

## 2023-05-24 ENCOUNTER — Other Ambulatory Visit (HOSPITAL_COMMUNITY): Payer: Self-pay

## 2023-05-24 ENCOUNTER — Other Ambulatory Visit: Payer: Self-pay | Admitting: Family Medicine

## 2023-05-24 DIAGNOSIS — E119 Type 2 diabetes mellitus without complications: Secondary | ICD-10-CM

## 2023-05-25 ENCOUNTER — Other Ambulatory Visit (HOSPITAL_COMMUNITY): Payer: Self-pay

## 2023-05-25 ENCOUNTER — Other Ambulatory Visit: Payer: Self-pay | Admitting: Family Medicine

## 2023-05-25 DIAGNOSIS — E119 Type 2 diabetes mellitus without complications: Secondary | ICD-10-CM

## 2023-05-26 ENCOUNTER — Encounter (HOSPITAL_COMMUNITY): Payer: Self-pay

## 2023-05-26 ENCOUNTER — Other Ambulatory Visit (HOSPITAL_COMMUNITY): Payer: Self-pay

## 2023-05-27 ENCOUNTER — Telehealth: Payer: Self-pay | Admitting: Family Medicine

## 2023-05-27 ENCOUNTER — Other Ambulatory Visit (HOSPITAL_COMMUNITY): Payer: Self-pay

## 2023-05-27 MED ORDER — TIRZEPATIDE 5 MG/0.5ML ~~LOC~~ SOAJ
5.0000 mg | SUBCUTANEOUS | 0 refills | Status: DC
Start: 2023-05-27 — End: 2023-06-17
  Filled 2023-05-27: qty 2, 28d supply, fill #0

## 2023-05-27 NOTE — Telephone Encounter (Signed)
Patient called re mounjaro rx, she states that rx has denied twice at [facility] ( She has to get this med from [facility], can't get at [facility])  She has 1 injection left, she is still on same dose  She states nausea has gotten better but still having constipation  Please call patient and advise rx sent, etc

## 2023-06-01 ENCOUNTER — Ambulatory Visit: Payer: Medicare HMO | Admitting: Family Medicine

## 2023-06-03 DIAGNOSIS — H2513 Age-related nuclear cataract, bilateral: Secondary | ICD-10-CM | POA: Diagnosis not present

## 2023-06-03 DIAGNOSIS — H43393 Other vitreous opacities, bilateral: Secondary | ICD-10-CM | POA: Diagnosis not present

## 2023-06-07 ENCOUNTER — Other Ambulatory Visit: Payer: Self-pay | Admitting: Family Medicine

## 2023-06-09 ENCOUNTER — Telehealth: Payer: Self-pay | Admitting: Family Medicine

## 2023-06-09 MED ORDER — ONDANSETRON 4 MG PO TBDP: 4.0000 mg | ORAL_TABLET | Freq: Three times a day (TID) | ORAL | 0 refills | Status: DC | PRN

## 2023-06-09 NOTE — Telephone Encounter (Signed)
Pt called re denial of ondansetron  When I spoke with her previously she was taking this for some nausea she was having from the [facility]

## 2023-06-14 ENCOUNTER — Other Ambulatory Visit: Payer: Self-pay | Admitting: Family Medicine

## 2023-06-14 DIAGNOSIS — I10 Essential (primary) hypertension: Secondary | ICD-10-CM

## 2023-06-14 NOTE — Telephone Encounter (Signed)
Called pt to see if she needs before 11/7 appt.

## 2023-06-15 ENCOUNTER — Ambulatory Visit: Payer: Medicare HMO | Admitting: Family Medicine

## 2023-06-17 ENCOUNTER — Ambulatory Visit: Payer: Medicare HMO | Admitting: Family Medicine

## 2023-06-17 ENCOUNTER — Encounter: Payer: Self-pay | Admitting: Family Medicine

## 2023-06-17 VITALS — BP 122/76 | HR 71 | Wt 185.2 lb

## 2023-06-17 DIAGNOSIS — E669 Obesity, unspecified: Secondary | ICD-10-CM

## 2023-06-17 DIAGNOSIS — E119 Type 2 diabetes mellitus without complications: Secondary | ICD-10-CM

## 2023-06-17 MED ORDER — TIRZEPATIDE 5 MG/0.5ML ~~LOC~~ SOAJ
5.0000 mg | SUBCUTANEOUS | 1 refills | Status: DC
Start: 2023-06-17 — End: 2023-06-21

## 2023-06-17 NOTE — Progress Notes (Signed)
   Subjective:    Patient ID: Denise [name], female    DOB: 1955-10-07, 67 y.o.   MRN: [id number]  HPI She is here for recheck on the recent diagnosis of diabetes.  She was started on Mounjaro 2.5 and is now on 5.  She does initially have some slight malaise with the first day or 2 of the medicine but after that seems to doing well.  No nausea vomiting, abdominal distress.  Review of the record indicates she is lost roughly 12 pounds.   Review of Systems     Objective:    Physical Exam Alert and in no distress otherwise not examined       Assessment & Plan:  New onset type 2 diabetes mellitus (HCC) - Plan: tirzepatide (MOUNJARO) 5 MG/0.5ML Pen  Obesity (BMI 30-39.9)  I discussed the use of Mounjaro and the fact that we will keep her on the 5 mg for now and see how much more weight she loses.  She is to check her weight roughly once a week and if we see a steady state between now and when I see her again, I can increase her medication.  She was comfortable with that.  Explained the fact that we will not stop taking the [facility] ever however it is a matter of what dosing regimen will be the best long-term which at this point is hard to say.

## 2023-06-21 ENCOUNTER — Other Ambulatory Visit (HOSPITAL_COMMUNITY): Payer: Self-pay

## 2023-06-21 ENCOUNTER — Other Ambulatory Visit: Payer: Self-pay

## 2023-06-21 ENCOUNTER — Telehealth: Payer: Self-pay | Admitting: Family Medicine

## 2023-06-21 DIAGNOSIS — E119 Type 2 diabetes mellitus without complications: Secondary | ICD-10-CM

## 2023-06-21 MED ORDER — TIRZEPATIDE 5 MG/0.5ML ~~LOC~~ SOAJ
5.0000 mg | SUBCUTANEOUS | 1 refills | Status: DC
  Filled 2023-06-21: qty 6, 84d supply, fill #0

## 2023-06-21 NOTE — Telephone Encounter (Signed)
Pt called and needs her mounjaro switched to the [place] [facility] is out of stock

## 2023-07-12 ENCOUNTER — Other Ambulatory Visit: Payer: Self-pay | Admitting: Family Medicine

## 2023-07-12 DIAGNOSIS — I10 Essential (primary) hypertension: Secondary | ICD-10-CM

## 2023-07-14 ENCOUNTER — Ambulatory Visit (INDEPENDENT_AMBULATORY_CARE_PROVIDER_SITE_OTHER): Payer: Medicare HMO | Admitting: Podiatry

## 2023-07-14 ENCOUNTER — Encounter: Payer: Self-pay | Admitting: Podiatry

## 2023-07-14 DIAGNOSIS — M7751 Other enthesopathy of right foot: Secondary | ICD-10-CM

## 2023-07-14 DIAGNOSIS — M7752 Other enthesopathy of left foot: Secondary | ICD-10-CM

## 2023-07-14 NOTE — Progress Notes (Signed)
Subjective:  Patient ID: Denise [name], female    DOB: Sep 22, 1955,  MRN: [id number]  Chief Complaint  Patient presents with   Routine Post Op    PATIENT STATES SHE IS JUST HERE TO GET HER INJECTIONS FOR HER BILATERAL FEET , PATIENT STATES SHE GETS THEM EVERY 6 MONTHS.    67 y.o. female presents with the above complaint.  Patient presents with complaint of bilateral second metatarsophalangeal joint pain.  She states that the injection helped.  She would like to do another injection.  The injection does not last longer.   Review of Systems: Negative except as noted in the HPI. Denies N/V/F/Ch.  Past Medical History:  Diagnosis Date   Allergy    Asthma    Bursitis of left hip    Fibroids    TAH in 1999   GERD (gastroesophageal reflux disease)    Lymphoma (HCC)    T cell lymphoma   PVC's (premature ventricular contractions)    Sleep apnea    does not use CPAP    Current Outpatient Medications:    albuterol (VENTOLIN HFA) 108 (90 Base) MCG/ACT inhaler, Inhale 2 puffs into the lungs every 6 (six) hours as needed for wheezing or shortness of breath., Disp: 8.5 g, Rfl: 0   ALPRAZolam (XANAX) 0.25 MG tablet, TAKE 1 TABLET BY MOUTH TWICE DAILY AS NEEDED, Disp: 50 tablet, Rfl: 0   aspirin 81 MG chewable tablet, Chew 81 mg by mouth daily as needed for moderate pain (pain score 4-6)., Disp: , Rfl:    atenolol (TENORMIN) 50 MG tablet, Take 1 tablet (50 mg total) by mouth 2 (two) times daily., Disp: 180 tablet, Rfl: 1   atorvastatin (LIPITOR) 20 MG tablet, TAKE 1 TABLET BY MOUTH DAILY, Disp: 90 tablet, Rfl: 3   fluticasone (FLONASE) 50 MCG/ACT nasal spray, Place into the nose., Disp: , Rfl:    fluticasone-salmeterol (ADVAIR) 100-50 MCG/ACT AEPB, INHALE 1 PUFF INTO THE LUNGS TWICE DAILY, Disp: 60 each, Rfl: 5   hydrochlorothiazide (MICROZIDE) 12.5 MG capsule, TAKE 1 CAPSULE BY MOUTH DAILY, Disp: 30 capsule, Rfl: 1   IBU 800 MG tablet, TAKE 1 TABLET BY MOUTH EVERY 6 HOURS AS NEEDED, Disp:  60 tablet, Rfl: 1   ondansetron (ZOFRAN-ODT) 4 MG disintegrating tablet, TAKE 1 TABLET BY MOUTH EVERY 8 HOURS AS NEEDED, Disp: 12 tablet, Rfl: 0   Probiotic Product (PROBIOTIC DAILY PO), Take 1 capsule by mouth daily., Disp: , Rfl:    tirzepatide (MOUNJARO) 5 MG/0.5ML Pen, Inject 5 mg into the skin once a week., Disp: 6 mL, Rfl: 1   Wheat Dextrin (EQ FIBER POWDER PO), Take by mouth., Disp: , Rfl:   Social History   Tobacco Use  Smoking Status Never  Smokeless Tobacco Never    Allergies  Allergen Reactions   Morphine And Codeine    Objective:  There were no vitals filed for this visit. There is no height or weight on file to calculate BMI. Constitutional Well developed. Well nourished.  Vascular Dorsalis pedis pulses palpable bilaterally. Posterior tibial pulses palpable bilaterally. Capillary refill normal to all digits.  No cyanosis or clubbing noted. Pedal hair growth normal.  Neurologic Normal speech. Oriented to person, place, and time. Epicritic sensation to light touch grossly present bilaterally.  Dermatologic Nails well groomed and normal in appearance. No open wounds. No skin lesions.  Orthopedic:  Pain on palpation to the range of motion of bilateral second metatarsophalangeal joint.  No deep intra-articular pain noted.  No plantar  plate involvement noted.  Negative Mulder's click noted.  No signs of neuroma noted.   Radiographs: 3 views of skeletally mature adult bilateral foot severe osteoarthritic changes noted of the first and second metatarsal phalangeal joint.  Medial deviation of the digits noted.  History of prior surgery.  Cavovarus foot structure noted bilaterally.  Severe pes planovalgus noted.  Midfoot arthritis noted.  No other bony abnormalities identified.  Assessment:   No diagnosis found.      Plan:  Patient was evaluated and treated and all questions answered.  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  Given the amount of pain that she is having I believe she will benefit from a steroid injection of decrease acute inflammatory component associated pain.  She states understanding like to proceed with a steroid injection. -Another steroid injection was performed at second metatarsophalangeal joint bilateral using 1% plain Lidocaine and 10 mg of Kenalog. This was well tolerated.   No follow-ups on file.

## 2023-08-17 ENCOUNTER — Encounter: Payer: Self-pay | Admitting: Family Medicine

## 2023-08-17 ENCOUNTER — Telehealth: Payer: Medicare HMO | Admitting: Family Medicine

## 2023-08-17 VITALS — Ht 63.5 in | Wt 171.0 lb

## 2023-08-17 DIAGNOSIS — J069 Acute upper respiratory infection, unspecified: Secondary | ICD-10-CM

## 2023-08-17 NOTE — Progress Notes (Signed)
   Subjective:    Patient ID: Denise [name], female    DOB: 1956-07-07, 68 y.o.   MRN: [id number]  HPI Documentation for virtual audio and video telecommunications through Caregility encounter: The patient was located at home. 2 patient identifiers used.  The provider was located in the office. The patient did consent to this visit and is aware of possible charges through their insurance for this visit. The other persons participating in this telemedicine service were none. Time spent on call was 5 minutes and in review of previous records >15 minutes total for counseling and coordination of care.  This virtual service is not related to other E/M service within previous 7 days.  She states that on Saturday she had sneezing, sinus congestion, rhinorrhea and slight cough but no fever, chills, sore throat or earache.  She has been using NyQuil and occasionally using ibuprofen .  Review of Systems     Objective:    Physical Exam Alert and in no distress otherwise not examined       Assessment & Plan:  Viral URI with cough Recommend continuing with NyQuil at night as well as ibuprofen  but can also use Tylenol  sinus and Afrin nasal spray but only at night.  Explained that this should go away in roughly a week but if she gets worse she should call.  She was comfortable with that.

## 2023-08-23 ENCOUNTER — Other Ambulatory Visit: Payer: Self-pay | Admitting: Family Medicine

## 2023-08-23 DIAGNOSIS — Z638 Other specified problems related to primary support group: Secondary | ICD-10-CM

## 2023-08-23 DIAGNOSIS — J4530 Mild persistent asthma, uncomplicated: Secondary | ICD-10-CM

## 2023-08-24 NOTE — Telephone Encounter (Signed)
Is ths okay to refill?

## 2023-08-25 ENCOUNTER — Telehealth: Payer: Self-pay | Admitting: Family Medicine

## 2023-08-25 DIAGNOSIS — J069 Acute upper respiratory infection, unspecified: Secondary | ICD-10-CM

## 2023-08-25 MED ORDER — AZITHROMYCIN 500 MG PO TABS: 500.0000 mg | ORAL_TABLET | Freq: Every day | ORAL | 0 refills | Status: DC

## 2023-08-25 NOTE — Telephone Encounter (Signed)
 Pt called, she is still sick and can't get rid of this stuff, cough/chest   she is taking mucinex  Wants rx antibiotic  Please call pt

## 2023-09-07 ENCOUNTER — Telehealth: Payer: Self-pay

## 2023-09-07 MED ORDER — TIRZEPATIDE 10 MG/0.5ML ~~LOC~~ SOAJ
10.0000 mg | SUBCUTANEOUS | 0 refills | Status: DC
  Filled 2023-09-08: qty 2, 28d supply, fill #0

## 2023-09-07 NOTE — Telephone Encounter (Signed)
Pt called stating she has not loss any weight on her current dose of mounjaro 5mg . Requested to increase dose. Last appt. 06/17/23. Next appt. 09/21/23. Send to [place] please.

## 2023-09-08 ENCOUNTER — Other Ambulatory Visit (HOSPITAL_COMMUNITY): Payer: Self-pay

## 2023-09-08 ENCOUNTER — Other Ambulatory Visit: Payer: Self-pay

## 2023-09-08 NOTE — Telephone Encounter (Signed)
Called pt to notify, no answer, left voicemail.

## 2023-09-08 NOTE — Telephone Encounter (Signed)
Notified pt.

## 2023-09-10 ENCOUNTER — Other Ambulatory Visit (HOSPITAL_COMMUNITY): Payer: Self-pay

## 2023-09-13 ENCOUNTER — Other Ambulatory Visit: Payer: Self-pay | Admitting: Family Medicine

## 2023-09-13 DIAGNOSIS — I10 Essential (primary) hypertension: Secondary | ICD-10-CM

## 2023-09-13 DIAGNOSIS — J4530 Mild persistent asthma, uncomplicated: Secondary | ICD-10-CM

## 2023-09-14 ENCOUNTER — Other Ambulatory Visit (HOSPITAL_COMMUNITY): Payer: Self-pay

## 2023-09-21 ENCOUNTER — Encounter: Payer: Self-pay | Admitting: Family Medicine

## 2023-09-21 ENCOUNTER — Ambulatory Visit (INDEPENDENT_AMBULATORY_CARE_PROVIDER_SITE_OTHER): Payer: Medicare HMO | Admitting: Family Medicine

## 2023-09-21 VITALS — BP 130/70 | HR 69 | Ht 63.0 in | Wt 171.6 lb

## 2023-09-21 DIAGNOSIS — E1165 Type 2 diabetes mellitus with hyperglycemia: Secondary | ICD-10-CM | POA: Diagnosis not present

## 2023-09-21 DIAGNOSIS — G4730 Sleep apnea, unspecified: Secondary | ICD-10-CM

## 2023-09-21 DIAGNOSIS — E669 Obesity, unspecified: Secondary | ICD-10-CM

## 2023-09-21 DIAGNOSIS — I10 Essential (primary) hypertension: Secondary | ICD-10-CM

## 2023-09-21 DIAGNOSIS — Z23 Encounter for immunization: Secondary | ICD-10-CM | POA: Diagnosis not present

## 2023-09-21 DIAGNOSIS — E119 Type 2 diabetes mellitus without complications: Secondary | ICD-10-CM

## 2023-09-21 LAB — POCT GLYCOSYLATED HEMOGLOBIN (HGB A1C): Hemoglobin A1C: 5.9 % — AB (ref 4.0–5.6)

## 2023-09-21 MED ORDER — TIRZEPATIDE 5 MG/0.5ML ~~LOC~~ SOAJ: 5.0000 mg | SUBCUTANEOUS | 1 refills | Status: DC

## 2023-09-21 NOTE — Progress Notes (Signed)
   Subjective:    Patient ID: Denise [name], female    DOB: 1956-02-03, 68 y.o.   MRN: [id number]  HPI She is here for a recheck on her diabetes.  She was taking Mounjaro at 5 and then bumped up to 10 mg however only took 1 dose of that because of unacceptable side effects.  She did state that the 5 mg dosing did cut down on food noise as well as making her less hungry.  No nausea, vomiting, other difficulties.  She also has a history of OSA but has not been using CPAP stating it is too uncomfortable for her.  Apparently she has not been snoring recently either. Review of Systems     Objective:    Physical Exam Alert and in no distress.  Hemoglobin A1c is 5.9.  Review of record indicates she has lost roughly 26 pounds.       Assessment & Plan:  Type 2 diabetes mellitus with hyperglycemia, without long-term current use of insulin (HCC) - Plan: POCT glycosylated hemoglobin (Hb A1C)  Obesity (BMI 30-39.9)  Sleep apnea, unspecified type  Essential hypertension  Need for vaccination against Streptococcus pneumoniae Congratulated her on the work that she has done.  Will continue her on the 5 mg dosing and recheck here in approximately 4 months.

## 2023-09-22 ENCOUNTER — Other Ambulatory Visit (HOSPITAL_COMMUNITY): Payer: Self-pay

## 2023-09-22 ENCOUNTER — Other Ambulatory Visit: Payer: Self-pay | Admitting: Family Medicine

## 2023-09-22 DIAGNOSIS — E119 Type 2 diabetes mellitus without complications: Secondary | ICD-10-CM

## 2023-09-22 MED ORDER — MOUNJARO 5 MG/0.5ML ~~LOC~~ SOAJ
5.0000 mg | SUBCUTANEOUS | 1 refills | Status: DC
Start: 2023-09-22 — End: 2023-10-26
  Filled 2023-09-22: qty 6, 84d supply, fill #0

## 2023-09-23 ENCOUNTER — Other Ambulatory Visit (HOSPITAL_COMMUNITY): Payer: Self-pay

## 2023-09-23 DIAGNOSIS — H2513 Age-related nuclear cataract, bilateral: Secondary | ICD-10-CM | POA: Diagnosis not present

## 2023-09-23 DIAGNOSIS — H18413 Arcus senilis, bilateral: Secondary | ICD-10-CM | POA: Diagnosis not present

## 2023-09-23 DIAGNOSIS — H353131 Nonexudative age-related macular degeneration, bilateral, early dry stage: Secondary | ICD-10-CM | POA: Diagnosis not present

## 2023-09-23 DIAGNOSIS — H25043 Posterior subcapsular polar age-related cataract, bilateral: Secondary | ICD-10-CM | POA: Diagnosis not present

## 2023-09-23 DIAGNOSIS — H2511 Age-related nuclear cataract, right eye: Secondary | ICD-10-CM | POA: Diagnosis not present

## 2023-10-05 ENCOUNTER — Encounter: Payer: Self-pay | Admitting: Internal Medicine

## 2023-10-11 ENCOUNTER — Other Ambulatory Visit: Payer: Self-pay | Admitting: Family Medicine

## 2023-10-11 DIAGNOSIS — J4530 Mild persistent asthma, uncomplicated: Secondary | ICD-10-CM

## 2023-10-11 DIAGNOSIS — I10 Essential (primary) hypertension: Secondary | ICD-10-CM

## 2023-10-12 ENCOUNTER — Other Ambulatory Visit: Payer: Self-pay | Admitting: Family Medicine

## 2023-10-12 ENCOUNTER — Telehealth: Payer: Self-pay

## 2023-10-12 ENCOUNTER — Other Ambulatory Visit (HOSPITAL_COMMUNITY): Payer: Self-pay

## 2023-10-12 MED ORDER — ONDANSETRON 4 MG PO TBDP: 4.0000 mg | ORAL_TABLET | Freq: Three times a day (TID) | ORAL | 0 refills | Status: DC | PRN

## 2023-10-12 NOTE — Telephone Encounter (Signed)
 Copied from CRM [id number]. Topic: Clinical - Medication Refill >> Oct 12, 2023  1:39 PM Higinio Roger wrote: Most Recent Primary Care Visit:  Provider: Ronnald Nian  Department: [facility]  Visit Type: FOLLOW UP 30  Date: 09/21/2023  Medication: ondansetron (ZOFRAN-ODT) 4 MG disintegrating tablet   Has the patient contacted their pharmacy? Yes (Agent: If no, request that the patient contact the pharmacy for the refill. If patient does not wish to contact the pharmacy document the reason why and proceed with request.) (Agent: If yes, when and what did the pharmacy advise?) Pharmacy stated it was denied  Is this the correct pharmacy for this prescription? Yes If no, delete pharmacy and type the correct one.  This is the patient's preferred pharmacy:   [facility] - [place], [place] - [address] [place] [address] Phone: [phone] Fax: [phone]   Has the prescription been filled recently? Yes  Is the patient out of the medication? Yes  Has the patient been seen for an appointment in the last year OR does the patient have an upcoming appointment? Yes  Can we respond through MyChart? Yes  Agent: Please be advised that Rx refills may take up to 3 business days. We ask that you follow-up with your pharmacy.

## 2023-10-12 NOTE — Telephone Encounter (Signed)
 Pharmacy Patient Advocate Encounter   Received notification from CoverMyMeds that prior authorization for Fluticasone-Salmeterol 100-50MCG/ACT aerosol powder  is required/requested.   Insurance verification completed.   The patient is insured through [facility] .   Per test claim: PA required; PA submitted to above mentioned insurance via CoverMyMeds Key/confirmation #/EOC (Key: [id number])     Status is pending

## 2023-10-12 NOTE — Telephone Encounter (Signed)
 Last Fill: 08/24/23 12 tabs/0 RF  Last OV: 09/21/23 Next OV: 01/25/24  Routing to provider for review/authorization.

## 2023-10-14 ENCOUNTER — Other Ambulatory Visit (HOSPITAL_COMMUNITY): Payer: Self-pay

## 2023-10-14 NOTE — Telephone Encounter (Signed)
 I have ran an additional test claim and No Prior Authorization is needed as of now. The patient has picked up the Rx as of 3.4.25 and is currently A RTS

## 2023-10-18 ENCOUNTER — Other Ambulatory Visit: Payer: Self-pay | Admitting: *Deleted

## 2023-10-18 DIAGNOSIS — C8478 Anaplastic large cell lymphoma, ALK-negative, lymph nodes of multiple sites: Secondary | ICD-10-CM

## 2023-10-19 ENCOUNTER — Inpatient Hospital Stay: Payer: BC Managed Care – PPO | Attending: Internal Medicine

## 2023-10-19 ENCOUNTER — Inpatient Hospital Stay (HOSPITAL_BASED_OUTPATIENT_CLINIC_OR_DEPARTMENT_OTHER): Payer: BC Managed Care – PPO | Admitting: Hematology

## 2023-10-19 VITALS — BP 115/85 | HR 57 | Temp 97.6°F | Resp 16 | Ht 63.0 in | Wt 171.5 lb

## 2023-10-19 DIAGNOSIS — G4730 Sleep apnea, unspecified: Secondary | ICD-10-CM | POA: Diagnosis not present

## 2023-10-19 DIAGNOSIS — Z79899 Other long term (current) drug therapy: Secondary | ICD-10-CM | POA: Insufficient documentation

## 2023-10-19 DIAGNOSIS — Z8572 Personal history of non-Hodgkin lymphomas: Secondary | ICD-10-CM | POA: Diagnosis not present

## 2023-10-19 DIAGNOSIS — Z8679 Personal history of other diseases of the circulatory system: Secondary | ICD-10-CM | POA: Insufficient documentation

## 2023-10-19 DIAGNOSIS — C8478 Anaplastic large cell lymphoma, ALK-negative, lymph nodes of multiple sites: Secondary | ICD-10-CM

## 2023-10-19 DIAGNOSIS — Z9484 Stem cells transplant status: Secondary | ICD-10-CM | POA: Diagnosis not present

## 2023-10-19 LAB — CBC WITH DIFFERENTIAL (CANCER CENTER ONLY)
Abs Immature Granulocytes: 0.02 10*3/uL (ref 0.00–0.07)
Basophils Absolute: 0 10*3/uL (ref 0.0–0.1)
Basophils Relative: 1 %
Eosinophils Absolute: 0.1 10*3/uL (ref 0.0–0.5)
Eosinophils Relative: 1 %
HCT: 39.4 % (ref 36.0–46.0)
Hemoglobin: 13.4 g/dL (ref 12.0–15.0)
Immature Granulocytes: 0 %
Lymphocytes Relative: 25 %
Lymphs Abs: 1.4 10*3/uL (ref 0.7–4.0)
MCH: 29.5 pg (ref 26.0–34.0)
MCHC: 34 g/dL (ref 30.0–36.0)
MCV: 86.8 fL (ref 80.0–100.0)
Monocytes Absolute: 0.4 10*3/uL (ref 0.1–1.0)
Monocytes Relative: 8 %
Neutro Abs: 3.7 10*3/uL (ref 1.7–7.7)
Neutrophils Relative %: 65 %
Platelet Count: 146 10*3/uL — ABNORMAL LOW (ref 150–400)
RBC: 4.54 MIL/uL (ref 3.87–5.11)
RDW: 12.6 % (ref 11.5–15.5)
WBC Count: 5.7 10*3/uL (ref 4.0–10.5)
nRBC: 0 % (ref 0.0–0.2)

## 2023-10-19 LAB — CMP (CANCER CENTER ONLY)
ALT: 15 U/L (ref 0–44)
AST: 18 U/L (ref 15–41)
Albumin: 4 g/dL (ref 3.5–5.0)
Alkaline Phosphatase: 76 U/L (ref 38–126)
Anion gap: 6 (ref 5–15)
BUN: 10 mg/dL (ref 8–23)
CO2: 32 mmol/L (ref 22–32)
Calcium: 8.9 mg/dL (ref 8.9–10.3)
Chloride: 101 mmol/L (ref 98–111)
Creatinine: 0.7 mg/dL (ref 0.44–1.00)
GFR, Estimated: >60 mL/min (ref >60)
Glucose, Bld: 112 mg/dL — ABNORMAL HIGH (ref 70–99)
Potassium: 4 mmol/L (ref 3.5–5.1)
Sodium: 139 mmol/L (ref 135–145)
Total Bilirubin: 0.6 mg/dL (ref 0.0–1.2)
Total Protein: 6.6 g/dL (ref 6.5–8.1)

## 2023-10-19 LAB — LACTATE DEHYDROGENASE: LDH: 161 U/L (ref 98–192)

## 2023-10-19 NOTE — Progress Notes (Signed)
 HEMATOLOGY/ONCOLOGY CLINIC NOTE  Date of Service: 10/19/23   Patient Care Team: Ronnald Nian, MD as PCP - General (Family Medicine) Dr. 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 Valtrex resolved the shingles but she continued to have progressive adenopathy, cough and an unintentional weight loss. On 10/15/2016, a CT scan showed bulky adenopathy in the supraclavicular, mediastinum, hilar and abdomen. On 10/21/16, an ultrasound guided core biopsy of the left supraclavicular lymph node showed a T cell lymphoproliferative process with the differential diagnosis most suggestive of a CD30 positive PTCL, NOS or ALK negative anaplastic lymphoma. A PET scan on 10/27/16, redemonstrated bulky hypermetabolic lymphadenopathy throughout the body and a hypermetabolic mass in the central lung with multiple nodules and bilateral pleural effusions. The highest SUV was in the left para-aortic region at 21.4.    In early April 2018 , she started chemotherapy with CHOEP. Interim scans showed nearly completely resolved adenopathy, [place] 1. Completed 5 cycles with CR1 prior to transplant.  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 Hydration and BCNU 300 mg/m2 in 500 ml D5W over 2 hours today. 08/30 - Day -5.  Cytarabine 200 mg/m2 in 250 ml NS over 1 hour and etoposide 200 mg/m2 in 1 L NS over 3 hours. 08/31 - Day -4. Cytarabine 200 mg/m2 in 250 ml NS over 1 hour and etoposide 200 mg/m2 in 1 L NS over 3 hours. 09/01 - Day -3. Cytarabine 200 mg/m2 in 250 ml NS over 1 hour and etoposide 200 mg/m2 in 1 L NS over 3 hours. 09/02 - Day -2. Cytarabine 200 mg/m2 in 250 ml NS over 1 hour and etoposide 200 mg/m2 in 1 L NS over 3 hours. 09/03 - Day -1. Melphalan 140 mg/m2 in 100 ml NS over 30 minutes.    04/13/2017 Transplant  Outpatient Autologous stem cell transplant - Day 0 on 04/13/2017  ASBMT risk stratification: low risk ECOG/KPS: 1/Able to carry on normal activities; minor signs/symptoms of disease HSCT-CI score: CR1 Preparative regimen/treatment protocol: BEAM  Engraftment:  WBC on 04/23/2017 with WBC 1.8 and ANC 1.1 Platelets: Anticipated for 9/21 - last platelet transfusion on 04/23/2017   04/18/2017 Complications  Transplant complications: Cx negative neutropenic fever - admitted to inpatient service and started on broad spectrum Zosyn which she continued until WBC engraftment on 04/23/2017  Mucositis: Grade II - managed with MMW    INTERVAL HISTORY  Denise [name] is here for her scheduled follow-up for her 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   Patient was last seen by me on 03/18/2023 and she was doing well overall.   Patient is accompanied by her partner during this visit. Patient notes she has been doing well overall since our last visit. She denies any new infection issues, fever, chills, night sweats, SOB, back pain, chest pain, abdominal pain, enlarged  lymph nodes, urinary symptoms, or leg swelling. She does report mild occasional abnormal bowel movement.   She is UTD with all age-related vaccines.   Patient has lost weight with Mounjaro. She has been tolerating her current dose of Mounjaro well without any new or severe toxicities.   MEDICAL HISTORY:  Past Medical History:   Diagnosis Date   Allergy    Asthma    Bursitis of left hip    Fibroids    TAH in 1999   GERD (gastroesophageal reflux disease)    Lymphoma (HCC)    T cell lymphoma   PVC's (premature ventricular contractions)    Sleep apnea    does not use CPAP    SURGICAL HISTORY: Past Surgical History:  Procedure Laterality Date   CARDIAC CATH  05/2007   J. BERRY   HAMMER TOE SURGERY Bilateral 2016   IR GENERIC HISTORICAL  11/05/2016   IR US GUIDE VASC ACCESS RIGHT 11/05/2016 Jolaine Click, MD [facility]   IR GENERIC HISTORICAL  11/05/2016   IR FLUORO GUIDE PORT INSERTION RIGHT 11/05/2016 Jolaine Click, MD [facility]   TOTAL ABDOMINAL HYSTERECTOMY  05/15/1998   secondary to fibroids   WISDOM TOOTH EXTRACTION  mid teens    SOCIAL HISTORY: Social History   Socioeconomic History   Marital status: Significant Other    Spouse name: Not on file   Number of children: 0   Years of education: Not on file   Highest education level: Not on file  Occupational History   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 [employer]    Employer: [employer]  Tobacco Use   Smoking status: Never   Smokeless tobacco: Never  Vaping Use   Vaping status: Never Used  Substance and Sexual Activity   Alcohol use: Yes    Alcohol/week: 7.0 standard drinks of alcohol    Types: 7 Glasses of wine per week   Drug use: No   Sexual activity: Yes    Partners: Female    Birth control/protection: None  Other Topics Concern   Not on file  Social History Narrative   Not on file   Social Drivers of Health   Financial Resource Strain: Not on file  Food Insecurity: Not on file  Transportation Needs: Not on file  Physical Activity: Not on file  Stress: Not on file  Social Connections: Not on file  Intimate Partner Violence: Not on file    FAMILY HISTORY: Family History  Problem Relation Age of Onset   Cancer Maternal Grandmother        GI cancer in 66's   Dementia Mother    Heart disease Father     Heart failure Father     ALLERGIES:  is allergic to morphine and codeine.  MEDICATIONS:  Current Outpatient Medications  Medication Sig Dispense Refill   albuterol (VENTOLIN HFA) 108 (90 Base) MCG/ACT inhaler INHALE 2 PUFFS INTO THE LUNGS EVERY 6 HOURS AS NEEDED FOR WHEEZING OR SHORTNESS OF BREATH 8.5 g 0   ALPRAZolam (XANAX) 0.25 MG tablet TAKE 1 TABLET BY MOUTH TWICE DAILY AS NEEDED 50 tablet 0   aspirin 81 MG chewable tablet Chew 81 mg by mouth daily as needed for moderate pain (pain score 4-6).     atenolol (TENORMIN) 50 MG tablet Take 1 tablet (50 mg total) by mouth 2 (two) times daily. 180 tablet 1   atorvastatin (LIPITOR) 20 MG tablet TAKE 1 TABLET BY MOUTH DAILY 90 tablet 3   fluticasone (FLONASE) 50  MCG/ACT nasal spray Place into the nose.     fluticasone-salmeterol (ADVAIR) 100-50 MCG/ACT AEPB INHALE 1 PUFF INTO THE LUNGS TWICE DAILY 60 each 2   hydrochlorothiazide (MICROZIDE) 12.5 MG capsule TAKE 1 CAPSULE BY MOUTH DAILY 30 capsule 2   IBU 800 MG tablet TAKE 1 TABLET BY MOUTH EVERY 6 HOURS AS NEEDED (Patient not taking: Reported on 09/21/2023) 60 tablet 1   ondansetron (ZOFRAN-ODT) 4 MG disintegrating tablet Take 1 tablet (4 mg total) by mouth every 8 (eight) hours as needed. 12 tablet 0   Probiotic Product (PROBIOTIC DAILY PO) Take 1 capsule by mouth daily.     tirzepatide [facility]) 5 MG/0.5ML Pen Inject 5 mg into the skin once a week. 6 mL 1   tirzepatide (MOUNJARO) 5 MG/0.5ML Pen Inject 5 mg into the skin once a week. 6 mL 1   Wheat Dextrin (EQ FIBER POWDER PO) Take by mouth.     No current facility-administered medications for this visit.    REVIEW OF SYSTEMS:    10 Point review of Systems was done is negative except as noted above.   PHYSICAL EXAMINATION:  ECOG FS:0 - Asymptomatic  Vitals:   10/19/23 0921  BP: 115/85  Pulse: (!) 57  Resp: 16  Temp: 97.6 F (36.4 C)  SpO2: 100%   Wt Readings from Last 3 Encounters:  09/21/23 171 lb 9.6 oz (77.8 kg)   08/17/23 171 lb (77.6 kg)  06/17/23 185 lb 3.2 oz (84 kg)   Body mass index is 30.38 kg/m.    GENERAL:alert, in no acute distress and comfortable SKIN: no acute rashes, no significant lesions EYES: conjunctiva are pink and non-injected, sclera anicteric OROPHARYNX: MMM, no exudates, no oropharyngeal erythema or ulceration NECK: supple, no JVD LYMPH:  no palpable lymphadenopathy in the cervical, axillary or inguinal regions LUNGS: clear to auscultation b/l with normal respiratory effort HEART: regular rate & rhythm ABDOMEN:  normoactive bowel sounds , non tender, not distended. Extremity: no pedal edema PSYCH: alert & oriented x 3 with fluent speech NEURO: no focal motor/sensory deficits   LABORATORY DATA:  I have reviewed the data as listed  .    Latest Ref Rng & Units 10/19/2023    9:05 AM 03/18/2023   11:14 AM 03/11/2022    8:57 AM  CBC  WBC 4.0 - 10.5 K/uL 5.7  5.9  6.7   Hemoglobin 12.0 - 15.0 g/dL 16.1  09.6  04.5   Hematocrit 36.0 - 46.0 % 39.4  37.4  38.3   Platelets 150 - 400 K/uL 146  144  133    .    Latest Ref Rng & Units 10/19/2023    9:05 AM 03/18/2023   11:14 AM 03/11/2022    8:57 AM  CMP  Glucose 70 - 99 mg/dL 409  84  811   BUN 8 - 23 mg/dL 10  16  12    Creatinine 0.44 - 1.00 mg/dL 9.14  7.82  9.56   Sodium 135 - 145 mmol/L 139  141  139   Potassium 3.5 - 5.1 mmol/L 4.0  3.9  4.0   Chloride 98 - 111 mmol/L 101  103  103   CO2 22 - 32 mmol/L 32  33  31   Calcium 8.9 - 10.3 mg/dL 8.9  8.7  8.7   Total Protein 6.5 - 8.1 g/dL 6.6  6.7  6.6   Total Bilirubin 0.0 - 1.2 mg/dL 0.6  0.4  0.5   Alkaline Phos 38 -  126 U/L 76  92  76   AST 15 - 41 U/L 18  17  18    ALT 0 - 44 U/L 15  16  18     . Lab Results  Component Value Date   LDH 161 10/19/2023          RADIOGRAPHIC STUDIES: I have personally reviewed the radiological images as listed and agreed with the findings in the report. No results found.   PET Lymphoma Hodgkins (with/ low dose CT) ,  07/22/17 IMPRESSION: 1. Mildly hypermetabolic bilateral pulmonary airspace opacities that are favored to represent infection versus complications of stem cell transplant. Follow-up with diagnostic CT may be helpful to distinguish. 2. No FDG avid malignant disease at present. Ancillary CT findings as above.  04/14/18 CT C/A/P and NECK:        10/13/18 CT C/A/P and Neck:     04/13/2019   04/13/2019   ASSESSMENT & PLAN:   68 y.o. 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   PET/CT scan shows bulky hypermetabolic lymphadenopathy throughout the neck, chest, and abdomen. 1 cm hypermetabolic pelvic lymph node in right external iliac chain. Hypermetabolic masslike opacity in central right lung with multiple adjacent hypermetabolic nodules. This is suspicious for pulmonary involvement by lymphoma  Fatigue anorexia or night sweats-drenching, and weight loss suggest constitutional type B symptoms.  Hepatitis profile and HIV neg  Patient is s/p 5 cycles of CHOEP tolerated well overall and received CHOP for 6th cycle due to significant cytopenias after 5th cycle of CHOEP. PET/CT on 01/08/2017 - showed near completely resolved LNadenopathy.  She received her autologous stem cell transplant with Dr Annia Belt [facility] on 04/13/2017 which she has tolerated well and has recovered well with resolved cytopenias.  -PET scan on 07/22/2017 with results of: No FDG avid malignant disease at present.  04/14/18 CT C/A/P and Neck revealed No evidence of disease return of progression    #2 history of PVCs -On chronic beta blocker therapy.  #3 history of sleep apnea -continue CPAP use   PLAN:  -Discussed lab results from today, 10/19/2023, in detail with the patient. CBC shows low slightly low Platelets of 146 K. CMP was stable overall. LDH in the normal range at 161. -Patient has no lab or clinical  evidence of progression/recurrence of her anaplastic T-cell lymphoma at this time -Continues to be in remission.  -continue to follow with PCP regularly.  -Answered all of patient's questions.   FOLLOW UP: RTC with Dr Candise Che with labs in 12 months  The total time spent in the appointment was 20 minutes* .  All of the patient's questions were answered with apparent satisfaction. The patient knows to call the clinic with any problems, questions or concerns.   Wyvonnia Lora MD Denise AAHIVMS [facility] Hematology/Oncology Physician [facility]  .*Total Encounter Time as defined by the Centers for Medicare and Medicaid Services includes, in addition to the face-to-face time of a patient visit (documented in the note above) non-face-to-face time: obtaining and reviewing outside history, ordering and reviewing medications, tests or procedures, care coordination (communications with other health care professionals or caregivers) and documentation in the medical record.   I,Param Shah,acting as a Neurosurgeon for Wyvonnia Lora, MD.,have documented all relevant documentation on the behalf of Wyvonnia Lora, MD,as directed by  Wyvonnia Lora, MD while in the presence of Wyvonnia Lora, MD.  .I  have reviewed the above documentation for accuracy and completeness, and I agree with the above. Johney Maine MD

## 2023-10-20 DIAGNOSIS — H2511 Age-related nuclear cataract, right eye: Secondary | ICD-10-CM | POA: Diagnosis not present

## 2023-10-21 DIAGNOSIS — H2512 Age-related nuclear cataract, left eye: Secondary | ICD-10-CM | POA: Diagnosis not present

## 2023-10-25 ENCOUNTER — Telehealth: Payer: Self-pay | Admitting: *Deleted

## 2023-10-25 NOTE — Telephone Encounter (Signed)
 Copied from CRM [id number]. Topic: Clinical - Medication Question >> Oct 25, 2023  9:05 AM Alessandra Bevels wrote: Reason for CRM: Patient is calling to request medication dosage increase from tirzepatide [facility]) 5 MG/0.5ML Pen [[id number]]  to 7.5mg . Patient reporting that the she is going to the gym 3 days a week. Please advise  [place] [facility] [address] [place] [address] Phone: [phone] Fax: [phone] Hours: M-F 7:30am-6pm   Is this okay?

## 2023-10-26 ENCOUNTER — Other Ambulatory Visit (HOSPITAL_COMMUNITY): Payer: Self-pay

## 2023-10-26 MED ORDER — TIRZEPATIDE 7.5 MG/0.5ML ~~LOC~~ SOAJ
7.5000 mg | SUBCUTANEOUS | 0 refills | Status: DC
  Filled 2023-10-26: qty 2, 28d supply, fill #0
  Filled 2023-10-27 – 2023-10-28 (×2): qty 6, 84d supply, fill #0

## 2023-10-27 ENCOUNTER — Other Ambulatory Visit (HOSPITAL_COMMUNITY): Payer: Self-pay

## 2023-10-27 ENCOUNTER — Telehealth: Payer: Self-pay | Admitting: Internal Medicine

## 2023-10-27 ENCOUNTER — Telehealth: Payer: Self-pay

## 2023-10-27 NOTE — Telephone Encounter (Unsigned)
 Copied from CRM [id number]. Topic: Clinical - Prescription Issue >> Oct 27, 2023 11:24 AM DeAngela L wrote: Reason for CRM: Patient called about the tirzepatide [facility]) 7.5 MG/0.5ML Pen Prescription, she states the pharm is sending another Pre Auth for but it has to be submitted with state health plan card and not the medicare card, please call the patient at [phone]

## 2023-10-27 NOTE — Telephone Encounter (Signed)
 Pharmacy Patient Advocate Encounter   Received notification from Physician's Office that prior authorization for Mounjaro 7.5MG /0.5ML auto-injectors is required/requested.   Insurance verification completed.   The patient is insured through [facility] .   Per test claim: PA required; PA submitted to above mentioned insurance via CoverMyMeds Key/confirmation #/EOC (Key: [name]) Status is pending

## 2023-10-28 ENCOUNTER — Other Ambulatory Visit (HOSPITAL_COMMUNITY): Payer: Self-pay

## 2023-10-28 NOTE — Telephone Encounter (Signed)
 Pharmacy Patient Advocate Encounter  Received notification from [facility] that Prior Authorization for Mounjaro 7.5MG /0.5ML auto-injectors has been APPROVED from 3.19.25 to 3.19.28. Ran test claim, Copay is $47.00. This test claim was processed through [facility]- copay amounts may vary at other pharmacies due to pharmacy/plan contracts, or as the patient moves through the different stages of their insurance plan.   PA #/Case ID/Reference #: ([id number])

## 2023-10-29 ENCOUNTER — Other Ambulatory Visit (HOSPITAL_COMMUNITY): Payer: Self-pay

## 2023-11-15 ENCOUNTER — Other Ambulatory Visit: Payer: Self-pay | Admitting: Family Medicine

## 2023-11-15 DIAGNOSIS — Z638 Other specified problems related to primary support group: Secondary | ICD-10-CM

## 2023-11-24 DIAGNOSIS — H2513 Age-related nuclear cataract, bilateral: Secondary | ICD-10-CM | POA: Diagnosis not present

## 2023-11-24 DIAGNOSIS — H43393 Other vitreous opacities, bilateral: Secondary | ICD-10-CM | POA: Diagnosis not present

## 2023-11-24 DIAGNOSIS — H16141 Punctate keratitis, right eye: Secondary | ICD-10-CM | POA: Diagnosis not present

## 2023-11-30 ENCOUNTER — Encounter: Payer: Self-pay | Admitting: Family Medicine

## 2023-11-30 DIAGNOSIS — L659 Nonscarring hair loss, unspecified: Secondary | ICD-10-CM

## 2023-12-01 DIAGNOSIS — H2512 Age-related nuclear cataract, left eye: Secondary | ICD-10-CM | POA: Diagnosis not present

## 2023-12-01 DIAGNOSIS — H2513 Age-related nuclear cataract, bilateral: Secondary | ICD-10-CM | POA: Diagnosis not present

## 2023-12-01 MED ORDER — MINOXIDIL 2 % EX SOLN: Freq: Two times a day (BID) | CUTANEOUS | 5 refills | Status: AC

## 2023-12-03 ENCOUNTER — Other Ambulatory Visit (HOSPITAL_COMMUNITY): Payer: Self-pay

## 2023-12-07 ENCOUNTER — Other Ambulatory Visit: Payer: Self-pay | Admitting: Family Medicine

## 2023-12-07 NOTE — Telephone Encounter (Signed)
 Is this okay to refill?

## 2023-12-22 ENCOUNTER — Other Ambulatory Visit: Payer: Self-pay | Admitting: Family Medicine

## 2023-12-22 DIAGNOSIS — I10 Essential (primary) hypertension: Secondary | ICD-10-CM

## 2023-12-22 NOTE — Progress Notes (Signed)
   12/22/2023  Patient ID: Denise [name], female   DOB: April 12, 1956, 68 y.o.   MRN: [id number]  Pharmacy Quality Measure Review  This patient is appearing on a report for being at risk of failing the adherence measure for diabetes medications this calendar year.   Medication: Mounjaro  Last fill date: 10/29/23 for 84 day supply  Insurance report was not up to date. No action needed at this time.   Carnell Christian, PharmD Clinical Pharmacist [phone]

## 2024-01-10 ENCOUNTER — Other Ambulatory Visit: Payer: Self-pay | Admitting: Family Medicine

## 2024-01-10 DIAGNOSIS — I10 Essential (primary) hypertension: Secondary | ICD-10-CM

## 2024-01-21 NOTE — Progress Notes (Signed)
   01/21/2024  Patient ID: Denise [name], female   DOB: 12-Oct-1955, 68 y.o.   MRN: [id number]  Pharmacy Quality Measure Review  This patient is appearing on a report for being at risk of failing the adherence measure for cholesterol (statin) medications this calendar year.   Medication: Atorvastatin  Last fill date: 11/15/23 for 90 day supply  Insurance report was not up to date. No action needed at this time.   Carnell Christian, PharmD Clinical Pharmacist [phone]

## 2024-01-24 ENCOUNTER — Other Ambulatory Visit: Payer: Self-pay | Admitting: Family Medicine

## 2024-01-24 DIAGNOSIS — J4530 Mild persistent asthma, uncomplicated: Secondary | ICD-10-CM

## 2024-01-25 ENCOUNTER — Other Ambulatory Visit (HOSPITAL_COMMUNITY): Payer: Self-pay

## 2024-01-25 ENCOUNTER — Other Ambulatory Visit: Payer: Self-pay | Admitting: *Deleted

## 2024-01-25 ENCOUNTER — Telehealth: Payer: Self-pay

## 2024-01-25 ENCOUNTER — Telehealth: Payer: Self-pay | Admitting: *Deleted

## 2024-01-25 ENCOUNTER — Encounter: Payer: Self-pay | Admitting: Family Medicine

## 2024-01-25 ENCOUNTER — Ambulatory Visit: Payer: Medicare HMO | Admitting: Family Medicine

## 2024-01-25 VITALS — BP 118/70 | HR 58 | Ht 63.0 in | Wt 162.8 lb

## 2024-01-25 DIAGNOSIS — E1169 Type 2 diabetes mellitus with other specified complication: Secondary | ICD-10-CM | POA: Diagnosis not present

## 2024-01-25 DIAGNOSIS — I493 Ventricular premature depolarization: Secondary | ICD-10-CM

## 2024-01-25 DIAGNOSIS — Z9484 Stem cells transplant status: Secondary | ICD-10-CM

## 2024-01-25 DIAGNOSIS — M1990 Unspecified osteoarthritis, unspecified site: Secondary | ICD-10-CM

## 2024-01-25 DIAGNOSIS — Z0000 Encounter for general adult medical examination without abnormal findings: Secondary | ICD-10-CM

## 2024-01-25 DIAGNOSIS — I10 Essential (primary) hypertension: Secondary | ICD-10-CM | POA: Diagnosis not present

## 2024-01-25 DIAGNOSIS — G4730 Sleep apnea, unspecified: Secondary | ICD-10-CM | POA: Diagnosis not present

## 2024-01-25 DIAGNOSIS — E669 Obesity, unspecified: Secondary | ICD-10-CM | POA: Diagnosis not present

## 2024-01-25 DIAGNOSIS — N181 Chronic kidney disease, stage 1: Secondary | ICD-10-CM | POA: Diagnosis not present

## 2024-01-25 DIAGNOSIS — H25012 Cortical age-related cataract, left eye: Secondary | ICD-10-CM | POA: Diagnosis not present

## 2024-01-25 DIAGNOSIS — K219 Gastro-esophageal reflux disease without esophagitis: Secondary | ICD-10-CM | POA: Diagnosis not present

## 2024-01-25 DIAGNOSIS — J4520 Mild intermittent asthma, uncomplicated: Secondary | ICD-10-CM

## 2024-01-25 DIAGNOSIS — E785 Hyperlipidemia, unspecified: Secondary | ICD-10-CM | POA: Diagnosis not present

## 2024-01-25 DIAGNOSIS — E1122 Type 2 diabetes mellitus with diabetic chronic kidney disease: Secondary | ICD-10-CM

## 2024-01-25 DIAGNOSIS — F419 Anxiety disorder, unspecified: Secondary | ICD-10-CM | POA: Diagnosis not present

## 2024-01-25 DIAGNOSIS — Z8572 Personal history of non-Hodgkin lymphomas: Secondary | ICD-10-CM

## 2024-01-25 DIAGNOSIS — R6889 Other general symptoms and signs: Secondary | ICD-10-CM

## 2024-01-25 DIAGNOSIS — J301 Allergic rhinitis due to pollen: Secondary | ICD-10-CM

## 2024-01-25 DIAGNOSIS — E119 Type 2 diabetes mellitus without complications: Secondary | ICD-10-CM

## 2024-01-25 DIAGNOSIS — J4530 Mild persistent asthma, uncomplicated: Secondary | ICD-10-CM

## 2024-01-25 LAB — POCT GLYCOSYLATED HEMOGLOBIN (HGB A1C): Hemoglobin A1C: 6.1 % — AB (ref 4.0–5.6)

## 2024-01-25 MED ORDER — ALBUTEROL SULFATE HFA 108 (90 BASE) MCG/ACT IN AERS: 2.0000 | INHALATION_SPRAY | Freq: Four times a day (QID) | RESPIRATORY_TRACT | 0 refills | Status: AC | PRN

## 2024-01-25 MED ORDER — HYDROCHLOROTHIAZIDE 12.5 MG PO CAPS: 12.5000 mg | ORAL_CAPSULE | Freq: Every day | ORAL | 2 refills | Status: DC

## 2024-01-25 MED ORDER — ATENOLOL 50 MG PO TABS: 50.0000 mg | ORAL_TABLET | Freq: Two times a day (BID) | ORAL | 1 refills | Status: DC

## 2024-01-25 MED ORDER — ATORVASTATIN CALCIUM 20 MG PO TABS
20.0000 mg | ORAL_TABLET | Freq: Every day | ORAL | 3 refills | Status: AC
Start: 2024-01-25 — End: ?

## 2024-01-25 MED ORDER — TIRZEPATIDE 7.5 MG/0.5ML ~~LOC~~ SOAJ
7.5000 mg | SUBCUTANEOUS | 0 refills | Status: DC
  Filled 2024-01-25: qty 6, 84d supply, fill #0

## 2024-01-25 MED ORDER — TIRZEPATIDE 7.5 MG/0.5ML ~~LOC~~ SOAJ
7.5000 mg | SUBCUTANEOUS | 0 refills | Status: DC
Start: 2024-01-25 — End: 2024-01-25

## 2024-01-25 NOTE — Telephone Encounter (Signed)
 I have received a Prior Authorization requesr from the pts pref'd pharmacy. For Rx Fluticasone -Salmeterol 100-50MCG/ACT aerosol powder. No P/A was/is needed. The Pharmacy was informed to fill the Rx via the pts aetna-medicare plan and the cost is 12.00 for Fluticasone -Salmeterol 100-50MCG/ACT aerosol powder 60 each dispensing pack

## 2024-01-25 NOTE — Telephone Encounter (Signed)
 Copied from CRM [id number]. Topic: Clinical - Prescription Issue >> Jan 25, 2024  1:50 PM Donald Frost wrote: Reason for CRM: The patient called in stating her little pharmacy doesn't carry the tirzepatide  (MOUNJARO ) 7.5 MG/0.5ML Pen she needs that prescription to be called into   [place] [facility]  Phone: [phone] Fax: [phone]  Please assist patient further  Medications sent to [facility], patient informed.

## 2024-01-25 NOTE — Progress Notes (Signed)
 Denise [name] is a 68 y.o. female who presents for annual wellness visit and follow-up on chronic medical conditions.  She has continued to do quite nicely on Mounjaro .  She has lost roughly 30 pounds with that.  She has more energy and stamina and is also noted improvement in her OSA symptoms.  Also less arthritic symptoms.  She does complain of cold intolerance but no skin or hair changes, GI issues.  She apparently has not snoring is much and does have more energy.  She continues on her asthma medications having no difficulty with that.  She is not having any reflux type symptoms.  Her allergies are under good control.  She does occasionally use Xanax  to help when she becomes more anxious and she would like to be. Immunizations and Health Maintenance Immunization History  Administered Date(s) Administered   DTaP / IPV 10/21/2017, 01/13/2018, 04/14/2018   Fluad Quad(high Dose 65+) 04/19/2023   Fluzone Influenza virus vaccine,trivalent (IIV3), split virus 08/05/1999, 04/23/2000, 04/02/2011   HIB (PRP-OMP) 10/21/2017, 01/13/2018, 04/14/2018   HIB (PRP-T) 10/21/2017, 01/13/2018, 04/14/2018   Hepatitis B 10/21/2017, 01/13/2018, 10/13/2018   Influenza Split 04/19/2019   Influenza Whole 08/05/1999, 04/23/2000, 04/02/2011   Influenza, Seasonal, Injecte, Preservative Fre 08/13/2012   Influenza-Unspecified 08/13/2012, 06/29/2014, 10/21/2017, 04/19/2019, 05/10/2022   MMR 04/13/2019   PFIZER Comirnaty(Gray Top)Covid-19 Tri-Sucrose Vaccine 12/27/2020   PFIZER(Purple Top)SARS-COV-2 Vaccination 10/06/2019, 10/27/2019, 05/25/2020   PNEUMOCOCCAL CONJUGATE-20 09/21/2023   Pfizer Covid-19 Vaccine Bivalent Booster 15yrs & up 05/28/2021, 01/06/2022   Pfizer(Comirnaty)Fall Seasonal Vaccine 12 years and older 04/19/2023   Pneumococcal Conjugate-13 10/21/2017, 01/13/2018, 04/14/2018   Pneumococcal Polysaccharide-23 10/13/2018   RSV IGIV 05/10/2022   Td 04/14/1991, 02/03/1999   Td (Adult),5 Lf Tetanus Toxid,  Preservative Free 04/14/1991, 02/03/1999   Tdap 04/02/2011, 12/27/2020   Unspecified SARS-COV-2 Vaccination 10/27/2019, 05/25/2020, 05/10/2022   Zoster Recombinant(Shingrix) 01/31/2019, 04/19/2019   Zoster, Unspecified 01/31/2019, 04/19/2019   Health Maintenance Due  Topic Date Due   FOOT EXAM  Never done   OPHTHALMOLOGY EXAM  Never done   Diabetic kidney evaluation - Urine ACR  Never done   Colonoscopy  03/04/2023   COVID-19 Vaccine (11 - Mixed Product risk 2024-25 season) 10/17/2023   Medicare Annual Wellness (AWV)  01/20/2024    Last Pap smear:  Last mammogram: 04/29/2023 Last colonoscopy: Last DEXA: 01/22/2022  Dentist: 01/25/2024 Ophtho: within the last year Exercise: 3 times a week at [facility]  Other doctors caring for patient include:Kale  Bevis  Advanced directives:? Does Patient Have a Medical Advance Directive?: Yes Type of Advance Directive: Living will, Healthcare Power of Attorney, Out of facility DNR (pink MOST or yellow form) Does patient want to make changes to medical advance directive?: Yes (Inpatient - patient defers changing a medical advance directive at this time - Information given) Copy of Healthcare Power of Attorney in Chart?: No - copy requested  Depression screen:  See questionnaire below.     01/25/2024    9:24 AM 01/20/2023    1:46 PM 01/06/2022    8:28 AM 12/27/2020   11:21 AM 05/10/2018    4:11 PM  Depression screen PHQ 2/9  Decreased Interest 0 0 0 0 0  Down, Depressed, Hopeless 0 0 0 1 0  PHQ - 2 Score 0 0 0 1 0    Fall Risk Screen: see questionnaire below.    01/25/2024    9:23 AM 01/20/2023    1:46 PM 01/06/2022    8:28 AM 12/27/2020   11:21 AM  05/10/2018    4:11 PM  Fall Risk   Falls in the past year? 0 0 0 0 No   Number falls in past yr: 0 0 0 0   Injury with Fall? 0 0 0 0   Risk for fall due to : No Fall Risks No Fall Risks No Fall Risks No Fall Risks   Follow up Falls evaluation completed Falls evaluation completed Falls evaluation  completed        Data saved with a previous flowsheet row definition    ADL screen:  See questionnaire below Functional Status Survey: Is the patient deaf or have difficulty hearing?: No Does the patient have difficulty seeing, even when wearing glasses/contacts?: No Does the patient have difficulty concentrating, remembering, or making decisions?: No Does the patient have difficulty walking or climbing stairs?: No Does the patient have difficulty dressing or bathing?: No Does the patient have difficulty doing errands alone such as visiting a doctor's office or shopping?: No   Review of Systems Constitutional: -, -unexpected weight change, -anorexia, -fatigue Allergy: -sneezing, -itching, -congestion Dermatology: denies changing moles, rash, lumps ENT: -runny nose, -ear pain, -sore throat,  Cardiology:  -chest pain, -palpitations, -orthopnea, Respiratory: -cough, -shortness of breath, -dyspnea on exertion, -wheezing,  Gastroenterology: -abdominal pain, -nausea, -vomiting, -diarrhea, -constipation, -dysphagia Hematology: -bleeding or bruising problems Musculoskeletal: -arthralgias, -myalgias, -joint swelling, -back pain, - Ophthalmology: -vision changes,  Urology: -dysuria, -difficulty urinating,  -urinary frequency, -urgency, incontinence Neurology: -, -numbness, , -memory loss, -falls, -dizziness    PHYSICAL EXAM:  BP 118/70   Pulse (!) 58   Ht 5' 3 (1.6 m)   Wt 162 lb 12.8 oz (73.8 kg)   LMP 04/25/1998 (Exact Date)   SpO2 99%   BMI 28.84 kg/m   General Appearance: Alert, cooperative, no distress, appears stated age Head: Normocephalic, without obvious abnormality, atraumatic Eyes: PERRL, conjunctiva/corneas clear, EOM's intact, Ears: Normal TM's and external ear canals Nose: Nares normal, mucosa normal, no drainage or sinus tenderness Throat: Lips, mucosa, and tongue normal; teeth and gums normal Neck: Supple, no lymphadenopathy;  thyroid :  no  enlargement/tenderness/nodules; no carotid bruit or JVD Lungs: Clear to auscultation bilaterally without wheezes, rales or ronchi; respirations unlabored Heart: Regular rate and rhythm, S1 and S2 normal, no murmur, rubor gallop Abdomen: Soft, non-tender, nondistended, normoactive bowel sounds,  no masses, no hepatosplenomegaly Skin:  Skin color, texture, turgor normal, no rashes or lesions Lymph nodes: Cervical, supraclavicular, and axillary nodes normal Neurologic:  CNII-XII intact, normal strength, sensation and gait; reflexes 2+ and symmetric throughout Psych: Normal mood, affect, hygiene and grooming. Hemoglobin A1c is 6.1 ASSESSMENT/PLAN: Sleep apnea, unspecified type  Obesity (BMI 30-39.9)  Type 2 diabetes mellitus with stage 1 chronic kidney disease, without long-term current use of insulin (HCC) - Plan: atenolol  (TENORMIN ) 50 MG tablet, POCT glycosylated hemoglobin (Hb A1C), tirzepatide  (MOUNJARO ) 7.5 MG/0.5ML Pen  History of diffuse large B-cell lymphoma  H/O autologous stem cell transplant (HCC)  Gastroesophageal reflux disease without esophagitis  Essential hypertension - Plan: atenolol  (TENORMIN ) 50 MG tablet, hydrochlorothiazide  (MICROZIDE ) 12.5 MG capsule  Arthritis  Anxiety  Seasonal allergic rhinitis due to pollen  PVC's (premature ventricular contractions)  Mild intermittent extrinsic asthma without complication  Hyperlipidemia associated with type 2 diabetes mellitus (HCC) - Plan: atorvastatin  (LIPITOR) 20 MG tablet, Lipid panel  Cold intolerance - Plan: TSH  Extrinsic asthma, mild persistent, uncomplicated - Plan: albuterol  (VENTOLIN  HFA) 108 (90 Base) MCG/ACT inhaler  Cortical age-related cataract of left eye Continue on present medication  regimen as well as follow-up with oncology, ophthalmology   Discussed monthly self breast exams and yearly mammograms; at least 30 minutes of aerobic activity at least 5 days/week and weight-bearing exercise 2x/week;   Immunization recommendations discussed.  Colonoscopy recommendations reviewed   Medicare Attestation I have personally reviewed: The patient's medical and social history Their use of alcohol, tobacco or illicit drugs Their current medications and supplements The patient's functional ability including ADLs,fall risks, home safety risks, cognitive, and hearing and visual impairment Diet and physical activities Evidence for depression or mood disorders  The patient's weight, height, and BMI have been recorded in the chart.  I have made referrals, counseling, and provided education to the patient based on review of the above and I have provided the patient with a written personalized care plan for preventive services.     Ron Cobbs, MD   01/25/2024

## 2024-01-26 ENCOUNTER — Ambulatory Visit: Payer: Self-pay | Admitting: Family Medicine

## 2024-01-26 LAB — LIPID PANEL
Chol/HDL Ratio: 2 ratio (ref 0.0–4.4)
Cholesterol, Total: 129 mg/dL (ref 100–199)
HDL: 63 mg/dL (ref >39)
LDL Chol Calc (NIH): 49 mg/dL (ref 0–99)
Triglycerides: 91 mg/dL (ref 0–149)
VLDL Cholesterol Cal: 17 mg/dL (ref 5–40)

## 2024-01-26 LAB — TSH: TSH: 2.31 u[IU]/mL (ref 0.450–4.500)

## 2024-02-15 ENCOUNTER — Other Ambulatory Visit: Payer: Self-pay | Admitting: Family Medicine

## 2024-02-15 DIAGNOSIS — Z638 Other specified problems related to primary support group: Secondary | ICD-10-CM

## 2024-03-04 ENCOUNTER — Other Ambulatory Visit: Payer: Self-pay | Admitting: Family Medicine

## 2024-03-04 DIAGNOSIS — J4530 Mild persistent asthma, uncomplicated: Secondary | ICD-10-CM

## 2024-03-17 ENCOUNTER — Other Ambulatory Visit: Payer: BC Managed Care – PPO

## 2024-03-17 ENCOUNTER — Ambulatory Visit: Payer: BC Managed Care – PPO | Admitting: Hematology

## 2024-03-20 ENCOUNTER — Other Ambulatory Visit: Payer: Self-pay | Admitting: Family Medicine

## 2024-04-14 DIAGNOSIS — C8478 Anaplastic large cell lymphoma, ALK-negative, lymph nodes of multiple sites: Secondary | ICD-10-CM | POA: Diagnosis not present

## 2024-04-14 DIAGNOSIS — R5383 Other fatigue: Secondary | ICD-10-CM | POA: Diagnosis not present

## 2024-04-14 DIAGNOSIS — Z9484 Stem cells transplant status: Secondary | ICD-10-CM | POA: Diagnosis not present

## 2024-05-04 DIAGNOSIS — Z1231 Encounter for screening mammogram for malignant neoplasm of breast: Secondary | ICD-10-CM | POA: Diagnosis not present

## 2024-05-04 LAB — HM MAMMOGRAPHY

## 2024-05-18 ENCOUNTER — Other Ambulatory Visit: Payer: Self-pay | Admitting: Family Medicine

## 2024-05-18 DIAGNOSIS — Z638 Other specified problems related to primary support group: Secondary | ICD-10-CM

## 2024-05-22 ENCOUNTER — Other Ambulatory Visit (HOSPITAL_COMMUNITY): Payer: Self-pay

## 2024-05-22 ENCOUNTER — Other Ambulatory Visit: Payer: Self-pay | Admitting: Family Medicine

## 2024-05-22 DIAGNOSIS — E1122 Type 2 diabetes mellitus with diabetic chronic kidney disease: Secondary | ICD-10-CM

## 2024-05-22 MED ORDER — MOUNJARO 7.5 MG/0.5ML ~~LOC~~ SOAJ
7.5000 mg | SUBCUTANEOUS | 1 refills | Status: DC
  Filled 2024-05-22: qty 6, 84d supply, fill #0
  Filled 2024-07-21: qty 2, 28d supply, fill #1

## 2024-05-23 ENCOUNTER — Other Ambulatory Visit (HOSPITAL_COMMUNITY): Payer: Self-pay

## 2024-07-10 ENCOUNTER — Other Ambulatory Visit: Payer: Self-pay | Admitting: Family Medicine

## 2024-07-10 DIAGNOSIS — I10 Essential (primary) hypertension: Secondary | ICD-10-CM

## 2024-07-10 DIAGNOSIS — Z638 Other specified problems related to primary support group: Secondary | ICD-10-CM

## 2024-07-10 NOTE — Telephone Encounter (Signed)
 Copied from CRM [id number]. Topic: Clinical - Medication Question >> Jul 10, 2024 11:39 AM Denise [name] wrote: Reason for CRM: Pt's pharm has sent in a med req. Pt is out of hydrochlorothiazide  (MICROZIDE ) 12.5 MG capsule and needs this medication. Advised pt I would send this as a high priority

## 2024-07-10 NOTE — Telephone Encounter (Signed)
 Last appt. 01/25/24

## 2024-07-21 ENCOUNTER — Other Ambulatory Visit (HOSPITAL_COMMUNITY): Payer: Self-pay

## 2024-07-26 ENCOUNTER — Encounter: Payer: Self-pay | Admitting: Family Medicine

## 2024-07-26 ENCOUNTER — Other Ambulatory Visit (HOSPITAL_COMMUNITY): Payer: Self-pay

## 2024-07-26 ENCOUNTER — Other Ambulatory Visit (HOSPITAL_BASED_OUTPATIENT_CLINIC_OR_DEPARTMENT_OTHER): Payer: Self-pay

## 2024-07-26 ENCOUNTER — Ambulatory Visit: Admitting: Family Medicine

## 2024-07-26 VITALS — BP 132/64 | HR 62 | Ht 63.0 in | Wt 155.2 lb

## 2024-07-26 DIAGNOSIS — G4730 Sleep apnea, unspecified: Secondary | ICD-10-CM | POA: Diagnosis not present

## 2024-07-26 DIAGNOSIS — Z8572 Personal history of non-Hodgkin lymphomas: Secondary | ICD-10-CM

## 2024-07-26 DIAGNOSIS — J301 Allergic rhinitis due to pollen: Secondary | ICD-10-CM | POA: Diagnosis not present

## 2024-07-26 DIAGNOSIS — M1990 Unspecified osteoarthritis, unspecified site: Secondary | ICD-10-CM

## 2024-07-26 DIAGNOSIS — I10 Essential (primary) hypertension: Secondary | ICD-10-CM

## 2024-07-26 DIAGNOSIS — J4520 Mild intermittent asthma, uncomplicated: Secondary | ICD-10-CM

## 2024-07-26 DIAGNOSIS — I493 Ventricular premature depolarization: Secondary | ICD-10-CM

## 2024-07-26 DIAGNOSIS — K219 Gastro-esophageal reflux disease without esophagitis: Secondary | ICD-10-CM

## 2024-07-26 DIAGNOSIS — J4530 Mild persistent asthma, uncomplicated: Secondary | ICD-10-CM

## 2024-07-26 DIAGNOSIS — E1122 Type 2 diabetes mellitus with diabetic chronic kidney disease: Secondary | ICD-10-CM

## 2024-07-26 DIAGNOSIS — E559 Vitamin D deficiency, unspecified: Secondary | ICD-10-CM

## 2024-07-26 DIAGNOSIS — N181 Chronic kidney disease, stage 1: Secondary | ICD-10-CM

## 2024-07-26 DIAGNOSIS — Z9484 Stem cells transplant status: Secondary | ICD-10-CM

## 2024-07-26 DIAGNOSIS — E669 Obesity, unspecified: Secondary | ICD-10-CM

## 2024-07-26 LAB — POCT GLYCOSYLATED HEMOGLOBIN (HGB A1C): Hemoglobin A1C: 5.5 % (ref 4.0–5.6)

## 2024-07-26 LAB — POCT UA - MICROALBUMIN
Albumin/Creatinine Ratio, Urine, POC: 12.8
Creatinine, POC: 39.1 mg/dL
Microalbumin Ur, POC: 5 mg/L

## 2024-07-26 MED ORDER — FLUTICASONE-SALMETEROL 100-50 MCG/ACT IN AEPB: INHALATION_SPRAY | RESPIRATORY_TRACT | 0 refills | Status: AC

## 2024-07-26 MED ORDER — ATENOLOL 50 MG PO TABS: 50.0000 mg | ORAL_TABLET | Freq: Two times a day (BID) | ORAL | 1 refills | Status: AC

## 2024-07-26 MED ORDER — MOUNJARO 7.5 MG/0.5ML ~~LOC~~ SOAJ
7.5000 mg | SUBCUTANEOUS | 1 refills | Status: AC
  Filled 2024-07-26 – 2024-08-06 (×2): qty 6, 84d supply, fill #0

## 2024-07-26 MED ORDER — LISINOPRIL 5 MG PO TABS: 5.0000 mg | ORAL_TABLET | Freq: Every day | ORAL | 3 refills | Status: AC

## 2024-07-26 MED ORDER — MOUNJARO 7.5 MG/0.5ML ~~LOC~~ SOAJ: 7.5000 mg | SUBCUTANEOUS | 1 refills | Status: DC

## 2024-07-26 NOTE — Progress Notes (Signed)
° °  Subjective:    Patient ID: Denise [name], female    DOB: 21-Jul-1956, 68 y.o.   MRN: [id number]  Discussed the use of AI scribe software for clinical note transcription with the patient, who gave verbal consent to proceed.  History of Present Illness   Denise [name] is a 68 year old female who presents for a medication check.  She manages her diabetes with Mounjaro  at a dose of 7.5 mg. Her last recorded A1c was 6.1, and she is unsure of her current A1c. She has lost weight, now weighing 155 pounds, down from 162 pounds in June. She is comfortable with her current weight and dietary habits, noting that she eats and drinks less than before.  She has a history of sleep apnea but has stopped using her CPAP machine after losing weight. She believes she is sleeping well   She has a history of asthma and uses a disk inhaler regularly but rarely needs her rescue inhaler. Her allergies are under control.  Her allergies are also under good control.  She has a history of PVCs and takes atenolol  to manage occasional palpitations, which she feels are well-controlled.  She reports a history of arthritis,  Her joint pain has improved with weight loss.  She mentions a history of vitamin D  deficiency and was previously prescribed vitamin D  supplements by another doctor; she is awaiting a new vitamin D  level check. She is awaiting a new vitamin D  level check.  She has a history of reflux, which is less frequent and less severe than before. She experiences acid indigestion only occasionally. She follows up regularly with her oncologist. She gets regular mammograms She engages in regular physical activity, going to the gym three times a week, which she finds beneficial both physically and socially. She plans to retire in June and values the social interaction and mental stimulation from her gym visits.           Review of Systems     Objective:    Physical Exam Physical Exam   VITALS: BP-  132/64 MEASUREMENTS: Weight- 155, BMI- 27.49.      Alert and in no distress. Tympanic membranes and canals are normal. Pharyngeal area is normal. Neck is supple without adenopathy or thyromegaly. Cardiac exam shows a regular sinus rhythm without murmurs or gallops. Lungs are clear to auscultation. Hemoglobin A1c is 5.5      Assessment & Plan:  Assessment and Plan    Type 2 diabetes mellitus with stage 1 chronic kidney disease Diabetes well-controlled with A1c of 5.5. Stage 1 CKD requires kidney protection. - Prescribed low-dose lisinopril  for kidney protection. - Continue Mounjaro  for diabetes management and weight control.  Sleep apnea Previously managed with CPAP, discontinued due to weight loss. Reports improved sleep and no snoring. - She did not want a home sleep study done.  Asthma Well-controlled with minimal use of rescue inhaler. - Renewed Advair prescription.  Premature ventricular contractions Managed with atenolol . Occasional palpitations occur, controlled by medication.  Essential hypertension Blood pressure well-controlled at 132/64 mmHg. Lisinopril  prescribed for kidney protection.  Vitamin D  deficiency Previously treated with vitamin D  supplementation. - Checked vitamin D  level.  History of diffuse large B-cell lymphoma Continues follow-up with hematology/oncology. Over 45 minutes spent discussing all the above issues with her.  General health maintenance Up to date on COVID, flu, RSV, and pneumonia vaccinations. Mammograms performed periodically. - Recommended Cologuard for colon cancer screening.

## 2024-07-26 NOTE — Addendum Note (Signed)
 Addended by: JOYCE NORLEEN BROCKS on: 07/26/2024 04:12 PM   Modules accepted: Orders

## 2024-07-27 ENCOUNTER — Ambulatory Visit: Payer: Self-pay | Admitting: Family Medicine

## 2024-07-27 ENCOUNTER — Encounter: Payer: Self-pay | Admitting: Family Medicine

## 2024-07-27 LAB — VITAMIN D 25 HYDROXY (VIT D DEFICIENCY, FRACTURES): Vit D, 25-Hydroxy: 37 ng/mL (ref 30.0–100.0)

## 2024-08-07 ENCOUNTER — Other Ambulatory Visit (HOSPITAL_COMMUNITY): Payer: Self-pay

## 2024-08-08 ENCOUNTER — Other Ambulatory Visit: Payer: Self-pay | Admitting: Family Medicine

## 2024-08-08 DIAGNOSIS — I10 Essential (primary) hypertension: Secondary | ICD-10-CM

## 2024-08-25 ENCOUNTER — Other Ambulatory Visit: Payer: Self-pay | Admitting: Family Medicine

## 2024-08-25 DIAGNOSIS — Z638 Other specified problems related to primary support group: Secondary | ICD-10-CM

## 2024-08-25 NOTE — Telephone Encounter (Signed)
 Last appt. 07/26/24.

## 2024-09-01 ENCOUNTER — Telehealth: Payer: Self-pay

## 2024-09-01 ENCOUNTER — Other Ambulatory Visit: Payer: Self-pay

## 2024-09-01 NOTE — Telephone Encounter (Signed)
 Called Patient and went over meds and when they were called in.   Copied from CRM #[id number]. Topic: Clinical - Medication Question >> Sep 01, 2024  2:00 PM Donna BRAVO wrote: Reason for CRM: Patient would like to speak with a nurse about Vitamin D .  On 07/26/24 insurance paid for Vitamin D , 25-Hydroxy Patient did not receive this medication. Was this medication called in.  Patient like a call  a call back for clarification  Patient was informed they will receive a call back by end of business day

## 2024-09-06 ENCOUNTER — Other Ambulatory Visit: Payer: Self-pay | Admitting: Family Medicine

## 2024-09-06 DIAGNOSIS — I10 Essential (primary) hypertension: Secondary | ICD-10-CM

## 2024-09-15 NOTE — Progress Notes (Signed)
 KAYLAH CHIASSON                                          MRN: [id number]   09/15/2024   The [facility] Quality Team Specialist reviewed this patient medical record for the purposes of chart review for care gap closure. The following were reviewed: abstraction for care gap closure-kidney health evaluation for diabetes:eGFR  and uACR.    [facility] Quality Team

## 2024-10-20 ENCOUNTER — Other Ambulatory Visit

## 2024-10-20 ENCOUNTER — Ambulatory Visit: Admitting: Hematology

## 2025-01-25 ENCOUNTER — Encounter: Payer: Self-pay | Admitting: Family Medicine
